# Patient Record
Sex: Female | Born: 1935 | Race: White | Hispanic: No | Smoking: Current every day smoker
Health system: Southern US, Community
[De-identification: ages and names within clinical notes are randomized; demographics above are authoritative.]

## PROBLEM LIST (undated history)

## (undated) DIAGNOSIS — F329 Major depressive disorder, single episode, unspecified: Secondary | ICD-10-CM

## (undated) DIAGNOSIS — I219 Acute myocardial infarction, unspecified: Secondary | ICD-10-CM

## (undated) DIAGNOSIS — I2510 Atherosclerotic heart disease of native coronary artery without angina pectoris: Secondary | ICD-10-CM

## (undated) DIAGNOSIS — Z951 Presence of aortocoronary bypass graft: Secondary | ICD-10-CM

## (undated) DIAGNOSIS — H3530 Unspecified macular degeneration: Secondary | ICD-10-CM

## (undated) DIAGNOSIS — R569 Unspecified convulsions: Secondary | ICD-10-CM

## (undated) DIAGNOSIS — J45909 Unspecified asthma, uncomplicated: Secondary | ICD-10-CM

## (undated) DIAGNOSIS — M1990 Unspecified osteoarthritis, unspecified site: Secondary | ICD-10-CM

## (undated) DIAGNOSIS — E785 Hyperlipidemia, unspecified: Secondary | ICD-10-CM

## (undated) DIAGNOSIS — R0902 Hypoxemia: Secondary | ICD-10-CM

## (undated) DIAGNOSIS — G40909 Epilepsy, unspecified, not intractable, without status epilepticus: Secondary | ICD-10-CM

## (undated) DIAGNOSIS — I10 Essential (primary) hypertension: Secondary | ICD-10-CM

## (undated) DIAGNOSIS — R251 Tremor, unspecified: Secondary | ICD-10-CM

## (undated) DIAGNOSIS — J449 Chronic obstructive pulmonary disease, unspecified: Secondary | ICD-10-CM

## (undated) DIAGNOSIS — E538 Deficiency of other specified B group vitamins: Secondary | ICD-10-CM

## (undated) DIAGNOSIS — K219 Gastro-esophageal reflux disease without esophagitis: Secondary | ICD-10-CM

## (undated) DIAGNOSIS — Z955 Presence of coronary angioplasty implant and graft: Secondary | ICD-10-CM

## (undated) DIAGNOSIS — F32A Depression, unspecified: Secondary | ICD-10-CM

## (undated) HISTORY — DX: Unspecified macular degeneration: H35.30

## (undated) HISTORY — DX: Depression, unspecified: F32.A

## (undated) HISTORY — PX: CORONARY ANGIOPLASTY WITH STENT PLACEMENT: SHX49

## (undated) HISTORY — PX: APPENDECTOMY: SHX54

## (undated) HISTORY — DX: Epilepsy, unspecified, not intractable, without status epilepticus: G40.909

## (undated) HISTORY — DX: Atherosclerotic heart disease of native coronary artery without angina pectoris: I25.10

## (undated) HISTORY — PX: ABDOMINAL HYSTERECTOMY: SHX81

## (undated) HISTORY — DX: Hypoxemia: R09.02

## (undated) HISTORY — PX: CARDIAC SURGERY: SHX584

## (undated) HISTORY — DX: Deficiency of other specified B group vitamins: E53.8

## (undated) HISTORY — DX: Tremor, unspecified: R25.1

## (undated) HISTORY — DX: Major depressive disorder, single episode, unspecified: F32.9

## (undated) HISTORY — DX: Essential (primary) hypertension: I10

## (undated) HISTORY — DX: Unspecified osteoarthritis, unspecified site: M19.90

## (undated) HISTORY — PX: ROTATOR CUFF REPAIR: SHX139

## (undated) HISTORY — DX: Gastro-esophageal reflux disease without esophagitis: K21.9

## (undated) HISTORY — DX: Hyperlipidemia, unspecified: E78.5

---

## 1960-02-13 HISTORY — PX: CHOLECYSTECTOMY: SHX55

## 2000-02-13 HISTORY — PX: VENTRAL HERNIA REPAIR: SHX424

## 2005-08-13 ENCOUNTER — Other Ambulatory Visit: Payer: Self-pay

## 2005-08-13 ENCOUNTER — Emergency Department: Payer: Self-pay | Admitting: Emergency Medicine

## 2006-03-20 ENCOUNTER — Ambulatory Visit: Payer: Self-pay | Admitting: Ophthalmology

## 2006-03-26 ENCOUNTER — Ambulatory Visit: Payer: Self-pay | Admitting: Ophthalmology

## 2008-04-22 ENCOUNTER — Inpatient Hospital Stay: Payer: Self-pay | Admitting: Internal Medicine

## 2009-07-15 ENCOUNTER — Ambulatory Visit: Payer: Self-pay | Admitting: Family Medicine

## 2009-07-29 ENCOUNTER — Ambulatory Visit: Payer: Self-pay | Admitting: Family Medicine

## 2011-10-22 DIAGNOSIS — Z23 Encounter for immunization: Secondary | ICD-10-CM | POA: Insufficient documentation

## 2012-09-04 LAB — HM DEXA SCAN

## 2013-05-13 HISTORY — PX: CARDIAC CATHETERIZATION: SHX172

## 2013-06-05 DIAGNOSIS — T420X1A Poisoning by hydantoin derivatives, accidental (unintentional), initial encounter: Secondary | ICD-10-CM | POA: Insufficient documentation

## 2013-06-16 DIAGNOSIS — R0902 Hypoxemia: Secondary | ICD-10-CM | POA: Insufficient documentation

## 2014-02-18 DIAGNOSIS — R251 Tremor, unspecified: Secondary | ICD-10-CM | POA: Insufficient documentation

## 2016-02-05 ENCOUNTER — Encounter: Payer: Self-pay | Admitting: Emergency Medicine

## 2016-02-05 ENCOUNTER — Emergency Department: Payer: Medicare HMO

## 2016-02-05 ENCOUNTER — Emergency Department
Admission: EM | Admit: 2016-02-05 | Discharge: 2016-02-05 | Disposition: A | Discharge status: Home / Self Care | Payer: Medicare HMO | Attending: Emergency Medicine | Admitting: Emergency Medicine

## 2016-02-05 DIAGNOSIS — R1310 Dysphagia, unspecified: Secondary | ICD-10-CM | POA: Diagnosis not present

## 2016-02-05 DIAGNOSIS — J449 Chronic obstructive pulmonary disease, unspecified: Secondary | ICD-10-CM | POA: Insufficient documentation

## 2016-02-05 DIAGNOSIS — Z87891 Personal history of nicotine dependence: Secondary | ICD-10-CM | POA: Diagnosis not present

## 2016-02-05 HISTORY — DX: Unspecified convulsions: R56.9

## 2016-02-05 HISTORY — DX: Chronic obstructive pulmonary disease, unspecified: J44.9

## 2016-02-05 HISTORY — DX: Presence of coronary angioplasty implant and graft: Z95.5

## 2016-02-05 HISTORY — DX: Presence of aortocoronary bypass graft: Z95.1

## 2016-02-05 HISTORY — DX: Acute myocardial infarction, unspecified: I21.9

## 2016-02-05 NOTE — ED Notes (Signed)

## 2016-02-05 NOTE — ED Notes (Signed)
Pt able to tolerate PO fluids at this time. Pt refuses food at this time, states she feels like the pill is gone and she just wants to go home. EDP notified.

## 2016-02-05 NOTE — ED Triage Notes (Addendum)
Pt presents to ED via [facility] from home. Pt states she took an Excedrin this morning and feels like it's stuck in her throat. States she usually crushes her pills but did not this morning. States she is unable to swallow water now and just spits it back up. O2 sat 97% on RA. No c/o difficulty breathing. Pt is not spitting out her saliva at this time.

## 2016-02-05 NOTE — ED Provider Notes (Signed)
[facility] Emergency Department Provider Note  ____________________________________________  Time seen: Approximately 9:03 AM  I have reviewed the triage vital signs and the nursing notes.   HISTORY  Chief Complaint Dysphagia    HPI Sandy Daniel is a 82 y.o. female who complains of foreign body sensation in her throat. She states that about 8:00 AM today she took an Excedrin, and feels like it got stuck. She feels the sensation right around the sternal notch in her throat. Normally she crushes her medications but she did not crush this pill. Otherwise in her usual state of health. No vomiting. She feels like she cannot swallow liquids. No chest pain or shortness of breath.     Past Medical History:  Diagnosis Date  . COPD (chronic obstructive pulmonary disease) (HCC)   . MI (myocardial infarction)   . Presence of stent of CABG    x2   . Seizures (HCC)      There are no active problems to display for this patient.    Past Surgical History:  Procedure Laterality Date  . CARDIAC SURGERY       Prior to Admission medications   Not on File   Allergies   Active Allergy Reactions Severity Noted Date Comments  Cortisone Rash Medium 07/13/2013   Fosamax Nausea And Vomiting Medium 01/15/2013   Ibuprofen   11/09/2010 Cause her to have blisters in her mouth   Nsaids Rash High 02/23/2013   Other Rash Medium 07/21/2013 Any Steriods. Causes mouth sores    Current Medications   Prescription Sig. Disp. Refills Start Date End Date Status  aspirin (ASPIRIN) 81 mg EC tablet  Take 81 mg by mouth daily.     Active  Cholecalciferol (VITAMIN D PO)  Take by mouth. 1000 units QD     Active  Probiotic Product (ALIGN) 4 MG CAPS  Take 1 capsule by mouth daily. 30 capsule  6 07/07/2013  Active  ipratropium-albuterol (COMBIVENT RESPIMAT) 20-100 MCG/ACT inhaler  Inhale 1 puff into the lungs 4 (four) times a day as needed. 1 Inhaler  11  05/24/2014  Active  nitroGLYCERIN (NITROSTAT) 0.4 mg SL tablet  Place 0.4 mg under the tongue every 5 (five) minutes as needed for Chest pain.     Active  PARoxetine HCl (PAXIL) 20 mg tablet  TAKE ONE TABLET BY MOUTH IN THE MORNING 90 tablet  1 11/23/2014  Active  butalbital-acetaminophen-caffeine (FIORICET, ESGIC) 50-325-40 mg per tablet  Indications: Tension Headache Take one tablet by mouth 2 (two) times a day as needed for Pain. 30 tablet  5 02/21/2015  Active  levETIRAcetam (KEPPRA) 500 mg tablet  Take one tablet (500 mg total) by mouth 2 (two) times daily. 720 tablet  0 04/11/2015 04/10/2016 Active  tiotropium-olodaterol (STIOLTO RESPIMAT) 2.5-2.5 mcg/actuation inhaler  Inhale 1 puff into the lungs daily.     Active  ondansetron (ZOFRAN) 4 mg tablet  Take 4 mg by mouth every 8 (eight) hours as needed for Nausea.     Active  Magnesium Hydroxide (MILK OF MAGNESIA PO)  Take by mouth as needed.      Active  lidocaine (LIDODERM) 5%  Place 1 patch onto the skin every 24 (twenty-four) hours as needed. Remove & Discard patch within 12 hours or as directed by MD      Active  Saline (DEEP SEA NASAL SPRAY NA)  by Nasal route.     Active  pravastatin sodium (PRAVACHOL) 20 mg tablet  Take one tablet (20 mg  total) by mouth daily. 30 tablet  2 06/06/2015  Active  famotidine (PEPCID) 20 MG tablet  Take one tablet (20 mg total) by mouth 2 (two) times daily. 60 tablet  2 06/06/2015  Active  ALPRAZolam (XANAX) 0.25 mg tablet  Take 0.25 mg by mouth 3 (three) times a day as needed.    05/05/2015  Active  ketoconazole (NIZORAL) 2 % cream  Apply topically daily.    06/02/2015  Active  phenytoin sodium (DILANTIN) 100 mg ER capsule  Take two capsules (200 mg total) by mouth at bedtime. 180 capsule  1 06/16/2015  Active  clobetasol (TEMOVATE) 0.05% external solution  Indications: Dermatitis USE ONE APPLICATION TOPICALLY TO AFFECTED AREA (S) TWICE DAILY AS NEEDED  FOR ITCHING. 50 mL  6 07/13/2015  Active  nystatin (MYCOSTATIN) 100000 UNIT/ML suspension  Indications: Dermatitis SWISH AND SWALLOW 5 ML'S BY MOUTH FOUR TIMES A DAY 180 mL  1 07/13/2015  Active  nystatin (MYCOSTATIN) cream  Apply topically 2 (two) times daily. 60 g  1 07/15/2015 07/14/2016 Active  carvedilol (COREG) 3.125 mg tablet  TAKE ONE TABLET BY MOUTH TWICE DAILY 60 tablet  2 07/21/2015  Active  oxyCODONE HCl (ROXICODONE) 5 mg immediate release tablet  Take one tablet (5 mg total) by mouth every 8 (eight) hours as needed for Pain. 30 tablet  0 07/27/2015  Active  Misc. Devices MISC  Sandy Daniel,DOB July 14, 2028, is to be admitted to skilled nursing facility on 08[facility]12[facility]: H35.30, J44.9, G40.909 1 each  0 08/11/2015  Active  Misc. Devices MISC  Sandy Daniel to be admitted to skilled nursing facility on August 12, 2015. Follow medications and diet per FL[facility] dated July 27, 2015.          Allergies Motrin [ibuprofen]   History reviewed. No pertinent family history.  Social History Social History  Substance Use Topics  . Smoking status: Former Smoker    Years: 50.00  . Smokeless tobacco: Never Used  . Alcohol use No    Review of Systems  Constitutional:   No fever or chills.  ENT:   Positive throat pain and foreign body sensation. Cardiovascular:   No chest pain. Respiratory:   No dyspnea or cough. Gastrointestinal:   Negative for abdominal pain, vomiting and diarrhea.  Genitourinary:   Negative for dysuria or difficulty urinating. Musculoskeletal:   Negative for focal pain or swelling Neurological:   Negative for headaches 10-point ROS otherwise negative.  ____________________________________________   PHYSICAL EXAM:  VITAL SIGNS: ED Triage Vitals  Enc Vitals Group     BP 02/05/16 0821 (!) 145/88     Pulse Rate 02/05/16 0821 76     Resp 02/05/16 0821 20     Temp 02/05/16 0821 97.8 F (36.6 C)     Temp Source  02/05/16 0821 Oral     SpO2 02/05/16 0821 96 %     Weight 02/05/16 0822 191 lb 3.2 oz (86.7 kg)     Height 02/05/16 0822 5\' 1"  (1.549 m)     Head Circumference --      Peak Flow --      Pain Score --      Pain Loc --      Pain Edu? --      Excl. in GC? --     Vital signs reviewed, nursing assessments reviewed.   Constitutional:   Alert and oriented. Well appearing and in no distress. Eyes:   No scleral icterus. No conjunctival  pallor. PERRL. EOMI.  No nystagmus. ENT   Head:   Normocephalic and atraumatic.   Nose:   No congestion/rhinnorhea. No septal hematoma   Mouth/Throat:   MMM, no pharyngeal erythema. No peritonsillar mass. No erythema or swelling or visualized foreign body in the oropharynx patient oropharynx visualized as inferiorly as possible with patient in the sniffing position, unable to see the epiglottis.   Neck:   No stridor. No SubQ emphysema. No meningismus. Hematological/Lymphatic/Immunilogical:   No cervical lymphadenopathy. Cardiovascular:   RRR. Symmetric bilateral radial and DP pulses.  No murmurs.  Respiratory:   Normal respiratory effort without tachypnea nor retractions. Breath sounds are clear and equal bilaterally. No wheezes/rales/rhonchi. Gastrointestinal:   Soft and nontender. Non distended. There is no CVA tenderness.  No rebound, rigidity, or guarding. Genitourinary:   deferred Musculoskeletal:   Nontender with normal range of motion in all extremities. No joint effusions.  No lower extremity tenderness.  No edema. Neurologic:   Normal speech and language.  CN 2-10 normal. Motor grossly intact. No gross focal neurologic deficits are appreciated.  Skin:    Skin is warm, dry and intact. No rash noted.  No petechiae, purpura, or bullae.  ____________________________________________    LABS (pertinent positives/negatives) (all labs ordered are listed, but only abnormal results are displayed) Labs Reviewed - No data to  display ____________________________________________   EKG    ____________________________________________    RADIOLOGY  X-rays soft tissue neck unremarkable X-ray chest unremarkable  ____________________________________________   PROCEDURES Procedures  ____________________________________________   INITIAL IMPRESSION / ASSESSMENT AND PLAN / ED COURSE  Pertinent labs & imaging results that were available during my care of the patient were reviewed by me and considered in my medical decision making (see chart for details).  Patient presents to foreign body sensation after swallowing a pill. We'll get x-rays, and 2 by mouth trial to try and resolve any obstruction.     Clinical Course    ___________________  ----------------------------------------- 10:37 AM on 02/05/2016 -----------------------------------------  Patient feels better. Tolerating oral intake. We'll discharge home.   _________________________   FINAL CLINICAL IMPRESSION(S) / ED DIAGNOSES  Final diagnoses:  Dysphagia, unspecified type      New Prescriptions   No medications on file     Portions of this note were generated with dragon dictation software. Dictation errors may occur despite best attempts at proofreading.    Sharman Cheek, MD 02/05/16 1037

## 2016-02-05 NOTE — ED Notes (Signed)
Pt given applesauce per family request.

## 2016-02-17 DIAGNOSIS — E782 Mixed hyperlipidemia: Secondary | ICD-10-CM

## 2016-02-17 DIAGNOSIS — I10 Essential (primary) hypertension: Secondary | ICD-10-CM

## 2016-02-17 DIAGNOSIS — F32A Depression, unspecified: Secondary | ICD-10-CM | POA: Insufficient documentation

## 2016-02-17 DIAGNOSIS — I219 Acute myocardial infarction, unspecified: Secondary | ICD-10-CM | POA: Insufficient documentation

## 2016-02-17 DIAGNOSIS — I2510 Atherosclerotic heart disease of native coronary artery without angina pectoris: Secondary | ICD-10-CM | POA: Insufficient documentation

## 2016-02-17 DIAGNOSIS — J449 Chronic obstructive pulmonary disease, unspecified: Secondary | ICD-10-CM | POA: Insufficient documentation

## 2016-02-17 DIAGNOSIS — H3530 Unspecified macular degeneration: Secondary | ICD-10-CM

## 2016-02-17 DIAGNOSIS — F329 Major depressive disorder, single episode, unspecified: Secondary | ICD-10-CM | POA: Insufficient documentation

## 2016-02-17 DIAGNOSIS — E785 Hyperlipidemia, unspecified: Secondary | ICD-10-CM | POA: Insufficient documentation

## 2016-02-17 DIAGNOSIS — K219 Gastro-esophageal reflux disease without esophagitis: Secondary | ICD-10-CM | POA: Insufficient documentation

## 2016-02-17 DIAGNOSIS — M1990 Unspecified osteoarthritis, unspecified site: Secondary | ICD-10-CM | POA: Insufficient documentation

## 2016-02-17 DIAGNOSIS — E538 Deficiency of other specified B group vitamins: Secondary | ICD-10-CM

## 2016-02-17 DIAGNOSIS — G40909 Epilepsy, unspecified, not intractable, without status epilepticus: Secondary | ICD-10-CM | POA: Insufficient documentation

## 2016-03-08 ENCOUNTER — Encounter: Payer: Self-pay | Admitting: Urgent Care

## 2016-03-08 DIAGNOSIS — I2510 Atherosclerotic heart disease of native coronary artery without angina pectoris: Secondary | ICD-10-CM | POA: Diagnosis not present

## 2016-03-08 DIAGNOSIS — I252 Old myocardial infarction: Secondary | ICD-10-CM | POA: Insufficient documentation

## 2016-03-08 DIAGNOSIS — J45909 Unspecified asthma, uncomplicated: Secondary | ICD-10-CM | POA: Diagnosis not present

## 2016-03-08 DIAGNOSIS — J449 Chronic obstructive pulmonary disease, unspecified: Secondary | ICD-10-CM | POA: Diagnosis not present

## 2016-03-08 DIAGNOSIS — R1013 Epigastric pain: Secondary | ICD-10-CM | POA: Diagnosis present

## 2016-03-08 DIAGNOSIS — I10 Essential (primary) hypertension: Secondary | ICD-10-CM | POA: Insufficient documentation

## 2016-03-08 DIAGNOSIS — Z87891 Personal history of nicotine dependence: Secondary | ICD-10-CM | POA: Diagnosis not present

## 2016-03-08 DIAGNOSIS — K5730 Diverticulosis of large intestine without perforation or abscess without bleeding: Secondary | ICD-10-CM | POA: Insufficient documentation

## 2016-03-08 LAB — URINALYSIS, COMPLETE (UACMP) WITH MICROSCOPIC
Bilirubin Urine: NEGATIVE
Glucose, UA: NEGATIVE mg/dL
Hgb urine dipstick: NEGATIVE
KETONES UR: NEGATIVE mg/dL
Leukocytes, UA: NEGATIVE
Nitrite: NEGATIVE
Protein, ur: NEGATIVE mg/dL
Specific Gravity, Urine: 1.003 — ABNORMAL LOW (ref 1.005–1.030)
pH: 7 (ref 5.0–8.0)

## 2016-03-08 LAB — CBC
HCT: 36 % (ref 35.0–47.0)
HEMOGLOBIN: 12.4 g/dL (ref 12.0–16.0)
MCH: 30.7 pg (ref 26.0–34.0)
MCHC: 34.4 g/dL (ref 32.0–36.0)
MCV: 89.3 fL (ref 80.0–100.0)
Platelets: 291 10*3/uL (ref 150–440)
RBC: 4.03 MIL/uL (ref 3.80–5.20)
RDW: 14.1 % (ref 11.5–14.5)
WBC: 5.2 10*3/uL (ref 3.6–11.0)

## 2016-03-08 LAB — COMPREHENSIVE METABOLIC PANEL
ALK PHOS: 159 U/L — AB (ref 38–126)
ALT: 14 U/L (ref 14–54)
AST: 23 U/L (ref 15–41)
Albumin: 3.8 g/dL (ref 3.5–5.0)
Anion gap: 9 (ref 5–15)
BUN: 16 mg/dL (ref 6–20)
CALCIUM: 8.8 mg/dL — AB (ref 8.9–10.3)
CO2: 26 mmol/L (ref 22–32)
CREATININE: 0.77 mg/dL (ref 0.44–1.00)
Chloride: 103 mmol/L (ref 101–111)
GFR calc non Af Amer: >60 mL/min (ref >60)
GLUCOSE: 104 mg/dL — AB (ref 65–99)
Potassium: 3.6 mmol/L (ref 3.5–5.1)
SODIUM: 138 mmol/L (ref 135–145)
Total Bilirubin: 0.3 mg/dL (ref 0.3–1.2)
Total Protein: 7.6 g/dL (ref 6.5–8.1)

## 2016-03-08 LAB — TROPONIN I

## 2016-03-08 LAB — LIPASE, BLOOD: Lipase: 25 U/L (ref 11–51)

## 2016-03-08 NOTE — ED Triage Notes (Addendum)
Patient presents with c/o upper abdominal pain and swelling that began x 4 days ago; worse yesterday. (+) nausea reported. Patient reports that the pain is under both of her breasts. (+) generalized weakness and a headache also reported.

## 2016-03-09 ENCOUNTER — Encounter: Payer: Self-pay | Admitting: Radiology

## 2016-03-09 ENCOUNTER — Emergency Department
Admission: EM | Admit: 2016-03-09 | Discharge: 2016-03-09 | Disposition: A | Discharge status: Home / Self Care | Payer: Medicare HMO | Attending: Emergency Medicine | Admitting: Emergency Medicine

## 2016-03-09 ENCOUNTER — Emergency Department: Payer: Medicare HMO

## 2016-03-09 DIAGNOSIS — R197 Diarrhea, unspecified: Secondary | ICD-10-CM

## 2016-03-09 DIAGNOSIS — K5730 Diverticulosis of large intestine without perforation or abscess without bleeding: Secondary | ICD-10-CM

## 2016-03-09 HISTORY — DX: Unspecified asthma, uncomplicated: J45.909

## 2016-03-09 MED ORDER — ONDANSETRON HCL 4 MG/2ML IJ SOLN
4.0000 mg | Freq: Once | INTRAMUSCULAR | Status: AC
  Administered 2016-03-09: 4 mg via INTRAVENOUS
  Filled 2016-03-09: qty 2

## 2016-03-09 MED ORDER — IOPAMIDOL (ISOVUE-300) INJECTION 61%
100.0000 mL | Freq: Once | INTRAVENOUS | Status: AC | PRN
  Administered 2016-03-09: 100 mL via INTRAVENOUS

## 2016-03-09 MED ORDER — IOPAMIDOL (ISOVUE-300) INJECTION 61%
30.0000 mL | Freq: Once | INTRAVENOUS | Status: AC | PRN
  Administered 2016-03-09: 30 mL via ORAL

## 2016-03-09 MED ORDER — MORPHINE SULFATE (PF) 2 MG/ML IV SOLN
2.0000 mg | Freq: Once | INTRAVENOUS | Status: AC
  Administered 2016-03-09: 2 mg via INTRAVENOUS
  Filled 2016-03-09: qty 1

## 2016-03-09 NOTE — ED Provider Notes (Signed)
[facility] Emergency Department Provider Note    First MD Initiated Contact with Patient 03/09/16 [phone]     (approximate)  I have reviewed the triage vital signs and the nursing notes.   HISTORY  Chief Complaint Abdominal Pain    HPI Sandy Daniel is a 83 y.o. female with bolus of chronic medical conditions presents to the emergency department with 4 day history of epigastric/lower quadrant abdominal pain. Patient denies any fever does however admit to nausea no vomiting. Patient denies any diarrhea or constipation.   Past Medical History:  Diagnosis Date  . Arthritis   . Asthma   . B12 deficiency   . CAD (coronary artery disease)   . COPD (chronic obstructive pulmonary disease) (HCC)   . Depression   . Epilepsy (HCC)   . GERD (gastroesophageal reflux disease)   . Hyperlipidemia   . Hypertension   . Hypoxemia   . Macular degeneration (senile) of retina   . Macular degeneration of both eyes   . MI (myocardial infarction) 09/2003, 05/2013  . Physiological tremor   . Presence of stent of CABG    x2   . Seizures [facility])     Patient Active Problem List   Diagnosis Date Noted  . CAD (coronary artery disease)   . Epilepsy (HCC)   . Macular degeneration (senile) of retina   . Macular degeneration of both eyes   . B12 deficiency   . COPD (chronic obstructive pulmonary disease) (HCC)   . Hypertension   . Hyperlipidemia   . Arthritis   . Depression   . GERD (gastroesophageal reflux disease)   . MI (myocardial infarction)     Past Surgical History:  Procedure Laterality Date  . ABDOMINAL HYSTERECTOMY    . APPENDECTOMY    . CARDIAC SURGERY    . CHOLECYSTECTOMY  1962  . CORONARY ANGIOPLASTY WITH STENT PLACEMENT  Aug. 2005 and March 2015  . ROTATOR CUFF REPAIR Right   . VENTRAL HERNIA REPAIR  2002    Prior to Admission medications   Not on File    Allergies Motrin [ibuprofen]  Family History  Problem Relation Age of Onset  .  Diabetes Mother   . Cancer Sister   . Diabetes Sister   . Heart disease Sister   . Heart attack Sister   . Hyperlipidemia Sister   . Hypertension Sister   . Cancer Brother   . Diabetes Brother   . Heart disease Brother   . Heart attack Brother   . Hyperlipidemia Brother   . Hypertension Brother     Social History Social History  Substance Use Topics  . Smoking status: Former Smoker    Years: 50.00  . Smokeless tobacco: Never Used  . Alcohol use No    Review of Systems Constitutional: No fever/chills Eyes: No visual changes. ENT: No sore throat. Cardiovascular: Denies chest pain. Respiratory: Denies shortness of breath. Gastrointestinal: Positive for abdominal pain and nausea Genitourinary: Negative for dysuria. Musculoskeletal: Negative for back pain. Skin: Negative for rash. Neurological: Negative for headaches, focal weakness or numbness.  10-point ROS otherwise negative.  ____________________________________________   PHYSICAL EXAM:  VITAL SIGNS: ED Triage Vitals  Enc Vitals Group     BP 03/08/16 2141 (!) 135/56     Pulse Rate 03/08/16 2141 64     Resp 03/08/16 2141 18     Temp 03/08/16 2141 98.1 F (36.7 C)     Temp Source 03/08/16 2141 Oral  SpO2 03/08/16 2141 94 %     Weight 03/08/16 2137 187 lb (84.8 kg)     Height 03/08/16 2137 5\' 1"  (1.549 m)     Head Circumference --      Peak Flow --      Pain Score 03/08/16 2137 9     Pain Loc --      Pain Edu? --      Excl. in GC? --     Constitutional: Alert and oriented. Well appearing and in no acute distress. Eyes: Conjunctivae are normal. PERRL. EOMI. Head: Atraumatic. Mouth/Throat: Mucous membranes are moist.  Oropharynx non-erythematous. Neck: No stridor.   Cardiovascular: Normal rate, regular rhythm. Good peripheral circulation. Grossly normal heart sounds. Respiratory: Normal respiratory effort.  No retractions. Lungs CTAB. Gastrointestinal: Epigastric/left upper quadrant tenderness to  palpation No distention.  Musculoskeletal: No lower extremity tenderness nor edema. No gross deformities of extremities. Neurologic:  Normal speech and language. No gross focal neurologic deficits are appreciated.  Skin:  Skin is warm, dry and intact. No rash noted.   ____________________________________________   LABS (all labs ordered are listed, but only abnormal results are displayed)  Labs Reviewed  COMPREHENSIVE METABOLIC PANEL - Abnormal; Notable for the following:       Result Value   Glucose, Bld 104 (*)    Calcium 8.8 (*)    Alkaline Phosphatase 159 (*)    All other components within normal limits  URINALYSIS, COMPLETE (UACMP) WITH MICROSCOPIC - Abnormal; Notable for the following:    Color, Urine COLORLESS (*)    APPearance CLEAR (*)    Specific Gravity, Urine 1.003 (*)    Bacteria, UA MANY (*)    Squamous Epithelial / LPF 6-30 (*)    All other components within normal limits  LIPASE, BLOOD  CBC  TROPONIN I   ____________________________________________  EKG  ED ECG REPORT I, Amaya N BROWN, the attending physician, personally viewed and interpreted this ECG.   Date: 03/09/2016  EKG Time: 9:42 PM  Rate: 62  Rhythm: Normal sinus rhythm with PACs  Axis: Normal  Intervals: Irregular PR interval  ST&T Change: None  ____________________________________________  RADIOLOGY I, Lenwood N BROWN, personally viewed and evaluated these images (plain radiographs) as part of my medical decision making, as well as reviewing the written report by the radiologist.  Ct Abdomen Pelvis W Contrast  Result Date: 03/09/2016 CLINICAL DATA:  83 year old female with history of mesh implant and appendectomy presenting with upper abdominal pain for past 4 days. Patient reports the pain is under both breasts. EXAM: CT ABDOMEN AND PELVIS WITH CONTRAST TECHNIQUE: Multidetector CT imaging of the abdomen and pelvis was performed using the standard protocol following bolus  administration of intravenous contrast. CONTRAST:  ISOVUE-300 IOPAMIDOL (ISOVUE-300) INJECTION 61% COMPARISON:  Abdominal CT dated 07/29/2009 FINDINGS: Lower chest: There is moderate cardiomegaly with biatrial enlargement. Multi vessel coronary vascular calcification as well as calcification of the mitral annulus. There are bibasilar linear atelectasis/ scarring. There is no intra-abdominal free air or free fluid. Hepatobiliary: Cholecystectomy. There is mild intrahepatic biliary ductal dilatation, likely post cholecystectomy. Subcentimeter right hepatic hypodense lesion (series 2, image 26) is too small to characterize but likely represents a cyst or hemangioma. The liver is otherwise unremarkable. Pancreas: Unremarkable. No pancreatic ductal dilatation or surrounding inflammatory changes. Spleen: Normal in size without focal abnormality. Adrenals/Urinary Tract: The adrenal glands appear unremarkable. There is mild bilateral renal parenchymal atrophy with cortical irregularity more prominent on the left. There is a 5.5  cm right renal interpolar cyst. A 1.5 cm hypodense lesion in the upper pole of the right kidney superior to the mid pole cyst (series 7 image 20) is indeterminate but likely represents a cyst as well. There is no hydronephrosis on either side. The visualized ureters and urinary bladder appear unremarkable. Stomach/Bowel: There is a small hiatal hernia. Oral contrast opacifies the stomach and multiple loops of small bowel traversing into the cecum. Loose stool noted throughout the colon compatible with diarrheal state. Correlation with clinical exam and stool cultures recommended. There is sigmoid diverticulosis without active inflammatory changes. There is postsurgical changes of partial sigmoid resection with anastomotic sutures. There is no evidence of bowel obstruction or active inflammation. Appendectomy. Vascular/Lymphatic: There is advanced aortoiliac atherosclerotic disease. The origins  of the celiac axis, SMA, IMA and the renal arteries remain patent. The SMV, splenic vein, and main portal vein are patent. No portal venous gas identified. There is a retroaortic left renal vein anatomy. There is no adenopathy. Reproductive: Hysterectomy. Other: Midline vertical anterior abdominal wall incisional scar. A ventral hernia repair mesh is noted. No inflammatory changes or fluid collection. No evidence of recurrent hernia. Musculoskeletal: There is osteopenia with multilevel degenerative changes of the spine. Multilevel disc desiccation with vacuum phenomena noted. Mild thoracolumbar levoscoliosis. No acute fracture. IMPRESSION: Diarrheal state. Correlation with clinical exam and stool cultures recommended. No evidence of bowel obstruction or active inflammation. Sigmoid diverticulosis. Cardiomegaly with multi vessel coronary vascular calcification. Aortoiliac atherosclerotic disease. Electronically Signed   By: Elgie Collard M.D.   On: 03/09/2016 04:38    _____ Procedures    INITIAL IMPRESSION / ASSESSMENT AND PLAN / ED COURSE  Pertinent labs & imaging results that were available during my care of the patient were reviewed by me and considered in my medical decision making (see chart for details).  83 year old female presenting emergency department for a history of abdominal pain and swelling company by nausea. CT scan of the abdomen and pelvis revealed diarrheal state as well as diverticulosis. Patient admits to taking laxatives every other day since undergoing hernia surgery. Patient is advised to discontinue laxative use unless constipated Patient pain completely resolve.      ____________________________________________  FINAL CLINICAL IMPRESSION(S) / ED DIAGNOSES  Final diagnoses:  Diarrhea, unspecified type  Diverticulosis of large intestine without hemorrhage     MEDICATIONS GIVEN DURING THIS VISIT:  Medications  morphine 2 MG/ML injection 2 mg (2 mg Intravenous  Given 03/09/16 0138)  ondansetron (ZOFRAN) injection 4 mg (4 mg Intravenous Given 03/09/16 0137)  iopamidol (ISOVUE-300) 61 % injection 30 mL (30 mLs Oral Contrast Given 03/09/16 0346)  ondansetron (ZOFRAN) injection 4 mg (4 mg Intravenous Given 03/09/16 0317)  iopamidol (ISOVUE-300) 61 % injection 100 mL (100 mLs Intravenous Contrast Given 03/09/16 0345)     NEW OUTPATIENT MEDICATIONS STARTED DURING THIS VISIT:  New Prescriptions   No medications on file    Modified Medications   No medications on file    Discontinued Medications   No medications on file     Note:  This document was prepared using Dragon voice recognition software and may include unintentional dictation errors.    Darci Current, MD 03/09/16 [phone]

## 2016-03-15 HISTORY — PX: CATARACT EXTRACTION: SUR2

## 2016-03-26 ENCOUNTER — Ambulatory Visit (INDEPENDENT_AMBULATORY_CARE_PROVIDER_SITE_OTHER): Payer: Medicare HMO | Admitting: Family Medicine

## 2016-03-26 ENCOUNTER — Encounter: Payer: Self-pay | Admitting: Family Medicine

## 2016-03-26 VITALS — BP 114/72 | HR 79 | Temp 98.4°F | Resp 18 | Ht 59.0 in | Wt 174.0 lb

## 2016-03-26 DIAGNOSIS — M1990 Unspecified osteoarthritis, unspecified site: Secondary | ICD-10-CM | POA: Diagnosis not present

## 2016-03-26 DIAGNOSIS — I10 Essential (primary) hypertension: Secondary | ICD-10-CM

## 2016-03-26 DIAGNOSIS — E538 Deficiency of other specified B group vitamins: Secondary | ICD-10-CM | POA: Diagnosis not present

## 2016-03-26 DIAGNOSIS — G40909 Epilepsy, unspecified, not intractable, without status epilepticus: Secondary | ICD-10-CM

## 2016-03-26 DIAGNOSIS — K219 Gastro-esophageal reflux disease without esophagitis: Secondary | ICD-10-CM | POA: Diagnosis not present

## 2016-03-26 DIAGNOSIS — I25119 Atherosclerotic heart disease of native coronary artery with unspecified angina pectoris: Secondary | ICD-10-CM

## 2016-03-26 DIAGNOSIS — R1310 Dysphagia, unspecified: Secondary | ICD-10-CM

## 2016-03-26 DIAGNOSIS — F329 Major depressive disorder, single episode, unspecified: Secondary | ICD-10-CM

## 2016-03-26 DIAGNOSIS — F32A Depression, unspecified: Secondary | ICD-10-CM

## 2016-03-26 DIAGNOSIS — E782 Mixed hyperlipidemia: Secondary | ICD-10-CM | POA: Diagnosis not present

## 2016-03-26 DIAGNOSIS — F410 Panic disorder [episodic paroxysmal anxiety] without agoraphobia: Secondary | ICD-10-CM

## 2016-03-26 DIAGNOSIS — H3530 Unspecified macular degeneration: Secondary | ICD-10-CM | POA: Diagnosis not present

## 2016-03-26 DIAGNOSIS — J449 Chronic obstructive pulmonary disease, unspecified: Secondary | ICD-10-CM | POA: Diagnosis not present

## 2016-03-26 MED ORDER — CARVEDILOL 3.125 MG PO TABS: 3.1250 mg | ORAL_TABLET | Freq: Every day | ORAL | 1 refills | Status: DC

## 2016-03-26 MED ORDER — QUETIAPINE FUMARATE 25 MG PO TABS: 25.0000 mg | ORAL_TABLET | Freq: Three times a day (TID) | ORAL | 1 refills | Status: DC

## 2016-03-26 MED ORDER — ALBUTEROL SULFATE (2.5 MG/3ML) 0.083% IN NEBU: INHALATION_SOLUTION | RESPIRATORY_TRACT | 3 refills | Status: DC

## 2016-03-26 MED ORDER — PHENYTOIN SODIUM EXTENDED 100 MG PO CAPS: 200.0000 mg | ORAL_CAPSULE | Freq: Every day | ORAL | 1 refills | Status: DC

## 2016-03-26 MED ORDER — FAMOTIDINE 20 MG PO TABS: 20.0000 mg | ORAL_TABLET | Freq: Every day | ORAL | 1 refills | Status: DC

## 2016-03-26 MED ORDER — NITROGLYCERIN 0.4 MG SL SUBL: 0.4000 mg | SUBLINGUAL_TABLET | SUBLINGUAL | 5 refills | Status: DC | PRN

## 2016-03-26 MED ORDER — SERTRALINE HCL 50 MG PO TABS: 50.0000 mg | ORAL_TABLET | Freq: Every day | ORAL | 1 refills | Status: DC

## 2016-03-26 MED ORDER — TIOTROPIUM BROMIDE-OLODATEROL 2.5-2.5 MCG/ACT IN AERS: 2.5000 | INHALATION_SPRAY | Freq: Every morning | RESPIRATORY_TRACT | 12 refills | Status: DC

## 2016-03-26 NOTE — Assessment & Plan Note (Signed)
Not under good control. Off her meds. Will get her restarted on the sertraline, starting at 25mg  and work up to 100mg  if needed. Recheck 1 month. Call with any concerns.

## 2016-03-26 NOTE — Assessment & Plan Note (Signed)
Stable. Lungs clear today. Will continue current regimen. Continue to monitor.

## 2016-03-26 NOTE — Assessment & Plan Note (Signed)
Has not had any seizures. Will check dilantin level. Referral to neurology made today.

## 2016-03-26 NOTE — Progress Notes (Signed)
BP 114/72 (BP Location: Left Arm, Patient Position: Sitting, Cuff Size: Normal)   Pulse 79   Temp 98.4 F (36.9 C) (Oral)   Resp 18   Ht 4\' 11"  (1.499 m)   Wt 174 lb (78.9 kg)   SpO2 93%   BMI 35.14 kg/m    Subjective:    Patient ID: Sandy Daniel, female    DOB: 1929-01-05, 83 y.o.   MRN: [id number]  HPI: Sandy Daniel is a 83 y.o. female  Chief Complaint  Patient presents with  . Establish Care  . Depression    Per screening  . Medication Refill    lipitor, sertraline, lorazepam, carvedilol, seroquel, oxy, pepcid   HYPERTENSION / HYPERLIPIDEMIA- been out of her cholesterol medicine since before November Satisfied with current treatment? yes Duration of hypertension: chronic BP monitoring frequency: not checking BP medication side effects: no Past BP meds: carvidilol Duration of hyperlipidemia: chronic Cholesterol medication side effects: no Cholesterol supplements: none Past cholesterol medications: atorvastain (lipitor) Medication compliance: excellent compliance on her blood pressure meds, has been out of atorvastatin for about 4-5 months Aspirin: yes Recent stressors: yes Recurrent headaches: no Visual changes: yes Palpitations: yes Dyspnea: no Chest pain: no Lower extremity edema: no Dizzy/lightheaded: no  DEPRESSION- has been off all meds for 4-5 months. Under a lot of stress. Has been having almost daily panic attacks. Severe claustrophobia. Would like a refill on xanax- was taking 0.25mg  at bedtime as needed. Doesn't feel like she needs a sleeping pill, would like a nerve pill. Found the seroquel really helpful when she was taking it previously Mood status: uncontrolled Satisfied with current treatment?: no Symptom severity: severe  Duration of current treatment : chronic- but has been off 4-5 months.  Side effects: no Medication compliance: poor compliance Psychotherapy/counseling: no  Previous psychiatric medications: paxil, sertraline,  seroquel, xanax Depressed mood: yes Anxious mood: yes Anhedonia: no Significant weight loss or gain: no Insomnia: yes hard to stay asleep Fatigue: yes Feelings of worthlessness or guilt: yes Impaired concentration/indecisiveness: yes Suicidal ideations: no Hopelessness: yes Crying spells: yes Depression screen PHQ 2/9 03/26/2016  Decreased Interest 2  Down, Depressed, Hopeless 3  PHQ - 2 Score 5  Altered sleeping 3  Tired, decreased energy 3  Change in appetite 3  Feeling bad or failure about yourself  3  Trouble concentrating 3  Moving slowly or fidgety/restless 2  Suicidal thoughts 0  PHQ-9 Score 22  Difficult doing work/chores Not difficult at all   Was taking oxycodone for pain from arthritis.   Has been taking dilantin for seizures. Hasn't had a seizure in over a year. Needs referral to neurology.   DYSPHAGIA Duration: months Description of symptom: trouble swallowing meat and bread, had pill caught in her throat previously and at the ER in December Onset: Several seconds after swallowing Location of dysphagia: throat Dysphagia to solids only: yes Dysphagia to solids & liquids: no  Frequency:intermittent  Progressively getting worse: no Alleviatiating factors: nothing Provoking factors: pills, certain foods Status: worse EGD: no Weight loss: no Sensation of lump in throat: yes Heartburn: yes Odynophagia: yes Nausea: no Vomiting: no Drooling/nasal regurgitation/food spillage: no Coughing/choking/dysphonia: no Dysarthria: no Hematemesis: no Regurgitation of undigested food/halitosis: no Chest pain: no   Active Ambulatory Problems    Diagnosis Date Noted  . CAD (coronary artery disease)   . Epilepsy (HCC)   . Macular degeneration of both eyes   . B12 deficiency   . COPD (chronic obstructive pulmonary disease) (HCC)   .  Hypertension   . Hyperlipidemia   . Arthritis   . Depression   . GERD (gastroesophageal reflux disease)   . Dysphagia 03/26/2016    . Panic disorder 03/26/2016   Resolved Ambulatory Problems    Diagnosis Date Noted  . Macular degeneration (senile) of retina   . MI (myocardial infarction)    Past Medical History:  Diagnosis Date  . Arthritis   . Asthma   . B12 deficiency   . CAD (coronary artery disease)   . COPD (chronic obstructive pulmonary disease) (HCC)   . Depression   . Epilepsy (HCC)   . GERD (gastroesophageal reflux disease)   . Hyperlipidemia   . Hypertension   . Hypoxemia   . Macular degeneration (senile) of retina   . Macular degeneration of both eyes   . MI (myocardial infarction) 09/2003, 05/2013  . Physiological tremor   . Presence of stent of CABG   . Seizures (HCC)    Past Surgical History:  Procedure Laterality Date  . ABDOMINAL HYSTERECTOMY    . APPENDECTOMY    . CARDIAC SURGERY    . CHOLECYSTECTOMY  1962  . CORONARY ANGIOPLASTY WITH STENT PLACEMENT  Aug. 2005 and March 2015  . ROTATOR CUFF REPAIR Right   . VENTRAL HERNIA REPAIR  2002   Outpatient Encounter Prescriptions as of 03/26/2016  Medication Sig  . albuterol (PROVENTIL) (2.5 MG/3ML) 0.083% nebulizer solution Take 3 mLs (2.5 mg total) by nebulization every 6 (six) hours as needed for Wheezing.  Marland Kitchen atorvastatin (LIPITOR) 10 MG tablet Take 10 mg by mouth daily.  . carvedilol (COREG) 3.125 MG tablet Take 1 tablet (3.125 mg total) by mouth daily.  . famotidine (PEPCID) 20 MG tablet Take 1 tablet (20 mg total) by mouth daily.  . nitroGLYCERIN (NITROSTAT) 0.4 MG SL tablet Place 1 tablet (0.4 mg total) under the tongue every 5 (five) minutes as needed for chest pain.  Marland Kitchen oxyCODONE (OXY IR/ROXICODONE) 5 MG immediate release tablet Take by mouth.  . phenytoin (DILANTIN) 100 MG ER capsule Take 2 capsules (200 mg total) by mouth at bedtime. Take 2 by mouth at bedtime  . QUEtiapine (SEROQUEL) 25 MG tablet Take 1 tablet (25 mg total) by mouth 3 (three) times daily.  . sertraline (ZOLOFT) 50 MG tablet Take 1 tablet (50 mg total) by mouth  daily. 1/2 tab for 1 week, then increase to 1 tab daily  . Tiotropium Bromide-Olodaterol (STIOLTO RESPIMAT) 2.5-2.5 MCG/ACT AERS Inhale 2.5 sprays into the lungs every morning.  . [DISCONTINUED] albuterol (PROVENTIL) (2.5 MG/3ML) 0.083% nebulizer solution Take 3 mLs (2.5 mg total) by nebulization every 6 (six) hours as needed for Wheezing.  . [DISCONTINUED] carvedilol (COREG) 3.125 MG tablet Take 3.125 mg by mouth daily.  . [DISCONTINUED] famotidine (PEPCID) 20 MG tablet Take 20 mg by mouth daily.  . [DISCONTINUED] nitroGLYCERIN (NITROSTAT) 0.4 MG SL tablet Place under the tongue.  . [DISCONTINUED] phenytoin (DILANTIN) 100 MG ER capsule Take by mouth at bedtime. Take 2 by mouth at bedtime  . [DISCONTINUED] QUEtiapine (SEROQUEL) 50 MG tablet Take 50 mg by mouth at bedtime. Take 1/2 tablet three times daily  . [DISCONTINUED] sertraline (ZOLOFT) 100 MG tablet Take 100 mg by mouth daily.  . [DISCONTINUED] Tiotropium Bromide-Olodaterol (STIOLTO RESPIMAT) 2.5-2.5 MCG/ACT AERS Inhale 2.5 sprays into the lungs every morning.   No facility-administered encounter medications on file as of 03/26/2016.    Allergies  Allergen Reactions  . Motrin [Ibuprofen]    Family History  Problem Relation Age  of Onset  . Diabetes Mother   . Cancer Sister   . Diabetes Sister   . Heart disease Sister   . Heart attack Sister   . Hyperlipidemia Sister   . Hypertension Sister   . Cancer Brother   . Diabetes Brother   . Heart disease Brother   . Heart attack Brother   . Hyperlipidemia Brother   . Hypertension Brother    Social History   Social History  . Marital status: Widowed    Spouse name: N/A  . Number of children: N/A  . Years of education: N/A   Occupational History  . Not on file.   Social History Main Topics  . Smoking status: Former Smoker    Years: 50.00  . Smokeless tobacco: Never Used  . Alcohol use No  . Drug use: No  . Sexual activity: Not on file   Other Topics Concern  . Not on  file   Social History Narrative  . No narrative on file     Review of Systems  Constitutional: Negative.   Respiratory: Negative.   Cardiovascular: Negative.   Gastrointestinal: Negative for abdominal distention, abdominal pain, anal bleeding, blood in stool, constipation, diarrhea, nausea, rectal pain and vomiting.  Musculoskeletal: Positive for arthralgias, joint swelling and myalgias. Negative for back pain, gait problem, neck pain and neck stiffness.  Neurological: Negative.   Psychiatric/Behavioral: Positive for agitation, dysphoric mood and sleep disturbance. Negative for behavioral problems, confusion, decreased concentration, hallucinations, self-injury and suicidal ideas. The patient is nervous/anxious. The patient is not hyperactive.     Per HPI unless specifically indicated above     Objective:    BP 114/72 (BP Location: Left Arm, Patient Position: Sitting, Cuff Size: Normal)   Pulse 79   Temp 98.4 F (36.9 C) (Oral)   Resp 18   Ht 4\' 11"  (1.499 m)   Wt 174 lb (78.9 kg)   SpO2 93%   BMI 35.14 kg/m   Wt Readings from Last 3 Encounters:  03/26/16 174 lb (78.9 kg)  03/08/16 187 lb (84.8 kg)  02/05/16 191 lb 3.2 oz (86.7 kg)    Physical Exam  Constitutional: She is oriented to person, place, and time. She appears well-developed and well-nourished. No distress.  HENT:  Head: Normocephalic and atraumatic.  Right Ear: Hearing normal.  Left Ear: Hearing normal.  Nose: Nose normal.  Eyes: Conjunctivae and lids are normal. Right eye exhibits no discharge. Left eye exhibits no discharge. No scleral icterus.  Cardiovascular: Normal rate, regular rhythm, normal heart sounds and intact distal pulses.  Exam reveals no gallop and no friction rub.   No murmur heard. Pulmonary/Chest: Effort normal and breath sounds normal. No respiratory distress. She has no wheezes. She has no rales. She exhibits no tenderness.  Musculoskeletal: Normal range of motion.  Neurological: She is  alert and oriented to person, place, and time.  Skin: Skin is warm, dry and intact. No rash noted. No erythema. No pallor.  Psychiatric: Her speech is normal and behavior is normal. Judgment and thought content normal. Her mood appears anxious. Cognition and memory are normal.  Nursing note and vitals reviewed.   Results for orders placed or performed during the hospital encounter of 03/09/16  Lipase, blood  Result Value Ref Range   Lipase 25 11 - 51 U/L  Comprehensive metabolic panel  Result Value Ref Range   Sodium 138 135 - 145 mmol/L   Potassium 3.6 3.5 - 5.1 mmol/L   Chloride 103 101 -  111 mmol/L   CO2 26 22 - 32 mmol/L   Glucose, Bld 104 (H) 65 - 99 mg/dL   BUN 16 6 - 20 mg/dL   Creatinine, Ser 1.61 0.44 - 1.00 mg/dL   Calcium 8.8 (L) 8.9 - 10.3 mg/dL   Total Protein 7.6 6.5 - 8.1 g/dL   Albumin 3.8 3.5 - 5.0 g/dL   AST 23 15 - 41 U/L   ALT 14 14 - 54 U/L   Alkaline Phosphatase 159 (H) 38 - 126 U/L   Total Bilirubin 0.3 0.3 - 1.2 mg/dL   GFR calc non Af Amer >60 >60 mL/min   GFR calc Af Amer >60 >60 mL/min   Anion gap 9 5 - 15  CBC  Result Value Ref Range   WBC 5.2 3.6 - 11.0 K/uL   RBC 4.03 3.80 - 5.20 MIL/uL   Hemoglobin 12.4 12.0 - 16.0 g/dL   HCT 09.6 04.5 - 40.9 %   MCV 89.3 80.0 - 100.0 fL   MCH 30.7 26.0 - 34.0 pg   MCHC 34.4 32.0 - 36.0 g/dL   RDW 81.1 91.4 - 78.2 %   Platelets 291 150 - 440 K/uL  Urinalysis, Complete w Microscopic  Result Value Ref Range   Color, Urine COLORLESS (A) YELLOW   APPearance CLEAR (A) CLEAR   Specific Gravity, Urine 1.003 (L) 1.005 - 1.030   pH 7.0 5.0 - 8.0   Glucose, UA NEGATIVE NEGATIVE mg/dL   Hgb urine dipstick NEGATIVE NEGATIVE   Bilirubin Urine NEGATIVE NEGATIVE   Ketones, ur NEGATIVE NEGATIVE mg/dL   Protein, ur NEGATIVE NEGATIVE mg/dL   Nitrite NEGATIVE NEGATIVE   Leukocytes, UA NEGATIVE NEGATIVE   RBC / HPF 0-5 0 - 5 RBC/hpf   WBC, UA 0-5 0 - 5 WBC/hpf   Bacteria, UA MANY (A) NONE SEEN   Squamous Epithelial /  LPF 6-30 (A) NONE SEEN   Mucous PRESENT   Troponin I  Result Value Ref Range   Troponin I <0.03 <0.03 ng/mL      Assessment & Plan:   Problem List Items Addressed This Visit      Cardiovascular and Mediastinum   CAD (coronary artery disease)    No chest pain. Will get her into cardiology for evaluation. Call with any concerns.       Relevant Medications   atorvastatin (LIPITOR) 10 MG tablet   carvedilol (COREG) 3.125 MG tablet   nitroGLYCERIN (NITROSTAT) 0.4 MG SL tablet   Other Relevant Orders   Comprehensive metabolic panel   Ambulatory referral to Cardiology   Hypertension - Primary    Under good control. Continue current regimen. Continue to monitor. Checking microalbumin next visit.       Relevant Medications   atorvastatin (LIPITOR) 10 MG tablet   carvedilol (COREG) 3.125 MG tablet   nitroGLYCERIN (NITROSTAT) 0.4 MG SL tablet   Other Relevant Orders   Comprehensive metabolic panel   Microalbumin, Urine Waived     Respiratory   COPD (chronic obstructive pulmonary disease) (HCC)    Stable. Lungs clear today. Will continue current regimen. Continue to monitor.       Relevant Medications   albuterol (PROVENTIL) (2.5 MG/3ML) 0.083% nebulizer solution   Tiotropium Bromide-Olodaterol (STIOLTO RESPIMAT) 2.5-2.5 MCG/ACT AERS     Digestive   GERD (gastroesophageal reflux disease)    Continue pepcid. Having dysphagia. Will get her into GI. Continue to monitor.       Relevant Medications   famotidine (PEPCID) 20 MG tablet  Other Relevant Orders   CBC with Differential/Platelet   Comprehensive metabolic panel   Dysphagia    States that she was supposed to have dilation in the past but didn't. Will get her into GI. Referral generated today.      Relevant Orders   Ambulatory referral to Gastroenterology     Nervous and Auditory   Epilepsy [facility])    Has not had any seizures. Will check dilantin level. Referral to neurology made today.      Relevant Orders    Comprehensive metabolic panel   Dilantin (Phenytoin) level, total   Ambulatory referral to Neurology     Musculoskeletal and Integument   Arthritis    Severe. Has been on oxycodone for pain. Discussed that I do not prescribe any controlled substances on the first visit. Will get her back to further discuss pain ASAP.      Relevant Medications   oxyCODONE (OXY IR/ROXICODONE) 5 MG immediate release tablet     Other   Macular degeneration of both eyes    To follow up with opthalmology. Appointment already in place.       B12 deficiency    Rechecking labs today. Await results. Call with any concerns.       Relevant Orders   CBC with Differential/Platelet   Comprehensive metabolic panel   Z61 and Folate Panel   Hyperlipidemia    Will check labs today. Await results and will determine if she needs to be back on statin. Given history of MI, will likely have to be back on statin.       Relevant Medications   atorvastatin (LIPITOR) 10 MG tablet   carvedilol (COREG) 3.125 MG tablet   nitroGLYCERIN (NITROSTAT) 0.4 MG SL tablet   Other Relevant Orders   Comprehensive metabolic panel   Lipid Panel w/o Chol/HDL Ratio   Depression    Not under good control. Off her meds. Will get her restarted on the sertraline, starting at 25mg  and work up to 100mg  if needed. Recheck 1 month. Call with any concerns.       Relevant Medications   sertraline (ZOLOFT) 50 MG tablet   Other Relevant Orders   Comprehensive metabolic panel   TSH   B12 and Folate Panel   Panic disorder    Under poor control. Will restart her seroquel and her sertraline. Will recheck to see how she's doing and evaluate whether she still needs xanax or not. Call with any concerns.       Relevant Medications   sertraline (ZOLOFT) 50 MG tablet       Follow up plan: Return ASAP, for discuss pain.

## 2016-03-26 NOTE — Assessment & Plan Note (Signed)
Will check labs today. Await results and will determine if she needs to be back on statin. Given history of MI, will likely have to be back on statin.

## 2016-03-26 NOTE — Assessment & Plan Note (Signed)
Severe. Has been on oxycodone for pain. Discussed that I do not prescribe any controlled substances on the first visit. Will get her back to further discuss pain ASAP.

## 2016-03-26 NOTE — Assessment & Plan Note (Signed)
Under good control. Continue current regimen. Continue to monitor. Checking microalbumin next visit.

## 2016-03-26 NOTE — Assessment & Plan Note (Signed)
No chest pain. Will get her into cardiology for evaluation. Call with any concerns.

## 2016-03-26 NOTE — Assessment & Plan Note (Signed)
Under poor control. Will restart her seroquel and her sertraline. Will recheck to see how she's doing and evaluate whether she still needs xanax or not. Call with any concerns.

## 2016-03-26 NOTE — Assessment & Plan Note (Signed)
Rechecking labs today. Await results. Call with any concerns.  

## 2016-03-26 NOTE — Assessment & Plan Note (Signed)
To follow up with opthalmology. Appointment already in place.

## 2016-03-26 NOTE — Assessment & Plan Note (Signed)
Continue pepcid. Having dysphagia. Will get her into GI. Continue to monitor.

## 2016-03-26 NOTE — Assessment & Plan Note (Signed)
States that she was supposed to have dilation in the past but didn't. Will get her into GI. Referral generated today.

## 2016-03-27 ENCOUNTER — Encounter: Payer: Self-pay | Admitting: Family Medicine

## 2016-03-27 ENCOUNTER — Ambulatory Visit (INDEPENDENT_AMBULATORY_CARE_PROVIDER_SITE_OTHER): Payer: Medicare HMO | Admitting: Family Medicine

## 2016-03-27 DIAGNOSIS — M1990 Unspecified osteoarthritis, unspecified site: Secondary | ICD-10-CM

## 2016-03-27 DIAGNOSIS — Z79899 Other long term (current) drug therapy: Secondary | ICD-10-CM | POA: Insufficient documentation

## 2016-03-27 LAB — COMPREHENSIVE METABOLIC PANEL
A/G RATIO: 1.3 (ref 1.2–2.2)
ALT: 17 IU/L (ref 0–32)
AST: 21 IU/L (ref 0–40)
Albumin: 3.8 g/dL (ref 3.5–4.7)
Alkaline Phosphatase: 187 IU/L — ABNORMAL HIGH (ref 39–117)
BILIRUBIN TOTAL: 0.3 mg/dL (ref 0.0–1.2)
BUN/Creatinine Ratio: 21 (ref 12–28)
BUN: 17 mg/dL (ref 8–27)
CALCIUM: 9.3 mg/dL (ref 8.7–10.3)
CHLORIDE: 102 mmol/L (ref 96–106)
CO2: 24 mmol/L (ref 18–29)
Creatinine, Ser: 0.8 mg/dL (ref 0.57–1.00)
GFR calc Af Amer: 78 mL/min/{1.73_m2} (ref >59)
GFR calc non Af Amer: 67 mL/min/{1.73_m2} (ref >59)
Globulin, Total: 2.9 g/dL (ref 1.5–4.5)
Glucose: 124 mg/dL — ABNORMAL HIGH (ref 65–99)
POTASSIUM: 4.1 mmol/L (ref 3.5–5.2)
Sodium: 142 mmol/L (ref 134–144)
Total Protein: 6.7 g/dL (ref 6.0–8.5)

## 2016-03-27 LAB — CBC WITH DIFFERENTIAL/PLATELET
BASOS: 0 %
Basophils Absolute: 0 10*3/uL (ref 0.0–0.2)
EOS (ABSOLUTE): 0.1 10*3/uL (ref 0.0–0.4)
EOS: 1 %
Hematocrit: 38.3 % (ref 34.0–46.6)
Hemoglobin: 12.3 g/dL (ref 11.1–15.9)
Immature Grans (Abs): 0 10*3/uL (ref 0.0–0.1)
Immature Granulocytes: 0 %
LYMPHS: 26 %
Lymphocytes Absolute: 1.4 10*3/uL (ref 0.7–3.1)
MCH: 28.9 pg (ref 26.6–33.0)
MCHC: 32.1 g/dL (ref 31.5–35.7)
MCV: 90 fL (ref 79–97)
MONOCYTES: 12 %
MONOS ABS: 0.7 10*3/uL (ref 0.1–0.9)
Neutrophils Absolute: 3.1 10*3/uL (ref 1.4–7.0)
Neutrophils: 61 %
PLATELETS: 325 10*3/uL (ref 150–379)
RBC: 4.25 x10E6/uL (ref 3.77–5.28)
RDW: 14.2 % (ref 12.3–15.4)
WBC: 5.3 10*3/uL (ref 3.4–10.8)

## 2016-03-27 LAB — LIPID PANEL W/O CHOL/HDL RATIO
CHOLESTEROL TOTAL: 243 mg/dL — AB (ref 100–199)
HDL: 52 mg/dL (ref >39)
LDL Calculated: 160 mg/dL — ABNORMAL HIGH (ref 0–99)
TRIGLYCERIDES: 153 mg/dL — AB (ref 0–149)
VLDL Cholesterol Cal: 31 mg/dL (ref 5–40)

## 2016-03-27 LAB — TSH: TSH: 2.15 u[IU]/mL (ref 0.450–4.500)

## 2016-03-27 LAB — B12 AND FOLATE PANEL
Folate: 4.3 ng/mL (ref >3.0)
Vitamin B-12: 309 pg/mL (ref 232–1245)

## 2016-03-27 LAB — PHENYTOIN LEVEL, TOTAL: Phenytoin (Dilantin), Serum: 10.5 ug/mL (ref 10.0–20.0)

## 2016-03-27 MED ORDER — OXYCODONE HCL 5 MG PO TABS: 5.0000 mg | ORAL_TABLET | Freq: Two times a day (BID) | ORAL | 0 refills | Status: DC | PRN

## 2016-03-27 NOTE — Assessment & Plan Note (Signed)
Stable on 2 5mg oxycodone daily. Will continue this dose. Recheck 1 month. Call with any concerns.  

## 2016-03-27 NOTE — Progress Notes (Signed)
BP 117/78 (BP Location: Left Arm, Patient Position: Sitting, Cuff Size: Large)   Pulse 83   Temp 98.7 F (37.1 C) (Oral)   Resp 17   Ht 4\' 11"  (1.499 m)   Wt 173 lb (78.5 kg)   SpO2 94%   BMI 34.94 kg/m    Subjective:    Patient ID: Sandy Daniel, female    DOB: [birth year], 83 y.o.   MRN: [id number]  HPI: Sandy Daniel is a 83 y.o. female  Chief Complaint  Patient presents with  . Medication Refill   ARTHRALGIAS / JOINT ACHES Duration: chronic Pain: yes Symmetric: no  severe Quality: dull and aching Frequency: constant Context:  stable Decreased function/range of motion: {Blank single:19197::"yes","no"  Erythema: {Blank single:19197::"yes","no" Swelling: {Blank single:19197::"yes","no" Heat or warmth: {Blank single:19197::"yes","no" Morning stiffness: {Blank single:19197::"yes","no" Aggravating factors:  Alleviating factors:  Relief with NSAIDs?: No NSAIDs Taken Treatments attempted: pain medicine, tylenol   Involved Joints:     Hands: yes bilateral    Wrists: yes bilateral     Elbows: yes bilateral    Shoulders: yes bilateral    Back: yes     Hips: yes bilateral    Knees: yes bilateral    Ankles: yes bilateral    Feet: yes bilateral  Relevant past medical, surgical, family and social history reviewed and updated as indicated. Interim medical history since our last visit reviewed. Allergies and medications reviewed and updated.  Review of Systems  Constitutional: Negative.   Respiratory: Negative.   Cardiovascular: Negative.   Musculoskeletal: Positive for arthralgias, back pain, gait problem, joint swelling and myalgias. Negative for neck pain and neck stiffness.  Psychiatric/Behavioral: Negative.     Per HPI unless specifically indicated above     Objective:    BP 117/78 (BP Location: Left Arm, Patient Position: Sitting, Cuff Size: Large)   Pulse 83   Temp 98.7 F (37.1 C) (Oral)   Resp 17   Ht 4\' 11"  (1.499 m)   Wt 173 lb (78.5 kg)    SpO2 94%   BMI 34.94 kg/m   Wt Readings from Last 3 Encounters:  03/27/16 173 lb (78.5 kg)  03/26/16 174 lb (78.9 kg)  03/08/16 187 lb (84.8 kg)    Physical Exam  Constitutional: She is oriented to person, place, and time. She appears well-developed and well-nourished. No distress.  HENT:  Head: Normocephalic and atraumatic.  Right Ear: Hearing normal.  Left Ear: Hearing normal.  Nose: Nose normal.  Eyes: Conjunctivae and lids are normal. Right eye exhibits no discharge. Left eye exhibits no discharge. No scleral icterus.  Cardiovascular: Normal rate, regular rhythm, normal heart sounds and intact distal pulses.  Exam reveals no gallop and no friction rub.   Pulmonary/Chest: Effort normal and breath sounds normal. No respiratory distress. She has no wheezes. She has no rales. She exhibits no tenderness.  Musculoskeletal: Normal range of motion. She exhibits edema, tenderness and deformity.  Severe deformative arthritis, hands, knees and feet  Neurological: She is alert and oriented to person, place, and time.  Skin: Skin is warm, dry and intact. No rash noted. No erythema. No pallor.  Psychiatric: She has a normal mood and affect. Her speech is normal and behavior is normal. Judgment and thought content normal. Cognition and memory are normal.  Nursing note and vitals reviewed.   Results for orders placed or performed in visit on 03/26/16  CBC with Differential/Platelet  Result Value Ref Range   WBC 5.3 3.4 - 10.8 x10E3/uL  RBC 4.25 3.77 - 5.28 x10E6/uL   Hemoglobin 12.3 11.1 - 15.9 g/dL   Hematocrit 28.3 66.2 - 46.6 %   MCV 90 79 - 97 fL   MCH 28.9 26.6 - 33.0 pg   MCHC 32.1 31.5 - 35.7 g/dL   RDW 94.7 65.4 - 65.0 %   Platelets 325 150 - 379 x10E3/uL   Neutrophils 61 Not Estab. %   Lymphs 26 Not Estab. %   Monocytes 12 Not Estab. %   Eos 1 Not Estab. %   Basos 0 Not Estab. %   Neutrophils Absolute 3.1 1.4 - 7.0 x10E3/uL   Lymphocytes Absolute 1.4 0.7 - 3.1 x10E3/uL    Monocytes Absolute 0.7 0.1 - 0.9 x10E3/uL   EOS (ABSOLUTE) 0.1 0.0 - 0.4 x10E3/uL   Basophils Absolute 0.0 0.0 - 0.2 x10E3/uL   Immature Granulocytes 0 Not Estab. %   Immature Grans (Abs) 0.0 0.0 - 0.1 x10E3/uL  Comprehensive metabolic panel  Result Value Ref Range   Glucose 124 (H) 65 - 99 mg/dL   BUN 17 8 - 27 mg/dL   Creatinine, Ser 3.54 0.57 - 1.00 mg/dL   GFR calc non Af Amer 67 >59 mL/min/1.73   GFR calc Af Amer 78 >59 mL/min/1.73   BUN/Creatinine Ratio 21 12 - 28   Sodium 142 134 - 144 mmol/L   Potassium 4.1 3.5 - 5.2 mmol/L   Chloride 102 96 - 106 mmol/L   CO2 24 18 - 29 mmol/L   Calcium 9.3 8.7 - 10.3 mg/dL   Total Protein 6.7 6.0 - 8.5 g/dL   Albumin 3.8 3.5 - 4.7 g/dL   Globulin, Total 2.9 1.5 - 4.5 g/dL   Albumin/Globulin Ratio 1.3 1.2 - 2.2   Bilirubin Total 0.3 0.0 - 1.2 mg/dL   Alkaline Phosphatase 187 (H) 39 - 117 IU/L   AST 21 0 - 40 IU/L   ALT 17 0 - 32 IU/L  Lipid Panel w/o Chol/HDL Ratio  Result Value Ref Range   Cholesterol, Total 243 (H) 100 - 199 mg/dL   Triglycerides 656 (H) 0 - 149 mg/dL   HDL 52 >81 mg/dL   VLDL Cholesterol Cal 31 5 - 40 mg/dL   LDL Calculated 275 (H) 0 - 99 mg/dL  TSH  Result Value Ref Range   TSH 2.150 0.450 - 4.500 uIU/mL  B12 and Folate Panel  Result Value Ref Range   Vitamin B-12 309 232 - 1,245 pg/mL   Folate 4.3 >3.0 ng/mL  Dilantin (Phenytoin) level, total  Result Value Ref Range   Phenytoin (Dilantin), Serum 10.5 10.0 - 20.0 ug/mL      Assessment & Plan:   Problem List Items Addressed This Visit      Musculoskeletal and Integument   Arthritis    Stable on 2 5mg  oxycodone daily. Will continue this dose. Recheck 1 month. Call with any concerns.       Relevant Medications   oxyCODONE (OXY IR/ROXICODONE) 5 MG immediate release tablet     Other   Controlled substance agreement signed    For oxycodone. Rx given today. See scanned document.          Follow up plan: Return in about 4 weeks (around 04/24/2016)  for follow up mood and arthritis.

## 2016-03-27 NOTE — Assessment & Plan Note (Signed)
For oxycodone. Rx given today. See scanned document.

## 2016-03-29 ENCOUNTER — Telehealth: Payer: Self-pay | Admitting: Family Medicine

## 2016-03-29 NOTE — Telephone Encounter (Signed)
Called and let patient know what Dr. Laural Benes said. Patient states that she does not want to take this medication because she states that it is for bipolar and that she is not bipolar. Patient states that she was on Xanax in the past and would like to be back on that. While on the phone, Dr. Laural Benes over heard the phone call and spoke to the patient herself. Will route to her for call documentation.

## 2016-03-29 NOTE — Telephone Encounter (Signed)
Spoke to patient. She is willing to try 1/2 a seroquel 1-2x a day until Monday and we will call her. If she likes it, we will continue this, if not we will start her on something different.

## 2016-03-29 NOTE — Telephone Encounter (Signed)
Pt called and stated that she doesn't want to take QUEtiapine (SEROQUEL) 25 MG tablet because she isn't bipolar she only needs something for her nerves and would like alprazolam instead.

## 2016-03-29 NOTE — Telephone Encounter (Signed)
Routing to provider  

## 2016-03-29 NOTE — Telephone Encounter (Signed)
That is not a medicine for bipolar at that dose. It's the same nerve pill she was on before.

## 2016-04-02 NOTE — Telephone Encounter (Signed)
Called pharmacy. I spoke with Jillyn Hidden and he stated that he did receive the prescription that was sent in but had it on hold until today. He stated that the prescription would be ready to be picked up this afternoon. Will call and let patient know.

## 2016-04-02 NOTE — Telephone Encounter (Signed)
Called and left patient a VM (not detailed) letting her know that her prescription would be ready to be picked up this afternoon. Will route back to provider as she was going to f/up with patient tomorrow on another medication.

## 2016-04-02 NOTE — Telephone Encounter (Signed)
According to chart, medication was sent in a week ago. Will call and confirm with pharmacy.

## 2016-04-03 ENCOUNTER — Telehealth: Payer: Self-pay | Admitting: Family Medicine

## 2016-04-03 MED ORDER — ALBUTEROL SULFATE HFA 108 (90 BASE) MCG/ACT IN AERS: 2.0000 | INHALATION_SPRAY | Freq: Four times a day (QID) | RESPIRATORY_TRACT | 1 refills | Status: DC | PRN

## 2016-04-03 NOTE — Telephone Encounter (Signed)
Patient needs albuterol inhaler refill sent in to [facility]   Thank You Clydie Braun

## 2016-04-06 ENCOUNTER — Other Ambulatory Visit: Payer: Self-pay | Admitting: Family Medicine

## 2016-04-06 MED ORDER — LEVALBUTEROL TARTRATE 45 MCG/ACT IN AERO: 2.0000 | INHALATION_SPRAY | RESPIRATORY_TRACT | 0 refills | Status: DC | PRN

## 2016-04-09 ENCOUNTER — Telehealth: Payer: Self-pay | Admitting: Family Medicine

## 2016-04-09 ENCOUNTER — Encounter: Payer: Self-pay | Admitting: *Deleted

## 2016-04-09 NOTE — Telephone Encounter (Signed)
Called to check in on Sandy Daniel's nerves. Unable to leave a message on her VM. Will try her again later.

## 2016-04-13 NOTE — Telephone Encounter (Signed)
Called to check in on Sandy Daniel's nerves. Unable to leave a message on her VM. Will try her again later.  

## 2016-04-17 NOTE — Telephone Encounter (Signed)
Called to check in on Sandy Daniel nerves. Unable to leave a message. Will send letter.

## 2016-04-18 ENCOUNTER — Ambulatory Visit: Payer: Medicare HMO | Admitting: Gastroenterology

## 2016-04-19 ENCOUNTER — Encounter: Payer: Self-pay | Admitting: *Deleted

## 2016-04-19 ENCOUNTER — Ambulatory Visit (INDEPENDENT_AMBULATORY_CARE_PROVIDER_SITE_OTHER): Payer: Medicare HMO | Admitting: Cardiology

## 2016-04-19 VITALS — BP 164/80 | HR 62 | Ht 59.0 in | Wt 170.2 lb

## 2016-04-19 DIAGNOSIS — I10 Essential (primary) hypertension: Secondary | ICD-10-CM | POA: Diagnosis not present

## 2016-04-19 DIAGNOSIS — E7849 Other hyperlipidemia: Secondary | ICD-10-CM

## 2016-04-19 DIAGNOSIS — E784 Other hyperlipidemia: Secondary | ICD-10-CM | POA: Diagnosis not present

## 2016-04-19 DIAGNOSIS — I2510 Atherosclerotic heart disease of native coronary artery without angina pectoris: Secondary | ICD-10-CM | POA: Diagnosis not present

## 2016-04-19 NOTE — Patient Instructions (Signed)
Follow-Up: Your physician wants you to follow-up in: 6 months. You will receive a reminder letter in the mail two months in advance. If you don't receive a letter, please call our office to schedule the follow-up appointment.  It was a pleasure seeing you today here in the office. Please do not hesitate to give us a call back if you have any further questions. [phone]  Mitchelle Sultan A. RN, BSN     

## 2016-04-19 NOTE — Progress Notes (Signed)
Cardiology Office Note   Date:  04/19/2016   ID:  Sandy Daniel, DOB [birth year], MRN [id number]  Referring Doctor:  Olevia Perches, DO   Cardiologist:   Almond Lint, MD   Reason for consultation:  Chief Complaint  Patient presents with  . other    referred by Dr.Johnson CAD Reviewed meds with pt verablly.      History of Present Illness: Sandy Daniel is a 83 y.o. female who presents for Establishing care for coronary artery disease  Review of care everywhere shows: On April 2015 she underwent intervention with placement of a drug-eluting stent for a 95% stenosis in the left circumflex. A prior stent in the proximal LAD was widely patent. She had mild disease of the RCA. She was recently hospitalized with seizures and reported experiencing chest pain. She underwent diagnostic cardiac catheterization revealing wide patency of previously placed stents, no new lesions. Personal review of recent extensive notes in the hospital, laboratory and diagnostic data, cardiac catheterization images.   Since moving to [place], she has had no changes in her chronic shortness of breath. No chest pains. No PND, orthopnea, edema.   She is not quite sure about all the medications that she is taking.  ROS:  Please see the history of present illness. Aside from mentioned under HPI, all other systems are reviewed and negative.     Past Medical History:  Diagnosis Date  . Arthritis   . Asthma   . B12 deficiency   . CAD (coronary artery disease)   . COPD (chronic obstructive pulmonary disease) (HCC)   . Depression   . Epilepsy (HCC)   . GERD (gastroesophageal reflux disease)   . Hyperlipidemia   . Hypertension   . Hypoxemia   . Macular degeneration (senile) of retina   . Macular degeneration of both eyes   . MI (myocardial infarction) 09/2003, 05/2013  . Physiological tremor   . Presence of stent of CABG    x2   . Seizures (HCC)     Past Surgical History:  Procedure  Laterality Date  . ABDOMINAL HYSTERECTOMY    . APPENDECTOMY    . CARDIAC CATHETERIZATION  05/2013  . CARDIAC SURGERY    . CATARACT EXTRACTION  03/2016  . CHOLECYSTECTOMY  1962  . CORONARY ANGIOPLASTY WITH STENT PLACEMENT  Aug. 2005 and March 2015  . ROTATOR CUFF REPAIR Right   . VENTRAL HERNIA REPAIR  2002     reports that she quit smoking about 2 years ago. She quit after 50.00 years of use. She has never used smokeless tobacco. She reports that she does not drink alcohol or use drugs.   family history includes Cancer in her brother and sister; Diabetes in her brother, mother, and sister; Heart attack in her brother and sister; Heart disease in her brother and sister; Hyperlipidemia in her brother and sister; Hypertension in her brother and sister.   Outpatient Medications Prior to Visit  Medication Sig Dispense Refill  . albuterol (PROVENTIL HFA;VENTOLIN HFA) 108 (90 Base) MCG/ACT inhaler Inhale 2 puffs into the lungs every 6 (six) hours as needed for wheezing or shortness of breath. 1 Inhaler 1  . albuterol (PROVENTIL) (2.5 MG/3ML) 0.083% nebulizer solution Take 3 mLs (2.5 mg total) by nebulization every 6 (six) hours as needed for Wheezing. 75 mL 3  . atorvastatin (LIPITOR) 10 MG tablet Take 10 mg by mouth daily.    . carvedilol (COREG) 3.125 MG tablet Take 1 tablet (3.125 mg total) by mouth  daily. 90 tablet 1  . famotidine (PEPCID) 20 MG tablet Take 1 tablet (20 mg total) by mouth daily. 90 tablet 1  . levalbuterol (XOPENEX HFA) 45 MCG/ACT inhaler Inhale 2 puffs into the lungs every 4 (four) hours as needed for wheezing. 1 Inhaler 0  . nitroGLYCERIN (NITROSTAT) 0.4 MG SL tablet Place 1 tablet (0.4 mg total) under the tongue every 5 (five) minutes as needed for chest pain. 20 tablet 5  . oxyCODONE (OXY IR/ROXICODONE) 5 MG immediate release tablet Take 1 tablet (5 mg total) by mouth every 12 (twelve) hours as needed for severe pain. 60 tablet 0  . phenytoin (DILANTIN) 100 MG ER capsule  Take 2 capsules (200 mg total) by mouth at bedtime. Take 2 by mouth at bedtime 180 capsule 1  . QUEtiapine (SEROQUEL) 25 MG tablet Take 1 tablet (25 mg total) by mouth 3 (three) times daily. 90 tablet 1  . sertraline (ZOLOFT) 50 MG tablet Take 1 tablet (50 mg total) by mouth daily. 1/2 tab for 1 week, then increase to 1 tab daily 30 tablet 1  . Tiotropium Bromide-Olodaterol (STIOLTO RESPIMAT) 2.5-2.5 MCG/ACT AERS Inhale 2.5 sprays into the lungs every morning. 4 g 12   No facility-administered medications prior to visit.      Allergies: Motrin [ibuprofen]    PHYSICAL EXAM: VS:  BP (!) 164/80 (BP Location: Right Arm, Patient Position: Sitting, Cuff Size: Normal)   Pulse 62   Ht 4\' 11"  (1.499 m)   Wt 170 lb 4 oz (77.2 kg)   BMI 34.39 kg/m  , Body mass index is 34.39 kg/m. Wt Readings from Last 3 Encounters:  04/19/16 170 lb 4 oz (77.2 kg)  03/27/16 173 lb (78.5 kg)  03/26/16 174 lb (78.9 kg)    GENERAL:  well developed, well nourished, obese, not in acute distress HEENT: normocephalic, pink conjunctivae, anicteric sclerae, no xanthelasma, normal dentition, oropharynx clear NECK:  no neck vein engorgement, JVP normal, no hepatojugular reflux, carotid upstroke brisk and symmetric, no bruit, no thyromegaly, no lymphadenopathy LUNGS:  good respiratory effort, clear to auscultation bilaterally CV:  PMI not displaced, no thrills, no lifts, S1 and S2 within normal limits, no palpable S3 or S4, no murmurs, no rubs, no gallops ABD:  Soft, nontender, nondistended, normoactive bowel sounds, no abdominal aortic bruit, no hepatomegaly, no splenomegaly MS: nontender back, no kyphosis, no scoliosis, no joint deformities EXT:  2+ DP/PT pulses, no edema, no varicosities, no cyanosis, no clubbing SKIN: warm, nondiaphoretic, normal turgor, no ulcers NEUROPSYCH: alert, oriented to person, place, and time, sensory/motor grossly intact, normal mood, appropriate affect  Recent Labs: 03/08/2016: Hemoglobin  12.4 03/26/2016: ALT 17; BUN 17; Creatinine, Ser 0.80; Platelets 325; Potassium 4.1; Sodium 142; TSH 2.150   Lipid Panel    Component Value Date/Time   CHOL 243 (H) 03/26/2016 1043   TRIG 153 (H) 03/26/2016 1043   HDL 52 03/26/2016 1043   LDLCALC 160 (H) 03/26/2016 1043     Other studies Reviewed:  EKG:  The ekg from 04/19/2016 was personally reviewed by me and it revealed sinus rhythm, sinus arrhythmia, first-degree AV block. Left axis deviation. Possible LVH. 62 bpm.  Additional studies/ records that were reviewed personally reviewed by me today include:  cath 04/05/2015: SUMMARY:  --HEMODYNAMICS: --Hemodynamic assessment demonstrates normal hemodynamics.  --CARDIAC STRUCTURES: --EF calculated by contrast ventriculography was 55 %.  IMPRESSIONS: 1- minimal nonobstructive atherosclerotic coronary artery disease with prior stents in the LAD & circumflex territory, widely patent.  2- normal  left ventricular systolic function with ejection fraction greater than 55%  3- left ventricular end-diastolic pressure is around 18 mmHg with no gradient across the aortic valve  RECOMMENDATIONS: Workup for noncoronary causes of chest pain, continue medical therapy with secondary preventive measures for progression of coronary artery disease  Echo 06/12/2014: A complete two-dimensional transthoracic echocardiogram was performed (2D, M-mode, Doppler and color flow Doppler). The study was technically difficult. Normal global left ventriuclar systolic function. Grade I diastolic dysfunction. Mild mitral and aortic regugitation.   ASSESSMENT AND PLAN: Coronary artery disease Status post previous stents to LAD and circumflex No angina. No change in her symptoms from last heart cath in 2017. Patient unsure of medications but agree with aspirin 81 daily, carvedilol, statin therapy. LDL goal is less than 70.  HTN Blood pressure being followed by PCP. Again, patient unsure of  what she is taking. If additional medications needed for blood pressure, consider ACE inhibitor or ARB. Patient already has baseline first-degree AV block.  Hyperlipidemia Agree with statin therapy, ideal LDL goal is less than 70.  First-degree AV block Previously noted on old EKGs.   Current medicines are reviewed at length with the patient today.  The patient does not have concerns regarding medicines.  Labs/ tests ordered today include:  Orders Placed This Encounter  Procedures  . EKG 12-Lead    I had a lengthy and detailed discussion with the patient regarding diagnoses, prognosis, diagnostic options, treatment options , and side effects of medications.   I counseled the patient on importance of lifestyle modification including heart healthy diet, regular physical activity .   Disposition:   FU with cardiology 6 months  Thank you for this consultation. We will forwarding this consultation to referring physician.   I spent at least 45 minutes with the patient today and more than 50% of the time was spent counseling the patient and coordinating care.    Signed, Almond Lint, MD  04/19/2016 5:49 PM    [facility]  This note was generated in part with voice recognition software and I apologize for any typographical errors that were not detected and corrected.

## 2016-04-22 ENCOUNTER — Other Ambulatory Visit: Payer: Self-pay | Admitting: Family Medicine

## 2016-04-25 ENCOUNTER — Ambulatory Visit (INDEPENDENT_AMBULATORY_CARE_PROVIDER_SITE_OTHER): Payer: Medicare HMO | Admitting: Family Medicine

## 2016-04-25 ENCOUNTER — Encounter: Payer: Self-pay | Admitting: Family Medicine

## 2016-04-25 VITALS — BP 127/77 | HR 66 | Wt 170.0 lb

## 2016-04-25 DIAGNOSIS — F32A Depression, unspecified: Secondary | ICD-10-CM

## 2016-04-25 DIAGNOSIS — F329 Major depressive disorder, single episode, unspecified: Secondary | ICD-10-CM

## 2016-04-25 DIAGNOSIS — M1990 Unspecified osteoarthritis, unspecified site: Secondary | ICD-10-CM | POA: Diagnosis not present

## 2016-04-25 DIAGNOSIS — F410 Panic disorder [episodic paroxysmal anxiety] without agoraphobia: Secondary | ICD-10-CM | POA: Diagnosis not present

## 2016-04-25 MED ORDER — SERTRALINE HCL 100 MG PO TABS: 100.0000 mg | ORAL_TABLET | Freq: Every day | ORAL | 3 refills | Status: DC

## 2016-04-25 MED ORDER — OXYCODONE HCL 5 MG PO TABS: 5.0000 mg | ORAL_TABLET | Freq: Two times a day (BID) | ORAL | 0 refills | Status: DC | PRN

## 2016-04-25 MED ORDER — LORAZEPAM 0.5 MG PO TABS: 0.5000 mg | ORAL_TABLET | Freq: Two times a day (BID) | ORAL | 0 refills | Status: DC | PRN

## 2016-04-25 MED ORDER — QUETIAPINE FUMARATE 50 MG PO TABS: 50.0000 mg | ORAL_TABLET | Freq: Every day | ORAL | 3 refills | Status: DC

## 2016-04-25 NOTE — Assessment & Plan Note (Signed)
Still very anxious. Able to close exam room door more today without a panic attack.  Seroquel not helping huge amount. Will start ativan BID PRN. Increase seroquel at bedtime. Increase sertraline to 100mg  daily. Recheck 1 month.

## 2016-04-25 NOTE — Patient Instructions (Signed)
Increase the quitiapine to 50mg  qHS Increase the sertraline to 100mg  daily  Nerve pill (ativan) 2x a day as needed for really bad nerves

## 2016-04-25 NOTE — Progress Notes (Signed)
BP 127/77   Pulse 66   Wt 170 lb (77.1 kg)   SpO2 95%   BMI 34.34 kg/m    Subjective:    Patient ID: Sandy Daniel, female    DOB: [birth year], 83 y.o.   MRN: [id number]  HPI: Sandy Daniel is a 83 y.o. female  Chief Complaint  Patient presents with  . Follow-up   ANXIETY/STRESS Duration:better Anxious mood: yes  Excessive worrying: yes Irritability: yes  Sweating: no Nausea: no Palpitations:yes Hyperventilation: yes Panic attacks: yes Agoraphobia: yes  Obscessions/compulsions: yes Depressed mood: yes Depression screen PHQ 2/9 03/26/2016  Decreased Interest 2  Down, Depressed, Hopeless 3  PHQ - 2 Score 5  Altered sleeping 3  Tired, decreased energy 3  Change in appetite 3  Feeling bad or failure about yourself  3  Trouble concentrating 3  Moving slowly or fidgety/restless 2  Suicidal thoughts 0  PHQ-9 Score 22  Difficult doing work/chores Not difficult at all   Anhedonia: no Weight changes: no Insomnia: no   Hypersomnia: no Fatigue/loss of energy: no Feelings of worthlessness: no Feelings of guilt: no Impaired concentration/indecisiveness: no Suicidal ideations: no  Crying spells: yes  ARTHRALGIAS / JOINT ACHES Duration: chronic Pain: yes Symmetric: no Severity: severe Quality: dull and aching Frequency: constant Context:  stable Decreased function/range of motion: yes  Erythema: no Swelling: no Heat or warmth: no Morning stiffness: yes Relief with NSAIDs?: No NSAIDs Taken Treatments attempted: pain medicine, tylenol   Involved Joints:     Hands: yes bilateral    Wrists: yes bilateral     Elbows: yes bilateral    Shoulders: yes bilateral    Back: yes     Hips: yes bilateral    Knees: yes bilateral    Ankles: yes bilateral    Feet: yes bilateral  Relevant past medical, surgical, family and social history reviewed and updated as indicated. Interim medical history since our last visit reviewed. Allergies and medications reviewed  and updated.  Review of Systems  Constitutional: Negative.   Respiratory: Negative.   Cardiovascular: Negative.   Musculoskeletal: Positive for arthralgias, back pain, gait problem, myalgias, neck pain and neck stiffness. Negative for joint swelling.  Neurological: Positive for tremors.  Psychiatric/Behavioral: Positive for agitation, behavioral problems and sleep disturbance. Negative for confusion, decreased concentration, dysphoric mood, hallucinations, self-injury and suicidal ideas. The patient is nervous/anxious. The patient is not hyperactive.     Per HPI unless specifically indicated above     Objective:    BP 127/77   Pulse 66   Wt 170 lb (77.1 kg)   SpO2 95%   BMI 34.34 kg/m   Wt Readings from Last 3 Encounters:  04/25/16 170 lb (77.1 kg)  04/19/16 170 lb 4 oz (77.2 kg)  03/27/16 173 lb (78.5 kg)    Physical Exam  Constitutional: She is oriented to person, place, and time. She appears well-developed and well-nourished. No distress.  HENT:  Head: Normocephalic and atraumatic.  Right Ear: Hearing normal.  Left Ear: Hearing normal.  Nose: Nose normal.  Eyes: Conjunctivae and lids are normal. Right eye exhibits no discharge. Left eye exhibits no discharge. No scleral icterus.  Cardiovascular: Normal rate, regular rhythm, normal heart sounds and intact distal pulses.  Exam reveals no gallop and no friction rub.   No murmur heard. Pulmonary/Chest: Effort normal and breath sounds normal. No respiratory distress. She has no wheezes. She has no rales. She exhibits no tenderness.  Musculoskeletal: She exhibits tenderness and deformity.  Neurological:  She is alert and oriented to person, place, and time.  Skin: Skin is warm, dry and intact. No rash noted. No erythema. No pallor.  Psychiatric: Her speech is normal and behavior is normal. Judgment and thought content normal. Her mood appears anxious. Cognition and memory are normal.  Nursing note and vitals  reviewed.   Results for orders placed or performed in visit on 03/26/16  CBC with Differential/Platelet  Result Value Ref Range   WBC 5.3 3.4 - 10.8 x10E3/uL   RBC 4.25 3.77 - 5.28 x10E6/uL   Hemoglobin 12.3 11.1 - 15.9 g/dL   Hematocrit 16.1 09.6 - 46.6 %   MCV 90 79 - 97 fL   MCH 28.9 26.6 - 33.0 pg   MCHC 32.1 31.5 - 35.7 g/dL   RDW 04.5 40.9 - 81.1 %   Platelets 325 150 - 379 x10E3/uL   Neutrophils 61 Not Estab. %   Lymphs 26 Not Estab. %   Monocytes 12 Not Estab. %   Eos 1 Not Estab. %   Basos 0 Not Estab. %   Neutrophils Absolute 3.1 1.4 - 7.0 x10E3/uL   Lymphocytes Absolute 1.4 0.7 - 3.1 x10E3/uL   Monocytes Absolute 0.7 0.1 - 0.9 x10E3/uL   EOS (ABSOLUTE) 0.1 0.0 - 0.4 x10E3/uL   Basophils Absolute 0.0 0.0 - 0.2 x10E3/uL   Immature Granulocytes 0 Not Estab. %   Immature Grans (Abs) 0.0 0.0 - 0.1 x10E3/uL  Comprehensive metabolic panel  Result Value Ref Range   Glucose 124 (H) 65 - 99 mg/dL   BUN 17 8 - 27 mg/dL   Creatinine, Ser 9.14 0.57 - 1.00 mg/dL   GFR calc non Af Amer 67 >59 mL/min/1.73   GFR calc Af Amer 78 >59 mL/min/1.73   BUN/Creatinine Ratio 21 12 - 28   Sodium 142 134 - 144 mmol/L   Potassium 4.1 3.5 - 5.2 mmol/L   Chloride 102 96 - 106 mmol/L   CO2 24 18 - 29 mmol/L   Calcium 9.3 8.7 - 10.3 mg/dL   Total Protein 6.7 6.0 - 8.5 g/dL   Albumin 3.8 3.5 - 4.7 g/dL   Globulin, Total 2.9 1.5 - 4.5 g/dL   Albumin/Globulin Ratio 1.3 1.2 - 2.2   Bilirubin Total 0.3 0.0 - 1.2 mg/dL   Alkaline Phosphatase 187 (H) 39 - 117 IU/L   AST 21 0 - 40 IU/L   ALT 17 0 - 32 IU/L  Lipid Panel w/o Chol/HDL Ratio  Result Value Ref Range   Cholesterol, Total 243 (H) 100 - 199 mg/dL   Triglycerides 782 (H) 0 - 149 mg/dL   HDL 52 >95 mg/dL   VLDL Cholesterol Cal 31 5 - 40 mg/dL   LDL Calculated 621 (H) 0 - 99 mg/dL  TSH  Result Value Ref Range   TSH 2.150 0.450 - 4.500 uIU/mL  B12 and Folate Panel  Result Value Ref Range   Vitamin B-12 309 232 - 1,245 pg/mL    Folate 4.3 >3.0 ng/mL  Dilantin (Phenytoin) level, total  Result Value Ref Range   Phenytoin (Dilantin), Serum 10.5 10.0 - 20.0 ug/mL      Assessment & Plan:   Problem List Items Addressed This Visit      Musculoskeletal and Integument   Arthritis    Stable on 2 5mg  oxycodone daily. Will continue this dose. Recheck 1 month. Call with any concerns.       Relevant Medications   oxyCODONE (OXY IR/ROXICODONE) 5 MG immediate release tablet  Other   Depression    Still very anxious. Able to close exam room door more today without a panic attack.  Seroquel not helping huge amount. Will start ativan BID PRN. Increase seroquel at bedtime. Increase sertraline to 100mg  daily. Recheck 1 month.       Relevant Medications   sertraline (ZOLOFT) 100 MG tablet   LORazepam (ATIVAN) 0.5 MG tablet   Panic disorder - Primary    Still very anxious. Able to close exam room door more today without a panic attack.  Seroquel not helping huge amount. Will start ativan BID PRN. Increase seroquel at bedtime. Increase sertraline to 100mg  daily. Recheck 1 month.       Relevant Medications   sertraline (ZOLOFT) 100 MG tablet   LORazepam (ATIVAN) 0.5 MG tablet       Follow up plan: Return in about 4 weeks (around 05/23/2016) for Follow up mood and pain.

## 2016-04-25 NOTE — Assessment & Plan Note (Signed)
Stable on 2 5mg  oxycodone daily. Will continue this dose. Recheck 1 month. Call with any concerns.

## 2016-04-25 NOTE — Assessment & Plan Note (Signed)
Still very anxious. Able to close exam room door more today without a panic attack.  Seroquel not helping huge amount. Will start ativan BID PRN. Increase seroquel at bedtime. Increase sertraline to 100mg daily. Recheck 1 month.  

## 2016-05-16 ENCOUNTER — Ambulatory Visit: Payer: Medicare HMO | Admitting: Gastroenterology

## 2016-05-22 ENCOUNTER — Telehealth: Payer: Self-pay | Admitting: Family Medicine

## 2016-05-23 NOTE — Telephone Encounter (Signed)
She should not be due until the 14th- is she out?

## 2016-05-23 NOTE — Telephone Encounter (Signed)
Called patsy the daughter back to let her know that she should have enough to last until the 14th.  She understood.  Thanks

## 2016-05-25 MED ORDER — OXYCODONE HCL 5 MG PO TABS: 5.0000 mg | ORAL_TABLET | Freq: Two times a day (BID) | ORAL | 0 refills | Status: DC | PRN

## 2016-05-25 NOTE — Telephone Encounter (Signed)
OK for daughter to come pick up

## 2016-05-25 NOTE — Addendum Note (Signed)
Addended by: Dorcas Carrow on: 05/25/2016 08:27 AM   Modules accepted: Orders

## 2016-05-29 ENCOUNTER — Ambulatory Visit (INDEPENDENT_AMBULATORY_CARE_PROVIDER_SITE_OTHER): Payer: Medicare HMO | Admitting: Family Medicine

## 2016-05-29 ENCOUNTER — Encounter: Payer: Self-pay | Admitting: Family Medicine

## 2016-05-29 VITALS — BP 109/61 | HR 76 | Temp 97.7°F | Wt 168.8 lb

## 2016-05-29 DIAGNOSIS — F329 Major depressive disorder, single episode, unspecified: Secondary | ICD-10-CM | POA: Diagnosis not present

## 2016-05-29 DIAGNOSIS — L738 Other specified follicular disorders: Secondary | ICD-10-CM | POA: Diagnosis not present

## 2016-05-29 DIAGNOSIS — M1990 Unspecified osteoarthritis, unspecified site: Secondary | ICD-10-CM

## 2016-05-29 DIAGNOSIS — F410 Panic disorder [episodic paroxysmal anxiety] without agoraphobia: Secondary | ICD-10-CM

## 2016-05-29 DIAGNOSIS — W19XXXA Unspecified fall, initial encounter: Secondary | ICD-10-CM | POA: Diagnosis not present

## 2016-05-29 DIAGNOSIS — F32A Depression, unspecified: Secondary | ICD-10-CM

## 2016-05-29 MED ORDER — MUPIROCIN CALCIUM 2 % EX CREA: 1.0000 "application " | TOPICAL_CREAM | Freq: Two times a day (BID) | CUTANEOUS | 0 refills | Status: DC

## 2016-05-29 MED ORDER — LIDOCAINE 5 % EX PTCH: 1.0000 | MEDICATED_PATCH | CUTANEOUS | 4 refills | Status: DC

## 2016-05-29 MED ORDER — SERTRALINE HCL 100 MG PO TABS: 200.0000 mg | ORAL_TABLET | Freq: Every day | ORAL | 3 refills | Status: DC

## 2016-05-29 MED ORDER — ATORVASTATIN CALCIUM 10 MG PO TABS: 10.0000 mg | ORAL_TABLET | Freq: Every day | ORAL | 1 refills | Status: DC

## 2016-05-29 MED ORDER — LORAZEPAM 0.5 MG PO TABS: 0.5000 mg | ORAL_TABLET | Freq: Two times a day (BID) | ORAL | 0 refills | Status: DC | PRN

## 2016-05-29 NOTE — Assessment & Plan Note (Signed)
Significantly calmer today. Able to sit in room with door closed without having a panic attack. Will conitnue ativan BID PRN. Continue 50mg seroquel at bedtime. Increase sertraline to 200mg daily. Recheck 1 month.  

## 2016-05-29 NOTE — Assessment & Plan Note (Signed)
Stable on 2 5mg oxycodone daily. Will continue this dose. Recheck 1 month. Call with any concerns.  

## 2016-05-29 NOTE — Assessment & Plan Note (Signed)
Significantly calmer today. Able to sit in room with door closed without having a panic attack. Will conitnue ativan BID PRN. Continue 50mg  seroquel at bedtime. Increase sertraline to 200mg  daily. Recheck 1 month.

## 2016-05-29 NOTE — Progress Notes (Signed)
BP 109/61 (BP Location: Left Arm, Patient Position: Sitting, Cuff Size: Large)   Pulse 76   Temp 97.7 F (36.5 C)   Wt 168 lb 12.8 oz (76.6 kg)   SpO2 95%   BMI 34.09 kg/m    Subjective:    Patient ID: Sandy Daniel, female    DOB: 17-Nov-1928, 83 y.o.   MRN: [id number]  HPI: Sandy Daniel is a 83 y.o. female  Chief Complaint  Patient presents with  . Panic Attack  . Pain   Fell yesterday- went to put her hand on the corner of the table and her knee gave way and she went onto the floor. Fell on her side. She bumped her head and her neck, shoulder and hip and knee. Nothing feels like it's broken.   Has some broken skin on her chin that's hurting.   ANXIETY/STRESS- still doesn't think she's any better. Daughter thinks she's a lot better.  Duration:better Anxious mood: yes  Excessive worrying: yes Irritability: yes  Sweating: no Nausea: no Palpitations:yes Hyperventilation: yes Panic attacks: yes Agoraphobia: yes  Obscessions/compulsions: yes Depressed mood: yes Depression screen [facility] 2/9 05/29/2016 03/26/2016  Decreased Interest 2 2  Down, Depressed, Hopeless 1 3  PHQ - 2 Score 3 5  Altered sleeping - 3  Tired, decreased energy - 3  Change in appetite - 3  Feeling bad or failure about yourself  - 3  Trouble concentrating - 3  Moving slowly or fidgety/restless - 2  Suicidal thoughts - 0  PHQ-9 Score - 22  Difficult doing work/chores - Not difficult at all   Anhedonia: no Weight changes: no Insomnia: no   Hypersomnia: no Fatigue/loss of energy: no Feelings of worthlessness: no Feelings of guilt: no Impaired concentration/indecisiveness: no Suicidal ideations: no  Crying spells: yes   ARTHRALGIAS / JOINT ACHES Duration: chronic Pain: yes Symmetric: no Severity: severe Quality: dull and aching Frequency: constant Context: stable Decreased function/range of motion:yes Erythema: no Swelling: no Heat or warmth: no Morning stiffness:  yes Relief with NSAIDs?:No NSAIDs Taken Treatments attempted: pain medicine, tylenol Involved Joints:  Hands: yesbilateral Wrists: yesbilateral Elbows: yesbilateral Shoulders: yesbilateral Back:yes Hips: yesbilateral Knees: yesbilateral Ankles: yesbilateral Feet: yesbilateral  Relevant past medical, surgical, family and social history reviewed and updated as indicated. Interim medical history since our last visit reviewed. Allergies and medications reviewed and updated.  Review of Systems  Constitutional: Negative.   Respiratory: Negative.   Cardiovascular: Negative.   Musculoskeletal: Positive for arthralgias, back pain, gait problem and joint swelling. Negative for neck pain and neck stiffness.  Skin: Positive for rash. Negative for color change, pallor and wound.  Neurological: Positive for tremors. Negative for dizziness, seizures, syncope, facial asymmetry, speech difficulty, weakness, light-headedness, numbness and headaches.  Psychiatric/Behavioral: Positive for agitation, dysphoric mood and sleep disturbance. Negative for behavioral problems, confusion, decreased concentration, hallucinations, self-injury and suicidal ideas. The patient is nervous/anxious. The patient is not hyperactive.     Per HPI unless specifically indicated above     Objective:    BP 109/61 (BP Location: Left Arm, Patient Position: Sitting, Cuff Size: Large)   Pulse 76   Temp 97.7 F (36.5 C)   Wt 168 lb 12.8 oz (76.6 kg)   SpO2 95%   BMI 34.09 kg/m   Wt Readings from Last 3 Encounters:  05/29/16 168 lb 12.8 oz (76.6 kg)  04/25/16 170 lb (77.1 kg)  04/19/16 170 lb 4 oz (77.2 kg)    Physical Exam  Constitutional: She is oriented  to person, place, and time. She appears well-developed and well-nourished. No distress.  HENT:  Head: Normocephalic and atraumatic.  Right Ear: Hearing normal.  Left Ear: Hearing normal.  Nose: Nose normal.  Eyes:  Conjunctivae and lids are normal. Right eye exhibits no discharge. Left eye exhibits no discharge. No scleral icterus.  Cardiovascular: Normal rate and intact distal pulses.  Exam reveals no gallop and no friction rub.   No murmur heard. Pulmonary/Chest: Effort normal. No respiratory distress. She has no wheezes. She has no rales. She exhibits no tenderness.  Musculoskeletal: Normal range of motion. She exhibits deformity. She exhibits no edema or tenderness.  No tenderness to palpation of the joints, no sign of fracture, normal arthritic changes.   Neurological: She is alert and oriented to person, place, and time.  Skin: Skin is warm, dry and intact. Rash noted. No erythema. No pallor.  Irritated red skin on chin around hair follicle  Psychiatric: She has a normal mood and affect. Her speech is normal and behavior is normal. Judgment and thought content normal. Cognition and memory are normal.  Nursing note and vitals reviewed.   Results for orders placed or performed in visit on 03/26/16  CBC with Differential/Platelet  Result Value Ref Range   WBC 5.3 3.4 - 10.8 x10E3/uL   RBC 4.25 3.77 - 5.28 x10E6/uL   Hemoglobin 12.3 11.1 - 15.9 g/dL   Hematocrit 40.9 81.1 - 46.6 %   MCV 90 79 - 97 fL   MCH 28.9 26.6 - 33.0 pg   MCHC 32.1 31.5 - 35.7 g/dL   RDW 91.4 78.2 - 95.6 %   Platelets 325 150 - 379 x10E3/uL   Neutrophils 61 Not Estab. %   Lymphs 26 Not Estab. %   Monocytes 12 Not Estab. %   Eos 1 Not Estab. %   Basos 0 Not Estab. %   Neutrophils Absolute 3.1 1.4 - 7.0 x10E3/uL   Lymphocytes Absolute 1.4 0.7 - 3.1 x10E3/uL   Monocytes Absolute 0.7 0.1 - 0.9 x10E3/uL   EOS (ABSOLUTE) 0.1 0.0 - 0.4 x10E3/uL   Basophils Absolute 0.0 0.0 - 0.2 x10E3/uL   Immature Granulocytes 0 Not Estab. %   Immature Grans (Abs) 0.0 0.0 - 0.1 x10E3/uL  Comprehensive metabolic panel  Result Value Ref Range   Glucose 124 (H) 65 - 99 mg/dL   BUN 17 8 - 27 mg/dL   Creatinine, Ser 2.13 0.57 - 1.00 mg/dL     GFR calc non Af Amer 67 >59 mL/min/1.73   GFR calc Af Amer 78 >59 mL/min/1.73   BUN/Creatinine Ratio 21 12 - 28   Sodium 142 134 - 144 mmol/L   Potassium 4.1 3.5 - 5.2 mmol/L   Chloride 102 96 - 106 mmol/L   CO2 24 18 - 29 mmol/L   Calcium 9.3 8.7 - 10.3 mg/dL   Total Protein 6.7 6.0 - 8.5 g/dL   Albumin 3.8 3.5 - 4.7 g/dL   Globulin, Total 2.9 1.5 - 4.5 g/dL   Albumin/Globulin Ratio 1.3 1.2 - 2.2   Bilirubin Total 0.3 0.0 - 1.2 mg/dL   Alkaline Phosphatase 187 (H) 39 - 117 IU/L   AST 21 0 - 40 IU/L   ALT 17 0 - 32 IU/L  Lipid Panel w/o Chol/HDL Ratio  Result Value Ref Range   Cholesterol, Total 243 (H) 100 - 199 mg/dL   Triglycerides 086 (H) 0 - 149 mg/dL   HDL 52 >57 mg/dL   VLDL Cholesterol Cal 31 5 -  40 mg/dL   LDL Calculated 161 (H) 0 - 99 mg/dL  TSH  Result Value Ref Range   TSH 2.150 0.450 - 4.500 uIU/mL  B12 and Folate Panel  Result Value Ref Range   Vitamin B-12 309 232 - 1,245 pg/mL   Folate 4.3 >3.0 ng/mL  Dilantin (Phenytoin) level, total  Result Value Ref Range   Phenytoin (Dilantin), Serum 10.5 10.0 - 20.0 ug/mL      Assessment & Plan:   Problem List Items Addressed This Visit      Musculoskeletal and Integument   Arthritis    Stable on 2 5mg  oxycodone daily. Will continue this dose. Recheck 1 month. Call with any concerns.         Other   Depression    Significantly calmer today. Able to sit in room with door closed without having a panic attack. Will conitnue ativan BID PRN. Continue 50mg  seroquel at bedtime. Increase sertraline to 200mg  daily. Recheck 1 month.       Relevant Medications   sertraline (ZOLOFT) 100 MG tablet   LORazepam (ATIVAN) 0.5 MG tablet   Panic disorder - Primary    Significantly calmer today. Able to sit in room with door closed without having a panic attack. Will conitnue ativan BID PRN. Continue 50mg  seroquel at bedtime. Increase sertraline to 200mg  daily. Recheck 1 month.        Other Visit Diagnoses    Fall,  initial encounter       No signs of any fractures. Continue ice/heat, conitnue current dose of oxycodone. Call with any concerns.    Folliculitis barbae       Will treat with mupiriocin. Call with any concerns.        Follow up plan: Return in about 4 weeks (around 06/26/2016).

## 2016-06-07 ENCOUNTER — Encounter: Payer: Self-pay | Admitting: Family Medicine

## 2016-06-07 ENCOUNTER — Ambulatory Visit (INDEPENDENT_AMBULATORY_CARE_PROVIDER_SITE_OTHER): Payer: Medicare HMO | Admitting: Family Medicine

## 2016-06-07 VITALS — BP 172/88 | HR 64 | Temp 97.4°F

## 2016-06-07 DIAGNOSIS — M545 Low back pain, unspecified: Secondary | ICD-10-CM

## 2016-06-07 DIAGNOSIS — F410 Panic disorder [episodic paroxysmal anxiety] without agoraphobia: Secondary | ICD-10-CM

## 2016-06-07 DIAGNOSIS — W19XXXA Unspecified fall, initial encounter: Secondary | ICD-10-CM

## 2016-06-07 DIAGNOSIS — M25551 Pain in right hip: Secondary | ICD-10-CM | POA: Diagnosis not present

## 2016-06-07 DIAGNOSIS — R443 Hallucinations, unspecified: Secondary | ICD-10-CM

## 2016-06-07 DIAGNOSIS — Z7289 Other problems related to lifestyle: Secondary | ICD-10-CM
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
## 2016-06-07 NOTE — Assessment & Plan Note (Addendum)
Has been picking her skin trying to pull "pins" out of her skin. This has resulted in excoriated sores on her skin. Discussed with patient's daughter in law, and discussed that given her hallucinations and her picking, we will not be able to manage her mental issues appropriately, will continue current regimen for now. Will refer urgently to psychiatry. Discussed warning signs to go to the ER. Call with any concerns.

## 2016-06-07 NOTE — Assessment & Plan Note (Signed)
Discussed with patient's daughter in law, and discussed that given her hallucinations and her picking, we will not be able to manage her mental issues appropriately, will continue current regimen for now. Will refer urgently to psychiatry. Discussed warning signs to go to the ER. Call with any concerns.  

## 2016-06-07 NOTE — Assessment & Plan Note (Signed)
Discussed with patient's daughter in law, and discussed that given her hallucinations and her picking, we will not be able to manage her mental issues appropriately, will continue current regimen for now. Will refer urgently to psychiatry. Discussed warning signs to go to the ER. Call with any concerns.

## 2016-06-08 ENCOUNTER — Telehealth: Payer: Self-pay | Admitting: Family Medicine

## 2016-06-08 ENCOUNTER — Ambulatory Visit
Admission: RE | Admit: 2016-06-08 | Discharge: 2016-06-08 | Disposition: A | Discharge status: Home / Self Care | Payer: Medicare HMO | Source: Ambulatory Visit | Attending: Family Medicine | Admitting: Family Medicine

## 2016-06-08 ENCOUNTER — Other Ambulatory Visit: Payer: Self-pay | Admitting: Family Medicine

## 2016-06-08 DIAGNOSIS — M419 Scoliosis, unspecified: Secondary | ICD-10-CM | POA: Insufficient documentation

## 2016-06-08 DIAGNOSIS — W19XXXA Unspecified fall, initial encounter: Secondary | ICD-10-CM

## 2016-06-08 DIAGNOSIS — M25551 Pain in right hip: Secondary | ICD-10-CM | POA: Insufficient documentation

## 2016-06-08 DIAGNOSIS — M5136 Other intervertebral disc degeneration, lumbar region: Secondary | ICD-10-CM | POA: Insufficient documentation

## 2016-06-08 DIAGNOSIS — I700 Atherosclerosis of aorta: Secondary | ICD-10-CM | POA: Diagnosis not present

## 2016-06-08 NOTE — Telephone Encounter (Signed)
Please let her daughter in law know that her x-ray showed quite a bit of arthritis, but no fracture. She should keep using her lidocaine patches and her pain medicine and she can add some tylenol in with the pain medicine, that may help a little too. Thanks!

## 2016-06-08 NOTE — Telephone Encounter (Signed)
Patients daughter in law notified.

## 2016-06-18 ENCOUNTER — Encounter: Payer: Self-pay | Admitting: Nurse Practitioner

## 2016-06-18 ENCOUNTER — Ambulatory Visit (INDEPENDENT_AMBULATORY_CARE_PROVIDER_SITE_OTHER): Payer: Medicare HMO | Admitting: Nurse Practitioner

## 2016-06-18 VITALS — BP 137/57 | HR 68 | Temp 98.1°F | Resp 16 | Wt 163.0 lb

## 2016-06-18 DIAGNOSIS — M542 Cervicalgia: Secondary | ICD-10-CM | POA: Diagnosis not present

## 2016-06-18 DIAGNOSIS — M1990 Unspecified osteoarthritis, unspecified site: Secondary | ICD-10-CM | POA: Diagnosis not present

## 2016-06-18 DIAGNOSIS — R1310 Dysphagia, unspecified: Secondary | ICD-10-CM

## 2016-06-18 MED ORDER — BACLOFEN 10 MG PO TABS: 5.0000 mg | ORAL_TABLET | Freq: Three times a day (TID) | ORAL | 0 refills | Status: AC

## 2016-06-18 MED ORDER — TRAMADOL HCL 50 MG PO TABS: 50.0000 mg | ORAL_TABLET | Freq: Two times a day (BID) | ORAL | 0 refills | Status: DC

## 2016-06-18 NOTE — Progress Notes (Signed)
Subjective:    Patient ID: Sandy Daniel, female    DOB: May 02, 1928, 83 y.o.   MRN: [id number]  Sandy Daniel is a 83 y.o. female presenting on 06/18/2016 for New Patient (Initial Visit) (Patient here today to establish care. Patient transferring care from Dr. Nechama Guard [facility]. Patient C/O neck pain x's 3 weeks due to a fall. Patient reports she felt something "hit her neck and and made a large popping sound" in the last few weeks. )   HPI Neck Pt states the "Left side broke and fell over to the left (shoulder)."  Right side hurts as well with pain radiating down shoulder and into shoulder blades.  States sometimes it feels like two bones are rubbing together.  Cannot extend neck past 180 degrees.  Supports with pillow under neck for sleeping.  Pt states that about 1 week after fall when she landed on her hip, she leaned over on couch and suddenly felt like a golf ball hit the back of her neck.  She also heard a pop and her neck fell toward her shoulder.  Pt insists that "it broke" when referring to her neck.  Back Hurts across the top of her hips feels like something is sticking a knife into it.  Told in past it was arthritis.  Taking "nothing" for it.  She had some pain pills (Vicodin), but they are out.  She is taking tylenol occasionally, but states she isn't supposed to take it r/t "medication for mini-seizures that messes up my liver."  She had previously seen Dr. Laural Benes and had no evidence of fracture.  Throat Has trouble swallowing and gets "choked too easy."  No pain with swallowing.  She has to chew really fine before swallowing so food doesn't get stuck.  She has had meat, bread, and pills to get stuck in past.  Had heartburn in past, but is not on medication now.  She has begun to have heartburn 2-3 times per week again.  She drinks sweet milk and eating ice cream (2 spoons) for relief.  Doesn't use Rolaids, but has them at home per her daughter-in-law.  Pt  expresses frustration because she has seen Dr. Laural Benes for this 4 times in past without anything to do for it.    Seizure disorder No seizures in "5 years or more" as long as she takes 2 tablets dilantin 100 mg tablets at night before bed. She does not have a neurologist locally, but had last been seen by a neurologist in [place], [place] (records in care everywhere.  Social History  Substance Use Topics  . Smoking status: Current Every Day Smoker    Packs/day: 1.00    Years: 50.00    Types: Cigarettes  . Smokeless tobacco: Never Used  . Alcohol use No     Review of Systems  Constitutional: Negative.   HENT: Positive for trouble swallowing.   Eyes: Negative.   Respiratory: Positive for cough and shortness of breath.   Cardiovascular: Negative.   Gastrointestinal: Negative.   Endocrine: Negative.   Genitourinary: Negative.   Musculoskeletal: Positive for arthralgias, back pain, myalgias, neck pain and neck stiffness.  Skin: Negative.   Allergic/Immunologic: Negative.   Neurological: Negative.   Hematological: Negative.   Psychiatric/Behavioral: Negative for suicidal ideas. The patient is nervous/anxious.    Per HPI unless specifically indicated above     Objective:    BP (!) 137/57 (BP Location: Right Arm, Patient Position: Sitting, Cuff Size: Large)   Pulse 68  Temp 98.1 F (36.7 C) (Oral)   Resp 16   Wt 163 lb (73.9 kg)   SpO2 100%   BMI 32.92 kg/m    Wt Readings from Last 3 Encounters:  06/18/16 163 lb (73.9 kg)  05/29/16 168 lb 12.8 oz (76.6 kg)  04/25/16 170 lb (77.1 kg)    Physical Exam  Constitutional: She is oriented to person, place, and time. She appears well-developed and well-nourished. No distress.  HENT:  Head: Normocephalic and atraumatic.  Right Ear: External ear normal.  Left Ear: External ear normal.  Nose: Nose normal.  Mouth/Throat: Oropharynx is clear and moist.  Eyes: Conjunctivae are normal. Pupils are equal, round, and reactive to light.   Neck: No JVD present. Muscular tenderness present. No spinous process tenderness present. Decreased range of motion present. No tracheal deviation present. No Brudzinski's sign and no Kernig's sign noted. No thyromegaly present.  Neck: Inspection: trapezius muscle prominent Palpation: no bony tenderness, muscle tenderness of paraspinal muscles and trapezius bilaterally ROM: decreased extension only able to move head to 180 degrees (straight), flexion normal, lateral bend to R decreased with angle between ear and shoulder of 75 degrees, lateral bend to L decreased angle between ear and shoulder 50 degrees Special Testing: none Strength: normal against resistance Neurovascular: normal sensation   Cardiovascular: Normal rate, regular rhythm, normal heart sounds and intact distal pulses.   Pulmonary/Chest: Effort normal. No accessory muscle usage. No respiratory distress. She has decreased breath sounds in the right lower field and the left lower field. She has rhonchi in the right lower field and the left lower field.  Abdominal: Soft. Bowel sounds are normal. There is no tenderness.  Musculoskeletal: She exhibits no edema.  Lymphadenopathy:    She has no cervical adenopathy.  Neurological: She is alert and oriented to person, place, and time. She has normal reflexes. No cranial nerve deficit.  Skin: Skin is warm and dry.  Psychiatric: She has a normal mood and affect. Her behavior is normal. Thought content normal. Her speech is rapid and/or pressured. She expresses no homicidal and no suicidal ideation.         Assessment & Plan:   Problem List Items Addressed This Visit      Musculoskeletal and Integument   Arthritis of low back Patient previously on vicodin for low back pain.  Requests refill of vicodin for her arthritis.  Pt states she is unable to take acetaminophen r/t liver function on dilantin.  Also allergic to ibuprofen. Pain medication options limited.  Plan: 1. Treat neck  pain with baclofen.  Possible benefit for low back pain. 2. START tramadol 50 mg bid as needed for pain.  15 day supply provided.   Relevant Medications   baclofen (LIORESAL) 10 MG tablet   traMADol (ULTRAM) 50 MG tablet    Other Visit Diagnoses    Difficulty swallowing solids    -  Primary Patient with reports of having pills and solid food stuck in her throat or choking on them if not chewed very fine.  Plan: 1. Barium swallow with option for modified barium if aspiration present. 2. Consider Speech therapy for dysphagia or Gastroenterology for esophageal stricture.   Relevant Orders   DG Esophagus   Neck pain, bilateral     Acute muscle strain and muscle spasm.    Plan: 1. Treat muscle spasm with application of heat, taking baclofen 10 mg up to twice daily. 2. Referral for [facility] physical therapy. 3. No cervical x-ray indicated at  this time.  Possible we may ascertain presence of arthritis on barium swallow study.   Relevant Medications   baclofen (LIORESAL) 10 MG tablet   traMADol (ULTRAM) 50 MG tablet   Other Relevant Orders   Ambulatory referral to Home Health      Meds ordered this encounter  Medications  . DISCONTD: erythromycin ophthalmic ointment  . neomycin-polymyxin b-dexamethasone (MAXITROL) 3.5-10000-0.1 SUSP  . baclofen (LIORESAL) 10 MG tablet    Sig: Take 0.5 tablets (5 mg total) by mouth 3 (three) times daily.    Dispense:  24 tablet    Refill:  0    Order Specific Question:   Supervising Provider    Answer:   Smitty Cords [[id number]]  . traMADol (ULTRAM) 50 MG tablet    Sig: Take 1 tablet (50 mg total) by mouth 2 (two) times daily.    Dispense:  30 tablet    Refill:  0    Order Specific Question:   Supervising Provider    Answer:   Smitty Cords [[id number]]      Follow up plan: Return in about 4 weeks (around 07/16/2016).   [name], DNP, AGPCNP-BC Adult Gerontology Primary Care Nurse Practitioner [facility] 06/18/2016, 6:01 PM

## 2016-06-18 NOTE — Progress Notes (Signed)
I have reviewed this encounter including the documentation in this note and/or discussed this patient with the provider, [name], AGPCNP-BC. I am certifying that I agree with the content of this note as supervising physician.  Saralyn Pilar, DO [facility] 06/18/2016, 11:52 PM

## 2016-06-18 NOTE — Patient Instructions (Signed)
Sandy Daniel, Thank you for coming in to clinic today.  1. For your neck and back pain: - We are moving forward with home health physical therapy for your neck. - START baclofen 5 mg up to three times per day.  Muscle spasm. - START Tramadol 50 mg up to twice per day. Neck and back pain.  2. For your throat: - We have ordered a barium swallow.  This will show if there is any narrowing of your esophagus.  They will call you to schedule an appointment. - We may need speech therapy or a referral to GI depending on these results.  Please schedule a follow-up appointment with [name], AGNP in about 4 weeks (around 07/16/2016).  If you have any other questions or concerns, please feel free to call the clinic or send a message through MyChart. You may also schedule an earlier appointment if necessary.  [name], DNP, AGNP-BC Adult Gerontology Nurse Practitioner [facility]    Barium Swallow A barium swallow is an X-ray exam that is used to evaluate the area at the back of the throat (pharynx) and the tube that carries food and liquid from the mouth to the stomach (esophagus). For this exam, you will swallow a white chalky liquid called barium. X-rays are done while the barium passes through the areas that are being checked. The barium shows up well on X-rays, making it easier for your health care provider to see possible problems. A barium swallow may be done to check for various problems, such as:  Ulcers.  Tumors.  Inflammation of the esophagus.  Hiatal hernia. This is a condition in which the upper part of the stomach slides into the lower chest.  Scarring.  Blockages.  Problems with the muscular wall of the pharynx and esophagus. Your health care provider may recommend this procedure to help make a diagnosis if you have any of these symptoms:  Difficulty swallowing.  Chest pain that is not related to the heart.  Gastroesophageal reflux,  which is a backward flow of stomach contents into the esophagus.  Unexplained vomiting.  Severe indigestion. Tell a health care provider about:  Any allergies you have, especially allergies to contrast materials.  All medicines you are taking, including vitamins, herbs, eye drops, creams, and over-the-counter medicines.  Any blood disorders you have.  Any surgeries you have had.  Any medical conditions you have.  If you are pregnant or you think that you may be pregnant. What are the risks? Generally, this is a safe procedure. However, problems may occur, including:  Constipation.  Fecal impaction.  Exposure to radiation (a small amount).  Allergic reaction to the barium. This is rare. What happens before the procedure?  Follow your health care provider's instructions about eating or drinking restrictions.  Ask your health care provider about changing or stopping your regular medicines. This is especially important if you are taking diabetes medicines or blood thinners. What happens during the procedure?  You will be positioned on an X-ray table.  You will be asked to drink the liquid barium, which looks like a light-colored milkshake. You will probably drink the barium through a straw.  During the procedure, the X-ray table may be moved to a more upright angle. You may also be asked to shift your position on the table. This will allow your entire esophagus to be viewed.  The health care provider will watch the barium flow through your esophagus using a type of X-ray that  allows images to be viewed on a monitor in a movie-like sequence (fluoroscopy). X-ray images will also be stored for later viewing. The procedure may vary among health care providers and hospitals. What happens after the procedure?  Return to your normal activities and your normal diet as directed by your health care provider.  Your stool (feces) may be white or gray for 2-3 days until all of the  barium has passed out of your body in your stool. You may be given a laxative to take to help remove the barium from your body.  Your health care provider may recommend other things to help prevent constipation after this procedure, including:  Drinking enough fluid to keep your urine clear or pale yellow.  Eating foods that are high in fiber, such as fruits, vegetables, whole grains, and beans.  Call your health care provider if:  You have difficulty having bowel movements, or you are not able to have a bowel movement or to pass gas.  You have belly (abdominal) pain or swelling of your abdomen.  You have a fever.  It is your responsibility to obtain your test results. Ask your health care provider or the department performing the test when and how you will get your results. This information is not intended to replace advice given to you by your health care provider. Make sure you discuss any questions you have with your health care provider. Document Released: 06/12/2006 Document Revised: 07/07/2015 Document Reviewed: 11/10/2013 Elsevier Interactive Patient Education  2017 [employer].

## 2016-06-20 ENCOUNTER — Telehealth: Payer: Self-pay

## 2016-06-20 MED ORDER — QUETIAPINE FUMARATE 50 MG PO TABS: 50.0000 mg | ORAL_TABLET | Freq: Every day | ORAL | 3 refills | Status: DC

## 2016-06-20 MED ORDER — SERTRALINE HCL 100 MG PO TABS: 200.0000 mg | ORAL_TABLET | Freq: Every day | ORAL | 3 refills | Status: DC

## 2016-06-20 NOTE — Telephone Encounter (Signed)
PT called to get verbal for Patient 2 X week for 3 week and 1 x week for 3 week. He sees Seroquel, Pepcid, lorazepam in her list but didn't think she has it wanted [place] to inform.

## 2016-06-20 NOTE — Telephone Encounter (Signed)
Medication list updated and refills sent: Continue seroquel STOP pepcid unless needs OTC prn STOP lorazepam (unless patient requests to take it and come in for a visit for anxiety.  She stated she was fine and not having hallucinations yesterday at clinic.)   Provide verbal order from Dr. Kirtland Bouchard for the services requested by PT.

## 2016-06-22 ENCOUNTER — Telehealth: Payer: Self-pay

## 2016-06-22 ENCOUNTER — Other Ambulatory Visit: Payer: Self-pay | Admitting: Nurse Practitioner

## 2016-06-22 DIAGNOSIS — G8929 Other chronic pain: Secondary | ICD-10-CM

## 2016-06-22 DIAGNOSIS — M545 Low back pain, unspecified: Secondary | ICD-10-CM

## 2016-06-22 MED ORDER — MELOXICAM 7.5 MG PO TABS: 7.5000 mg | ORAL_TABLET | Freq: Every day | ORAL | 0 refills | Status: DC

## 2016-06-22 NOTE — Telephone Encounter (Signed)
Patient's daughter advised as below. Per Leotis Shames, pt will need to be seen by pain clinic in order to have oxycodone refilled. sd

## 2016-06-22 NOTE — Telephone Encounter (Signed)
Pt last received a fill on 05/23/2016 as prescribed by Dr. Olevia Perches.  She received 60 pills which was a 30 day supply.    Pt cannot receive any additional oxycodone until 5/14 at the earliest.  Also, pt has been seen and discussed that we will not use oxycodone for arthritic pain from this clinic.  I have placed a referral to the pain clinic at [facility] for future refills.  I will try mobic 7.5 mg once daily. There is a potential for cross-sensitivity with ibuprofen allergies.  Her ibuprofen allergy was mild to intolerance and not a true allergy, so this may be okay. Take with food.

## 2016-06-22 NOTE — Progress Notes (Signed)
Pt pharmacy states ibuprofen is lip/tongue swelling and difficulty breathing.  D/C mobic.

## 2016-06-22 NOTE — Telephone Encounter (Signed)
Nurse called and reports that Sandy Daniel is not beena able to take Tramadol 50 mg, pt reports nausea and stomach ache after taking medication. Patient is requesting Oxycodone 5 mg which she has taken and tolerated well in the past.

## 2016-06-28 ENCOUNTER — Ambulatory Visit: Payer: Medicare HMO | Admitting: Family Medicine

## 2016-07-03 ENCOUNTER — Other Ambulatory Visit: Payer: Self-pay | Admitting: Family Medicine

## 2016-07-11 ENCOUNTER — Emergency Department: Payer: MEDICARE

## 2016-07-11 ENCOUNTER — Inpatient Hospital Stay
Admission: EM | Admit: 2016-07-11 | Discharge: 2016-07-13 | DRG: 190 | Disposition: A | Discharge status: Home / Self Care | Payer: MEDICARE | Attending: Internal Medicine | Admitting: Internal Medicine

## 2016-07-11 ENCOUNTER — Inpatient Hospital Stay (HOSPITAL_COMMUNITY)
Admit: 2016-07-11 | Discharge: 2016-07-11 | Disposition: A | Discharge status: Home / Self Care | Payer: MEDICARE | Attending: Internal Medicine | Admitting: Internal Medicine

## 2016-07-11 ENCOUNTER — Encounter: Payer: Self-pay | Admitting: Emergency Medicine

## 2016-07-11 DIAGNOSIS — Z79899 Other long term (current) drug therapy: Secondary | ICD-10-CM | POA: Diagnosis not present

## 2016-07-11 DIAGNOSIS — J440 Chronic obstructive pulmonary disease with acute lower respiratory infection: Secondary | ICD-10-CM | POA: Diagnosis not present

## 2016-07-11 DIAGNOSIS — K219 Gastro-esophageal reflux disease without esophagitis: Secondary | ICD-10-CM | POA: Diagnosis present

## 2016-07-11 DIAGNOSIS — F329 Major depressive disorder, single episode, unspecified: Secondary | ICD-10-CM | POA: Diagnosis not present

## 2016-07-11 DIAGNOSIS — G40909 Epilepsy, unspecified, not intractable, without status epilepticus: Secondary | ICD-10-CM | POA: Diagnosis present

## 2016-07-11 DIAGNOSIS — I509 Heart failure, unspecified: Secondary | ICD-10-CM | POA: Diagnosis present

## 2016-07-11 DIAGNOSIS — H3530 Unspecified macular degeneration: Secondary | ICD-10-CM | POA: Diagnosis not present

## 2016-07-11 DIAGNOSIS — Z888 Allergy status to other drugs, medicaments and biological substances status: Secondary | ICD-10-CM | POA: Diagnosis not present

## 2016-07-11 DIAGNOSIS — R1310 Dysphagia, unspecified: Secondary | ICD-10-CM | POA: Diagnosis not present

## 2016-07-11 DIAGNOSIS — Z951 Presence of aortocoronary bypass graft: Secondary | ICD-10-CM | POA: Diagnosis not present

## 2016-07-11 DIAGNOSIS — R627 Adult failure to thrive: Secondary | ICD-10-CM | POA: Diagnosis present

## 2016-07-11 DIAGNOSIS — I2510 Atherosclerotic heart disease of native coronary artery without angina pectoris: Secondary | ICD-10-CM | POA: Diagnosis not present

## 2016-07-11 DIAGNOSIS — I252 Old myocardial infarction: Secondary | ICD-10-CM

## 2016-07-11 DIAGNOSIS — J441 Chronic obstructive pulmonary disease with (acute) exacerbation: Principal | ICD-10-CM | POA: Diagnosis present

## 2016-07-11 DIAGNOSIS — J189 Pneumonia, unspecified organism: Secondary | ICD-10-CM | POA: Diagnosis present

## 2016-07-11 DIAGNOSIS — I5031 Acute diastolic (congestive) heart failure: Secondary | ICD-10-CM

## 2016-07-11 DIAGNOSIS — F17210 Nicotine dependence, cigarettes, uncomplicated: Secondary | ICD-10-CM | POA: Diagnosis not present

## 2016-07-11 DIAGNOSIS — I110 Hypertensive heart disease with heart failure: Secondary | ICD-10-CM | POA: Diagnosis present

## 2016-07-11 DIAGNOSIS — R0602 Shortness of breath: Secondary | ICD-10-CM | POA: Diagnosis present

## 2016-07-11 LAB — CBC WITH DIFFERENTIAL/PLATELET
BASOS ABS: 0 10*3/uL (ref 0–0.1)
Basophils Relative: 1 %
EOS PCT: 1 %
Eosinophils Absolute: 0.1 10*3/uL (ref 0–0.7)
HCT: 38.1 % (ref 35.0–47.0)
Hemoglobin: 12.7 g/dL (ref 12.0–16.0)
LYMPHS PCT: 24 %
Lymphs Abs: 1.4 10*3/uL (ref 1.0–3.6)
MCH: 29.7 pg (ref 26.0–34.0)
MCHC: 33.2 g/dL (ref 32.0–36.0)
MCV: 89.4 fL (ref 80.0–100.0)
Monocytes Absolute: 0.6 10*3/uL (ref 0.2–0.9)
Monocytes Relative: 11 %
NEUTROS ABS: 3.6 10*3/uL (ref 1.4–6.5)
Neutrophils Relative %: 63 %
PLATELETS: 270 10*3/uL (ref 150–440)
RBC: 4.26 MIL/uL (ref 3.80–5.20)
RDW: 16.7 % — ABNORMAL HIGH (ref 11.5–14.5)
WBC: 5.6 10*3/uL (ref 3.6–11.0)

## 2016-07-11 LAB — COMPREHENSIVE METABOLIC PANEL
ALT: 18 U/L (ref 14–54)
AST: 24 U/L (ref 15–41)
Albumin: 3.7 g/dL (ref 3.5–5.0)
Alkaline Phosphatase: 175 U/L — ABNORMAL HIGH (ref 38–126)
Anion gap: 8 (ref 5–15)
BUN: 24 mg/dL — AB (ref 6–20)
CHLORIDE: 107 mmol/L (ref 101–111)
CO2: 26 mmol/L (ref 22–32)
CREATININE: 0.76 mg/dL (ref 0.44–1.00)
Calcium: 9.2 mg/dL (ref 8.9–10.3)
GFR calc Af Amer: >60 mL/min (ref >60)
GLUCOSE: 98 mg/dL (ref 65–99)
Potassium: 4.2 mmol/L (ref 3.5–5.1)
Sodium: 141 mmol/L (ref 135–145)
Total Bilirubin: 0.6 mg/dL (ref 0.3–1.2)
Total Protein: 7.8 g/dL (ref 6.5–8.1)

## 2016-07-11 LAB — BRAIN NATRIURETIC PEPTIDE: B Natriuretic Peptide: 105 pg/mL — ABNORMAL HIGH (ref 0.0–100.0)

## 2016-07-11 LAB — FIBRIN DERIVATIVES D-DIMER (ARMC ONLY): Fibrin derivatives D-dimer (ARMC): 2359.92 — ABNORMAL HIGH (ref 0.00–499.00)

## 2016-07-11 LAB — MAGNESIUM: Magnesium: 2 mg/dL (ref 1.7–2.4)

## 2016-07-11 LAB — TROPONIN I

## 2016-07-11 MED ORDER — ACETAMINOPHEN 325 MG PO TABS: 650.0000 mg | ORAL_TABLET | Freq: Four times a day (QID) | ORAL | Status: DC | PRN

## 2016-07-11 MED ORDER — ONDANSETRON HCL 4 MG/2ML IJ SOLN: 4.0000 mg | Freq: Four times a day (QID) | INTRAMUSCULAR | Status: DC | PRN

## 2016-07-11 MED ORDER — DOCUSATE SODIUM 100 MG PO CAPS
100.0000 mg | ORAL_CAPSULE | Freq: Two times a day (BID) | ORAL | Status: DC
  Administered 2016-07-11 – 2016-07-13 (×4): 100 mg via ORAL
  Filled 2016-07-11 (×4): qty 1

## 2016-07-11 MED ORDER — LORAZEPAM 0.5 MG PO TABS: 0.5000 mg | ORAL_TABLET | Freq: Two times a day (BID) | ORAL | Status: DC | PRN

## 2016-07-11 MED ORDER — ATORVASTATIN CALCIUM 10 MG PO TABS
10.0000 mg | ORAL_TABLET | Freq: Every day | ORAL | Status: DC
  Administered 2016-07-11 – 2016-07-13 (×3): 10 mg via ORAL
  Filled 2016-07-11 (×3): qty 1

## 2016-07-11 MED ORDER — ALBUTEROL SULFATE (2.5 MG/3ML) 0.083% IN NEBU: 2.5000 mg | INHALATION_SOLUTION | RESPIRATORY_TRACT | Status: DC | PRN

## 2016-07-11 MED ORDER — ACETAMINOPHEN 650 MG RE SUPP: 650.0000 mg | Freq: Four times a day (QID) | RECTAL | Status: DC | PRN

## 2016-07-11 MED ORDER — LEVOFLOXACIN IN D5W 750 MG/150ML IV SOLN
750.0000 mg | Freq: Once | INTRAVENOUS | Status: AC
  Administered 2016-07-11: 750 mg via INTRAVENOUS
  Filled 2016-07-11: qty 150

## 2016-07-11 MED ORDER — IPRATROPIUM-ALBUTEROL 0.5-2.5 (3) MG/3ML IN SOLN
3.0000 mL | Freq: Four times a day (QID) | RESPIRATORY_TRACT | Status: DC
  Administered 2016-07-12 (×4): 3 mL via RESPIRATORY_TRACT
  Filled 2016-07-11 (×4): qty 3

## 2016-07-11 MED ORDER — METHYLPREDNISOLONE SODIUM SUCC 125 MG IJ SOLR
60.0000 mg | Freq: Once | INTRAMUSCULAR | Status: AC
  Administered 2016-07-11: 60 mg via INTRAVENOUS

## 2016-07-11 MED ORDER — POLYETHYLENE GLYCOL 3350 17 G PO PACK: 17.0000 g | PACK | Freq: Every day | ORAL | Status: DC | PRN

## 2016-07-11 MED ORDER — FUROSEMIDE 10 MG/ML IJ SOLN
40.0000 mg | Freq: Once | INTRAMUSCULAR | Status: AC
  Administered 2016-07-11: 40 mg via INTRAVENOUS

## 2016-07-11 MED ORDER — IOPAMIDOL (ISOVUE-370) INJECTION 76%
75.0000 mL | Freq: Once | INTRAVENOUS | Status: AC | PRN
  Administered 2016-07-11: 75 mL via INTRAVENOUS

## 2016-07-11 MED ORDER — IPRATROPIUM-ALBUTEROL 0.5-2.5 (3) MG/3ML IN SOLN
3.0000 mL | RESPIRATORY_TRACT | Status: DC
  Administered 2016-07-11 (×2): 3 mL via RESPIRATORY_TRACT
  Filled 2016-07-11 (×2): qty 3

## 2016-07-11 MED ORDER — FUROSEMIDE 10 MG/ML IJ SOLN
INTRAMUSCULAR | Status: AC
  Filled 2016-07-11: qty 4

## 2016-07-11 MED ORDER — SERTRALINE HCL 100 MG PO TABS
100.0000 mg | ORAL_TABLET | Freq: Every day | ORAL | Status: DC
  Administered 2016-07-12 – 2016-07-13 (×2): 100 mg via ORAL
  Filled 2016-07-11 (×2): qty 1

## 2016-07-11 MED ORDER — QUETIAPINE FUMARATE 25 MG PO TABS
25.0000 mg | ORAL_TABLET | Freq: Every day | ORAL | Status: DC
  Administered 2016-07-11 – 2016-07-12 (×2): 25 mg via ORAL
  Filled 2016-07-11 (×2): qty 1

## 2016-07-11 MED ORDER — HYDROCODONE-ACETAMINOPHEN 5-325 MG PO TABS
1.0000 | ORAL_TABLET | ORAL | Status: DC | PRN
  Administered 2016-07-12: 1 via ORAL
  Administered 2016-07-13: 2 via ORAL
  Filled 2016-07-11: qty 1
  Filled 2016-07-11: qty 2

## 2016-07-11 MED ORDER — ONDANSETRON HCL 4 MG PO TABS: 4.0000 mg | ORAL_TABLET | Freq: Four times a day (QID) | ORAL | Status: DC | PRN

## 2016-07-11 MED ORDER — LEVOFLOXACIN IN D5W 750 MG/150ML IV SOLN
750.0000 mg | INTRAVENOUS | Status: DC
  Filled 2016-07-11: qty 150

## 2016-07-11 MED ORDER — CEFTRIAXONE SODIUM IN DEXTROSE 20 MG/ML IV SOLN
1.0000 g | Freq: Once | INTRAVENOUS | Status: DC
  Filled 2016-07-11: qty 50

## 2016-07-11 MED ORDER — SODIUM CHLORIDE 0.9 % IV BOLUS (SEPSIS)
500.0000 mL | Freq: Once | INTRAVENOUS | Status: AC
  Administered 2016-07-11: 500 mL via INTRAVENOUS

## 2016-07-11 MED ORDER — PHENYTOIN SODIUM EXTENDED 100 MG PO CAPS
200.0000 mg | ORAL_CAPSULE | Freq: Every day | ORAL | Status: DC
  Administered 2016-07-11 – 2016-07-12 (×2): 200 mg via ORAL
  Filled 2016-07-11 (×4): qty 2

## 2016-07-11 MED ORDER — BENZONATATE 100 MG PO CAPS
100.0000 mg | ORAL_CAPSULE | Freq: Three times a day (TID) | ORAL | Status: DC | PRN
  Administered 2016-07-12 (×2): 100 mg via ORAL
  Filled 2016-07-11 (×2): qty 1

## 2016-07-11 MED ORDER — METHYLPREDNISOLONE SODIUM SUCC 125 MG IJ SOLR
60.0000 mg | Freq: Two times a day (BID) | INTRAMUSCULAR | Status: DC
  Administered 2016-07-11 – 2016-07-12 (×2): 60 mg via INTRAVENOUS
  Filled 2016-07-11 (×3): qty 2

## 2016-07-11 MED ORDER — GUAIFENESIN 100 MG/5ML PO SOLN
5.0000 mL | Freq: Once | ORAL | Status: AC
  Administered 2016-07-11: 100 mg via ORAL
  Filled 2016-07-11: qty 5

## 2016-07-11 MED ORDER — CARVEDILOL 3.125 MG PO TABS
3.1250 mg | ORAL_TABLET | Freq: Every day | ORAL | Status: DC
  Administered 2016-07-11 – 2016-07-13 (×2): 3.125 mg via ORAL
  Filled 2016-07-11 (×3): qty 1

## 2016-07-11 MED ORDER — DEXTROSE 5 % IV SOLN: 500.0000 mg | Freq: Once | INTRAVENOUS | Status: DC

## 2016-07-11 MED ORDER — ENOXAPARIN SODIUM 40 MG/0.4ML ~~LOC~~ SOLN
40.0000 mg | SUBCUTANEOUS | Status: DC
  Administered 2016-07-11 – 2016-07-12 (×2): 40 mg via SUBCUTANEOUS
  Filled 2016-07-11 (×2): qty 0.4

## 2016-07-11 MED ORDER — IPRATROPIUM-ALBUTEROL 0.5-2.5 (3) MG/3ML IN SOLN
3.0000 mL | Freq: Once | RESPIRATORY_TRACT | Status: AC
  Administered 2016-07-11: 3 mL via RESPIRATORY_TRACT
  Filled 2016-07-11: qty 3

## 2016-07-11 MED ORDER — HYDROCOD POLST-CPM POLST ER 10-8 MG/5ML PO SUER
5.0000 mL | Freq: Two times a day (BID) | ORAL | Status: DC
  Administered 2016-07-11 – 2016-07-13 (×5): 5 mL via ORAL
  Filled 2016-07-11 (×5): qty 5

## 2016-07-11 NOTE — ED Triage Notes (Signed)
Patient presents to the ED with increased cough, congestion and shortness of breath x 4 days.  Patient very lethargic in triage, occasionally falling asleep between questions.  Patient was sent by pcp who told patient that she may have CHF and needed to be evaluated in the ED.  Patient reports coughing up clear phlegm.  Patient also has swelling in her lower legs which is also new.  Patient reports having difficulty sleeping while lying down.

## 2016-07-11 NOTE — Progress Notes (Signed)
Pt arrived from the ER via stretcher at approx 1530. Pt incontinent of urine on arrival, further incontinent during transfer to bedside commode. Initially, pt was found on the stretcher moaning and complaining of a muscle spasm to her L leg, clutching the staff member who transported her from pain. When asked, pt stated that these episodes have been happening for the past several months. Pt was cleaned up and returned to bed, has steady gait with handholds.. Pt is on room air, lungs clear bilat, pt has infrequent dry cough. Abdomen is soft, bs heard. Pt has +1 pitting edema to bilat le's, feet are cool. piv #20 intact to l fa with iv infusing. This finished and site was saline locked. Since arrival, pt has voided multiple times. Pt told this writer that the scabs located ot her back, scalp, bilat legs/knees, back of her R ear,etc were the result of pt having "pins" erupting through her skin that she must pull out. Pt stated that doctors have told her she is hallucinating about this. Pt also reports a 70 pound weight loss over the past 9 months. Pt has tolerated dysphasia 3 diet well, has had no complaints of sob, remains on room air. Pt oriented to room and call bell.

## 2016-07-11 NOTE — ED Notes (Signed)
Lab called to add on mag and BNP

## 2016-07-11 NOTE — ED Notes (Signed)
Pt sent from PCP office for SOB and cough, states chest xray was preformed and it showed an enlarged heart and infiltrate, hx of MI and CHF, states increased lower ext swelling

## 2016-07-11 NOTE — ED Provider Notes (Signed)
[facility] Emergency Department Provider Note    First MD Initiated Contact with Patient 07/11/16 1125     (approximate)  I have reviewed the triage vital signs and the nursing notes.   HISTORY  Chief Complaint Shortness of Breath    HPI Sandy Daniel is a 83 y.o. female with a history of COPD as well as CAD presents with 2 weeks of worsening shortness of breath that became acutely worse 24 hours ago. Patient was seen by her PCP today and directed to the ER due to concern for heart failure. Her symptoms have included worsening orthopnea as well as exertional dyspnea. She denies that she's been having a productive cough but no fevers. Just speaking to her in obtaining history the patient is having difficulty speaking in more than one or 2 word phrases. She denies any active chest pain at this point. Does state that her left leg has been more swollen than usual. He does not wear oxygen at home.   Past Medical History:  Diagnosis Date  . Arthritis   . Asthma   . B12 deficiency   . CAD (coronary artery disease)   . COPD (chronic obstructive pulmonary disease) (HCC)   . Depression   . Epilepsy (HCC)   . GERD (gastroesophageal reflux disease)   . Hyperlipidemia   . Hypertension   . Hypoxemia   . Macular degeneration (senile) of retina   . Macular degeneration of both eyes   . MI (myocardial infarction) (HCC) 09/2003, 05/2013  . Physiological tremor   . Presence of stent of CABG    x2   . Seizures (HCC)    Family History  Problem Relation Age of Onset  . Diabetes Mother   . Cancer Sister   . Diabetes Sister   . Heart disease Sister   . Heart attack Sister   . Hyperlipidemia Sister   . Hypertension Sister   . Cancer Brother   . Diabetes Brother   . Heart disease Brother   . Heart attack Brother   . Hyperlipidemia Brother   . Hypertension Brother   . Diabetes Brother   . Diabetes Brother   . Kidney failure Brother    Past Surgical  History:  Procedure Laterality Date  . ABDOMINAL HYSTERECTOMY    . APPENDECTOMY    . CARDIAC CATHETERIZATION  05/2013  . CARDIAC SURGERY    . CATARACT EXTRACTION  03/2016  . CHOLECYSTECTOMY  1962  . CORONARY ANGIOPLASTY WITH STENT PLACEMENT  Aug. 2005 and March 2015  . ROTATOR CUFF REPAIR Right   . VENTRAL HERNIA REPAIR  2002   Patient Active Problem List   Diagnosis Date Noted  . COPD exacerbation (HCC) 07/11/2016  . Hallucinations 06/07/2016  . Self-mutilation 06/07/2016  . Controlled substance agreement signed 03/27/2016  . Dysphagia 03/26/2016  . Panic disorder 03/26/2016  . CAD (coronary artery disease)   . Seizure disorder (HCC)   . Macular degeneration of both eyes   . B12 deficiency   . COPD (chronic obstructive pulmonary disease) (HCC)   . HTN (hypertension)   . Hyperlipidemia   . Arthritis   . Depression   . GERD (gastroesophageal reflux disease)   . Nasal congestion 04/02/2015  . Increased weakness when ambulating 06/21/2014  . Physiological tremor 02/18/2014  . Chronic headache 12/08/2012      Prior to Admission medications   Medication Sig Start Date End Date Taking? Authorizing Provider  atorvastatin (LIPITOR) 10 MG tablet Take 1 tablet (  10 mg total) by mouth daily. 05/29/16  Yes Johnson, Megan P, DO  carvedilol (COREG) 3.125 MG tablet Take 1 tablet (3.125 mg total) by mouth daily. 03/26/16  Yes Johnson, Megan P, DO  LORazepam (ATIVAN) 0.5 MG tablet Take 1 tablet (0.5 mg total) by mouth 2 (two) times daily as needed for anxiety. 05/29/16  Yes Johnson, Megan P, DO  phenytoin (DILANTIN) 100 MG ER capsule Take 2 capsules (200 mg total) by mouth at bedtime. Take 2 by mouth at bedtime 03/26/16  Yes Johnson, Megan P, DO  QUEtiapine (SEROQUEL) 50 MG tablet Take 1 tablet (50 mg total) by mouth at bedtime. Patient taking differently: Take 25 mg by mouth at bedtime.  06/20/16  Yes Galen Manila, NP  sertraline (ZOLOFT) 100 MG tablet Take 2 tablets (200 mg total) by  mouth daily. Patient taking differently: Take 100 mg by mouth daily.  06/20/16  Yes Galen Manila, NP  albuterol (PROVENTIL HFA;VENTOLIN HFA) 108 (90 Base) MCG/ACT inhaler Inhale 2 puffs into the lungs every 6 (six) hours as needed for wheezing or shortness of breath. 04/03/16   Steele Sizer, MD  albuterol (PROVENTIL) (2.5 MG/3ML) 0.083% nebulizer solution Take 3 mLs (2.5 mg total) by nebulization every 6 (six) hours as needed for Wheezing. 03/26/16   Johnson, Megan P, DO  lidocaine (LIDODERM) 5 % Place 1 patch onto the skin daily. Remove & Discard patch within 12 hours or as directed by MD 05/29/16   Olevia Perches P, DO  mupirocin cream (BACTROBAN) 2 % Apply 1 application topically 2 (two) times daily. 05/29/16   Olevia Perches P, DO  neomycin-polymyxin b-dexamethasone (MAXITROL) 3.5-10000-0.1 SUSP  06/14/16   [provider]  nitroGLYCERIN (NITROSTAT) 0.4 MG SL tablet Place 1 tablet (0.4 mg total) under the tongue every 5 (five) minutes as needed for chest pain. 03/26/16   Johnson, Megan P, DO  Tiotropium Bromide-Olodaterol (STIOLTO RESPIMAT) 2.5-2.5 MCG/ACT AERS Inhale 2.5 sprays into the lungs every morning. Patient not taking: Reported on 07/11/2016 03/26/16   Olevia Perches P, DO  traMADol (ULTRAM) 50 MG tablet Take 1 tablet (50 mg total) by mouth 2 (two) times daily. Patient not taking: Reported on 07/11/2016 06/18/16   Galen Manila, NP    Allergies Motrin [ibuprofen] and Tramadol hcl    Social History Social History  Substance Use Topics  . Smoking status: Current Every Day Smoker    Packs/day: 1.00    Years: 50.00    Types: Cigarettes  . Smokeless tobacco: Never Used  . Alcohol use No    Review of Systems Patient denies headaches, rhinorrhea, blurry vision, numbness, shortness of breath, chest pain, edema, cough, abdominal pain, nausea, vomiting, diarrhea, dysuria, fevers, rashes or hallucinations unless otherwise stated above in  HPI. ____________________________________________   PHYSICAL EXAM:  VITAL SIGNS: Vitals:   07/11/16 1500 07/11/16 1530  BP: (!) 133/58 (!) 153/69  Pulse: 64 67  Resp: (!) 22 (!) 23  Temp:      Constitutional: Alert and oriented. In moderate respiratory distress Eyes: Conjunctivae are normal.  Head: Atraumatic. Nose: No congestion/rhinnorhea. Mouth/Throat: Mucous membranes are moist.   Neck: No stridor. Painless ROM.  Cardiovascular: Normal rate, regular rhythm. Grossly normal heart sounds.  Good peripheral circulation. Respiratory: tachypnea with use of accessory muscles and speaking in short phrases.  Inspiratory crackles in bilatera bases with diminished breathsounds throughout, occasional wheeze Gastrointestinal: Soft and nontender. No distention. No abdominal bruits. No CVA tenderness. Genitourinary:  Musculoskeletal:bilatearl L>R LE edema Neurologic:  Normal speech and language. No gross focal neurologic deficits are appreciated. No facial droop Skin:  Skin is warm, dry and intact. No rash noted. Psychiatric: Mood and affect are normal. Speech and behavior are normal.  ____________________________________________   LABS (all labs ordered are listed, but only abnormal results are displayed)  Results for orders placed or performed during the hospital encounter of 07/11/16 (from the past 24 hour(s))  CBC with Differential     Status: Abnormal   Collection Time: 07/11/16 11:06 AM  Result Value Ref Range   WBC 5.6 3.6 - 11.0 K/uL   RBC 4.26 3.80 - 5.20 MIL/uL   Hemoglobin 12.7 12.0 - 16.0 g/dL   HCT 38.4 66.5 - 99.3 %   MCV 89.4 80.0 - 100.0 fL   MCH 29.7 26.0 - 34.0 pg   MCHC 33.2 32.0 - 36.0 g/dL   RDW 57.0 (H) 17.7 - 93.9 %   Platelets 270 150 - 440 K/uL   Neutrophils Relative % 63 %   Neutro Abs 3.6 1.4 - 6.5 K/uL   Lymphocytes Relative 24 %   Lymphs Abs 1.4 1.0 - 3.6 K/uL   Monocytes Relative 11 %   Monocytes Absolute 0.6 0.2 - 0.9 K/uL   Eosinophils  Relative 1 %   Eosinophils Absolute 0.1 0 - 0.7 K/uL   Basophils Relative 1 %   Basophils Absolute 0.0 0 - 0.1 K/uL  Comprehensive metabolic panel     Status: Abnormal   Collection Time: 07/11/16 11:06 AM  Result Value Ref Range   Sodium 141 135 - 145 mmol/L   Potassium 4.2 3.5 - 5.1 mmol/L   Chloride 107 101 - 111 mmol/L   CO2 26 22 - 32 mmol/L   Glucose, Bld 98 65 - 99 mg/dL   BUN 24 (H) 6 - 20 mg/dL   Creatinine, Ser 0.30 0.44 - 1.00 mg/dL   Calcium 9.2 8.9 - 09.2 mg/dL   Total Protein 7.8 6.5 - 8.1 g/dL   Albumin 3.7 3.5 - 5.0 g/dL   AST 24 15 - 41 U/L   ALT 18 14 - 54 U/L   Alkaline Phosphatase 175 (H) 38 - 126 U/L   Total Bilirubin 0.6 0.3 - 1.2 mg/dL   GFR calc non Af Amer >60 >60 mL/min   GFR calc Af Amer >60 >60 mL/min   Anion gap 8 5 - 15  Troponin I     Status: None   Collection Time: 07/11/16 11:06 AM  Result Value Ref Range   Troponin I <0.03 <0.03 ng/mL  Brain natriuretic peptide     Status: Abnormal   Collection Time: 07/11/16 11:06 AM  Result Value Ref Range   B Natriuretic Peptide 105.0 (H) 0.0 - 100.0 pg/mL  Magnesium     Status: None   Collection Time: 07/11/16 11:06 AM  Result Value Ref Range   Magnesium 2.0 1.7 - 2.4 mg/dL  Fibrin derivatives D-Dimer ([facility] only)     Status: Abnormal   Collection Time: 07/11/16 11:06 AM  Result Value Ref Range   Fibrin derivatives D-dimer ([facility]) 2,359.92 (H) 0.00 - 499.00   ____________________________________________  EKG My review and personal interpretation at Time: 10:49   Indication: sob  Rate: 65  Rhythm: sinus Axis: normal Other: normal intervals, no STEMI ____________________________________________  RADIOLOGY  I personally reviewed all radiographic images ordered to evaluate for the above acute complaints and reviewed radiology reports and findings.  These findings were personally discussed with the patient.  Please see  medical record for radiology  report.  ____________________________________________   PROCEDURES  Procedure(s) performed:  Procedures    Critical Care performed: no ____________________________________________   INITIAL IMPRESSION / ASSESSMENT AND PLAN / ED COURSE  Pertinent labs & imaging results that were available during my care of the patient were reviewed by me and considered in my medical decision making (see chart for details).  DDX: Asthma, copd, CHF, pna, ptx, malignancy, Pe, anemia   Sandy Daniel is a 83 y.o. who presents to the ED with Cough congestion shortness of breath as described above. She is afebrile in the ER but is tachypnea can in moderate respiratory distress. Concern for the above differential.  Will give nebulizer treatments. No evidence of acute ischemia on EKG. The patient will be placed on continuous pulse oximetry and telemetry for monitoring.  Laboratory evaluation will be sent to evaluate for the above complaints.     Clinical Course as of Jul 12 2006  Wed Jul 11, 2016  1221 Potassium: 4.2 [PR]  1313 Ultrasound without any evidence of DVT. Based on markedly elevated d-dimer and acute shortness of breath without significant findings of congestive heart failure will order CT to evaluate for pulmonary embolism.  [PR]    Clinical Course User Index [PR] Willy Eddy, MD    CT without any evidence of pulmonary embolism but does show evidence of pneumonia. Ace on her frailty confusion in moderate respiratory distress to feel patient will require admission for further evaluation and management. ____________________________________________   FINAL CLINICAL IMPRESSION(S) / ED DIAGNOSES  Final diagnoses:  SOB (shortness of breath)  COPD exacerbation (HCC)  Pneumonia due to infectious organism, unspecified laterality, unspecified part of lung      NEW MEDICATIONS STARTED DURING THIS VISIT:  Current Discharge Medication List       Note:  This document was  prepared using Dragon voice recognition software and may include unintentional dictation errors.    Willy Eddy, MD 07/11/16 2008

## 2016-07-11 NOTE — H&P (Signed)
[facility] - [place] at [facility]   PATIENT NAME: Sandy Daniel    MR#:  [id number]  DATE OF BIRTH:  February 19, 1928  DATE OF ADMISSION:  07/11/2016  PRIMARY CARE PHYSICIAN: Galen Manila, NP   REQUESTING/REFERRING PHYSICIAN: Dr. Roxan Hockey  CHIEF COMPLAINT:   Chief Complaint  Patient presents with  . Shortness of Breath    HISTORY OF PRESENT ILLNESS:  Sandy Daniel  is a 83 y.o. female with a known history of CAD, hypertension, COPD, tobacco abuse, seizures, chronic pain syndrome presents to the emergency room sent by primary care physician F the patient has had one week of cough, lower extremity swelling. She also complains of orthopnea. A chest x-ray was done as outpatient which was normal. Here in the emergency room she has had a CTA of the chest which showed no pulmonary embolism. Does show left upper lobe pneumonia. No pulmonary edema. No history of CHF.  PAST MEDICAL HISTORY:   Past Medical History:  Diagnosis Date  . Arthritis   . Asthma   . B12 deficiency   . CAD (coronary artery disease)   . COPD (chronic obstructive pulmonary disease) (HCC)   . Depression   . Epilepsy (HCC)   . GERD (gastroesophageal reflux disease)   . Hyperlipidemia   . Hypertension   . Hypoxemia   . Macular degeneration (senile) of retina   . Macular degeneration of both eyes   . MI (myocardial infarction) (HCC) 09/2003, 05/2013  . Physiological tremor   . Presence of stent of CABG    x2   . Seizures (HCC)     PAST SURGICAL HISTORY:   Past Surgical History:  Procedure Laterality Date  . ABDOMINAL HYSTERECTOMY    . APPENDECTOMY    . CARDIAC CATHETERIZATION  05/2013  . CARDIAC SURGERY    . CATARACT EXTRACTION  03/2016  . CHOLECYSTECTOMY  1962  . CORONARY ANGIOPLASTY WITH STENT PLACEMENT  Aug. 2005 and March 2015  . ROTATOR CUFF REPAIR Right   . VENTRAL HERNIA REPAIR  2002    SOCIAL HISTORY:   Social History  Substance Use Topics  . Smoking status:  Current Every Day Smoker    Packs/day: 1.00    Years: 50.00    Types: Cigarettes  . Smokeless tobacco: Never Used  . Alcohol use No    FAMILY HISTORY:   Family History  Problem Relation Age of Onset  . Diabetes Mother   . Cancer Sister   . Diabetes Sister   . Heart disease Sister   . Heart attack Sister   . Hyperlipidemia Sister   . Hypertension Sister   . Cancer Brother   . Diabetes Brother   . Heart disease Brother   . Heart attack Brother   . Hyperlipidemia Brother   . Hypertension Brother   . Diabetes Brother   . Diabetes Brother   . Kidney failure Brother     DRUG ALLERGIES:   Allergies  Allergen Reactions  . Motrin [Ibuprofen] Shortness Of Breath and Swelling    Swelling of lip/tongue  . Tramadol Hcl Nausea And Vomiting    REVIEW OF SYSTEMS:   Review of Systems  Constitutional: Positive for malaise/fatigue. Negative for chills and fever.  HENT: Negative for sore throat.   Eyes: Negative for blurred vision, double vision and pain.  Respiratory: Positive for cough and shortness of breath. Negative for hemoptysis and wheezing.   Cardiovascular: Positive for orthopnea and leg swelling. Negative for chest pain and palpitations.  Gastrointestinal: Negative for abdominal pain, constipation, diarrhea, heartburn, nausea and vomiting.  Genitourinary: Negative for dysuria and hematuria.  Musculoskeletal: Positive for back pain. Negative for joint pain.  Skin: Negative for rash.  Neurological: Positive for weakness. Negative for sensory change, speech change, focal weakness and headaches.  Endo/Heme/Allergies: Does not bruise/bleed easily.  Psychiatric/Behavioral: Negative for depression. The patient is nervous/anxious.     MEDICATIONS AT HOME:   Prior to Admission medications   Medication Sig Start Date End Date Taking? Authorizing Provider  atorvastatin (LIPITOR) 10 MG tablet Take 1 tablet (10 mg total) by mouth daily. 05/29/16  Yes Johnson, Megan P, DO   carvedilol (COREG) 3.125 MG tablet Take 1 tablet (3.125 mg total) by mouth daily. 03/26/16  Yes Johnson, Megan P, DO  LORazepam (ATIVAN) 0.5 MG tablet Take 1 tablet (0.5 mg total) by mouth 2 (two) times daily as needed for anxiety. 05/29/16  Yes Johnson, Megan P, DO  phenytoin (DILANTIN) 100 MG ER capsule Take 2 capsules (200 mg total) by mouth at bedtime. Take 2 by mouth at bedtime 03/26/16  Yes Johnson, Megan P, DO  QUEtiapine (SEROQUEL) 50 MG tablet Take 1 tablet (50 mg total) by mouth at bedtime. Patient taking differently: Take 25 mg by mouth at bedtime.  06/20/16  Yes Galen Manila, NP  sertraline (ZOLOFT) 100 MG tablet Take 2 tablets (200 mg total) by mouth daily. Patient taking differently: Take 100 mg by mouth daily.  06/20/16  Yes Galen Manila, NP  albuterol (PROVENTIL HFA;VENTOLIN HFA) 108 (90 Base) MCG/ACT inhaler Inhale 2 puffs into the lungs every 6 (six) hours as needed for wheezing or shortness of breath. 04/03/16   Steele Sizer, MD  albuterol (PROVENTIL) (2.5 MG/3ML) 0.083% nebulizer solution Take 3 mLs (2.5 mg total) by nebulization every 6 (six) hours as needed for Wheezing. 03/26/16   Johnson, Megan P, DO  lidocaine (LIDODERM) 5 % Place 1 patch onto the skin daily. Remove & Discard patch within 12 hours or as directed by MD 05/29/16   Olevia Perches P, DO  mupirocin cream (BACTROBAN) 2 % Apply 1 application topically 2 (two) times daily. 05/29/16   Olevia Perches P, DO  neomycin-polymyxin b-dexamethasone (MAXITROL) 3.5-10000-0.1 SUSP  06/14/16   [provider]  nitroGLYCERIN (NITROSTAT) 0.4 MG SL tablet Place 1 tablet (0.4 mg total) under the tongue every 5 (five) minutes as needed for chest pain. 03/26/16   Johnson, Megan P, DO  Tiotropium Bromide-Olodaterol (STIOLTO RESPIMAT) 2.5-2.5 MCG/ACT AERS Inhale 2.5 sprays into the lungs every morning. Patient not taking: Reported on 07/11/2016 03/26/16   Olevia Perches P, DO  traMADol (ULTRAM) 50 MG tablet Take 1 tablet  (50 mg total) by mouth 2 (two) times daily. Patient not taking: Reported on 07/11/2016 06/18/16   Galen Manila, NP     VITAL SIGNS:  Blood pressure (!) 132/59, pulse 63, temperature 97.7 F (36.5 C), temperature source Oral, resp. rate 16, height 4\' 11"  (1.499 m), weight 72.6 kg (160 lb), SpO2 96 %.  PHYSICAL EXAMINATION:  Physical Exam  GENERAL:  83 y.o.-year-old patient lying in the bed with Conversational dyspnea and severe cough EYES: Pupils equal, round, reactive to light and accommodation. No scleral icterus. Extraocular muscles intact.  HEENT: Head atraumatic, normocephalic. Oropharynx and nasopharynx clear. No oropharyngeal erythema, moist oral mucosa  NECK:  Supple, no jugular venous distention. No thyroid enlargement, no tenderness.  LUNGS: Increased work of breathing. Tachypnea. Bilateral wheezing and decreased air entry. CARDIOVASCULAR: S1, S2 normal.  No murmurs, rubs, or gallops.  ABDOMEN: Soft, nontender, nondistended. Bowel sounds present. No organomegaly or mass.  EXTREMITIES: No pedal edema, cyanosis, or clubbing. + 2 pedal & radial pulses b/l.   NEUROLOGIC: Cranial nerves II through XII are intact. No focal Motor or sensory deficits appreciated b/l PSYCHIATRIC: The patient is alert and oriented x 3. Extremely anxious SKIN: No obvious rash, lesion, or ulcer.   LABORATORY PANEL:   CBC  Recent Labs Lab 07/11/16 1106  WBC 5.6  HGB 12.7  HCT 38.1  PLT 270   ------------------------------------------------------------------------------------------------------------------  Chemistries   Recent Labs Lab 07/11/16 1106  NA 141  K 4.2  CL 107  CO2 26  GLUCOSE 98  BUN 24*  CREATININE 0.76  CALCIUM 9.2  MG 2.0  AST 24  ALT 18  ALKPHOS 175*  BILITOT 0.6   ------------------------------------------------------------------------------------------------------------------  Cardiac Enzymes  Recent Labs Lab 07/11/16 1106  TROPONINI <0.03    ------------------------------------------------------------------------------------------------------------------  RADIOLOGY:  Dg Chest 2 View  Result Date: 07/11/2016 CLINICAL DATA:  Shortness of breath and cough. EXAM: CHEST  2 VIEW COMPARISON:  02/05/2016 FINDINGS: Lungs appear hyperexpanded. Subsegmental atelectasis noted left base. No evidence for pulmonary edema or pleural effusion. The cardio pericardial silhouette is enlarged. Bones are diffusely demineralized. Telemetry leads overlie the chest. IMPRESSION: Left basilar atelectasis.  No acute cardiopulmonary findings. Electronically Signed   By: Kennith Center M.D.   On: 07/11/2016 11:39   Ct Angio Chest Pe W Or Wo Contrast  Result Date: 07/11/2016 CLINICAL DATA:  Shortness of breath and lower extremity edema EXAM: CT ANGIOGRAPHY CHEST WITH CONTRAST TECHNIQUE: Multidetector CT imaging of the chest was performed using the standard protocol during bolus administration of intravenous contrast. Multiplanar CT image reconstructions and MIPs were obtained to evaluate the vascular anatomy. CONTRAST:  75 mL Isovue 370 nonionic COMPARISON:  Chest radiograph Jul 11, 2016 FINDINGS: Cardiovascular: There is no appreciable pulmonary embolus. There is no thoracic aortic aneurysm or dissection. There is moderate calcification in the proximal left subclavian artery. There is moderate calcification in the right proximal common carotid artery. There are foci of atherosclerotic calcification in the aorta. There are foci of coronary artery calcification at multiple sites. There is a small pericardial effusion. Mediastinum/Nodes: Thyroid appears unremarkable. There is no appreciable thoracic adenopathy. Lungs/Pleura: There is focal airspace consolidation in a portion of the posterior segment of the left upper lobe inferiorly. There is atelectatic change in both lung bases. On axial slice 50 series 6, there is a 2 mm nodular opacity in the posterior segment of the  right upper lobe. There is no appreciable pleural effusion. Upper Abdomen: There is atherosclerotic calcification in the aorta. Visualized upper abdominal structures otherwise appear normal. Musculoskeletal: There is degenerative change in the thoracic spine. There is thoracic dextroscoliosis. There are no blastic or lytic bone lesions. There is moderate anterior wedging of the T5 vertebral body. Review of the MIP images confirms the above findings. IMPRESSION: No demonstrable pulmonary embolus. Focal area of airspace consolidation consistent with pneumonia in the inferior aspect of the posterior segment of the left upper lobe. There is bibasilar atelectasis. No appreciable adenopathy. Foci of atherosclerotic calcification including multiple foci of coronary artery calcification. Small pericardial effusion. Moderate wedging of the T5 vertebral body. Electronically Signed   By: Bretta Bang III M.D.   On: 07/11/2016 13:46   US Venous Img Lower Unilateral Left  Result Date: 07/11/2016 CLINICAL DATA:  Left leg swelling.  Shortness of breath EXAM: LEFT LOWER EXTREMITY  VENOUS DOPPLER ULTRASOUND TECHNIQUE: Gray-scale sonography with graded compression, as well as color Doppler and duplex ultrasound were performed to evaluate the lower extremity deep venous systems from the level of the common femoral vein and including the common femoral, femoral, profunda femoral, popliteal and calf veins including the posterior tibial, peroneal and gastrocnemius veins when visible. The superficial great saphenous vein was also interrogated. Spectral Doppler was utilized to evaluate flow at rest and with distal augmentation maneuvers in the common femoral, femoral and popliteal veins. COMPARISON:  None. FINDINGS: Contralateral Common Femoral Vein: Respiratory phasicity is normal and symmetric with the symptomatic side. No evidence of thrombus. Normal compressibility. Common Femoral Vein: No evidence of thrombus. Saphenofemoral  Junction: No evidence of thrombus. Profunda Femoral Vein: No evidence of thrombus. Femoral Vein: No evidence of thrombus. Popliteal Vein: No evidence of thrombus. Calf Veins: No evidence of thrombus. IMPRESSION: No evidence of DVT within the left lower extremity. Electronically Signed   By: Marnee Spring M.D.   On: 07/11/2016 13:07   IMPRESSION AND PLAN:   * Left upper lobe pneumonia with COPD exacerbation -IV steroids, Antibiotics - Scheduled Nebulizers - Inhalers -Wean O2 as tolerated - Consult pulmonary if no improvement  * Chf? Patient symptoms seem typical for heart failure. But no pulmonary edema on chest x-ray or CT scan of the chest. Mild elevation in BNP. We'll give her 1 dose of Lasix. Reassess in the morning. Monitor input and output. Check echocardiogram.   Her main issue with the breathing seems to be COPD exacerbation at this time.  * Dysphagia Swallow eval Will likely need EGD as OP when acute illness is better.  * CAD s/p CABG Continue home meds Coreg and statin  * Tobacco abuse Counseled to quit > 3 minutes  * Seizures On phenytoin  * Chronic pain Was on Vicodin in the past Restart  * DVT prophylaxis Lovenox  All the records are reviewed and case discussed with ED provider. Management plans discussed with the patient, family and they are in agreement.  CODE STATUS: Full Code  TOTAL TIME TAKING CARE OF THIS PATIENT: 40 minutes.   Milagros Loll R M.D on 07/11/2016 at 2:17 PM  Between 7am to 6pm - Pager - [phone]  After 6pm go to [email or web address] - password [email or web address] [facility]  [facility] [place] [facility]  Office  [phone]  CC: Primary care physician; Galen Manila, NP  Note: This dictation was prepared with Dragon dictation along with smaller phrase technology. Any transcriptional errors that result from this process are unintentional.

## 2016-07-11 NOTE — Progress Notes (Signed)
Pharmacy Antibiotic Note  Sandy Daniel is a 83 y.o. female admitted on 07/11/2016 with pneumonia (CAP).  Pharmacy has been consulted for levofloxacin dosing.  Plan: Levofloxacin 750 mg IV q48h based on current renal function.   Height: 4\' 11"  (149.9 cm) Weight: 160 lb (72.6 kg) IBW/kg (Calculated) : 43.2  Temp (24hrs), Avg:97.7 F (36.5 C), Min:97.7 F (36.5 C), Max:97.7 F (36.5 C)   Recent Labs Lab 07/11/16 1106  WBC 5.6  CREATININE 0.76    Estimated Creatinine Clearance: 43.8 mL/min (by C-G formula based on SCr of 0.76 mg/dL).    Allergies  Allergen Reactions  . Motrin [Ibuprofen] Shortness Of Breath and Swelling    Swelling of lip/tongue  . Tramadol Hcl Nausea And Vomiting    Thank you for allowing pharmacy to be a part of this patient's care.  Marty Heck 07/11/2016 2:43 PM

## 2016-07-11 NOTE — ED Notes (Signed)
Lab called to add on d-dimer by [place] rn

## 2016-07-12 DIAGNOSIS — R0602 Shortness of breath: Secondary | ICD-10-CM | POA: Diagnosis not present

## 2016-07-12 DIAGNOSIS — J441 Chronic obstructive pulmonary disease with (acute) exacerbation: Secondary | ICD-10-CM | POA: Diagnosis not present

## 2016-07-12 LAB — CBC
HCT: 33.7 % — ABNORMAL LOW (ref 35.0–47.0)
Hemoglobin: 11.5 g/dL — ABNORMAL LOW (ref 12.0–16.0)
MCH: 30.9 pg (ref 26.0–34.0)
MCHC: 34.2 g/dL (ref 32.0–36.0)
MCV: 90.3 fL (ref 80.0–100.0)
PLATELETS: 226 10*3/uL (ref 150–440)
RBC: 3.73 MIL/uL — ABNORMAL LOW (ref 3.80–5.20)
RDW: 16.7 % — ABNORMAL HIGH (ref 11.5–14.5)
WBC: 5.8 10*3/uL (ref 3.6–11.0)

## 2016-07-12 LAB — BASIC METABOLIC PANEL
Anion gap: 6 (ref 5–15)
BUN: 22 mg/dL — ABNORMAL HIGH (ref 6–20)
CHLORIDE: 107 mmol/L (ref 101–111)
CO2: 28 mmol/L (ref 22–32)
Calcium: 8.6 mg/dL — ABNORMAL LOW (ref 8.9–10.3)
Creatinine, Ser: 0.85 mg/dL (ref 0.44–1.00)
GFR calc Af Amer: >60 mL/min (ref >60)
GFR calc non Af Amer: >60 mL/min (ref >60)
GLUCOSE: 76 mg/dL (ref 65–99)
Potassium: 3.7 mmol/L (ref 3.5–5.1)
Sodium: 141 mmol/L (ref 135–145)

## 2016-07-12 LAB — ECHOCARDIOGRAM COMPLETE
HEIGHTINCHES: 59 in
WEIGHTICAEL: 2560 [oz_av]

## 2016-07-12 MED ORDER — GABAPENTIN 100 MG PO CAPS
100.0000 mg | ORAL_CAPSULE | Freq: Two times a day (BID) | ORAL | Status: DC
  Administered 2016-07-12 – 2016-07-13 (×3): 100 mg via ORAL
  Filled 2016-07-12 (×3): qty 1

## 2016-07-12 MED ORDER — MAGNESIUM HYDROXIDE 400 MG/5ML PO SUSP
15.0000 mL | Freq: Once | ORAL | Status: AC
  Administered 2016-07-12: 15 mL via ORAL
  Filled 2016-07-12: qty 30

## 2016-07-12 MED ORDER — NICOTINE 14 MG/24HR TD PT24
14.0000 mg | MEDICATED_PATCH | Freq: Every day | TRANSDERMAL | Status: DC
Start: 2016-07-12 — End: 2016-07-13
  Administered 2016-07-12 – 2016-07-13 (×2): 14 mg via TRANSDERMAL
  Filled 2016-07-12 (×2): qty 1

## 2016-07-12 MED ORDER — ENSURE ENLIVE PO LIQD: 237.0000 mL | Freq: Two times a day (BID) | ORAL | Status: DC

## 2016-07-12 MED ORDER — METHYLPREDNISOLONE SODIUM SUCC 40 MG IJ SOLR
40.0000 mg | Freq: Two times a day (BID) | INTRAMUSCULAR | Status: DC
  Administered 2016-07-12 – 2016-07-13 (×2): 40 mg via INTRAVENOUS
  Filled 2016-07-12 (×2): qty 1

## 2016-07-12 MED ORDER — PANTOPRAZOLE SODIUM 40 MG PO TBEC
40.0000 mg | DELAYED_RELEASE_TABLET | Freq: Every day | ORAL | Status: DC
  Administered 2016-07-12 – 2016-07-13 (×2): 40 mg via ORAL
  Filled 2016-07-12 (×2): qty 1

## 2016-07-12 NOTE — Progress Notes (Addendum)
[facility] - [place] at [facility]   PATIENT NAME: Sandy Daniel    MR#:  [id number]  DATE OF BIRTH:  03-09-1928  SUBJECTIVE:  CHIEF COMPLAINT:  Shortness of breath is better than yesterday still coughing  REVIEW OF SYSTEMS:  CONSTITUTIONAL: No fever, fatigue or weakness.  EYES: No blurred or double vision.  EARS, NOSE, AND THROAT: No tinnitus or ear pain.  RESPIRATORY: Reports productive cough, denies shortness of breath, wheezing or hemoptysis.  CARDIOVASCULAR: No chest pain, orthopnea, edema.  GASTROINTESTINAL: No nausea, vomiting, diarrhea or abdominal pain.  GENITOURINARY: No dysuria, hematuria.  ENDOCRINE: No polyuria, nocturia,  HEMATOLOGY: No anemia, easy bruising or bleeding SKIN: No rash or lesion. MUSCULOSKELETAL: No joint pain or arthritis.   NEUROLOGIC: No tingling, numbness, weakness.  PSYCHIATRY: No anxiety or depression.   DRUG ALLERGIES:   Allergies  Allergen Reactions  . Motrin [Ibuprofen] Shortness Of Breath and Swelling    Swelling of lip/tongue  . Tramadol Hcl Nausea And Vomiting    VITALS:  Blood pressure (!) 103/43, pulse (!) 54, temperature 98.7 F (37.1 C), resp. rate 18, height 4\' 11"  (1.499 m), weight 69.1 kg (152 lb 6.4 oz), SpO2 90 %.  PHYSICAL EXAMINATION:  GENERAL:  83 y.o.-year-old patient lying in the bed with no acute distress.  EYES: Pupils equal, round, reactive to light and accommodation. No scleral icterus. Extraocular muscles intact.  HEENT: Head atraumatic, normocephalic. Oropharynx and nasopharynx clear.  NECK:  Supple, no jugular venous distention. No thyroid enlargement, no tenderness.  LUNGS: Moderate breath sounds bilaterally, left-sided crackles, no wheezing, rales,rhonchi  No use of accessory muscles of respiration.  CARDIOVASCULAR: S1, S2 normal. No murmurs, rubs, or gallops.  ABDOMEN: Soft, nontender, nondistended. Bowel sounds present. No organomegaly or mass.  EXTREMITIES: No pedal edema,  cyanosis, or clubbing.  NEUROLOGIC: Cranial nerves II through XII are intact. Muscle strength 5/5 in all extremities. Sensation intact. Gait not checked.  PSYCHIATRIC: The patient is alert and oriented x 3.  SKIN: No obvious rash, lesion, or ulcer.    LABORATORY PANEL:   CBC  Recent Labs Lab 07/12/16 0415  WBC 5.8  HGB 11.5*  HCT 33.7*  PLT 226   ------------------------------------------------------------------------------------------------------------------  Chemistries   Recent Labs Lab 07/11/16 1106 07/12/16 0415  NA 141 141  K 4.2 3.7  CL 107 107  CO2 26 28  GLUCOSE 98 76  BUN 24* 22*  CREATININE 0.76 0.85  CALCIUM 9.2 8.6*  MG 2.0  --   AST 24  --   ALT 18  --   ALKPHOS 175*  --   BILITOT 0.6  --    ------------------------------------------------------------------------------------------------------------------  Cardiac Enzymes  Recent Labs Lab 07/11/16 1106  TROPONINI <0.03   ------------------------------------------------------------------------------------------------------------------  RADIOLOGY:  Dg Chest 2 View  Result Date: 07/11/2016 CLINICAL DATA:  Shortness of breath and cough. EXAM: CHEST  2 VIEW COMPARISON:  02/05/2016 FINDINGS: Lungs appear hyperexpanded. Subsegmental atelectasis noted left base. No evidence for pulmonary edema or pleural effusion. The cardio pericardial silhouette is enlarged. Bones are diffusely demineralized. Telemetry leads overlie the chest. IMPRESSION: Left basilar atelectasis.  No acute cardiopulmonary findings. Electronically Signed   By: Kennith Center M.D.   On: 07/11/2016 11:39   Ct Angio Chest Pe W Or Wo Contrast  Addendum Date: 07/11/2016   ADDENDUM REPORT: 07/11/2016 14:33 ADDENDUM: There is a 2 mm nodular lesion in the right upper lobe. No follow-up needed if patient is low-risk. Non-contrast chest CT can be considered in 12  months if patient is high-risk. This recommendation follows the consensus statement:  Guidelines for Management of Incidental Pulmonary Nodules Detected on CT Images: From the Fleischner Society 2017; Radiology 2017; 284:228-243. Electronically Signed   By: Bretta Bang III M.D.   On: 07/11/2016 14:33   Result Date: 07/11/2016 CLINICAL DATA:  Shortness of breath and lower extremity edema EXAM: CT ANGIOGRAPHY CHEST WITH CONTRAST TECHNIQUE: Multidetector CT imaging of the chest was performed using the standard protocol during bolus administration of intravenous contrast. Multiplanar CT image reconstructions and MIPs were obtained to evaluate the vascular anatomy. CONTRAST:  75 mL Isovue 370 nonionic COMPARISON:  Chest radiograph Jul 11, 2016 FINDINGS: Cardiovascular: There is no appreciable pulmonary embolus. There is no thoracic aortic aneurysm or dissection. There is moderate calcification in the proximal left subclavian artery. There is moderate calcification in the right proximal common carotid artery. There are foci of atherosclerotic calcification in the aorta. There are foci of coronary artery calcification at multiple sites. There is a small pericardial effusion. Mediastinum/Nodes: Thyroid appears unremarkable. There is no appreciable thoracic adenopathy. Lungs/Pleura: There is focal airspace consolidation in a portion of the posterior segment of the left upper lobe inferiorly. There is atelectatic change in both lung bases. On axial slice 50 series 6, there is a 2 mm nodular opacity in the posterior segment of the right upper lobe. There is no appreciable pleural effusion. Upper Abdomen: There is atherosclerotic calcification in the aorta. Visualized upper abdominal structures otherwise appear normal. Musculoskeletal: There is degenerative change in the thoracic spine. There is thoracic dextroscoliosis. There are no blastic or lytic bone lesions. There is moderate anterior wedging of the T5 vertebral body. Review of the MIP images confirms the above findings. IMPRESSION: No demonstrable  pulmonary embolus. Focal area of airspace consolidation consistent with pneumonia in the inferior aspect of the posterior segment of the left upper lobe. There is bibasilar atelectasis. No appreciable adenopathy. Foci of atherosclerotic calcification including multiple foci of coronary artery calcification. Small pericardial effusion. Moderate wedging of the T5 vertebral body. Electronically Signed: By: Bretta Bang III M.D. On: 07/11/2016 13:46   US Venous Img Lower Unilateral Left  Result Date: 07/11/2016 CLINICAL DATA:  Left leg swelling.  Shortness of breath EXAM: LEFT LOWER EXTREMITY VENOUS DOPPLER ULTRASOUND TECHNIQUE: Gray-scale sonography with graded compression, as well as color Doppler and duplex ultrasound were performed to evaluate the lower extremity deep venous systems from the level of the common femoral vein and including the common femoral, femoral, profunda femoral, popliteal and calf veins including the posterior tibial, peroneal and gastrocnemius veins when visible. The superficial great saphenous vein was also interrogated. Spectral Doppler was utilized to evaluate flow at rest and with distal augmentation maneuvers in the common femoral, femoral and popliteal veins. COMPARISON:  None. FINDINGS: Contralateral Common Femoral Vein: Respiratory phasicity is normal and symmetric with the symptomatic side. No evidence of thrombus. Normal compressibility. Common Femoral Vein: No evidence of thrombus. Saphenofemoral Junction: No evidence of thrombus. Profunda Femoral Vein: No evidence of thrombus. Femoral Vein: No evidence of thrombus. Popliteal Vein: No evidence of thrombus. Calf Veins: No evidence of thrombus. IMPRESSION: No evidence of DVT within the left lower extremity. Electronically Signed   By: Marnee Spring M.D.   On: 07/11/2016 13:07    EKG:   Orders placed or performed during the hospital encounter of 07/11/16  . EKG 12-Lead  . EKG 12-Lead  . ED EKG  . ED EKG     ASSESSMENT AND PLAN:    *  Left upper lobe pneumonia with COPD exacerbation - Taper IV steroids, Antibiotics - Scheduled Nebulizers - Inhalers -Wean O2 as tolerated - Consult pulmonary if no improvement  * Chf - ruled out with normal echocardiogram Patient symptoms seem typical for heart failure. But no pulmonary edema on chest x-ray or CT scan of the chest. Mild elevation in BNP. One dose of Lasix was given. Echocardiogram with normal ejection fraction .    * Dysphagia/failure to thrive Swallow eval-dysphagia 3 diet ordered Needs outpatient EGD if she could tolerate diet dysphagia 3 Outpatient follow-up by primary care physician for further evaluation-60 pounds weight loss in 9 months  * CAD s/p CABG Continue home meds Coreg and statin  * Tobacco abuse Counseled to quit smoking for 6 minutes. Patient is agreeable we will provide nicotine patch  * Seizures On phenytoin  * Chronic pain Was on Vicodin in the past Restart  * DVT prophylaxis Lovenox  Deconditioning with PT evaluation   All the records are reviewed and case discussed with Care Management/Social Workerr. Management plans discussed with the patient, family and they are in agreement.  CODE STATUS: fc   TOTAL TIME TAKING CARE OF THIS PATIENT: 36 minutes.   POSSIBLE D/C IN 2  DAYS, DEPENDING ON CLINICAL CONDITION.  Note: This dictation was prepared with Dragon dictation along with smaller phrase technology. Any transcriptional errors that result from this process are unintentional.   Ramonita Lab M.D on 07/12/2016 at 9:21 AM  Between 7am to 6pm - Pager - [phone] After 6pm go to [email or web address] - password [email or web address]  [place] [facility]  Office  [phone]  CC: Primary care physician; Galen Manila, NP

## 2016-07-12 NOTE — Progress Notes (Signed)
Initial Nutrition Assessment  DOCUMENTATION CODES:   Not applicable  INTERVENTION:  1. Ensure Enlive po BID, each supplement provides 350 kcal and 20 grams of protein  NUTRITION DIAGNOSIS:   Inadequate oral intake related to dysphagia, acute illness as evidenced by per patient/family report.  GOAL:   Patient will meet greater than or equal to 90% of their needs  MONITOR:   PO intake, Supplement acceptance, I & O's, Labs, Weight trends  REASON FOR ASSESSMENT:   Malnutrition Screening Tool    ASSESSMENT:   Sandy Daniel  is a 83 y.o. female with a known history of CAD, hypertension, COPD, tobacco abuse, seizures, chronic pain syndrome presents to the emergency room with lower extremity swelling  Patient sleeping comfortably during visit. Per chart, patient exhibits a 35#/18% severe wt loss over 5 months. Appears patient has had gradual and persistent weight loss. Patient has history of anxiety and depression along with dysphagia - likely causes of weight loss. She also fell a few months ago. Has hallucinations. PO intake thus far 75% for breakfast and lunch.  Unable to complete Nutrition-Focused physical exam at this time.  Unable to diagnose malnutrition at this time. Labs and medications reviewed: Colace, Solumedrol,  Diet Order:  DIET DYS 3 Room service appropriate? Yes with Assist; Fluid consistency: Thin  Skin:  Reviewed, no issues  Last BM:  07/10/2016  Height:   Ht Readings from Last 1 Encounters:  07/11/16 4\' 11"  (1.499 m)    Weight:   Wt Readings from Last 1 Encounters:  07/12/16 152 lb 6.4 oz (69.1 kg)    Ideal Body Weight:  43.18 kg  BMI:  Body mass index is 30.78 kg/m.  Estimated Nutritional Needs:   Kcal:  1400-1600 calories  Protein:  82-97 grams  Fluid:  >/= 1.4L  EDUCATION NEEDS:   No education needs identified at this time  Dionne Ano. Kelan Pritt, MS, RD LDN Inpatient Clinical Dietitian Pager [phone]

## 2016-07-12 NOTE — Progress Notes (Signed)
Patient alert and oriented, vss, no complaints of pain. NSR on monitor.  Patient resting comfortably.

## 2016-07-12 NOTE — Progress Notes (Signed)
PT Cancellation Note  Patient Details Name: Sandy Daniel MRN: [id number] DOB: [birth year]   Cancelled Treatment:    Reason Eval/Treat Not Completed: Patient declined, no reason specified.  Pt firmly refusing physical therapy and any out of bed mobility today (nursing notified).  Will re-attempt PT eval at a later date/time.  Hendricks Limes, PT 07/12/16, 1:18 PM [phone]

## 2016-07-12 NOTE — Evaluation (Addendum)
Clinical/Bedside Swallow Evaluation Patient Details  Name: Sandy Daniel MRN: [id number] Date of Birth: August 05, 1928  Today's Date: 07/12/2016 Time: SLP Start Time (ACUTE ONLY): 1115 SLP Stop Time (ACUTE ONLY): 1215 SLP Time Calculation (min) (ACUTE ONLY): 60 min  Past Medical History:  Past Medical History:  Diagnosis Date  . Arthritis   . Asthma   . B12 deficiency   . CAD (coronary artery disease)   . COPD (chronic obstructive pulmonary disease) (HCC)   . Depression   . Epilepsy (HCC)   . GERD (gastroesophageal reflux disease)   . Hyperlipidemia   . Hypertension   . Hypoxemia   . Macular degeneration (senile) of retina   . Macular degeneration of both eyes   . MI (myocardial infarction) (HCC) 09/2003, 05/2013  . Physiological tremor   . Presence of stent of CABG    x2   . Seizures (HCC)    Past Surgical History:  Past Surgical History:  Procedure Laterality Date  . ABDOMINAL HYSTERECTOMY    . APPENDECTOMY    . CARDIAC CATHETERIZATION  05/2013  . CARDIAC SURGERY    . CATARACT EXTRACTION  03/2016  . CHOLECYSTECTOMY  1962  . CORONARY ANGIOPLASTY WITH STENT PLACEMENT  Aug. 2005 and March 2015  . ROTATOR CUFF REPAIR Right   . VENTRAL HERNIA REPAIR  2002   HPI:   Pt is a 83 y.o. female with a known history of CAD, hypertension, COPD, tobacco abuse, seizures, chronic pain syndrome presents to the emergency room sent by primary care physician F the patient has had one week of cough, lower extremity swelling. She also complains of orthopnea. A chest x-ray was done as outpatient which was normal. Here in the emergency room she has had a CTA of the chest which showed no pulmonary embolism. Does show left upper lobe pneumonia. Pt does have multiple medical issues and has a long-standing h/o GERD (on a PPI in the past) per pt report. She is currently NOT taking a PPI and exhibits s/s of Reflux; also reports using increased TUMS at home.   Assessment / Plan /  Recommendation Clinical Impression  Pt appears at reduced risk for aspiration following general aspiration precautions; no overt pharyngeal phase dysphagia noted during this evaluation. Pt stated she has to "chew w/ my front teeth" and masticates her foods "well". She stated she avoids most meats. Pt does have a h/o GERD/esophageal dysphagia baseline. Any Esophageal phase dysmotility and/or retrograde activity of bolus material could increase risk for aspiration of Reflux material thus impact the Pulmonary status. Pt stated she was not on her PPI at this time but has been taking a PPI regularly in the past. Pt also exhibits min increased SOB/WOB w/ increased exertion of eating/drinking and talking - rest breaks were instructed b/t bites and sips to avoid such. Recommend a slightly modified diet for easier mastication and possibly easier Esophageal motility/clearing. Discussed general aspiration precautions and Reflux precautions w/ pt and family present. Recommend f/u w/ GI for management of Reflux, PPI. MD to initiate a PPI post consult.   SLP Visit Diagnosis: Dysphagia, pharyngoesophageal phase (R13.14) (Esophageal phase dysphagia)    Aspiration Risk   (reduced risk when following aspiration precautions)    Diet Recommendation  Dysphagia level 3(cut meats, moistened foods); Thin liquids.  General aspiration precautions; Reflux precautions  Medication Administration: Whole meds with puree (and meds Crushed with puree as pt desires)    Other  Recommendations Recommended Consults: Consider GI evaluation (Dietician consult) Oral Care Recommendations:  Oral care BID;Patient independent with oral care;Staff/trained caregiver to provide oral care   Follow up Recommendations None      Frequency and Duration            Prognosis Prognosis for Safe Diet Advancement: Fair (-Good) Barriers to Reach Goals:  (Esophageal phase deficits/dysphagia baseline)      Swallow Study   General Date of Onset:  07/11/16 Type of Study: Bedside Swallow Evaluation Previous Swallow Assessment: none indicated; GI  Diet Prior to this Study: Regular;Thin liquids (but avoids meats per pt report) Temperature Spikes Noted: No (wbc 5.8) Respiratory Status: Room air History of Recent Intubation: No Behavior/Cognition: Alert;Cooperative;Pleasant mood (HOH; vision deficits baseline; min SOB w/ talking) Oral Cavity Assessment: Within Functional Limits Oral Care Completed by SLP: Recent completion by staff Oral Cavity - Dentition: Dentures, top;Dentures, bottom Vision: Functional for self-feeding Self-Feeding Abilities: Able to feed self;Needs assist;Needs set up Patient Positioning: Upright in bed Baseline Vocal Quality: Normal;Low vocal intensity (min) Volitional Cough: Strong Volitional Swallow: Able to elicit    Oral/Motor/Sensory Function Overall Oral Motor/Sensory Function:  (min tremors of lower mandible)   Ice Chips Ice chips: Not tested   Thin Liquid Thin Liquid: Within functional limits Presentation: Cup;Self Fed;Straw (5-6 trials via each) Other Comments: min SOB/WOB w/ exertion of trials towards the end - rest break instructed    Nectar Thick Nectar Thick Liquid: Not tested   Honey Thick Honey Thick Liquid: Not tested   Puree Puree: Within functional limits Presentation: Self Fed;Spoon (4-5 trials) Other Comments: min SOB/WOB w/ exertion of trials towards the end - rest break instructed   Solid   GO   Solid: Impaired (mech soft consistency) Presentation: Self Fed (8-9 pieces) Oral Phase Impairments: Impaired mastication (min) Oral Phase Functional Implications: Impaired mastication (min) Pharyngeal Phase Impairments:  (none) Other Comments: pt stated she has to "chew w/ my front teeth more"; pt stated she avoids most meats and has had Esophageal phase dysmotility per MD/hart notes in past         Jerilynn Som, MS, CCC-SLP Watson,Katherine 07/12/2016,2:22 PM

## 2016-07-12 NOTE — Progress Notes (Signed)
Notified Dr. Amado Coe to discontinue patient's Enlive. Patient is refusing them because she "does not like them." Offered patient didn't flavors and still refused.

## 2016-07-13 DIAGNOSIS — R1310 Dysphagia, unspecified: Secondary | ICD-10-CM | POA: Diagnosis present

## 2016-07-13 DIAGNOSIS — J189 Pneumonia, unspecified organism: Secondary | ICD-10-CM | POA: Diagnosis present

## 2016-07-13 DIAGNOSIS — R0602 Shortness of breath: Secondary | ICD-10-CM | POA: Diagnosis present

## 2016-07-13 DIAGNOSIS — R627 Adult failure to thrive: Secondary | ICD-10-CM | POA: Diagnosis present

## 2016-07-13 DIAGNOSIS — I509 Heart failure, unspecified: Secondary | ICD-10-CM | POA: Diagnosis present

## 2016-07-13 DIAGNOSIS — I252 Old myocardial infarction: Secondary | ICD-10-CM | POA: Diagnosis not present

## 2016-07-13 DIAGNOSIS — Z79899 Other long term (current) drug therapy: Secondary | ICD-10-CM | POA: Diagnosis not present

## 2016-07-13 DIAGNOSIS — J440 Chronic obstructive pulmonary disease with acute lower respiratory infection: Secondary | ICD-10-CM | POA: Diagnosis present

## 2016-07-13 DIAGNOSIS — J441 Chronic obstructive pulmonary disease with (acute) exacerbation: Secondary | ICD-10-CM | POA: Diagnosis present

## 2016-07-13 DIAGNOSIS — Z951 Presence of aortocoronary bypass graft: Secondary | ICD-10-CM | POA: Diagnosis not present

## 2016-07-13 DIAGNOSIS — I110 Hypertensive heart disease with heart failure: Secondary | ICD-10-CM | POA: Diagnosis present

## 2016-07-13 DIAGNOSIS — I2510 Atherosclerotic heart disease of native coronary artery without angina pectoris: Secondary | ICD-10-CM | POA: Diagnosis present

## 2016-07-13 DIAGNOSIS — H3530 Unspecified macular degeneration: Secondary | ICD-10-CM | POA: Diagnosis present

## 2016-07-13 DIAGNOSIS — F17210 Nicotine dependence, cigarettes, uncomplicated: Secondary | ICD-10-CM | POA: Diagnosis present

## 2016-07-13 DIAGNOSIS — K219 Gastro-esophageal reflux disease without esophagitis: Secondary | ICD-10-CM | POA: Diagnosis present

## 2016-07-13 DIAGNOSIS — F329 Major depressive disorder, single episode, unspecified: Secondary | ICD-10-CM | POA: Diagnosis present

## 2016-07-13 DIAGNOSIS — G40909 Epilepsy, unspecified, not intractable, without status epilepticus: Secondary | ICD-10-CM | POA: Diagnosis present

## 2016-07-13 DIAGNOSIS — Z888 Allergy status to other drugs, medicaments and biological substances status: Secondary | ICD-10-CM | POA: Diagnosis not present

## 2016-07-13 MED ORDER — LEVOFLOXACIN 750 MG PO TABS
750.0000 mg | ORAL_TABLET | ORAL | Status: DC
  Filled 2016-07-13: qty 1

## 2016-07-13 MED ORDER — LEVOFLOXACIN 750 MG PO TABS: 750.0000 mg | ORAL_TABLET | ORAL | 0 refills | Status: DC

## 2016-07-13 MED ORDER — BENZONATATE 100 MG PO CAPS: 100.0000 mg | ORAL_CAPSULE | Freq: Three times a day (TID) | ORAL | 0 refills | Status: DC | PRN

## 2016-07-13 MED ORDER — IPRATROPIUM-ALBUTEROL 0.5-2.5 (3) MG/3ML IN SOLN
3.0000 mL | Freq: Two times a day (BID) | RESPIRATORY_TRACT | Status: DC
  Administered 2016-07-13: 3 mL via RESPIRATORY_TRACT

## 2016-07-13 MED ORDER — GABAPENTIN 100 MG PO CAPS: 100.0000 mg | ORAL_CAPSULE | Freq: Two times a day (BID) | ORAL | 0 refills | Status: DC

## 2016-07-13 MED ORDER — NICOTINE 14 MG/24HR TD PT24: 14.0000 mg | MEDICATED_PATCH | Freq: Every day | TRANSDERMAL | 0 refills | Status: DC

## 2016-07-13 MED ORDER — ENSURE ENLIVE PO LIQD: 237.0000 mL | Freq: Two times a day (BID) | ORAL | 1 refills | Status: DC

## 2016-07-13 MED ORDER — HYDROCOD POLST-CPM POLST ER 10-8 MG/5ML PO SUER: 5.0000 mL | Freq: Two times a day (BID) | ORAL | 0 refills | Status: DC

## 2016-07-13 MED ORDER — SERTRALINE HCL 100 MG PO TABS: 100.0000 mg | ORAL_TABLET | Freq: Every day | ORAL | 0 refills | Status: DC

## 2016-07-13 MED ORDER — PREDNISONE 10 MG (21) PO TBPK: 10.0000 mg | ORAL_TABLET | Freq: Every day | ORAL | 0 refills | Status: DC

## 2016-07-13 MED ORDER — PANTOPRAZOLE SODIUM 40 MG PO TBEC: 40.0000 mg | DELAYED_RELEASE_TABLET | Freq: Every day | ORAL | 0 refills | Status: DC

## 2016-07-13 NOTE — Care Management Important Message (Signed)
Important Message  Patient Details  Name: Sandy Daniel MRN: [id number] Date of Birth: 09-Jul-1928   Medicare Important Message Given:  Yes    Marily Memos, RN 07/13/2016, 9:14 AM

## 2016-07-13 NOTE — Discharge Summary (Signed)
[facility] - [place] [facility]   PATIENT NAME: Sandy Daniel    MR#:  [id number]  DATE OF BIRTH:  [birth year]  DATE OF ADMISSION:  07/11/2016 ADMITTING PHYSICIAN: Milagros Loll, MD  DATE OF DISCHARGE: 07/13/16 PRIMARY CARE PHYSICIAN: Galen Manila, NP    ADMISSION DIAGNOSIS:  SOB (shortness of breath) [R06.02] COPD exacerbation (HCC) [J44.1] Pneumonia due to infectious organism, unspecified laterality, unspecified part of lung [J18.9]  DISCHARGE DIAGNOSIS:  Active Problems:   COPD exacerbation (HCC)   SECONDARY DIAGNOSIS:   Past Medical History:  Diagnosis Date  . Arthritis   . Asthma   . B12 deficiency   . CAD (coronary artery disease)   . COPD (chronic obstructive pulmonary disease) (HCC)   . Depression   . Epilepsy (HCC)   . GERD (gastroesophageal reflux disease)   . Hyperlipidemia   . Hypertension   . Hypoxemia   . Macular degeneration (senile) of retina   . Macular degeneration of both eyes   . MI (myocardial infarction) (HCC) 09/2003, 05/2013  . Physiological tremor   . Presence of stent of CABG    x2   . Seizures [facility])     HOSPITAL COURSE:  HPI Sandy Daniel  is a 83 y.o. female with a known history of CAD, hypertension, COPD, tobacco abuse, seizures, chronic pain syndrome presents to the emergency room sent by primary care physician F the patient has had one week of cough, lower extremity swelling. She also complains of orthopnea. A chest x-ray was done as outpatient which was normal. Here in the emergency room she has had a CTA of the chest which showed no pulmonary embolism. Does show left upper lobe pneumonia. No pulmonary edema. No history of CHF.  * Left upper lobe pneumonia with COPD exacerbation - Taper IV steroids to PO, Antibiotics LEVOFLOX - Scheduled Nebulizers - Inhalers -Weaned off O2   * Chf - ruled out with normal echocardiogram Patient symptoms seem typical for heart failure. But no pulmonary  edema on chest x-ray or CT scan of the chest. Mild elevation in BNP. One dose of Lasix was given. Echocardiogram with normal ejection fraction .    * Dysphagia/failure to thrive Swallow eval-dysphagia 3 diet ordered Needs outpatient EGD if she could tolerate diet dysphagia 3, diet supplements Outpatient follow-up by primary care physician for further evaluation-60 pounds weight loss in 9 months  * CAD s/p CABG Continue home meds Coreg and statin  * Tobacco abuse Counseled to quit smoking for 6 minutes. Patient is agreeable we will provide nicotine patch  * Seizures On phenytoin  * Chronic pain Was on Vicodin in the past Restart  * DVT prophylaxis Lovenox  PT - no PT needs identified      DISCHARGE CONDITIONS:   stable  CONSULTS OBTAINED:     PROCEDURES  none  DRUG ALLERGIES:   Allergies  Allergen Reactions  . Motrin [Ibuprofen] Shortness Of Breath and Swelling    Swelling of lip/tongue  . Tramadol Hcl Nausea And Vomiting    DISCHARGE MEDICATIONS:   Current Discharge Medication List    START taking these medications   Details  benzonatate (TESSALON) 100 MG capsule Take 1 capsule (100 mg total) by mouth 3 (three) times daily as needed for cough. Qty: 20 capsule, Refills: 0    chlorpheniramine-HYDROcodone (TUSSIONEX) 10-8 MG/5ML SUER Take 5 mLs by mouth every 12 (twelve) hours. Qty: 115 mL, Refills: 0    feeding supplement, ENSURE ENLIVE, (ENSURE ENLIVE) LIQD  Take 237 mLs by mouth 2 (two) times daily between meals. Qty: 60 Bottle, Refills: 1    gabapentin (NEURONTIN) 100 MG capsule Take 1 capsule (100 mg total) by mouth 2 (two) times daily. Qty: 60 capsule, Refills: 0    levofloxacin (LEVAQUIN) 750 MG tablet Take 1 tablet (750 mg total) by mouth every other day. Qty: 3 tablet, Refills: 0    nicotine (NICODERM CQ - DOSED IN MG/24 HOURS) 14 mg/24hr patch Place 1 patch (14 mg total) onto the skin daily. Qty: 28 patch, Refills: 0     pantoprazole (PROTONIX) 40 MG tablet Take 1 tablet (40 mg total) by mouth daily. Qty: 30 tablet, Refills: 0    predniSONE (STERAPRED UNI-PAK 21 TAB) 10 MG (21) TBPK tablet Take 1 tablet (10 mg total) by mouth daily. Take 6 tablets by mouth for 1 day followed by  5 tablets by mouth for 1 day followed by  4 tablets by mouth for 1 day followed by  3 tablets by mouth for 1 day followed by  2 tablets by mouth for 1 day followed by  1 tablet by mouth for a day and stop Qty: 21 tablet, Refills: 0      CONTINUE these medications which have CHANGED   Details  sertraline (ZOLOFT) 100 MG tablet Take 1 tablet (100 mg total) by mouth daily. Qty: 30 tablet, Refills: 0      CONTINUE these medications which have NOT CHANGED   Details  atorvastatin (LIPITOR) 10 MG tablet Take 1 tablet (10 mg total) by mouth daily. Qty: 90 tablet, Refills: 1    carvedilol (COREG) 3.125 MG tablet Take 1 tablet (3.125 mg total) by mouth daily. Qty: 90 tablet, Refills: 1    LORazepam (ATIVAN) 0.5 MG tablet Take 1 tablet (0.5 mg total) by mouth 2 (two) times daily as needed for anxiety. Qty: 60 tablet, Refills: 0    phenytoin (DILANTIN) 100 MG ER capsule Take 2 capsules (200 mg total) by mouth at bedtime. Take 2 by mouth at bedtime Qty: 180 capsule, Refills: 1    QUEtiapine (SEROQUEL) 50 MG tablet Take 1 tablet (50 mg total) by mouth at bedtime. Qty: 30 tablet, Refills: 3    albuterol (PROVENTIL HFA;VENTOLIN HFA) 108 (90 Base) MCG/ACT inhaler Inhale 2 puffs into the lungs every 6 (six) hours as needed for wheezing or shortness of breath. Qty: 1 Inhaler, Refills: 1    albuterol (PROVENTIL) (2.5 MG/3ML) 0.083% nebulizer solution Take 3 mLs (2.5 mg total) by nebulization every 6 (six) hours as needed for Wheezing. Qty: 75 mL, Refills: 3    lidocaine (LIDODERM) 5 % Place 1 patch onto the skin daily. Remove & Discard patch within 12 hours or as directed by MD Qty: 180 patch, Refills: 4    mupirocin cream  (BACTROBAN) 2 % Apply 1 application topically 2 (two) times daily. Qty: 15 g, Refills: 0    neomycin-polymyxin b-dexamethasone (MAXITROL) 3.5-10000-0.1 SUSP     nitroGLYCERIN (NITROSTAT) 0.4 MG SL tablet Place 1 tablet (0.4 mg total) under the tongue every 5 (five) minutes as needed for chest pain. Qty: 20 tablet, Refills: 5    Tiotropium Bromide-Olodaterol (STIOLTO RESPIMAT) 2.5-2.5 MCG/ACT AERS Inhale 2.5 sprays into the lungs every morning. Qty: 4 g, Refills: 12    traMADol (ULTRAM) 50 MG tablet Take 1 tablet (50 mg total) by mouth 2 (two) times daily. Qty: 30 tablet, Refills: 0   Associated Diagnoses: Neck pain, bilateral; Arthritis  DISCHARGE INSTRUCTIONS:   Follow-up with primary care physician in a week Follow-up with gastroenterology in 2 weeks   DIET:  Cardiac diet, DYSPHAGIA -3   DISCHARGE CONDITION:  Fair  ACTIVITY:  Activity as tolerated  OXYGEN:  Home Oxygen: No.   Oxygen Delivery: room air  DISCHARGE LOCATION:  home   If you experience worsening of your admission symptoms, develop shortness of breath, life threatening emergency, suicidal or homicidal thoughts you must seek medical attention immediately by calling 911 or calling your MD immediately  if symptoms less severe.  You Must read complete instructions/literature along with all the possible adverse reactions/side effects for all the Medicines you take and that have been prescribed to you. Take any new Medicines after you have completely understood and accpet all the possible adverse reactions/side effects.   Please note  You were cared for by a hospitalist during your hospital stay. If you have any questions about your discharge medications or the care you received while you were in the hospital after you are discharged, you can call the unit and asked to speak with the hospitalist on call if the hospitalist that took care of you is not available. Once you are discharged, your primary care  physician will handle any further medical issues. Please note that NO REFILLS for any discharge medications will be authorized once you are discharged, as it is imperative that you return to your primary care physician (or establish a relationship with a primary care physician if you do not have one) for your aftercare needs so that they can reassess your need for medications and monitor your lab values.     Today  Chief Complaint  Patient presents with  . Shortness of Breath   Patient is feeling fine. Shortness of breath is better. Wants to go home. No PT needs identified  ROS:  CONSTITUTIONAL: Denies fevers, chills. Denies any fatigue, weakness.  EYES: Denies blurry vision, double vision, eye pain. EARS, NOSE, THROAT: Denies tinnitus, ear pain, hearing loss. RESPIRATORY: Denies cough, wheeze, shortness of breath.  CARDIOVASCULAR: Denies chest pain, palpitations, edema.  GASTROINTESTINAL: Denies nausea, vomiting, diarrhea, abdominal pain. Denies bright red blood per rectum. GENITOURINARY: Denies dysuria, hematuria. ENDOCRINE: Denies nocturia or thyroid problems. HEMATOLOGIC AND LYMPHATIC: Denies easy bruising or bleeding. SKIN: Denies rash or lesion. MUSCULOSKELETAL: Denies pain in neck, back, shoulder, knees, hips or arthritic symptoms.  NEUROLOGIC: Denies paralysis, paresthesias.  PSYCHIATRIC: Denies anxiety or depressive symptoms.   VITAL SIGNS:  Blood pressure (!) 120/47, pulse 68, temperature 97.5 F (36.4 C), temperature source Oral, resp. rate 16, height 4\' 11"  (1.499 m), weight 69.1 kg (152 lb 6.4 oz), SpO2 91 %.  I/O:    Intake/Output Summary (Last 24 hours) at 07/13/16 1132 Last data filed at 07/12/16 1819  Gross per 24 hour  Intake              240 ml  Output                0 ml  Net              240 ml    PHYSICAL EXAMINATION:  GENERAL:  83 y.o.-year-old patient lying in the bed with no acute distress.  EYES: Pupils equal, round, reactive to light and  accommodation. No scleral icterus. Extraocular muscles intact.  HEENT: Head atraumatic, normocephalic. Oropharynx and nasopharynx clear.  NECK:  Supple, no jugular venous distention. No thyroid enlargement, no tenderness.  LUNGS: Normal breath sounds bilaterally, no wheezing,  rales,rhonchi or crepitation. No use of accessory muscles of respiration.  CARDIOVASCULAR: S1, S2 normal. No murmurs, rubs, or gallops.  ABDOMEN: Soft, non-tender, non-distended. Bowel sounds present. No organomegaly or mass.  EXTREMITIES: No pedal edema, cyanosis, or clubbing.  NEUROLOGIC: Cranial nerves II through XII are intact. Muscle strength 5/5 in all extremities. Sensation intact. Gait not checked.  PSYCHIATRIC: The patient is alert and oriented x 3.  SKIN: No obvious rash, lesion, or ulcer.   DATA REVIEW:   CBC  Recent Labs Lab 07/12/16 0415  WBC 5.8  HGB 11.5*  HCT 33.7*  PLT 226    Chemistries   Recent Labs Lab 07/11/16 1106 07/12/16 0415  NA 141 141  K 4.2 3.7  CL 107 107  CO2 26 28  GLUCOSE 98 76  BUN 24* 22*  CREATININE 0.76 0.85  CALCIUM 9.2 8.6*  MG 2.0  --   AST 24  --   ALT 18  --   ALKPHOS 175*  --   BILITOT 0.6  --     Cardiac Enzymes  Recent Labs Lab 07/11/16 1106  TROPONINI <0.03    Microbiology Results  Results for orders placed or performed during the hospital encounter of 07/11/16  Blood culture (routine x 2)     Status: None (Preliminary result)   Collection Time: 07/11/16  1:51 PM  Result Value Ref Range Status   Specimen Description BLOOD   RIGHT AC  Final   Special Requests BOTTLES DRAWN AEROBIC AND ANAEROBIC  BCAV  Final   Culture NO GROWTH 2 DAYS  Final   Report Status PENDING  Incomplete  Blood culture (routine x 2)     Status: None (Preliminary result)   Collection Time: 07/11/16  1:56 PM  Result Value Ref Range Status   Specimen Description BLOOD  RIGHT AC  Final   Special Requests BOTTLES DRAWN AEROBIC AND ANAEROBIC  BCAV  Final   Culture NO  GROWTH 2 DAYS  Final   Report Status PENDING  Incomplete    RADIOLOGY:  Dg Chest 2 View  Result Date: 07/11/2016 CLINICAL DATA:  Shortness of breath and cough. EXAM: CHEST  2 VIEW COMPARISON:  02/05/2016 FINDINGS: Lungs appear hyperexpanded. Subsegmental atelectasis noted left base. No evidence for pulmonary edema or pleural effusion. The cardio pericardial silhouette is enlarged. Bones are diffusely demineralized. Telemetry leads overlie the chest. IMPRESSION: Left basilar atelectasis.  No acute cardiopulmonary findings. Electronically Signed   By: Kennith Center M.D.   On: 07/11/2016 11:39   Ct Angio Chest Pe W Or Wo Contrast  Addendum Date: 07/11/2016   ADDENDUM REPORT: 07/11/2016 14:33 ADDENDUM: There is a 2 mm nodular lesion in the right upper lobe. No follow-up needed if patient is low-risk. Non-contrast chest CT can be considered in 12 months if patient is high-risk. This recommendation follows the consensus statement: Guidelines for Management of Incidental Pulmonary Nodules Detected on CT Images: From the Fleischner Society 2017; Radiology 2017; 284:228-243. Electronically Signed   By: Bretta Bang III M.D.   On: 07/11/2016 14:33   Result Date: 07/11/2016 CLINICAL DATA:  Shortness of breath and lower extremity edema EXAM: CT ANGIOGRAPHY CHEST WITH CONTRAST TECHNIQUE: Multidetector CT imaging of the chest was performed using the standard protocol during bolus administration of intravenous contrast. Multiplanar CT image reconstructions and MIPs were obtained to evaluate the vascular anatomy. CONTRAST:  75 mL Isovue 370 nonionic COMPARISON:  Chest radiograph Jul 11, 2016 FINDINGS: Cardiovascular: There is no appreciable pulmonary embolus. There is  no thoracic aortic aneurysm or dissection. There is moderate calcification in the proximal left subclavian artery. There is moderate calcification in the right proximal common carotid artery. There are foci of atherosclerotic calcification in the  aorta. There are foci of coronary artery calcification at multiple sites. There is a small pericardial effusion. Mediastinum/Nodes: Thyroid appears unremarkable. There is no appreciable thoracic adenopathy. Lungs/Pleura: There is focal airspace consolidation in a portion of the posterior segment of the left upper lobe inferiorly. There is atelectatic change in both lung bases. On axial slice 50 series 6, there is a 2 mm nodular opacity in the posterior segment of the right upper lobe. There is no appreciable pleural effusion. Upper Abdomen: There is atherosclerotic calcification in the aorta. Visualized upper abdominal structures otherwise appear normal. Musculoskeletal: There is degenerative change in the thoracic spine. There is thoracic dextroscoliosis. There are no blastic or lytic bone lesions. There is moderate anterior wedging of the T5 vertebral body. Review of the MIP images confirms the above findings. IMPRESSION: No demonstrable pulmonary embolus. Focal area of airspace consolidation consistent with pneumonia in the inferior aspect of the posterior segment of the left upper lobe. There is bibasilar atelectasis. No appreciable adenopathy. Foci of atherosclerotic calcification including multiple foci of coronary artery calcification. Small pericardial effusion. Moderate wedging of the T5 vertebral body. Electronically Signed: By: Bretta Bang III M.D. On: 07/11/2016 13:46   US Venous Img Lower Unilateral Left  Result Date: 07/11/2016 CLINICAL DATA:  Left leg swelling.  Shortness of breath EXAM: LEFT LOWER EXTREMITY VENOUS DOPPLER ULTRASOUND TECHNIQUE: Gray-scale sonography with graded compression, as well as color Doppler and duplex ultrasound were performed to evaluate the lower extremity deep venous systems from the level of the common femoral vein and including the common femoral, femoral, profunda femoral, popliteal and calf veins including the posterior tibial, peroneal and gastrocnemius veins  when visible. The superficial great saphenous vein was also interrogated. Spectral Doppler was utilized to evaluate flow at rest and with distal augmentation maneuvers in the common femoral, femoral and popliteal veins. COMPARISON:  None. FINDINGS: Contralateral Common Femoral Vein: Respiratory phasicity is normal and symmetric with the symptomatic side. No evidence of thrombus. Normal compressibility. Common Femoral Vein: No evidence of thrombus. Saphenofemoral Junction: No evidence of thrombus. Profunda Femoral Vein: No evidence of thrombus. Femoral Vein: No evidence of thrombus. Popliteal Vein: No evidence of thrombus. Calf Veins: No evidence of thrombus. IMPRESSION: No evidence of DVT within the left lower extremity. Electronically Signed   By: Marnee Spring M.D.   On: 07/11/2016 13:07    EKG:   Orders placed or performed during the hospital encounter of 07/11/16  . EKG 12-Lead  . EKG 12-Lead  . ED EKG  . ED EKG      Management plans discussed with the patient, family and they are in agreement.  CODE STATUS:     Code Status Orders        Start     Ordered   07/11/16 1415  Full code  Continuous     07/11/16 1416    Code Status History    Date Active Date Inactive Code Status Order ID Comments User Context   This patient has a current code status but no historical code status.      TOTAL TIME TAKING CARE OF THIS PATIENT: 45 minutes.   Note: This dictation was prepared with Dragon dictation along with smaller phrase technology. Any transcriptional errors that result from this process are unintentional.   @  MEC@  on 07/13/2016 at 11:32 AM  Between 7am to 6pm - Pager - [phone]  After 6pm go to [email or web address] - password [email or web address] [facility]  [facility]  Office  [phone]  CC: Primary care physician; Galen Manila, NP

## 2016-07-13 NOTE — Progress Notes (Signed)
Pt's son arrived with her clothes, pt able to dress herself. D/c paperwork was reviewed with both pt's son and with pt, pt signed for receipt of paperwork. Pt was escorted to front entrance of facility via wheelchair to waiting family vehicle.

## 2016-07-13 NOTE — Progress Notes (Signed)
This writer attempted to call pt's son for pt's discharge, left message on son's voice mail.

## 2016-07-13 NOTE — Progress Notes (Signed)
Shift assessment completed. Pt is alert and oriented, sitting up in bed and eating candy corn during VS. Pt is on room air, lungs clear bilat, Hr is regular, abdomen is soft, bs heard. telebox in place. Pt is oob to bathroom to void, ppp, no edema noted. Pt had nicotine patch intact to rua, small round scabs noted to pt's bilat knees, behind R ear. Since assessment, pt has walked with physical therapy with reported steady gait, and pt is requesting to go home. piv #20 intact to lac, site is free of redness and swelling. Pt has dry cough but stated that this is chronic.

## 2016-07-13 NOTE — Plan of Care (Signed)
Problem: Pain Management: Goal: Expressions of feelings of enhanced comfort will increase Outcome: Completed/Met Date Met: 07/13/16 Pt has met goals for discharge.

## 2016-07-13 NOTE — Evaluation (Signed)
Physical Therapy Evaluation Patient Details Name: Sandy Daniel MRN: [id number] DOB: 1928/04/28 Today's Date: 07/13/2016   History of Present Illness  Pt is an 83 y.o. female presenting to hospital with 2 weeks of increasing SOB, cough, and LE swelling.  Pt admitted with L upper lobe PNA with COPD exacerbation.  PMH includes COPD, CAD, htn, MI, seizures, CABG, and h/o 60# weight loss in past 9 months.  Clinical Impression  Prior to hospital admission, pt was modified independent with ambulation using SPC vs rollator depending on how she was feeling.  Pt lives with family in 1 level home with steps to enter.  Currently pt is modified independent with bed mobility and progressed to SBA with transfers and ambulation around nursing loop using RW.  Pt reports using rollator at home and pt's balance and ambulation appear appropriate to continue to use rollator at home.  Pt's O2 sats 92% or greater on room air during therapy session.  Pt would benefit from skilled PT to address noted impairments and functional limitations during hospital stay (see below for any additional details) and to prevent deconditioning.  Upon hospital discharge, recommend pt discharge to home with support of family.  Pt appearing close to baseline in terms of functional mobility and no further PT needs anticipated upon hospital discharge.    Follow Up Recommendations No PT follow up    Equipment Recommendations   (pt has rollator at home already)    Recommendations for Other Services       Precautions / Restrictions Precautions Precautions: Fall Precaution Comments: Aspiration Restrictions Weight Bearing Restrictions: No      Mobility  Bed Mobility Overal bed mobility: Modified Independent             General bed mobility comments: Supine to sit with HOB elevated without any difficulties  Transfers Overall transfer level: Needs assistance Equipment used: Rolling walker (2 wheeled) Transfers: Sit to/from  [employer] Sit to Stand: Min guard;Supervision Stand pivot transfers: Min guard;Supervision       General transfer comment: initially CGA for safety but improved to SBA; steady without loss of balance with transfers  Ambulation/Gait Ambulation/Gait assistance: Min guard;Supervision Ambulation Distance (Feet): 190 Feet Assistive device: Rolling walker (2 wheeled)   Gait velocity: decreased   General Gait Details: antalgic; decreased stance time L LE (pt reports this is normal d/t chronic L knee pain); L knee mildly flexed during L LE stance phase  Stairs Stairs:  (Pt declined d/t h/o L LE "issues" and pt not wanting to aggravate them)          Wheelchair Mobility    Modified Rankin (Stroke Patients Only)       Balance Overall balance assessment: Needs assistance Sitting-balance support: No upper extremity supported;Feet supported Sitting balance-Leahy Scale: Normal Sitting balance - Comments: sitting edge of bed pt reached to floor to pick up item without any difficulties   Standing balance support: During functional activity;No upper extremity supported Standing balance-Leahy Scale: Good Standing balance comment: standing next to bed adjusting linen on bed without any loss of balance                             Pertinent Vitals/Pain Pain Assessment: 0-10 Pain Score: 8  Pain Location: chronic R knee pain Pain Descriptors / Indicators: Sore Pain Intervention(s): Limited activity within patient's tolerance;Monitored during session;Repositioned;Other (comment) (RN notified of pt's pain)  Vitals (HR and O2 on room air) stable  and WFL throughout treatment session.    Home Living Family/patient expects to be discharged to:: Private residence Living Arrangements: Children (Son and daughter in Social worker) Available Help at Discharge: Family Type of Home: House Home Access: Stairs to enter   Secretary/administrator of Steps: 5 with B railings (pt  normally uses L railing) Home Layout: One level Home Equipment: Walker - 4 wheels;Cane - single point;Shower seat - built in;Other (comment) (raised toilet seat)      Prior Function Level of Independence: Independent with assistive device(s)         Comments: Pt switches between [facility] vs rollator for ambulation depending on how she is feeling.  Pt denies any falls in past 6 months.     Hand Dominance        Extremity/Trunk Assessment   Upper Extremity Assessment Upper Extremity Assessment: Overall WFL for tasks assessed    Lower Extremity Assessment Lower Extremity Assessment: Overall WFL for tasks assessed       Communication   Communication: HOH  Cognition Arousal/Alertness: Awake/alert Behavior During Therapy: WFL for tasks assessed/performed Overall Cognitive Status: Within Functional Limits for tasks assessed                                        General Comments General comments (skin integrity, edema, etc.): Pt sleeping in bed upon PT arrival.  Nursing cleared pt for participation in physical therapy.  Pt agreeable to PT session.    Exercises     Assessment/Plan    PT Assessment Patient needs continued PT services  PT Problem List Decreased mobility;Pain       PT Treatment Interventions Gait training;Stair training;Functional mobility training;Therapeutic activities;Therapeutic exercise;Balance training;Patient/family education    PT Goals (Current goals can be found in the Care Plan section)  Acute Rehab PT Goals Patient Stated Goal: to go home PT Goal Formulation: With patient Time For Goal Achievement: 07/27/16 Potential to Achieve Goals: Good    Frequency Min 2X/week   Barriers to discharge        Co-evaluation               AM-PAC PT "6 Clicks" Daily Activity  Outcome Measure Difficulty turning over in bed (including adjusting bedclothes, sheets and blankets)?: None Difficulty moving from lying on back to sitting  on the side of the bed? : None Difficulty sitting down on and standing up from a chair with arms (e.g., wheelchair, bedside commode, etc,.)?: A Little Help needed moving to and from a bed to chair (including a wheelchair)?: A Little Help needed walking in hospital room?: A Little Help needed climbing 3-5 steps with a railing? : A Little 6 Click Score: 20    End of Session Equipment Utilized During Treatment: Gait belt Activity Tolerance: Patient tolerated treatment well Patient left: in chair;with call bell/phone within reach;with chair alarm set;with nursing/sitter in room Nurse Communication: Mobility status;Precautions (Pt's pain status) PT Visit Diagnosis: Difficulty in walking, not elsewhere classified (R26.2)    Time: 7829-5621 PT Time Calculation (min) (ACUTE ONLY): 14 min   Charges:   PT Evaluation $PT Eval Low Complexity: 1 Procedure     PT G CodesHendricks Limes, PT 07/13/16, 9:10 AM [phone]

## 2016-07-13 NOTE — Progress Notes (Signed)
Dr. Amado Coe has rounded on pt, pt is to be dc'd. This Clinical research associate removed prna from lac with catheter intact. Pt will call family member for ride home after lunch.

## 2016-07-13 NOTE — Discharge Instructions (Signed)
Follow-up with primary care physician in a week °Follow-up with gastroenterology in 2 weeks °

## 2016-07-13 NOTE — Progress Notes (Signed)
PHARMACIST - PHYSICIAN COMMUNICATION CONCERNING: Antibiotic IV to Oral Route Change Policy  RECOMMENDATION: This patient is receiving levofloxacin by the intravenous route.  Based on criteria approved by the Pharmacy and Therapeutics Committee, the antibiotic(s) is/are being converted to the equivalent oral dose form(s).   DESCRIPTION: These criteria include:  Patient being treated for a respiratory tract infection, urinary tract infection, cellulitis or clostridium difficile associated diarrhea if on metronidazole  The patient is not neutropenic and does not exhibit a GI malabsorption state  The patient is eating (either orally or via tube) and/or has been taking other orally administered medications for a least 24 hours  The patient is improving clinically and has a Tmax < 100.5  If you have questions about this conversion, please contact the Pharmacy Department  []  ( [phone] )  [place] [x]  ( [phone] )   [facility] []  ( [phone] )  [place] []  ( [phone] )  [facility] []  ( [phone] )  [facility]  

## 2016-07-14 ENCOUNTER — Emergency Department
Admission: EM | Admit: 2016-07-14 | Discharge: 2016-07-14 | Disposition: A | Discharge status: Home / Self Care | Payer: Medicare HMO | Attending: Emergency Medicine | Admitting: Emergency Medicine

## 2016-07-14 ENCOUNTER — Emergency Department: Payer: Medicare HMO

## 2016-07-14 ENCOUNTER — Encounter: Payer: Self-pay | Admitting: Emergency Medicine

## 2016-07-14 DIAGNOSIS — I10 Essential (primary) hypertension: Secondary | ICD-10-CM | POA: Diagnosis not present

## 2016-07-14 DIAGNOSIS — I2510 Atherosclerotic heart disease of native coronary artery without angina pectoris: Secondary | ICD-10-CM | POA: Insufficient documentation

## 2016-07-14 DIAGNOSIS — K5900 Constipation, unspecified: Secondary | ICD-10-CM | POA: Diagnosis present

## 2016-07-14 DIAGNOSIS — J449 Chronic obstructive pulmonary disease, unspecified: Secondary | ICD-10-CM | POA: Insufficient documentation

## 2016-07-14 DIAGNOSIS — F17210 Nicotine dependence, cigarettes, uncomplicated: Secondary | ICD-10-CM | POA: Insufficient documentation

## 2016-07-14 DIAGNOSIS — Z79899 Other long term (current) drug therapy: Secondary | ICD-10-CM | POA: Insufficient documentation

## 2016-07-14 DIAGNOSIS — I252 Old myocardial infarction: Secondary | ICD-10-CM | POA: Diagnosis not present

## 2016-07-14 DIAGNOSIS — K5901 Slow transit constipation: Secondary | ICD-10-CM | POA: Diagnosis not present

## 2016-07-14 DIAGNOSIS — J45909 Unspecified asthma, uncomplicated: Secondary | ICD-10-CM | POA: Diagnosis not present

## 2016-07-14 NOTE — ED Notes (Signed)
See triage note  States she has not had a bowel movement in about 4 days  Usually takes MOM with good results  Had some yesterday w/o relief  No n/v  And is soft

## 2016-07-14 NOTE — Discharge Instructions (Signed)
Continue previous medication follow-up with her treating doctor to discuss laxative use on a as needed basis.

## 2016-07-14 NOTE — ED Notes (Signed)
NAD noted at time of D/C. Pt taken to the lobby via wheelchair at this time. Pt denies any comments/concerns at this time.  

## 2016-07-14 NOTE — ED Provider Notes (Signed)
[facility] Emergency Department Provider Note   ____________________________________________   First MD Initiated Contact with Patient 07/14/16 1826     (approximate)  I have reviewed the triage vital signs and the nursing notes.   HISTORY  Chief Complaint Constipation    HPI Sandy Daniel is a 83 y.o. female patient complaining of constipation. Patient is very tearful stating that this is a problem that she's had for years. Patient state in the past which surgical 3 days about a bowel movements she was taking milk of magnesia which generally produces a bowel movement. Patient state her last bowel was 5 days ago. Patient denies abdominal or rectal pain. Denies nausea vomiting. No palliative measures for this complaint.   Past Medical History:  Diagnosis Date  . Arthritis   . Asthma   . B12 deficiency   . CAD (coronary artery disease)   . COPD (chronic obstructive pulmonary disease) (HCC)   . Depression   . Epilepsy (HCC)   . GERD (gastroesophageal reflux disease)   . Hyperlipidemia   . Hypertension   . Hypoxemia   . Macular degeneration (senile) of retina   . Macular degeneration of both eyes   . MI (myocardial infarction) (HCC) 09/2003, 05/2013  . Physiological tremor   . Presence of stent of CABG    x2   . Seizures [facility])     Patient Active Problem List   Diagnosis Date Noted  . COPD exacerbation (HCC) 07/11/2016  . Hallucinations 06/07/2016  . Self-mutilation 06/07/2016  . Controlled substance agreement signed 03/27/2016  . Dysphagia 03/26/2016  . Panic disorder 03/26/2016  . CAD (coronary artery disease)   . Seizure disorder (HCC)   . Macular degeneration of both eyes   . B12 deficiency   . COPD (chronic obstructive pulmonary disease) (HCC)   . HTN (hypertension)   . Hyperlipidemia   . Arthritis   . Depression   . GERD (gastroesophageal reflux disease)   . Nasal congestion 04/02/2015  . Increased weakness when ambulating  06/21/2014  . Physiological tremor 02/18/2014  . Chronic headache 12/08/2012    Past Surgical History:  Procedure Laterality Date  . ABDOMINAL HYSTERECTOMY    . APPENDECTOMY    . CARDIAC CATHETERIZATION  05/2013  . CARDIAC SURGERY    . CATARACT EXTRACTION  03/2016  . CHOLECYSTECTOMY  1962  . CORONARY ANGIOPLASTY WITH STENT PLACEMENT  Aug. 2005 and March 2015  . ROTATOR CUFF REPAIR Right   . VENTRAL HERNIA REPAIR  2002    Prior to Admission medications   Medication Sig Start Date End Date Taking? Authorizing Provider  albuterol (PROVENTIL HFA;VENTOLIN HFA) 108 (90 Base) MCG/ACT inhaler Inhale 2 puffs into the lungs every 6 (six) hours as needed for wheezing or shortness of breath. 04/03/16   Steele Sizer, MD  albuterol (PROVENTIL) (2.5 MG/3ML) 0.083% nebulizer solution Take 3 mLs (2.5 mg total) by nebulization every 6 (six) hours as needed for Wheezing. 03/26/16   Johnson, Megan P, DO  atorvastatin (LIPITOR) 10 MG tablet Take 1 tablet (10 mg total) by mouth daily. 05/29/16   Johnson, Megan P, DO  benzonatate (TESSALON) 100 MG capsule Take 1 capsule (100 mg total) by mouth 3 (three) times daily as needed for cough. 07/13/16   Ramonita Lab, MD  carvedilol (COREG) 3.125 MG tablet Take 1 tablet (3.125 mg total) by mouth daily. 03/26/16   Johnson, Megan P, DO  chlorpheniramine-HYDROcodone (TUSSIONEX) 10-8 MG/5ML SUER Take 5 mLs by mouth every 12 (  twelve) hours. 07/13/16   Gouru, Deanna Artis, MD  feeding supplement, ENSURE ENLIVE, (ENSURE ENLIVE) LIQD Take 237 mLs by mouth 2 (two) times daily between meals. 07/13/16   Ramonita Lab, MD  gabapentin (NEURONTIN) 100 MG capsule Take 1 capsule (100 mg total) by mouth 2 (two) times daily. 07/13/16   Ramonita Lab, MD  levofloxacin (LEVAQUIN) 750 MG tablet Take 1 tablet (750 mg total) by mouth every other day. 07/13/16   Gouru, Deanna Artis, MD  lidocaine (LIDODERM) 5 % Place 1 patch onto the skin daily. Remove & Discard patch within 12 hours or as directed by MD 05/29/16    Olevia Perches P, DO  LORazepam (ATIVAN) 0.5 MG tablet Take 1 tablet (0.5 mg total) by mouth 2 (two) times daily as needed for anxiety. 05/29/16   Johnson, Megan P, DO  mupirocin cream (BACTROBAN) 2 % Apply 1 application topically 2 (two) times daily. 05/29/16   Olevia Perches P, DO  neomycin-polymyxin b-dexamethasone (MAXITROL) 3.5-10000-0.1 SUSP  06/14/16   [provider]  nicotine (NICODERM CQ - DOSED IN MG/24 HOURS) 14 mg/24hr patch Place 1 patch (14 mg total) onto the skin daily. 07/14/16   Ramonita Lab, MD  nitroGLYCERIN (NITROSTAT) 0.4 MG SL tablet Place 1 tablet (0.4 mg total) under the tongue every 5 (five) minutes as needed for chest pain. 03/26/16   Johnson, Megan P, DO  pantoprazole (PROTONIX) 40 MG tablet Take 1 tablet (40 mg total) by mouth daily. 07/14/16   Ramonita Lab, MD  phenytoin (DILANTIN) 100 MG ER capsule Take 2 capsules (200 mg total) by mouth at bedtime. Take 2 by mouth at bedtime 03/26/16   Olevia Perches P, DO  predniSONE (STERAPRED UNI-PAK 21 TAB) 10 MG (21) TBPK tablet Take 1 tablet (10 mg total) by mouth daily. Take 6 tablets by mouth for 1 day followed by  5 tablets by mouth for 1 day followed by  4 tablets by mouth for 1 day followed by  3 tablets by mouth for 1 day followed by  2 tablets by mouth for 1 day followed by  1 tablet by mouth for a day and stop 07/13/16   Gouru, Deanna Artis, MD  QUEtiapine (SEROQUEL) 50 MG tablet Take 1 tablet (50 mg total) by mouth at bedtime. Patient taking differently: Take 25 mg by mouth at bedtime.  06/20/16   Galen Manila, NP  sertraline (ZOLOFT) 100 MG tablet Take 1 tablet (100 mg total) by mouth daily. 07/13/16   Ramonita Lab, MD  Tiotropium Bromide-Olodaterol (STIOLTO RESPIMAT) 2.5-2.5 MCG/ACT AERS Inhale 2.5 sprays into the lungs every morning. Patient not taking: Reported on 07/11/2016 03/26/16   Olevia Perches P, DO  traMADol (ULTRAM) 50 MG tablet Take 1 tablet (50 mg total) by mouth 2 (two) times daily. Patient not taking:  Reported on 07/11/2016 06/18/16   Galen Manila, NP    Allergies Motrin [ibuprofen] and Tramadol hcl  Family History  Problem Relation Age of Onset  . Diabetes Mother   . Cancer Sister   . Diabetes Sister   . Heart disease Sister   . Heart attack Sister   . Hyperlipidemia Sister   . Hypertension Sister   . Cancer Brother   . Diabetes Brother   . Heart disease Brother   . Heart attack Brother   . Hyperlipidemia Brother   . Hypertension Brother   . Diabetes Brother   . Diabetes Brother   . Kidney failure Brother     Social History Social History  Substance  Use Topics  . Smoking status: Current Every Day Smoker    Packs/day: 1.00    Years: 50.00    Types: Cigarettes  . Smokeless tobacco: Never Used  . Alcohol use No    Review of Systems  Constitutional: No fever/chills Eyes: No visual changes. ENT: No sore throat. Cardiovascular: Denies chest pain. Respiratory: Denies shortness of breath. Gastrointestinal: No abdominal pain.  No nausea, no vomiting.  No diarrhea. constipation Genitourinary: Negative for dysuria. Musculoskeletal: Negative for back pain. Skin: Negative for rash. Neurological: Negative for headaches, focal weakness or numbness. Psychiatric:seizure disorder, anxiety,and depression. Endocrine:hypertension and hyperlipidemia Hematological/Lymphatic: Allergic/Immunilogical:ibuprofen and tramadol____________________________________________   PHYSICAL EXAM:  VITAL SIGNS: ED Triage Vitals  Enc Vitals Group     BP 07/14/16 1708 (!) 124/48     Pulse Rate 07/14/16 1708 66     Resp 07/14/16 1708 18     Temp 07/14/16 1708 98.6 F (37 C)     Temp src --      SpO2 07/14/16 1708 91 %     Weight 07/14/16 1714 160 lb (72.6 kg)     Height 07/14/16 1714 4\' 11"  (1.499 m)     Head Circumference --      Peak Flow --      Pain Score 07/14/16 1714 0     Pain Loc --      Pain Edu? --      Excl. in GC? --     Constitutional: Alert and oriented.  Moderate distress Cardiovascular: Normal rate, regular rhythm. Grossly normal heart sounds.  Good peripheral circulation. Respiratory: Normal respiratory effort.  No retractions. Lungs CTAB. Gastrointestinal: Soft and nontender. Mild distention. Decreased bowel sounds. No CVA tenderness. Skin:  Skin is warm, dry and intact. No rash noted. Psychiatric: Mood and affect are normal. Speech and behavior are normal.  ____________________________________________   LABS (all labs ordered are listed, but only abnormal results are displayed)  Labs Reviewed - No data to display ____________________________________________  EKG   ____________________________________________  RADIOLOGY  X-ray consistent with constipation with fecal impaction. ____________________________________________   PROCEDURES  Procedure(s) performed: None  Procedures  Critical Care performed: No  ____________________________________________   INITIAL IMPRESSION / ASSESSMENT AND PLAN / ED COURSE  Pertinent labs & imaging results that were available during my care of the patient were reviewed by me and considered in my medical decision making (see chart for details).  Constipation with fecal impaction. Patient impaction was digitally removed. Patient produced a full bowel movement and states she is feeling better. Patient advised to continue previous medication and follow-up with PCP as needed.      ____________________________________________   FINAL CLINICAL IMPRESSION(S) / ED DIAGNOSES  Final diagnoses:  Constipation by delayed colonic transit      NEW MEDICATIONS STARTED DURING THIS VISIT:  New Prescriptions   No medications on file     Note:  This document was prepared using Dragon voice recognition software and may include unintentional dictation errors.    Joni Reining, PA-C 07/14/16 2048    Merrily Brittle, MD 07/14/16 2109

## 2016-07-14 NOTE — ED Triage Notes (Signed)
Patient presents to the ED with constipation.  Patient is tearful.  She states that for the past several years, if she goes 3 days without a bowel movement she will take milk of magnesia, which will generally help.  Patient states that she was recently in the hospital and they refused to give her milk of magnesia until yesterday.  Patient states, "I think they watered it down too."  Patient states she was going to take it again today but her son told her it could mess up her medications.  Patient states her last BM was 5 days ago.  Patient denies any abdominal or rectal pain.  Denies vomiting.  Patient was in the hospital for pneumonia.  Patient states her breathing is much better since she was in the hospital.  Patient speaking in full sentences without difficulty.

## 2016-07-16 ENCOUNTER — Other Ambulatory Visit: Payer: Self-pay | Admitting: Family Medicine

## 2016-07-16 ENCOUNTER — Encounter: Payer: Self-pay | Admitting: Nurse Practitioner

## 2016-07-16 ENCOUNTER — Ambulatory Visit (INDEPENDENT_AMBULATORY_CARE_PROVIDER_SITE_OTHER): Payer: Medicare HMO | Admitting: Nurse Practitioner

## 2016-07-16 VITALS — BP 129/56 | HR 65 | Temp 98.2°F | Ht 60.0 in | Wt 159.4 lb

## 2016-07-16 DIAGNOSIS — F419 Anxiety disorder, unspecified: Secondary | ICD-10-CM | POA: Insufficient documentation

## 2016-07-16 DIAGNOSIS — R2681 Unsteadiness on feet: Secondary | ICD-10-CM | POA: Insufficient documentation

## 2016-07-16 DIAGNOSIS — M545 Low back pain: Secondary | ICD-10-CM

## 2016-07-16 DIAGNOSIS — G8929 Other chronic pain: Secondary | ICD-10-CM

## 2016-07-16 DIAGNOSIS — J449 Chronic obstructive pulmonary disease, unspecified: Secondary | ICD-10-CM | POA: Diagnosis not present

## 2016-07-16 LAB — CULTURE, BLOOD (ROUTINE X 2)
CULTURE: NO GROWTH
CULTURE: NO GROWTH

## 2016-07-16 MED ORDER — GABAPENTIN 100 MG PO CAPS: 200.0000 mg | ORAL_CAPSULE | Freq: Two times a day (BID) | ORAL | 0 refills | Status: DC

## 2016-07-16 MED ORDER — LORAZEPAM 0.5 MG PO TABS: 0.5000 mg | ORAL_TABLET | Freq: Two times a day (BID) | ORAL | 2 refills | Status: DC | PRN

## 2016-07-16 MED ORDER — FLUTICASONE-SALMETEROL 100-50 MCG/DOSE IN AEPB: 1.0000 | INHALATION_SPRAY | Freq: Two times a day (BID) | RESPIRATORY_TRACT | 3 refills | Status: DC

## 2016-07-16 MED ORDER — ASPIRIN 81 MG PO TABS: 81.0000 mg | ORAL_TABLET | Freq: Every day | ORAL | 11 refills | Status: DC

## 2016-07-16 NOTE — Assessment & Plan Note (Signed)
Stable.  Clear lungs today.  Pt and daughter w/ poor medication knowledge.  Using albuterol neb and inhaler q am. Albuterol neb in pm w/o ICS/LABA medication at home r/t cost.  Plan: 1. Changed ICS/LABA to Advair.  Use one puff once daily in am.  If cost is a problem, find out which ICS/LABA is covered best by Medicare/[employer]. Pt/daughter instructed on how to do this. 2. Continue albuterol neb bid.  Reviewed use of albuterol inhaler as rescue only. 3. Monitor symptoms and Lower extremity swelling.

## 2016-07-16 NOTE — Assessment & Plan Note (Addendum)
Pt improving despite no oxycodone.  Tramadol is not tolerated by pt r/t allergy.  On gabapentin 100 mg bid.  Plan: 1. Increase gabapentin to 200 mg bid. 2. PT w/ home health.  Evaluate and treat. Specific focus on gait.  May also work toward decreased pain. 3. Specifically defined no oxycodone for chronic use from this clinic.  Pt does not want to go to pain clinic.  Opioids refused.

## 2016-07-16 NOTE — Assessment & Plan Note (Signed)
Pt using cane.  Previously had used walker in clinic.  Needs cane and person/wall for maintaining balance.  Plan: 1. Home health referral

## 2016-07-16 NOTE — Progress Notes (Signed)
I have reviewed this encounter including the documentation in this note and/or discussed this patient with the provider, [name], AGPCNP-BC. I am certifying that I agree with the content of this note as supervising physician.  Saralyn Pilar, DO [facility] 07/16/2016, 4:53 PM

## 2016-07-16 NOTE — Progress Notes (Addendum)
Subjective:    Patient ID: Sandy Daniel, female    DOB: 1928-03-17, 83 y.o.   MRN: [id number]  Sandy Daniel is a 83 y.o. female presenting on 07/16/2016 for Hospitalization Follow-up (COPD, pneumonia, and constipation )   HPI Hospital follow-up: COPD, pneumonia Admission date: 07/11/2016 Discharge date: 07/13/2016 Admission dx: LUL pna x/COPD exacerbation CHF as cause of sx was excluded by normal echocardiogram.  Feeling better.  Has persistent shortness of breath worse with exertion and takes about 1 hour to recover.  States she is short of breath now - no increased work of breathing.  Denies orthopnea.  Pt and daughter state she had diuresis during hospitalization.  They state pt has new CHF despite chart review indicates none. Chart review admission weight to discharge weight: - 8 lbs  Current COPD meds:  -Takes "1 inhaler and 1 neb. in am"  Takes "1 neb at night"  She states she is using albuterol once per day.  Of note in February, Dr. Laural Benes prescribed ICS/LABA Stiolto.  Not covered and pt not taking. - Finishes her prednisone taper in 2 days. - Has completed levofloxacin.  Back pain Shooting pain across top of hips. Not taking oxycodone since last visit.  Overall pain is improving since last visit.  Pt declines going to pain clinic.  Taking gabapentin 100 mg bid with some relief.   Anxiety Lorazepam - would like this refilled.  Pt has been out of lorazepam for a couple weeks.  Pt states she doesn't notice much difference off the lorazepam.  Daughter notices more shakiness, jaw tremor, motivation without lorazepam.  Only taking 25 mg Seroquel per daughter's report despite 50 mg tablet ordered.  Depression screen [facility] 2/9 07/16/2016 05/29/2016 03/26/2016  Decreased Interest 2 2 2   Down, Depressed, Hopeless 2 1 3   PHQ - 2 Score 4 3 5   Altered sleeping 3 - 3  Tired, decreased energy 2 - 3  Change in appetite 2 - 3  Feeling bad or failure about yourself  0 - 3  Trouble  concentrating 3 - 3  Moving slowly or fidgety/restless 2 - 2  Suicidal thoughts 0 - 0  PHQ-9 Score 16 - 22  Difficult doing work/chores - - Not difficult at all   GAD 7 : Generalized Anxiety Score 07/16/2016  Nervous, Anxious, on Edge 3  Control/stop worrying 3  Worry too much - different things 3  Trouble relaxing 3  Restless 3  Easily annoyed or irritable 3  Afraid - awful might happen 3  Total GAD 7 Score 21  Anxiety Difficulty Very difficult    Social History  Substance Use Topics  . Smoking status: Current Every Day Smoker    Packs/day: 1.00    Years: 50.00    Types: Cigarettes  . Smokeless tobacco: Never Used  . Alcohol use No    Review of Systems Per HPI unless specifically indicated above     Objective:    BP (!) 129/56   Pulse 65   Temp 98.2 F (36.8 C) (Oral)   Ht 5' (1.524 m)   Wt 159 lb 6.4 oz (72.3 kg)   BMI 31.13 kg/m    Wt Readings from Last 3 Encounters:  07/16/16 159 lb 6.4 oz (72.3 kg)  07/14/16 160 lb (72.6 kg)  07/12/16 152 lb 6.4 oz (69.1 kg)    Physical Exam  Constitutional: She is oriented to person, place, and time. She appears well-developed and well-nourished. No distress.  HENT:  Head: Normocephalic  and atraumatic.  Mouth/Throat: Oropharynx is clear and moist.  Cardiovascular: Normal rate, regular rhythm, normal heart sounds and intact distal pulses.   LLE +1 pitting pedal edema RLE trace sock-line edema  Pulmonary/Chest: Effort normal. No respiratory distress.  Tubular breath sounds throughout  Abdominal: Soft. Bowel sounds are normal. She exhibits no distension.  Musculoskeletal:  Antalgic gait.  Poor balance, coordination noted.  Pt walking w/ cane and using wall/person for balance.  Neurological: She is alert and oriented to person, place, and time.  Skin: Skin is warm and dry.  Psychiatric: She has a normal mood and affect. Her behavior is normal. Judgment and thought content normal.  Vitals reviewed.  Results for orders  placed or performed during the hospital encounter of 07/11/16  Blood culture (routine x 2)  Result Value Ref Range   Specimen Description BLOOD   RIGHT AC    Special Requests BOTTLES DRAWN AEROBIC AND ANAEROBIC  BCAV    Culture NO GROWTH 5 DAYS    Report Status 07/16/2016 FINAL   Blood culture (routine x 2)  Result Value Ref Range   Specimen Description BLOOD  RIGHT AC    Special Requests BOTTLES DRAWN AEROBIC AND ANAEROBIC  BCAV    Culture NO GROWTH 5 DAYS    Report Status 07/16/2016 FINAL   CBC with Differential  Result Value Ref Range   WBC 5.6 3.6 - 11.0 K/uL   RBC 4.26 3.80 - 5.20 MIL/uL   Hemoglobin 12.7 12.0 - 16.0 g/dL   HCT 47.3 40.3 - 70.9 %   MCV 89.4 80.0 - 100.0 fL   MCH 29.7 26.0 - 34.0 pg   MCHC 33.2 32.0 - 36.0 g/dL   RDW 64.3 (H) 83.8 - 18.4 %   Platelets 270 150 - 440 K/uL   Neutrophils Relative % 63 %   Neutro Abs 3.6 1.4 - 6.5 K/uL   Lymphocytes Relative 24 %   Lymphs Abs 1.4 1.0 - 3.6 K/uL   Monocytes Relative 11 %   Monocytes Absolute 0.6 0.2 - 0.9 K/uL   Eosinophils Relative 1 %   Eosinophils Absolute 0.1 0 - 0.7 K/uL   Basophils Relative 1 %   Basophils Absolute 0.0 0 - 0.1 K/uL  Comprehensive metabolic panel  Result Value Ref Range   Sodium 141 135 - 145 mmol/L   Potassium 4.2 3.5 - 5.1 mmol/L   Chloride 107 101 - 111 mmol/L   CO2 26 22 - 32 mmol/L   Glucose, Bld 98 65 - 99 mg/dL   BUN 24 (H) 6 - 20 mg/dL   Creatinine, Ser 0.37 0.44 - 1.00 mg/dL   Calcium 9.2 8.9 - 54.3 mg/dL   Total Protein 7.8 6.5 - 8.1 g/dL   Albumin 3.7 3.5 - 5.0 g/dL   AST 24 15 - 41 U/L   ALT 18 14 - 54 U/L   Alkaline Phosphatase 175 (H) 38 - 126 U/L   Total Bilirubin 0.6 0.3 - 1.2 mg/dL   GFR calc non Af Amer >60 >60 mL/min   GFR calc Af Amer >60 >60 mL/min   Anion gap 8 5 - 15  Troponin I  Result Value Ref Range   Troponin I <0.03 <0.03 ng/mL  Brain natriuretic peptide  Result Value Ref Range   B Natriuretic Peptide 105.0 (H) 0.0 - 100.0 pg/mL  Magnesium    Result Value Ref Range   Magnesium 2.0 1.7 - 2.4 mg/dL  Fibrin derivatives D-Dimer ([facility] only)  Result Value Ref  Range   Fibrin derivatives D-dimer ([facility]) 2,359.92 (H) 0.00 - 499.00  Basic metabolic panel  Result Value Ref Range   Sodium 141 135 - 145 mmol/L   Potassium 3.7 3.5 - 5.1 mmol/L   Chloride 107 101 - 111 mmol/L   CO2 28 22 - 32 mmol/L   Glucose, Bld 76 65 - 99 mg/dL   BUN 22 (H) 6 - 20 mg/dL   Creatinine, Ser 1.61 0.44 - 1.00 mg/dL   Calcium 8.6 (L) 8.9 - 10.3 mg/dL   GFR calc non Af Amer >60 >60 mL/min   GFR calc Af Amer >60 >60 mL/min   Anion gap 6 5 - 15  CBC  Result Value Ref Range   WBC 5.8 3.6 - 11.0 K/uL   RBC 3.73 (L) 3.80 - 5.20 MIL/uL   Hemoglobin 11.5 (L) 12.0 - 16.0 g/dL   HCT 09.6 (L) 04.5 - 40.9 %   MCV 90.3 80.0 - 100.0 fL   MCH 30.9 26.0 - 34.0 pg   MCHC 34.2 32.0 - 36.0 g/dL   RDW 81.1 (H) 91.4 - 78.2 %   Platelets 226 150 - 440 K/uL  ECHOCARDIOGRAM COMPLETE  Result Value Ref Range   Weight 2,560 oz   Height 59 in   BP 153/69 mmHg      Assessment & Plan:   Problem List Items Addressed This Visit      Respiratory   COPD (chronic obstructive pulmonary disease) (HCC) - Primary    Stable.  Clear lungs today.  Pt and daughter w/ poor medication knowledge.  Using albuterol neb and inhaler q am. Albuterol neb in pm w/o ICS/LABA medication at home r/t cost.  Plan: 1. Changed ICS/LABA to Advair.  Use one puff once daily in am.  If cost is a problem, find out which ICS/LABA is covered best by Medicare/[employer]. Pt/daughter instructed on how to do this. 2. Continue albuterol neb bid.  Reviewed use of albuterol inhaler as rescue only. 3. Monitor symptoms and Lower extremity swelling.      Relevant Medications   Fluticasone-Salmeterol (ADVAIR) 100-50 MCG/DOSE AEPB   Other Relevant Orders   AMB Referral to [facility] Care Management   Ambulatory referral to Home Health     Other   Anxiety    GAD 7 today = 21 very difficult.  Worsening off lorazepam.  Pt  has been out for 2 weeks.    Plan: 1. Resume lorazepam 0.5 mg bid prn. 2. Continue sertraline 200 mg once daily 3. Continue seroquel hs - Ensure dose received is 50 mg once at bedtime.      Relevant Medications   LORazepam (ATIVAN) 0.5 MG tablet   Chronic bilateral low back pain    Pt improving despite no oxycodone.  Tramadol is not tolerated by pt r/t allergy.  On gabapentin 100 mg bid.  Plan: 1. Increase gabapentin to 200 mg bid. 2. PT w/ home health.  Evaluate and treat. Specific focus on gait.  May also work toward decreased pain. 3. Specifically defined no oxycodone for chronic use from this clinic.  Pt does not want to go to pain clinic.  Opioids refused.      Relevant Medications   aspirin 81 MG tablet   Unstable gait    Pt using cane.  Previously had used walker in clinic.  Needs cane and person/wall for maintaining balance.  Plan: 1. Home health referral      Relevant Orders   Ambulatory referral to Home Health  Meds ordered this encounter  Medications  . DISCONTD: erythromycin ophthalmic ointment  . DISCONTD: predniSONE (DELTASONE) 10 MG tablet  . Fluticasone-Salmeterol (ADVAIR) 100-50 MCG/DOSE AEPB    Sig: Inhale 1 puff into the lungs 2 (two) times daily.    Dispense:  1 each    Refill:  3    Order Specific Question:   Supervising Provider    Answer:   Smitty Cords [[id number]]  . gabapentin (NEURONTIN) 100 MG capsule    Sig: Take 2 capsules (200 mg total) by mouth 2 (two) times daily.    Dispense:  120 capsule    Refill:  0    Order Specific Question:   Supervising Provider    Answer:   Smitty Cords [[id number]]  . LORazepam (ATIVAN) 0.5 MG tablet    Sig: Take 1 tablet (0.5 mg total) by mouth 2 (two) times daily as needed for anxiety.    Dispense:  60 tablet    Refill:  2    Order Specific Question:   Supervising Provider    Answer:   Smitty Cords [[id number]]  . aspirin 81 MG tablet    Sig: Take 1 tablet (81 mg total) by mouth  daily.    Dispense:  30 tablet    Refill:  11    Order Specific Question:   Supervising Provider    Answer:   Smitty Cords [[id number]]      Follow up plan: Return in about 4 weeks (around 08/13/2016) for COPD, CHF follow up.  A total of 45 minutes was spent face-to-face with this patient. Greater than 50% of this time was spent in counseling and coordination of care with the patient.   [name], DNP, AGPCNP-BC Adult Gerontology Primary Care Nurse Practitioner [facility] 07/16/2016, 1:24 PM

## 2016-07-16 NOTE — Patient Instructions (Addendum)
Sandy Daniel, Thank you for coming in to clinic today.  1. For your COPD: - START taking advair once per day in am. - CONTINUE nebulizer bid - ONLY TAKE albuterol inhaler if short of breath and not recovering.  2. FOR you CHF: - Weigh yourself daily. If you have weight gain of 2 pounds in 1 day OR 5 pounds in 1 week, you need to call me.  This can mean you are holding fluid.  - Home Health Care is going to start coming to see you.    3. FOr your nerves - CONTINUE taking sertaline 100 mg tablet  - TAKE 2 tablets once daily - CONTINUE taking seroquel 50 mg tablet - TAKE 1 whole tablet at bedtime. - RESTART your lorazepam 1 tablet twice per day  4. FOR your back pain: - Take gabapentin 100 mg tablet. - TAKE 2 tablets twice per day. - IF you need oxycodone, you will need to go to a pain clinic.  Call me you decide you want to go.    Please schedule a follow-up appointment with [name], AGNP to Return in about 4 weeks (around 08/13/2016) for COPD, CHF follow up.  If you have any other questions or concerns, please feel free to call the clinic or send a message through MyChart. You may also schedule an earlier appointment if necessary.  [name], DNP, AGNP-BC Adult Gerontology Nurse Practitioner [facility]   Chronic Obstructive Pulmonary Disease Chronic obstructive pulmonary disease (COPD) is a common lung condition in which airflow from the lungs is limited. COPD is a general term that can be used to describe many different lung problems that limit airflow, including both chronic bronchitis and emphysema. If you have COPD, your lung function will probably never return to normal, but there are measures you can take to improve lung function and make yourself feel better. What are the causes?  Smoking (common).  Exposure to secondhand smoke.  Genetic problems.  Chronic inflammatory lung diseases or recurrent infections. What are the signs or  symptoms?  Shortness of breath, especially with physical activity.  Deep, persistent (chronic) cough with a large amount of thick mucus.  Wheezing.  Rapid breaths (tachypnea).  Gray or bluish discoloration (cyanosis) of the skin, especially in your fingers, toes, or lips.  Fatigue.  Weight loss.  Frequent infections or episodes when breathing symptoms become much worse (exacerbations).  Chest tightness. How is this diagnosed? Your health care provider will take a medical history and perform a physical examination to diagnose COPD. Additional tests for COPD may include:  Lung (pulmonary) function tests.  Chest X-ray.  CT scan.  Blood tests.  How is this treated? Treatment for COPD may include:  Inhaler and nebulizer medicines. These help manage the symptoms of COPD and make your breathing more comfortable.  Supplemental oxygen. Supplemental oxygen is only helpful if you have a low oxygen level in your blood.  Exercise and physical activity. These are beneficial for nearly all people with COPD.  Lung surgery or transplant.  Nutrition therapy to gain weight, if you are underweight.  Pulmonary rehabilitation. This may involve working with a team of health care providers and specialists, such as respiratory, occupational, and physical therapists.  Follow these instructions at home:  Take all medicines (inhaled or pills) as directed by your health care provider.  Avoid over-the-counter medicines or cough syrups that dry up your airway (such as antihistamines) and slow down the elimination of secretions unless instructed otherwise  by your health care provider.  If you are a smoker, the most important thing that you can do is stop smoking. Continuing to smoke will cause further lung damage and breathing trouble. Ask your health care provider for help with quitting smoking. He or she can direct you to community resources or hospitals that provide support.  Avoid exposure  to irritants such as smoke, chemicals, and fumes that aggravate your breathing.  Use oxygen therapy and pulmonary rehabilitation if directed by your health care provider. If you require home oxygen therapy, ask your health care provider whether you should purchase a pulse oximeter to measure your oxygen level at home.  Avoid contact with individuals who have a contagious illness.  Avoid extreme temperature and humidity changes.  Eat healthy foods. Eating smaller, more frequent meals and resting before meals may help you maintain your strength.  Stay active, but balance activity with periods of rest. Exercise and physical activity will help you maintain your ability to do things you want to do.  Preventing infection and hospitalization is very important when you have COPD. Make sure to receive all the vaccines your health care provider recommends, especially the pneumococcal and influenza vaccines. Ask your health care provider whether you need a pneumonia vaccine.  Learn and use relaxation techniques to manage stress.  Learn and use controlled breathing techniques as directed by your health care provider. Controlled breathing techniques include: 1. Pursed lip breathing. Start by breathing in (inhaling) through your nose for 1 second. Then, purse your lips as if you were going to whistle and breathe out (exhale) through the pursed lips for 2 seconds. 2. Diaphragmatic breathing. Start by putting one hand on your abdomen just above your waist. Inhale slowly through your nose. The hand on your abdomen should move out. Then purse your lips and exhale slowly. You should be able to feel the hand on your abdomen moving in as you exhale.  Learn and use controlled coughing to clear mucus from your lungs. Controlled coughing is a series of short, progressive coughs. The steps of controlled coughing are: 1. Lean your head slightly forward. 2. Breathe in deeply using diaphragmatic breathing. 3. Try to hold  your breath for 3 seconds. 4. Keep your mouth slightly open while coughing twice. 5. Spit any mucus out into a tissue. 6. Rest and repeat the steps once or twice as needed. Contact a health care provider if:  You are coughing up more mucus than usual.  There is a change in the color or thickness of your mucus.  Your breathing is more labored than usual.  Your breathing is faster than usual. Get help right away if:  You have shortness of breath while you are resting.  You have shortness of breath that prevents you from: ? Being able to talk. ? Performing your usual physical activities.  You have chest pain lasting longer than 5 minutes.  Your skin color is more cyanotic than usual.  You measure low oxygen saturations for longer than 5 minutes with a pulse oximeter. This information is not intended to replace advice given to you by your health care provider. Make sure you discuss any questions you have with your health care provider. Document Released: 11/08/2004 Document Revised: 07/07/2015 Document Reviewed: 09/25/2012 Elsevier Interactive Patient Education  2017 [employer].

## 2016-07-16 NOTE — Assessment & Plan Note (Signed)
GAD 7 today = 21 very difficult.  Worsening off lorazepam.  Pt has been out for 2 weeks.    Plan: 1. Resume lorazepam 0.5 mg bid prn. 2. Continue sertraline 200 mg once daily 3. Continue seroquel hs - Ensure dose received is 50 mg once at bedtime.

## 2016-07-17 ENCOUNTER — Other Ambulatory Visit: Payer: Self-pay

## 2016-07-19 ENCOUNTER — Telehealth: Payer: Self-pay | Admitting: Nurse Practitioner

## 2016-07-19 DIAGNOSIS — G8929 Other chronic pain: Secondary | ICD-10-CM

## 2016-07-19 DIAGNOSIS — M545 Low back pain: Secondary | ICD-10-CM

## 2016-07-19 DIAGNOSIS — F17290 Nicotine dependence, other tobacco product, uncomplicated: Secondary | ICD-10-CM

## 2016-07-19 NOTE — Telephone Encounter (Signed)
Sandy Daniel [phone]   States that they are picking up care again for Hospital stay, will do PT. Also have requested that someone call him back\bout  Pt. medication

## 2016-07-20 MED ORDER — ACETAMINOPHEN 500 MG PO TABS: 500.0000 mg | ORAL_TABLET | Freq: Two times a day (BID) | ORAL | 0 refills | Status: DC | PRN

## 2016-07-20 MED ORDER — LIDOCAINE 5 % EX PTCH: 1.0000 | MEDICATED_PATCH | CUTANEOUS | 0 refills | Status: DC

## 2016-07-20 MED ORDER — NICOTINE 14 MG/24HR TD PT24: 14.0000 mg | MEDICATED_PATCH | Freq: Every day | TRANSDERMAL | 0 refills | Status: AC

## 2016-07-20 MED ORDER — NICOTINE 7 MG/24HR TD PT24: 7.0000 mg | MEDICATED_PATCH | Freq: Every day | TRANSDERMAL | 0 refills | Status: AC

## 2016-07-20 NOTE — Telephone Encounter (Signed)
Marvis Repress for confirmation of medications that need orders.  Refills placed for nicotine patch w/ step down in 2 weeks to 7 mg patch once daily.  Can continue if needed.  Lidoderm patch ordered.  At last visit, pt was having daughter buy OTC. Order placed if insurance covers.  Acetaminophen 500 mg bid prn ordered.  Appreciate medication reconciliation assistance from [facility].

## 2016-07-20 NOTE — Telephone Encounter (Signed)
As per Brayton Caves from [place] pt is not taking Benzonatate, Ensure, Neomycin eye drop. Pt is taking milk of magnesia for constipation, need Rx for nicotine patch, lidocaine patch also uses tylenol 2 of 500 mg daily for ache and pain.

## 2016-07-26 ENCOUNTER — Ambulatory Visit: Payer: Medicare HMO | Admitting: Gastroenterology

## 2016-07-26 ENCOUNTER — Encounter: Payer: Self-pay | Admitting: Gastroenterology

## 2016-08-06 ENCOUNTER — Telehealth: Payer: Self-pay | Admitting: Nurse Practitioner

## 2016-08-06 NOTE — Telephone Encounter (Signed)
Denyse Amass with [facility] said he finished up the home health visits and discharging pt with goals met.  His call back number is [phone]

## 2016-08-12 ENCOUNTER — Emergency Department
Admission: EM | Admit: 2016-08-12 | Discharge: 2016-08-12 | Disposition: A | Discharge status: Home / Self Care | Payer: Medicare HMO | Attending: Emergency Medicine | Admitting: Emergency Medicine

## 2016-08-12 ENCOUNTER — Emergency Department: Payer: Medicare HMO

## 2016-08-12 ENCOUNTER — Encounter: Payer: Self-pay | Admitting: Emergency Medicine

## 2016-08-12 DIAGNOSIS — Y9301 Activity, walking, marching and hiking: Secondary | ICD-10-CM | POA: Insufficient documentation

## 2016-08-12 DIAGNOSIS — J45909 Unspecified asthma, uncomplicated: Secondary | ICD-10-CM | POA: Insufficient documentation

## 2016-08-12 DIAGNOSIS — I10 Essential (primary) hypertension: Secondary | ICD-10-CM | POA: Diagnosis not present

## 2016-08-12 DIAGNOSIS — F17210 Nicotine dependence, cigarettes, uncomplicated: Secondary | ICD-10-CM | POA: Insufficient documentation

## 2016-08-12 DIAGNOSIS — W010XXA Fall on same level from slipping, tripping and stumbling without subsequent striking against object, initial encounter: Secondary | ICD-10-CM | POA: Insufficient documentation

## 2016-08-12 DIAGNOSIS — S6992XA Unspecified injury of left wrist, hand and finger(s), initial encounter: Secondary | ICD-10-CM | POA: Diagnosis present

## 2016-08-12 DIAGNOSIS — Y999 Unspecified external cause status: Secondary | ICD-10-CM | POA: Insufficient documentation

## 2016-08-12 DIAGNOSIS — Z7982 Long term (current) use of aspirin: Secondary | ICD-10-CM | POA: Insufficient documentation

## 2016-08-12 DIAGNOSIS — J449 Chronic obstructive pulmonary disease, unspecified: Secondary | ICD-10-CM | POA: Insufficient documentation

## 2016-08-12 DIAGNOSIS — S62012A Displaced fracture of distal pole of navicular [scaphoid] bone of left wrist, initial encounter for closed fracture: Secondary | ICD-10-CM | POA: Diagnosis not present

## 2016-08-12 DIAGNOSIS — I2510 Atherosclerotic heart disease of native coronary artery without angina pectoris: Secondary | ICD-10-CM | POA: Insufficient documentation

## 2016-08-12 DIAGNOSIS — Z79899 Other long term (current) drug therapy: Secondary | ICD-10-CM | POA: Insufficient documentation

## 2016-08-12 DIAGNOSIS — S62102A Fracture of unspecified carpal bone, left wrist, initial encounter for closed fracture: Secondary | ICD-10-CM

## 2016-08-12 DIAGNOSIS — Y929 Unspecified place or not applicable: Secondary | ICD-10-CM | POA: Insufficient documentation

## 2016-08-12 DIAGNOSIS — W19XXXA Unspecified fall, initial encounter: Secondary | ICD-10-CM

## 2016-08-12 MED ORDER — MORPHINE SULFATE (PF) 4 MG/ML IV SOLN
4.0000 mg | Freq: Once | INTRAVENOUS | Status: AC
  Administered 2016-08-12: 4 mg via INTRAMUSCULAR
  Filled 2016-08-12: qty 1

## 2016-08-12 MED ORDER — HYDROCODONE-ACETAMINOPHEN 5-325 MG PO TABS
1.0000 | ORAL_TABLET | Freq: Once | ORAL | Status: AC
  Administered 2016-08-12: 1 via ORAL
  Filled 2016-08-12: qty 1

## 2016-08-12 MED ORDER — DOCUSATE SODIUM 100 MG PO CAPS: ORAL_CAPSULE | ORAL | 0 refills | Status: DC

## 2016-08-12 MED ORDER — ONDANSETRON 4 MG PO TBDP
4.0000 mg | ORAL_TABLET | Freq: Once | ORAL | Status: AC
  Administered 2016-08-12: 4 mg via ORAL
  Filled 2016-08-12: qty 1

## 2016-08-12 MED ORDER — HYDROCODONE-ACETAMINOPHEN 5-325 MG PO TABS: 1.0000 | ORAL_TABLET | Freq: Four times a day (QID) | ORAL | 0 refills | Status: DC | PRN

## 2016-08-12 NOTE — Discharge Instructions (Signed)
You have been seen in the Emergency Department (ED) today for a fall.  You have a wrist fracture for which we placed you in a splint.  Please take over-the-counter ibuprofen and/or Tylenol as needed for your pain (unless you have an allergy or your doctor as told you not to take them), or take any prescribed medication as instructed.  Take Norco as prescribed for severe pain. Do not drink alcohol, drive or participate in any other potentially dangerous activities while taking this medication as it may make you sleepy. Do not take this medication with any other sedating medications, either prescription or over-the-counter. If you were prescribed Percocet or Vicodin, do not take these with acetaminophen (Tylenol) as it is already contained within these medications.   This medication is an opiate (or narcotic) pain medication and can be habit forming.  Use it as little as possible to achieve adequate pain control.  Do not use or use it with extreme caution if you have a history of opiate abuse or dependence.  If you are on a pain contract with your primary care doctor or a pain specialist, be sure to let them know you were prescribed this medication today from the [facility] Emergency Department.  This medication is intended for your use only - do not give any to anyone else and keep it in a secure place where nobody else, especially children, have access to it.  It will also cause or worsen constipation, so you may want to consider taking an over-the-counter stool softener while you are taking this medication.  Please follow up with Dr. Ernest Pine or one of his orthopedic colleagues on Monday for an appointment in the clinic later in the week.  Return to the ED if you have any headache, confusion, slurred speech, weakness/numbness of any arm or leg, or any increased pain.

## 2016-08-12 NOTE — ED Provider Notes (Signed)
[facility] Emergency Department Provider Note  ____________________________________________   First MD Initiated Contact with Patient 08/12/16 0300     (approximate)  I have reviewed the triage vital signs and the nursing notes.   HISTORY  Chief Complaint Fall    HPI Sandy Daniel is a 83 y.o. female who presents by EMS for evaluation of left wrist pain.  She reports that she fell on a walk in the afternoon and landed with most of her weight on her left wrist.  She also bumped her head near her eye but she has no headache and no neck pain.  She did not lose consciousness.  At first she hoped that she did not have any serious injuries but she is developed some swelling and gradually worsening pain over time in her left wrist.  She has no numbness or tingling in the hand and still is able to move her fingers but any movement of the wrist causes severe sharp and aching pain.  She denies fever/chills, chest pain, shortness of breath, nausea, vomiting, abdominal pain.She reports that she does not take any blood thinners.   Past Medical History:  Diagnosis Date  . Arthritis   . Asthma   . B12 deficiency   . CAD (coronary artery disease)   . COPD (chronic obstructive pulmonary disease) (HCC)   . Depression   . Epilepsy (HCC)   . GERD (gastroesophageal reflux disease)   . Hyperlipidemia   . Hypertension   . Hypoxemia   . Macular degeneration (senile) of retina   . Macular degeneration of both eyes   . MI (myocardial infarction) (HCC) 09/2003, 05/2013  . Physiological tremor   . Presence of stent of CABG    x2   . Seizures [facility])     Patient Active Problem List   Diagnosis Date Noted  . Anxiety 07/16/2016  . Chronic bilateral low back pain 07/16/2016  . Unstable gait 07/16/2016  . COPD exacerbation (HCC) 07/11/2016  . Hallucinations 06/07/2016  . Self-mutilation 06/07/2016  . Controlled substance agreement signed 03/27/2016  . Dysphagia  03/26/2016  . Panic disorder 03/26/2016  . CAD (coronary artery disease)   . Seizure disorder (HCC)   . Macular degeneration of both eyes   . B12 deficiency   . COPD (chronic obstructive pulmonary disease) (HCC)   . HTN (hypertension)   . Hyperlipidemia   . Arthritis   . Depression   . GERD (gastroesophageal reflux disease)   . Nasal congestion 04/02/2015  . Increased weakness when ambulating 06/21/2014  . Physiological tremor 02/18/2014  . Chronic headache 12/08/2012    Past Surgical History:  Procedure Laterality Date  . ABDOMINAL HYSTERECTOMY    . APPENDECTOMY    . CARDIAC CATHETERIZATION  05/2013  . CARDIAC SURGERY    . CATARACT EXTRACTION  03/2016  . CHOLECYSTECTOMY  1962  . CORONARY ANGIOPLASTY WITH STENT PLACEMENT  Aug. 2005 and March 2015  . ROTATOR CUFF REPAIR Right   . VENTRAL HERNIA REPAIR  2002    Prior to Admission medications   Medication Sig Start Date End Date Taking? Authorizing Provider  acetaminophen (TYLENOL) 500 MG tablet Take 1 tablet (500 mg total) by mouth 2 (two) times daily as needed for moderate pain. 07/20/16   Galen Manila, NP  albuterol (PROVENTIL HFA;VENTOLIN HFA) 108 (90 Base) MCG/ACT inhaler Inhale 2 puffs into the lungs every 6 (six) hours as needed for wheezing or shortness of breath. 04/03/16   Steele Sizer, MD  albuterol (PROVENTIL) (2.5 MG/3ML) 0.083% nebulizer solution Take 3 mLs (2.5 mg total) by nebulization every 6 (six) hours as needed for Wheezing. 03/26/16   Olevia Perches P, DO  aspirin 81 MG tablet Take 1 tablet (81 mg total) by mouth daily. 07/16/16   Galen Manila, NP  atorvastatin (LIPITOR) 10 MG tablet Take 1 tablet (10 mg total) by mouth daily. 05/29/16   Johnson, Megan P, DO  carvedilol (COREG) 3.125 MG tablet Take 1 tablet (3.125 mg total) by mouth daily. 03/26/16   Johnson, Megan P, DO  chlorpheniramine-HYDROcodone (TUSSIONEX) 10-8 MG/5ML SUER Take 5 mLs by mouth every 12 (twelve) hours. 07/13/16   Ramonita Lab,  MD  docusate sodium (COLACE) 100 MG capsule Take 1 tablet once or twice daily as needed for constipation while taking narcotic pain medicine 08/12/16   Loleta Rose, MD  Fluticasone-Salmeterol (ADVAIR) 100-50 MCG/DOSE AEPB Inhale 1 puff into the lungs 2 (two) times daily. 07/16/16   Galen Manila, NP  gabapentin (NEURONTIN) 100 MG capsule Take 2 capsules (200 mg total) by mouth 2 (two) times daily. 07/16/16   Galen Manila, NP  HYDROcodone-acetaminophen (NORCO/VICODIN) 5-325 MG tablet Take 1-2 tablets by mouth every 6 (six) hours as needed for moderate pain. 08/12/16   Loleta Rose, MD  levofloxacin (LEVAQUIN) 750 MG tablet  07/13/16   [provider]  lidocaine (LIDODERM) 5 % Place 1 patch onto the skin daily. Remove & Discard patch within 12 hours or as directed by MD 07/20/16   Galen Manila, NP  LORazepam (ATIVAN) 0.5 MG tablet Take 1 tablet (0.5 mg total) by mouth 2 (two) times daily as needed for anxiety. 07/16/16   Galen Manila, NP  magnesium hydroxide (MILK OF MAGNESIA) 400 MG/5ML suspension Take 15 mLs by mouth daily as needed for mild constipation.    [provider]  mupirocin cream (BACTROBAN) 2 % Apply 1 application topically 2 (two) times daily. 05/29/16   Olevia Perches P, DO  neomycin-polymyxin b-dexamethasone (MAXITROL) 3.5-10000-0.1 SUSP  06/14/16   [provider]  nicotine (NICODERM CQ - DOSED IN MG/24 HR) 7 mg/24hr patch Place 1 patch (7 mg total) onto the skin daily. 08/03/16 08/17/16  Galen Manila, NP  nitroGLYCERIN (NITROSTAT) 0.4 MG SL tablet Place 1 tablet (0.4 mg total) under the tongue every 5 (five) minutes as needed for chest pain. Patient not taking: Reported on 07/16/2016 03/26/16   Olevia Perches P, DO  pantoprazole (PROTONIX) 40 MG tablet Take 1 tablet (40 mg total) by mouth daily. 07/14/16   Ramonita Lab, MD  phenytoin (DILANTIN) 100 MG ER capsule Take 2 capsules (200 mg total) by mouth at bedtime. Take 2 by mouth at  bedtime 03/26/16   Olevia Perches P, DO  QUEtiapine (SEROQUEL) 50 MG tablet Take 1 tablet (50 mg total) by mouth at bedtime. Patient taking differently: Take 25 mg by mouth at bedtime.  06/20/16   Galen Manila, NP  sertraline (ZOLOFT) 100 MG tablet Take 1 tablet (100 mg total) by mouth daily. 07/13/16   Ramonita Lab, MD  Tiotropium Bromide-Olodaterol (STIOLTO RESPIMAT) 2.5-2.5 MCG/ACT AERS Inhale 2.5 sprays into the lungs every morning. 03/26/16   Dorcas Carrow, DO  traMADol Janean Sark) 50 MG tablet  06/18/16   [provider]    Allergies Motrin [ibuprofen] and Tramadol hcl  Family History  Problem Relation Age of Onset  . Diabetes Mother   . Cancer Sister   . Diabetes Sister   . Heart disease  Sister   . Heart attack Sister   . Hyperlipidemia Sister   . Hypertension Sister   . Cancer Brother   . Diabetes Brother   . Heart disease Brother   . Heart attack Brother   . Hyperlipidemia Brother   . Hypertension Brother   . Diabetes Brother   . Diabetes Brother   . Kidney failure Brother     Social History Social History  Substance Use Topics  . Smoking status: Current Every Day Smoker    Packs/day: 1.00    Years: 50.00    Types: Cigarettes  . Smokeless tobacco: Never Used  . Alcohol use No    Review of Systems Constitutional: No fever/chills Eyes: No visual changes. ENT: No sore throat. Cardiovascular: Denies chest pain. Respiratory: Denies shortness of breath. Gastrointestinal: No abdominal pain.  No nausea, no vomiting.  No diarrhea.  No constipation. Genitourinary: Negative for dysuria. Musculoskeletal: Pain and swelling in left wrist after a fall on concrete Integumentary: Negative for rash. Neurological: Negative for headaches, focal weakness or numbness.   ____________________________________________   PHYSICAL EXAM:  VITAL SIGNS: ED Triage Vitals  Enc Vitals Group     BP 08/12/16 0155 (!) 145/54     Pulse Rate 08/12/16 0155 63     Resp  08/12/16 0155 20     Temp 08/12/16 0155 97.8 F (36.6 C)     Temp Source 08/12/16 0155 Oral     SpO2 08/12/16 0155 95 %     Weight 08/12/16 0154 74 kg (163 lb 2.3 oz)     Height --      Head Circumference --      Peak Flow --      Pain Score 08/12/16 0153 10     Pain Loc --      Pain Edu? --      Excl. in GC? --     Constitutional: Alert and oriented. Generally well appearing but in moderate distress from pain Eyes: Conjunctivae are normal.  Head: Small superficial closed laceration above left eyebrow without any surrounding hematoma, tenderness, or ecchymosis Nose: No congestion/rhinnorhea.  No epistaxis Mouth/Throat: Mucous membranes are moist. Neck: No stridor.  No meningeal signs.  No cervical spine tenderness to palpation. Cardiovascular: Normal rate, regular rhythm. Good peripheral circulation. Grossly normal heart sounds. Respiratory: Normal respiratory effort.  No retractions. Lungs CTAB. Gastrointestinal: Soft and nontender. No distention.  Musculoskeletal: Pain and swelling around the left wrist with severe tenderness to palpation.  Neurovascularly intact, able to move fingers.  Neurologic:  Normal speech and language. No gross focal neurologic deficits are appreciated.  Skin:  Skin is warm, dry and intact. No rash noted. Psychiatric: Mood and affect are normal. Speech and behavior are normal.  ____________________________________________   LABS (all labs ordered are listed, but only abnormal results are displayed)  Labs Reviewed - No data to display ____________________________________________  EKG  None - EKG not ordered by ED physician ____________________________________________  RADIOLOGY   Dg Wrist Complete Left  Result Date: 08/12/2016 CLINICAL DATA:  Left wrist pain and swelling after fall tonight EXAM: LEFT WRIST - COMPLETE 3+ VIEW COMPARISON:  None. FINDINGS: There is a comminuted intra-articular distal radius fracture, well aligned and mildly  impacted. There is a fracture of the ulnar styloid. There is extensive chondrocalcinosis at the wrist. There are severe arthritic changes at the first carpometacarpal joint with radial subluxation. IMPRESSION: 1. Intra-articular fracture of the distal radius with mild impaction. Fracture across the ulnar styloid. 2. Chondrocalcinosis  and severe arthritic changes of the wrist. Electronically Signed   By: Ellery Plunk M.D.   On: 08/12/2016 02:49    ____________________________________________   PROCEDURES  Critical Care performed: No   Procedure(s) performed:   .Splint Application Date/Time: 08/12/2016 3:19 AM Performed by: Loleta Rose Authorized by: Loleta Rose   Consent:    Consent obtained:  Verbal Pre-procedure details:    Sensation:  Normal Procedure details:    Laterality:  Left   Location:  Wrist   Wrist:  L wrist   Splint type:  Sugar tong   Supplies:  Ortho-Glass Post-procedure details:    Pain:  Unchanged   Sensation:  Normal   Patient tolerance of procedure:  Tolerated well, no immediate complications     ____________________________________________   INITIAL IMPRESSION / ASSESSMENT AND PLAN / ED COURSE  Pertinent labs & imaging results that were available during my care of the patient were reviewed by me and considered in my medical decision making (see chart for details).  The patient has no clinically significant head injury and no headache and no neck pain or stiffness.  Her injury seems confined to the left wrist.  It is well aligned with no angulation.  We will place her in a sugar tong splint and advise close outpatient follow-up with orthopedics.  Due to the amount of pain she was experiencing I provided a dose of morphine intramuscular and then will follow up with oral pain medications.  She is neurovascularly intact in the hand distal to the injury.   Clinical Course as of Aug 12 705  Wynelle Link Aug 12, 2016  1610 The patient has been stable  throughout her period of observation in the emergency department.  Splint is in place, patient is neurovascularly intact distal to the injury.I reviewed the patient's prescription history over the last 12 months in the [place] controlled substances database(s) .  Results were notable for multiple prescriptions for lorazepam as well as for pain medications including tramadol and oxycodone, but she receives no more than one prescription a month from Dr. Olevia Perches, and her last prescription was prescribed close to months ago and it was for tramadol which she states that she is unable to tolerate due to nausea and vomiting.  Given her acute injury and acute pain, I am providing a prescription for Norco which she states she has tolerated in the past.  I will up with Dr. Ernest Pine this coming week.  I gave my usual and customary return precautions.     [CF]  0705 The patient was sleeping lightly but I reassessed her because I was informed that she was still complaining of pain in her wrist.  I woke her up and she said that she is hurting quite a bit but she is still neurovascularly intact with soft compartments from what I can feel underneath the splint and the splint is as loose as we can get it is still have not provided appropriate support.  Normal capillary refill, CMS intact.  We will provide another Norco.  [CF]    Clinical Course User Index [CF] Loleta Rose, MD    ____________________________________________  FINAL CLINICAL IMPRESSION(S) / ED DIAGNOSES  Final diagnoses:  Fall, initial encounter  Left wrist fracture, closed, initial encounter     MEDICATIONS GIVEN DURING THIS VISIT:  Medications  HYDROcodone-acetaminophen (NORCO/VICODIN) 5-325 MG per tablet 1 tablet (not administered)  morphine 4 MG/ML injection 4 mg (4 mg Intramuscular Given 08/12/16 0317)  ondansetron (ZOFRAN-ODT) disintegrating tablet 4  mg (4 mg Oral Given 08/12/16 0318)  HYDROcodone-acetaminophen (NORCO/VICODIN) 5-325 MG per  tablet 1 tablet (1 tablet Oral Given 08/12/16 0454)     NEW OUTPATIENT MEDICATIONS STARTED DURING THIS VISIT:  New Prescriptions   DOCUSATE SODIUM (COLACE) 100 MG CAPSULE    Take 1 tablet once or twice daily as needed for constipation while taking narcotic pain medicine   HYDROCODONE-ACETAMINOPHEN (NORCO/VICODIN) 5-325 MG TABLET    Take 1-2 tablets by mouth every 6 (six) hours as needed for moderate pain.    Modified Medications   No medications on file    Discontinued Medications   No medications on file     Note:  This document was prepared using Dragon voice recognition software and may include unintentional dictation errors.    Loleta Rose, MD 08/12/16 [phone]

## 2016-08-12 NOTE — ED Notes (Signed)
Pt given written and verbal discharge instruction with grandson present.

## 2016-08-12 NOTE — ED Notes (Signed)
Attempted to call pt's daughter in law regarding discharge for ride home per pt request. No answer after 4 tries. Pt notified.

## 2016-08-12 NOTE — ED Notes (Signed)
Patient reporting left wrist pain after a fall yesterday afternoon. Slight deformity noted, good radial pulse, cap refill with in normal limits.

## 2016-08-12 NOTE — ED Notes (Signed)
Pt states arm continues to be painful, but is improved after splint appliecation. Cms intact to left fingers.

## 2016-08-12 NOTE — ED Notes (Signed)
md notified of continued pain. Pt informed will attempt splint at this time.

## 2016-08-12 NOTE — ED Notes (Signed)
Pt complains of increased pain to wrist. md notified. Cms remains intact.

## 2016-08-12 NOTE — ED Notes (Signed)
Attempt to call pt's daughter in law for transport again x2 without success. Pt updated previously on progression of ride.

## 2016-08-12 NOTE — ED Notes (Signed)
Report to kim, rn.  

## 2016-08-12 NOTE — ED Triage Notes (Signed)
Patient to rm 2 via EMS from home.  Per EMS and patient she fell yesterday afternoon around 2 pm.  Patient reports tripped because of dog.  Patient complains of left wrist pain.

## 2016-08-21 ENCOUNTER — Ambulatory Visit (INDEPENDENT_AMBULATORY_CARE_PROVIDER_SITE_OTHER): Payer: Medicare HMO | Admitting: Nurse Practitioner

## 2016-08-21 ENCOUNTER — Other Ambulatory Visit: Payer: Self-pay | Admitting: Nurse Practitioner

## 2016-08-21 ENCOUNTER — Encounter: Payer: Self-pay | Admitting: Nurse Practitioner

## 2016-08-21 VITALS — BP 155/68 | HR 66 | Temp 98.5°F | Ht 60.0 in | Wt 156.8 lb

## 2016-08-21 DIAGNOSIS — W19XXXD Unspecified fall, subsequent encounter: Secondary | ICD-10-CM | POA: Diagnosis not present

## 2016-08-21 DIAGNOSIS — S0292XD Unspecified fracture of facial bones, subsequent encounter for fracture with routine healing: Secondary | ICD-10-CM

## 2016-08-21 MED ORDER — ATORVASTATIN CALCIUM 10 MG PO TABS: 10.0000 mg | ORAL_TABLET | Freq: Every day | ORAL | 1 refills | Status: DC

## 2016-08-21 MED ORDER — OXYCODONE HCL 5 MG PO CAPS: 5.0000 mg | ORAL_CAPSULE | ORAL | 0 refills | Status: AC | PRN

## 2016-08-21 MED ORDER — OXYCODONE HCL 5 MG PO CAPS: 5.0000 mg | ORAL_CAPSULE | ORAL | 0 refills | Status: DC | PRN

## 2016-08-21 MED ORDER — SERTRALINE HCL 100 MG PO TABS: 100.0000 mg | ORAL_TABLET | Freq: Every day | ORAL | 0 refills | Status: DC

## 2016-08-21 MED ORDER — CARVEDILOL 3.125 MG PO TABS: 3.1250 mg | ORAL_TABLET | Freq: Every day | ORAL | 1 refills | Status: DC

## 2016-08-21 MED ORDER — PHENYTOIN SODIUM EXTENDED 100 MG PO CAPS: 200.0000 mg | ORAL_CAPSULE | Freq: Every day | ORAL | 1 refills | Status: DC

## 2016-08-21 MED ORDER — PANTOPRAZOLE SODIUM 40 MG PO TBEC: 40.0000 mg | DELAYED_RELEASE_TABLET | Freq: Every day | ORAL | 0 refills | Status: DC

## 2016-08-21 MED ORDER — GABAPENTIN 100 MG PO CAPS: 200.0000 mg | ORAL_CAPSULE | Freq: Three times a day (TID) | ORAL | 0 refills | Status: DC

## 2016-08-21 NOTE — Patient Instructions (Addendum)
Maziyah, Thank you for coming in to clinic today.  1. For your pain - TAKE acetaminophen (Tylenol) extra strength 2 tablets every 8 hours. - TAKE oxycodone 5 mg every 4 hours as needed for 3 days. - Put ice on your arm 4 times per day for 15 minutes.  2. For your fall - Make sure you are using your cane indoors and your walker outside. - We need to make sure you don't have osteoporosis.   Please schedule a follow-up appointment with [name], AGNP to Return in about 2 months (around 10/22/2016) for for chronic disease followup.  Also make appt for arm as needed and with orthopedics in 4-6 weeks.  If you have any other questions or concerns, please feel free to call the clinic or send a message through MyChart. You may also schedule an earlier appointment if necessary.  [name], DNP, AGNP-BC Adult Gerontology Nurse Practitioner [facility], [facility]

## 2016-08-21 NOTE — Progress Notes (Signed)
Subjective:    Patient ID: Sandy Daniel, female    DOB: 1928-06-28, 83 y.o.   MRN: [id number]  Sandy Daniel is a 83 y.o. female presenting on 08/21/2016 for COPD (f/u ER )   HPI ED visit f/u for Fall: ED visit date: 08/12/2016 Diagnosis: Fall w/ wrist fracture   DG left wrist 08/12/2016 Impression: - There is comminuted intra-articular distal radius fracture, well aligned and mildly impacted. There is a fracture of the ulnar styloid. - Pt provided 5 day supply of Hydrocodone/APAP 5 mg-325 mg as 1-2 tabs every 4 hours (#30). Follow up recommendations: none provided other than to see PCP and orthopedics.  Orthopedic appointment not completed yet.  Interval history:  Fall occurred after neighbor's dog ran into her leg and she lost her balance.  Pt was on sidewalk when she fell and her neighbors helped her up.  She was not using her walker outside of her home as instructed by PT on home health discharge 2 weeks ago.   She was using her cane instead. She was approved to use only her cane when inside her home.  Fell over walker wheel several months ago (prior to home PT), so this is her 2nd fall in 3 months.  Left wrist pain is 10/10 pain now and is described as intermittently throbbing and sharp, but nearly constant aching. Pt states she "feels like someone cut off her hand." Pt has completed her supply of hydrocodone/apap and is requesting more medication for pain relief. NCCSRS reviewed and other than oxycodone previously prescribed by Dr. Laural Benes and the hydrocodone prescription from the ED visit, there are no other fills of controlled substances.  Pt is not sure of her osteoporosis/osteopenia status of her last bone density scan in 2014.  Imaging and report is not available, but pt believes she has "brittle bones."    Social History  Substance Use Topics  . Smoking status: Current Every Day Smoker    Packs/day: 1.00    Years: 50.00    Types: Cigarettes  . Smokeless tobacco:  Never Used  . Alcohol use No    Review of Systems Per HPI unless specifically indicated above     Objective:    BP (!) 155/68 (BP Location: Right Arm, Patient Position: Sitting, Cuff Size: Normal)   Pulse 66   Temp 98.5 F (36.9 C) (Oral)   Ht 5' (1.524 m)   Wt 156 lb 12.8 oz (71.1 kg)   SpO2 98%   BMI 30.62 kg/m   Wt Readings from Last 3 Encounters:  08/21/16 156 lb 12.8 oz (71.1 kg)  08/12/16 163 lb 2.3 oz (74 kg)  07/16/16 159 lb 6.4 oz (72.3 kg)    Physical Exam  Constitutional: She appears well-developed and well-nourished. No distress.  HENT:  Head: Normocephalic.    Right Ear: External ear normal. Decreased hearing is noted.  Left Ear: External ear normal. Decreased hearing is noted.  Musculoskeletal:       Left wrist: She exhibits tenderness, swelling and deformity.       Right forearm: She exhibits tenderness, swelling and deformity.       Right hand: She exhibits tenderness and swelling. She exhibits normal capillary refill, no deformity and no laceration. Decreased strength noted.  Pt left wrist in splint cast as placed by ED. Fingers and hand edematous.  Antalgic gait, weaving back and forth.  Using walker w/ left elbow (leaning toward left side) and right hand for support.  Neurological: She is alert.  Disoriented to situation Oriented to person, time, and place  Skin: Skin is warm and dry.  Psychiatric: Her speech is normal. Thought content normal. Her affect is labile. She is agitated. Cognition and memory are impaired. She expresses impulsivity. She exhibits abnormal recent memory.     Results for orders placed or performed during the hospital encounter of 07/11/16  Blood culture (routine x 2)  Result Value Ref Range   Specimen Description BLOOD   RIGHT AC    Special Requests BOTTLES DRAWN AEROBIC AND ANAEROBIC  BCAV    Culture NO GROWTH 5 DAYS    Report Status 07/16/2016 FINAL   Blood culture (routine x 2)  Result Value Ref Range   Specimen  Description BLOOD  RIGHT AC    Special Requests BOTTLES DRAWN AEROBIC AND ANAEROBIC  BCAV    Culture NO GROWTH 5 DAYS    Report Status 07/16/2016 FINAL   CBC with Differential  Result Value Ref Range   WBC 5.6 3.6 - 11.0 K/uL   RBC 4.26 3.80 - 5.20 MIL/uL   Hemoglobin 12.7 12.0 - 16.0 g/dL   HCT 11.1 73.5 - 67.0 %   MCV 89.4 80.0 - 100.0 fL   MCH 29.7 26.0 - 34.0 pg   MCHC 33.2 32.0 - 36.0 g/dL   RDW 14.1 (H) 03.0 - 13.1 %   Platelets 270 150 - 440 K/uL   Neutrophils Relative % 63 %   Neutro Abs 3.6 1.4 - 6.5 K/uL   Lymphocytes Relative 24 %   Lymphs Abs 1.4 1.0 - 3.6 K/uL   Monocytes Relative 11 %   Monocytes Absolute 0.6 0.2 - 0.9 K/uL   Eosinophils Relative 1 %   Eosinophils Absolute 0.1 0 - 0.7 K/uL   Basophils Relative 1 %   Basophils Absolute 0.0 0 - 0.1 K/uL  Comprehensive metabolic panel  Result Value Ref Range   Sodium 141 135 - 145 mmol/L   Potassium 4.2 3.5 - 5.1 mmol/L   Chloride 107 101 - 111 mmol/L   CO2 26 22 - 32 mmol/L   Glucose, Bld 98 65 - 99 mg/dL   BUN 24 (H) 6 - 20 mg/dL   Creatinine, Ser 4.38 0.44 - 1.00 mg/dL   Calcium 9.2 8.9 - 88.7 mg/dL   Total Protein 7.8 6.5 - 8.1 g/dL   Albumin 3.7 3.5 - 5.0 g/dL   AST 24 15 - 41 U/L   ALT 18 14 - 54 U/L   Alkaline Phosphatase 175 (H) 38 - 126 U/L   Total Bilirubin 0.6 0.3 - 1.2 mg/dL   GFR calc non Af Amer >60 >60 mL/min   GFR calc Af Amer >60 >60 mL/min   Anion gap 8 5 - 15  Troponin I  Result Value Ref Range   Troponin I <0.03 <0.03 ng/mL  Brain natriuretic peptide  Result Value Ref Range   B Natriuretic Peptide 105.0 (H) 0.0 - 100.0 pg/mL  Magnesium  Result Value Ref Range   Magnesium 2.0 1.7 - 2.4 mg/dL  Fibrin derivatives D-Dimer ([facility] only)  Result Value Ref Range   Fibrin derivatives D-dimer ([facility]) 2,359.92 (H) 0.00 - 499.00  Basic metabolic panel  Result Value Ref Range   Sodium 141 135 - 145 mmol/L   Potassium 3.7 3.5 - 5.1 mmol/L   Chloride 107 101 - 111 mmol/L   CO2 28 22 - 32  mmol/L   Glucose, Bld 76 65 - 99 mg/dL   BUN 22 (H) 6 -  20 mg/dL   Creatinine, Ser 1.61 0.44 - 1.00 mg/dL   Calcium 8.6 (L) 8.9 - 10.3 mg/dL   GFR calc non Af Amer >60 >60 mL/min   GFR calc Af Amer >60 >60 mL/min   Anion gap 6 5 - 15  CBC  Result Value Ref Range   WBC 5.8 3.6 - 11.0 K/uL   RBC 3.73 (L) 3.80 - 5.20 MIL/uL   Hemoglobin 11.5 (L) 12.0 - 16.0 g/dL   HCT 09.6 (L) 04.5 - 40.9 %   MCV 90.3 80.0 - 100.0 fL   MCH 30.9 26.0 - 34.0 pg   MCHC 34.2 32.0 - 36.0 g/dL   RDW 81.1 (H) 91.4 - 78.2 %   Platelets 226 150 - 440 K/uL  ECHOCARDIOGRAM COMPLETE  Result Value Ref Range   Weight 2,560 oz   Height 59 in   BP 153/69 mmHg      Assessment & Plan:   Problem List Items Addressed This Visit    None    Visit Diagnoses    Closed fracture of face bones due to fall with routine healing, subsequent encounter    -  Primary Pt w/ initial splint as only supportive device for her wrist.  Confusion of pt and daughter about need for [facility] Orthopedic appointment.  Pain ongoing and pt feels is not improving.  No fall since 08/21/2016, but pt at high risk for fall w/ current method for using her walker.  Plan: 1. Provided 3 day supply of oxycodone IR 5 mg.  Take every 4 hours as needed. 2. Take acetaminophen every 8 hours to provide continual pain medication. 3. Follow up w/ orthopedic.  [facility] notified that pt will need to be seen for more permanent fixation device. 4. Encouraged pt to use her walker correctly.  Patient would benefit from handle extender w/ arm rest for her left arm to increase stability and prevent fall.   Relevant Medications   oxycodone (OXY-IR) 5 MG capsule   Other Relevant Orders   DG Bone Density   Fall with injury, subsequent encounter     See above. Also, no current assessment of patient's bone health. Unknown if pt has osteopenia or osteoporosis.  Plan: 1. Obtain bone density scan.  Consider treatment to prevent further bone loss if  osteoporotic. 2. Follow up after results received and as needed.   Relevant Orders   DG Bone Density      Meds ordered this encounter  Medications  . oxycodone (OXY-IR) 5 MG capsule    Sig: Take 1 capsule (5 mg total) by mouth every 4 (four) hours as needed.    Dispense:  18 capsule    Refill:  0    Order Specific Question:   Supervising Provider    Answer:   Smitty Cords [[id number]]      Follow up plan: Return in about 2 months (around 10/22/2016) for for chronic disease followup.  Also make appt for arm as needed and with orthopedics in 4-6 weeks.  [name], DNP, AGPCNP-BC Adult Gerontology Primary Care Nurse Practitioner [facility] 08/21/2016, 1:06 PM

## 2016-08-22 ENCOUNTER — Telehealth: Payer: Self-pay

## 2016-08-22 NOTE — Telephone Encounter (Signed)
-----   Message from Galen Manila, NP sent at 08/21/2016  1:08 PM EDT ----- Regarding: [facility] Orth[facility] appointment? Pt was told by [facility] ortho that she didn't need to be seen.  Pt still in orthopedic splint as placed by ED.  Pt claims never saw ortho when in ED.  Pt likely needs Ortho visit for more permanent supportive casting/splint device.  Please inquire.

## 2016-08-22 NOTE — Telephone Encounter (Signed)
Ortho appt scheduled for today, Wednesday, July 11th. The pt was notified.

## 2016-08-24 NOTE — Progress Notes (Signed)
I have reviewed this encounter including the documentation in this note and/or discussed this patient with the provider, [name], AGPCNP-BC. I am certifying that I agree with the content of this note as supervising physician.  Saralyn Pilar, DO [facility] 08/24/2016, 12:59 PM

## 2016-09-04 NOTE — Progress Notes (Signed)
   07/13/16 0906  Acute Rehab PT Goals  Patient Stated Goal to go home  PT Goal Formulation With patient  Time For Goal Achievement 07/27/16  Potential to Achieve Goals Good  PT Time Calculation  PT Start Time (ACUTE ONLY) 0840  PT Stop Time (ACUTE ONLY) 0854  PT Time Calculation (min) (ACUTE ONLY) 14 min  PT G-Codes **NOT FOR INPATIENT CLASS**  Functional Assessment Tool Used AM-PAC 6 Clicks Basic Mobility  Functional Limitation Mobility: Walking and moving around  Mobility: Walking and Moving Around Current Status (S1683) CJ  Mobility: Walking and Moving Around Goal Status (F2902) CH  PT General Charges  $$ ACUTE PT VISIT 1 Procedure  PT Evaluation  $PT Eval Low Complexity 1 Procedure   Late entry G-codes entered after review of initial documentation.  Hendricks Limes, PT 09/04/16, 1:43 PM [phone]

## 2016-09-10 ENCOUNTER — Encounter: Payer: Self-pay | Admitting: Nurse Practitioner

## 2016-09-10 ENCOUNTER — Ambulatory Visit (INDEPENDENT_AMBULATORY_CARE_PROVIDER_SITE_OTHER): Payer: Medicare HMO | Admitting: Nurse Practitioner

## 2016-09-10 VITALS — BP 143/65 | HR 74 | Temp 98.4°F | Ht 60.0 in | Wt 158.2 lb

## 2016-09-10 DIAGNOSIS — R609 Edema, unspecified: Secondary | ICD-10-CM

## 2016-09-10 DIAGNOSIS — R600 Localized edema: Secondary | ICD-10-CM

## 2016-09-10 DIAGNOSIS — I5030 Unspecified diastolic (congestive) heart failure: Secondary | ICD-10-CM

## 2016-09-10 MED ORDER — POTASSIUM CHLORIDE ER 20 MEQ PO TBCR: 10.0000 meq | EXTENDED_RELEASE_TABLET | Freq: Every day | ORAL | 0 refills | Status: DC

## 2016-09-10 MED ORDER — FUROSEMIDE 20 MG PO TABS: 20.0000 mg | ORAL_TABLET | Freq: Every day | ORAL | 0 refills | Status: DC

## 2016-09-10 NOTE — Progress Notes (Signed)
Subjective:    Patient ID: Sandy Daniel, female    DOB: 04-01-1928, 83 y.o.   MRN: [id number]  Sandy Daniel is a 83 y.o. female presenting on 09/10/2016 for Leg Swelling (bilateral feet x 3 days )   HPI Leg Swelling Noticed swelling start on Friday.  Yesterday was worst day.  Socks were creating indention and is resolving some today.  Swelling is causing worse pain in left arm w/ cast limiting space for swelling.  Does not have a scale at home and is not checking daily weights.  Diastolic HF w/ EF 55-60%.  Pt is not connected with cardiology.   Social History  Substance Use Topics  . Smoking status: Current Every Day Smoker    Packs/day: 1.00    Years: 50.00    Types: Cigarettes  . Smokeless tobacco: Never Used  . Alcohol use No    Review of Systems Per HPI unless specifically indicated above     Objective:    BP (!) 143/65 (BP Location: Right Arm, Patient Position: Sitting, Cuff Size: Normal)   Pulse 74   Temp 98.4 F (36.9 C) (Oral)   Ht 5' (1.524 m)   Wt 158 lb 3.2 oz (71.8 kg)   SpO2 96%   BMI 30.90 kg/m   Wt Readings from Last 3 Encounters:  09/10/16 158 lb 3.2 oz (71.8 kg)  08/21/16 156 lb 12.8 oz (71.1 kg)  08/12/16 163 lb 2.3 oz (74 kg)    Physical Exam   General - frail adult, well-appearing, NAD HEENT - Normocephalic, atraumatic Neck - supple, non-tender, no LAD, no thyromegaly, no carotid bruit, JVD w/o increase w/ abdominal pressure to RUQ Heart - RRR, no murmurs heard Lungs - Clear throughout all lobes, no wheezing, crackles, or rhonchi. Normal work of breathing. Extremeties - non-tender, R hand no edema, L hand + 1 pitting RLE +4 pitting edema, LLE + 2 pitting edema, cap refill < 2 seconds all extremities, peripheral pulses intact +2 bilaterally Skin - warm, dry Neuro - awake, alert, oriented x3, abnormal gait slanted to left  - pt walks w/ walker and supporting weight w/ left elbow Psych - Normal mood and affect, normal behavior     Results for orders placed or performed during the hospital encounter of 07/11/16  Blood culture (routine x 2)  Result Value Ref Range   Specimen Description BLOOD   RIGHT AC    Special Requests BOTTLES DRAWN AEROBIC AND ANAEROBIC  BCAV    Culture NO GROWTH 5 DAYS    Report Status 07/16/2016 FINAL   Blood culture (routine x 2)  Result Value Ref Range   Specimen Description BLOOD  RIGHT AC    Special Requests BOTTLES DRAWN AEROBIC AND ANAEROBIC  BCAV    Culture NO GROWTH 5 DAYS    Report Status 07/16/2016 FINAL   CBC with Differential  Result Value Ref Range   WBC 5.6 3.6 - 11.0 K/uL   RBC 4.26 3.80 - 5.20 MIL/uL   Hemoglobin 12.7 12.0 - 16.0 g/dL   HCT 40.9 81.1 - 91.4 %   MCV 89.4 80.0 - 100.0 fL   MCH 29.7 26.0 - 34.0 pg   MCHC 33.2 32.0 - 36.0 g/dL   RDW 78.2 (H) 95.6 - 21.3 %   Platelets 270 150 - 440 K/uL   Neutrophils Relative % 63 %   Neutro Abs 3.6 1.4 - 6.5 K/uL   Lymphocytes Relative 24 %   Lymphs Abs 1.4 1.0 - 3.6 K/uL  Monocytes Relative 11 %   Monocytes Absolute 0.6 0.2 - 0.9 K/uL   Eosinophils Relative 1 %   Eosinophils Absolute 0.1 0 - 0.7 K/uL   Basophils Relative 1 %   Basophils Absolute 0.0 0 - 0.1 K/uL  Comprehensive metabolic panel  Result Value Ref Range   Sodium 141 135 - 145 mmol/L   Potassium 4.2 3.5 - 5.1 mmol/L   Chloride 107 101 - 111 mmol/L   CO2 26 22 - 32 mmol/L   Glucose, Bld 98 65 - 99 mg/dL   BUN 24 (H) 6 - 20 mg/dL   Creatinine, Ser 1.61 0.44 - 1.00 mg/dL   Calcium 9.2 8.9 - 09.6 mg/dL   Total Protein 7.8 6.5 - 8.1 g/dL   Albumin 3.7 3.5 - 5.0 g/dL   AST 24 15 - 41 U/L   ALT 18 14 - 54 U/L   Alkaline Phosphatase 175 (H) 38 - 126 U/L   Total Bilirubin 0.6 0.3 - 1.2 mg/dL   GFR calc non Af Amer >60 >60 mL/min   GFR calc Af Amer >60 >60 mL/min   Anion gap 8 5 - 15  Troponin I  Result Value Ref Range   Troponin I <0.03 <0.03 ng/mL  Brain natriuretic peptide  Result Value Ref Range   B Natriuretic Peptide 105.0 (H) 0.0 -  100.0 pg/mL  Magnesium  Result Value Ref Range   Magnesium 2.0 1.7 - 2.4 mg/dL  Fibrin derivatives D-Dimer ([facility] only)  Result Value Ref Range   Fibrin derivatives D-dimer ([facility]) 2,359.92 (H) 0.00 - 499.00  Basic metabolic panel  Result Value Ref Range   Sodium 141 135 - 145 mmol/L   Potassium 3.7 3.5 - 5.1 mmol/L   Chloride 107 101 - 111 mmol/L   CO2 28 22 - 32 mmol/L   Glucose, Bld 76 65 - 99 mg/dL   BUN 22 (H) 6 - 20 mg/dL   Creatinine, Ser 0.45 0.44 - 1.00 mg/dL   Calcium 8.6 (L) 8.9 - 10.3 mg/dL   GFR calc non Af Amer >60 >60 mL/min   GFR calc Af Amer >60 >60 mL/min   Anion gap 6 5 - 15  CBC  Result Value Ref Range   WBC 5.8 3.6 - 11.0 K/uL   RBC 3.73 (L) 3.80 - 5.20 MIL/uL   Hemoglobin 11.5 (L) 12.0 - 16.0 g/dL   HCT 40.9 (L) 81.1 - 91.4 %   MCV 90.3 80.0 - 100.0 fL   MCH 30.9 26.0 - 34.0 pg   MCHC 34.2 32.0 - 36.0 g/dL   RDW 78.2 (H) 95.6 - 21.3 %   Platelets 226 150 - 440 K/uL  ECHOCARDIOGRAM COMPLETE  Result Value Ref Range   Weight 2,560 oz   Height 59 in   BP 153/69 mmHg      Assessment & Plan:   Problem List Items Addressed This Visit      Cardiovascular and Mediastinum   (HFpEF) heart failure with preserved ejection fraction (HCC)    Pt w/ stable respiratory status, worsening edema over last 5 days.  Plan: 1. Encouraged daily weights.  Purchase scale for home use. 2. Start furosemide 20 mg once daily x 3 days,  Repeat x 3 more days if symptoms persist. 3.. Take potassium chloride 20 meq once daily x 3 days and repeat if continued swelling and taking furosemide. 4. Consider cardiology referral if repeat symptoms. 5. Encouraged low salt diet. 6. Consider BNP check w/ next labs. 7. Follow  up as needed and at next scheduled visit in September.      Relevant Medications   furosemide (LASIX) 20 MG tablet    Other Visit Diagnoses    Peripheral edema    -  Primary   Acutely worsening pedal and left arm edema.  Plan: see HFpEF above   Relevant  Medications   furosemide (LASIX) 20 MG tablet   potassium chloride 20 MEQ TBCR   Other Relevant Orders   Compression stockings      Meds ordered this encounter  Medications  . furosemide (LASIX) 20 MG tablet    Sig: Take 1 tablet (20 mg total) by mouth daily. For 3 days,  Repeat for 3 more days if swelling persists    Dispense:  6 tablet    Refill:  0    Order Specific Question:   Supervising Provider    Answer:   Smitty Cords [[id number]]  . potassium chloride 20 MEQ TBCR    Sig: Take 10 mEq by mouth daily. For 3 days Repeat for 3 more days if swelling persists    Dispense:  6 tablet    Refill:  0    Order Specific Question:   Supervising Provider    Answer:   Smitty Cords [[id number]]      Follow up plan: Return if symptoms worsen or fail to improve.   [name], DNP, AGPCNP-BC Adult Gerontology Primary Care Nurse Practitioner [facility] 09/11/2016, 10:07 PM

## 2016-09-10 NOTE — Patient Instructions (Addendum)
Alyse, Thank you for coming in to clinic today.  1. For your swelling: - Take furosemide 20 mg once daily for 3 days - Take potassium chloride 20 meq once daily for 3 days while you are on furosemide. - I recommend compression socks for both legs.  You can buy these at [facility]  2. Call us if your swelling worsens. - Recommend daily weights.  If you gain more than 2 pounds in 1 day or 5 pounds in 7 days, call this clinic.  Please schedule a follow-up appointment with [name], AGNP. Return if symptoms worsen or fail to improve.  If you have any other questions or concerns, please feel free to call the clinic or send a message through MyChart. You may also schedule an earlier appointment if necessary.  You will receive a survey after today's visit either digitally by e-mail or paper by [employer]. Your experiences and feedback matter to [place].  Please respond so we know how we are doing as we provide care for you.   [name], DNP, AGNP-BC Adult Gerontology Nurse Practitioner [facility], [facility]

## 2016-09-11 DIAGNOSIS — I5030 Unspecified diastolic (congestive) heart failure: Secondary | ICD-10-CM | POA: Insufficient documentation

## 2016-09-11 NOTE — Progress Notes (Signed)
I have reviewed this encounter including the documentation in this note and/or discussed this patient with the provider, [name], AGPCNP-BC. I am certifying that I agree with the content of this note as supervising physician.  Saralyn Pilar, DO [facility] 09/11/2016, 10:43 PM

## 2016-09-11 NOTE — Assessment & Plan Note (Signed)
Pt w/ stable respiratory status, worsening edema over last 5 days.  Plan: 1. Encouraged daily weights.  Purchase scale for home use. 2. Start furosemide 20 mg once daily x 3 days,  Repeat x 3 more days if symptoms persist. 3.. Take potassium chloride 20 meq once daily x 3 days and repeat if continued swelling and taking furosemide. 4. Consider cardiology referral if repeat symptoms. 5. Encouraged low salt diet. 6. Consider BNP check w/ next labs. 7. Follow up as needed and at next scheduled visit in September.

## 2016-09-26 ENCOUNTER — Other Ambulatory Visit: Payer: Self-pay | Admitting: Nurse Practitioner

## 2016-09-26 DIAGNOSIS — R609 Edema, unspecified: Secondary | ICD-10-CM

## 2016-09-26 MED ORDER — POTASSIUM CHLORIDE ER 20 MEQ PO TBCR: 10.0000 meq | EXTENDED_RELEASE_TABLET | Freq: Every day | ORAL | 0 refills | Status: DC

## 2016-09-26 MED ORDER — FUROSEMIDE 20 MG PO TABS: 20.0000 mg | ORAL_TABLET | Freq: Every day | ORAL | 0 refills | Status: DC

## 2016-09-26 NOTE — Telephone Encounter (Signed)
Order placed.  Will need to take both furosemide and potassium once daily.  If recurrent problem, may need to have additional cardiac workup performed.

## 2016-09-26 NOTE — Telephone Encounter (Signed)
The pt daughter was notified.  

## 2016-09-26 NOTE — Telephone Encounter (Signed)
Daughter called states that  Pt.  Legs and ankle was swollen again wanted to know if you could call in some flood pills.  Patsy call back # is [phone]

## 2016-10-19 ENCOUNTER — Other Ambulatory Visit: Payer: Self-pay | Admitting: Nurse Practitioner

## 2016-10-19 NOTE — Telephone Encounter (Signed)
NCCSRS reviewed.  Pt only receiving lorazepam and controlled substances from me.  Others were previously disclosed.  Will continue lorazepam for another 3 months.  Would like to transition to another medication in future.

## 2016-10-22 ENCOUNTER — Encounter: Payer: Self-pay | Admitting: Nurse Practitioner

## 2016-10-22 ENCOUNTER — Ambulatory Visit (INDEPENDENT_AMBULATORY_CARE_PROVIDER_SITE_OTHER): Payer: Medicare HMO | Admitting: Nurse Practitioner

## 2016-10-22 VITALS — BP 149/63 | HR 66 | Temp 98.1°F | Ht 60.0 in | Wt 152.6 lb

## 2016-10-22 DIAGNOSIS — M62830 Muscle spasm of back: Secondary | ICD-10-CM | POA: Insufficient documentation

## 2016-10-22 DIAGNOSIS — R609 Edema, unspecified: Secondary | ICD-10-CM

## 2016-10-22 DIAGNOSIS — I5030 Unspecified diastolic (congestive) heart failure: Secondary | ICD-10-CM | POA: Diagnosis not present

## 2016-10-22 DIAGNOSIS — J449 Chronic obstructive pulmonary disease, unspecified: Secondary | ICD-10-CM

## 2016-10-22 DIAGNOSIS — I10 Essential (primary) hypertension: Secondary | ICD-10-CM

## 2016-10-22 MED ORDER — FLUTICASONE-SALMETEROL 100-50 MCG/DOSE IN AEPB: 1.0000 | INHALATION_SPRAY | Freq: Two times a day (BID) | RESPIRATORY_TRACT | 3 refills | Status: DC

## 2016-10-22 MED ORDER — POTASSIUM CHLORIDE ER 10 MEQ PO TBCR
10.0000 meq | EXTENDED_RELEASE_TABLET | Freq: Every day | ORAL | 1 refills | Status: DC
Start: 2016-10-22 — End: 2017-04-11

## 2016-10-22 MED ORDER — FUROSEMIDE 20 MG PO TABS: 10.0000 mg | ORAL_TABLET | Freq: Every day | ORAL | 5 refills | Status: DC

## 2016-10-22 MED ORDER — DOCUSATE SODIUM 100 MG PO CAPS: ORAL_CAPSULE | ORAL | 0 refills | Status: DC

## 2016-10-22 MED ORDER — ALBUTEROL SULFATE HFA 108 (90 BASE) MCG/ACT IN AERS: 2.0000 | INHALATION_SPRAY | Freq: Four times a day (QID) | RESPIRATORY_TRACT | 5 refills | Status: DC | PRN

## 2016-10-22 MED ORDER — BACLOFEN 10 MG PO TABS: 10.0000 mg | ORAL_TABLET | Freq: Three times a day (TID) | ORAL | 0 refills | Status: DC

## 2016-10-22 NOTE — Assessment & Plan Note (Signed)
Pt w/ stable respiratory status, ongoing difficulty w/ worsening edema.  Pt refuses to wear compression socks.  Not checking daily weight.  Plan: 1. Encouraged daily weights.  Purchase scale for home use. 2. Start furosemide 10 mg once daily and continue. 3.. Take potassium chloride 10 meq once daily. 4. Consider cardiology referral in future. 5. Encouraged low salt diet. 6. Will need BNP w/ next labs 7. Follow up 3 months.

## 2016-10-22 NOTE — Assessment & Plan Note (Addendum)
Stable.  Clear lungs today.  Pt taking medications correctly after education at last visit.  Pt only uses albuterol inhaler once daily and Advair 1 puff 2x daily.  Plan: 1. CONTINUE advair 1 puff bid. 2. Continue albuterol neb prn.  Reviewed use of albuterol inhaler as rescue only. 3. Monitor symptoms and follow up 3 months.

## 2016-10-22 NOTE — Assessment & Plan Note (Addendum)
Controlled at home and lower at night than in early am.  Elevated in clinic today.  Pt BP goal < 140/90.  Taking carvedilol only once daily.  Pt w/ lower leg swelling previously responsive to furosemide.  Plan: 1. Continue carvedilol 3.125 mg once daily  - Should likely be dosed bid however will wait for reassessment of BP since adding furosemide daily.  Want to avoid hypotension. 2. Reinforced DASH diet and physical activity. 3. Follow up 3 months.

## 2016-10-22 NOTE — Patient Instructions (Addendum)
Sandy Daniel, Thank you for coming in to clinic today.  1. For your pain:  - START taking baclofen 10 mg three times per day as needed for muscle spasms.  2. For your blood pressure:  - Continue taking medications without changes.  3. For your leg swelling: - Take furosemide 10 mg once daily (1/2 tablet) every morning. - Take 10 meq potassium chloride once daily with furosemide.  4. For your sleep: - Continue quetiapine 50 mg once at bedtime.  If pain is controlled and still has trouble sleeping, we can consider increasing the dose of this medication.  Please call clinic.  Please schedule a follow-up appointment with [name], AGNP. Return in about 3 months (around 01/21/2017) for hypertension, insomnia.  If you have any other questions or concerns, please feel free to call the clinic or send a message through MyChart. You may also schedule an earlier appointment if necessary.  You will receive a survey after today's visit either digitally by e-mail or paper by [employer]. Your experiences and feedback matter to [place].  Please respond so we know how we are doing as we provide care for you.   [name], DNP, AGNP-BC Adult Gerontology Nurse Practitioner [facility], [facility]

## 2016-10-22 NOTE — Progress Notes (Signed)
Subjective:    Patient ID: Sandy Daniel, female    DOB: Jul 12, 1928, 83 y.o.   MRN: [id number]  Sandy Daniel is a 83 y.o. female presenting on 10/22/2016 for Hypertension (f/u medications)  Pt is accompanied today by a family friend.  Her daughter-in-law is usually with her and is the person who manages her medications.  HPI Hypertension - She is checking BP at home or outside of clinic.  Readings 111-146 SBP - Pt reported. - Current medications: carvedilol 3.125 mg daily, furosemide 20 mg once daily x 3 days prn leg swelling, tolerating well without side effects - She is symptomatic with headaches, leg swelling - Pt denies lightheadedness, dizziness, changes in vision, chest tightness/pressure, palpitations, sudden loss of speech or loss of consciousness. - She  reports no regular exercise routine. - Her diet is moderate in salt, moderate in fat, and moderate in carbohydrates.  Insomnia "Up and down all night." Pt reports to use the bathroom, friend who has been staying with her reports she doesn't seem to be able to sleep.      Left Wrist Fracture Pt has finished follow up w/ orthopedics.  Instructed to stop wearing wrist brace 24 hours per day and now only as needed.  Pt notes significantly reduced hand strength and some ongoing pain.  Has been doing exercises for making a fist.  No OT or PT after injury.  Weight loss/Possible Failure to thrive Pt has continued losing weight, but less than previously.  Pt reports good appetite.    Social History  Substance Use Topics  . Smoking status: Current Every Day Smoker    Packs/day: 1.00    Years: 50.00    Types: Cigarettes  . Smokeless tobacco: Never Used  . Alcohol use No    Review of Systems Per HPI unless specifically indicated above     Objective:    BP (!) 149/63 (BP Location: Right Arm, Patient Position: Sitting, Cuff Size: Normal)   Pulse 66   Temp 98.1 F (36.7 C) (Oral)   Ht 5' (1.524 m)   Wt 152 lb 9.6 oz  (69.2 kg)   SpO2 98%   BMI 29.80 kg/m    Wt Readings from Last 3 Encounters:  10/22/16 152 lb 9.6 oz (69.2 kg)  09/10/16 158 lb 3.2 oz (71.8 kg)  08/21/16 156 lb 12.8 oz (71.1 kg)    Physical Exam  Constitutional: She is oriented to person, place, and time. She appears well-developed and well-nourished. No distress.  HENT:  Head: Normocephalic and atraumatic.  Cardiovascular: Normal rate, regular rhythm and intact distal pulses.   Pulmonary/Chest: Effort normal and breath sounds normal. No respiratory distress. She has no wheezes. She has no rales.  Abdominal: Soft. Bowel sounds are normal. She exhibits no distension. There is no tenderness.  Musculoskeletal:  Neck, Shoulders, Low Back Kyphosis, present.  Forward protrusion of head and neck in flexion to keep head upright.  Paraspinal muscles of neck and lumbar spine in hypertonic state.  Very tender to palpation w/ trigger points of trapezius and muscles of lumbar spine. Limited ROM in all directions.    Neurological: She is alert and oriented to person, place, and time. Coordination and gait abnormal.  Romberg positive Equine gait.  Pt walking w/ single prong cane, weaving side to side.    Skin: Skin is warm and dry.  Psychiatric: She has a normal mood and affect. Her behavior is normal. Judgment and thought content normal.  Results for orders placed or performed during the hospital encounter of 07/11/16  Blood culture (routine x 2)  Result Value Ref Range   Specimen Description BLOOD   RIGHT AC    Special Requests BOTTLES DRAWN AEROBIC AND ANAEROBIC  BCAV    Culture NO GROWTH 5 DAYS    Report Status 07/16/2016 FINAL   Blood culture (routine x 2)  Result Value Ref Range   Specimen Description BLOOD  RIGHT AC    Special Requests BOTTLES DRAWN AEROBIC AND ANAEROBIC  BCAV    Culture NO GROWTH 5 DAYS    Report Status 07/16/2016 FINAL   CBC with Differential  Result Value Ref Range   WBC 5.6 3.6 - 11.0 K/uL   RBC  4.26 3.80 - 5.20 MIL/uL   Hemoglobin 12.7 12.0 - 16.0 g/dL   HCT 75.4 49.2 - 01.0 %   MCV 89.4 80.0 - 100.0 fL   MCH 29.7 26.0 - 34.0 pg   MCHC 33.2 32.0 - 36.0 g/dL   RDW 07.1 (H) 21.9 - 75.8 %   Platelets 270 150 - 440 K/uL   Neutrophils Relative % 63 %   Neutro Abs 3.6 1.4 - 6.5 K/uL   Lymphocytes Relative 24 %   Lymphs Abs 1.4 1.0 - 3.6 K/uL   Monocytes Relative 11 %   Monocytes Absolute 0.6 0.2 - 0.9 K/uL   Eosinophils Relative 1 %   Eosinophils Absolute 0.1 0 - 0.7 K/uL   Basophils Relative 1 %   Basophils Absolute 0.0 0 - 0.1 K/uL  Comprehensive metabolic panel  Result Value Ref Range   Sodium 141 135 - 145 mmol/L   Potassium 4.2 3.5 - 5.1 mmol/L   Chloride 107 101 - 111 mmol/L   CO2 26 22 - 32 mmol/L   Glucose, Bld 98 65 - 99 mg/dL   BUN 24 (H) 6 - 20 mg/dL   Creatinine, Ser 8.32 0.44 - 1.00 mg/dL   Calcium 9.2 8.9 - 54.9 mg/dL   Total Protein 7.8 6.5 - 8.1 g/dL   Albumin 3.7 3.5 - 5.0 g/dL   AST 24 15 - 41 U/L   ALT 18 14 - 54 U/L   Alkaline Phosphatase 175 (H) 38 - 126 U/L   Total Bilirubin 0.6 0.3 - 1.2 mg/dL   GFR calc non Af Amer >60 >60 mL/min   GFR calc Af Amer >60 >60 mL/min   Anion gap 8 5 - 15  Troponin I  Result Value Ref Range   Troponin I <0.03 <0.03 ng/mL  Brain natriuretic peptide  Result Value Ref Range   B Natriuretic Peptide 105.0 (H) 0.0 - 100.0 pg/mL  Magnesium  Result Value Ref Range   Magnesium 2.0 1.7 - 2.4 mg/dL  Fibrin derivatives D-Dimer ([facility] only)  Result Value Ref Range   Fibrin derivatives D-dimer ([facility]) 2,359.92 (H) 0.00 - 499.00  Basic metabolic panel  Result Value Ref Range   Sodium 141 135 - 145 mmol/L   Potassium 3.7 3.5 - 5.1 mmol/L   Chloride 107 101 - 111 mmol/L   CO2 28 22 - 32 mmol/L   Glucose, Bld 76 65 - 99 mg/dL   BUN 22 (H) 6 - 20 mg/dL   Creatinine, Ser 8.26 0.44 - 1.00 mg/dL   Calcium 8.6 (L) 8.9 - 10.3 mg/dL   GFR calc non Af Amer >60 >60 mL/min   GFR calc Af Amer >60 >60 mL/min   Anion gap 6 5 - 15  CBC  Result Value Ref Range   WBC 5.8 3.6 - 11.0 K/uL   RBC 3.73 (L) 3.80 - 5.20 MIL/uL   Hemoglobin 11.5 (L) 12.0 - 16.0 g/dL   HCT 16.1 (L) 09.6 - 04.5 %   MCV 90.3 80.0 - 100.0 fL   MCH 30.9 26.0 - 34.0 pg   MCHC 34.2 32.0 - 36.0 g/dL   RDW 40.9 (H) 81.1 - 91.4 %   Platelets 226 150 - 440 K/uL  ECHOCARDIOGRAM COMPLETE  Result Value Ref Range   Weight 2,560 oz   Height 59 in   BP 153/69 mmHg      Assessment & Plan:   Problem List Items Addressed This Visit      Cardiovascular and Mediastinum   HTN (hypertension) - Primary    Controlled at home and lower at night than in early am.  Elevated in clinic today.  Pt BP goal < 140/90.  Taking carvedilol only once daily.  Pt w/ lower leg swelling previously responsive to furosemide.  Plan: 1. Continue carvedilol 3.125 mg once daily  - Should likely be dosed bid however will wait for reassessment of BP since adding furosemide daily.  Want to avoid hypotension. 2. Reinforced DASH diet and physical activity. 3. Follow up 3 months.      Relevant Medications   furosemide (LASIX) 20 MG tablet   potassium chloride (K-DUR) 10 MEQ tablet   (HFpEF) heart failure with preserved ejection fraction (HCC)    Pt w/ stable respiratory status, ongoing difficulty w/ worsening edema.  Pt refuses to wear compression socks.  Not checking daily weight.  Plan: 1. Encouraged daily weights.  Purchase scale for home use. 2. Start furosemide 10 mg once daily and continue. 3.. Take potassium chloride 10 meq once daily. 4. Consider cardiology referral in future. 5. Encouraged low salt diet. 6. Will need BNP w/ next labs 7. Follow up 3 months.      Relevant Medications   furosemide (LASIX) 20 MG tablet     Respiratory   COPD (chronic obstructive pulmonary disease) (HCC)    Stable.  Clear lungs today.  Pt taking medications correctly after education at last visit.  Pt only uses albuterol inhaler once daily and Advair 1 puff 2x daily.  Plan: 1.  CONTINUE advair 1 puff bid. 2. Continue albuterol neb prn.  Reviewed use of albuterol inhaler as rescue only. 3. Monitor symptoms and follow up 3 months.      Relevant Medications   albuterol (PROVENTIL HFA;VENTOLIN HFA) 108 (90 Base) MCG/ACT inhaler   Fluticasone-Salmeterol (ADVAIR) 100-50 MCG/DOSE AEPB     Other   Muscle spasm of back    Acute spasm noted in neck, shoulders, low back tender to palpation.  Pt continues to complain of chronic pain severe that she sometimes wants to cry.  Plan: 1. Avoid opioids. 2. START baclofen 10 mg tid prn spasm. 3. CONSIDER increasing gabapentin to 300 mg tid. CONTINUE 200 mg tid for now. 4. Follow up as needed and in 3 months.      Relevant Medications   baclofen (LIORESAL) 10 MG tablet    Other Visit Diagnoses    Peripheral edema       Acutely worsening pedal and left arm edema.  Plan: see HFpEF above   Relevant Medications   furosemide (LASIX) 20 MG tablet   potassium chloride (K-DUR) 10 MEQ tablet      Meds ordered this encounter  Medications  . DISCONTD: KLOR-CON M20 20 MEQ  tablet  . furosemide (LASIX) 20 MG tablet    Sig: Take 0.5 tablets (10 mg total) by mouth daily.    Dispense:  45 tablet    Refill:  5  . potassium chloride (K-DUR) 10 MEQ tablet    Sig: Take 1 tablet (10 mEq total) by mouth daily.    Dispense:  90 tablet    Refill:  1  . albuterol (PROVENTIL HFA;VENTOLIN HFA) 108 (90 Base) MCG/ACT inhaler    Sig: Inhale 2 puffs into the lungs every 6 (six) hours as needed for wheezing or shortness of breath.    Dispense:  1 Inhaler    Refill:  5  . baclofen (LIORESAL) 10 MG tablet    Sig: Take 1 tablet (10 mg total) by mouth 3 (three) times daily.    Dispense:  90 each    Refill:  0  . Fluticasone-Salmeterol (ADVAIR) 100-50 MCG/DOSE AEPB    Sig: Inhale 1 puff into the lungs 2 (two) times daily.    Dispense:  1 each    Refill:  3  . docusate sodium (COLACE) 100 MG capsule    Sig: Take 1 tablet once or twice daily  as needed for constipation    Dispense:  30 capsule    Refill:  0      Follow up plan: Return in about 3 months (around 01/21/2017) for hypertension, insomnia.  A total of 40 minutes was spent face-to-face with this patient. Greater than 50% of this time was spent in counseling and coordination of care with the patient for chronic conditions per A/P above.  Pt requires significant education about her health conditions.  [name], DNP, AGPCNP-BC Adult Gerontology Primary Care Nurse Practitioner [facility] 10/22/2016, 11:22 AM

## 2016-10-22 NOTE — Assessment & Plan Note (Signed)
Acute spasm noted in neck, shoulders, low back tender to palpation.  Pt continues to complain of chronic pain severe that she sometimes wants to cry.  Plan: 1. Avoid opioids. 2. START baclofen 10 mg tid prn spasm. 3. CONSIDER increasing gabapentin to 300 mg tid. CONTINUE 200 mg tid for now. 4. Follow up as needed and in 3 months.

## 2016-11-27 ENCOUNTER — Other Ambulatory Visit: Payer: Self-pay | Admitting: Nurse Practitioner

## 2016-11-27 DIAGNOSIS — R609 Edema, unspecified: Secondary | ICD-10-CM

## 2016-12-04 ENCOUNTER — Ambulatory Visit (INDEPENDENT_AMBULATORY_CARE_PROVIDER_SITE_OTHER): Payer: Medicare HMO | Admitting: Nurse Practitioner

## 2016-12-04 ENCOUNTER — Encounter: Payer: Self-pay | Admitting: Nurse Practitioner

## 2016-12-04 VITALS — BP 120/60 | HR 61 | Temp 97.6°F | Ht 60.0 in | Wt 146.4 lb

## 2016-12-04 DIAGNOSIS — M546 Pain in thoracic spine: Secondary | ICD-10-CM | POA: Diagnosis not present

## 2016-12-04 DIAGNOSIS — M545 Low back pain, unspecified: Secondary | ICD-10-CM

## 2016-12-04 DIAGNOSIS — Z23 Encounter for immunization: Secondary | ICD-10-CM

## 2016-12-04 DIAGNOSIS — G8929 Other chronic pain: Secondary | ICD-10-CM | POA: Insufficient documentation

## 2016-12-04 DIAGNOSIS — M62830 Muscle spasm of back: Secondary | ICD-10-CM | POA: Diagnosis not present

## 2016-12-04 MED ORDER — GABAPENTIN 300 MG PO CAPS: 300.0000 mg | ORAL_CAPSULE | Freq: Three times a day (TID) | ORAL | 5 refills | Status: DC | PRN

## 2016-12-04 MED ORDER — BACLOFEN 20 MG PO TABS: 20.0000 mg | ORAL_TABLET | Freq: Two times a day (BID) | ORAL | 5 refills | Status: DC | PRN

## 2016-12-04 NOTE — Patient Instructions (Addendum)
Ms. Boniface, Thank you for coming in to clinic today.  1. For your pain: - Increase gabapentin to total daily dose of 900 mg.  Take one tablet three times daily as needed OR two tablets at bedtime with one tablet during the day.  - START taking baclofen 10 mg tablet. Take 2 tablets twice daily as needed for muscle spasm. - If not helping in the next 24-48 hours, we can try a different muscle relaxer.   Please schedule a follow-up appointment with [name], AGNP. Return 5-7 days if symptoms worsen or fail to improve AND as scheduled for regular followup.  If you have any other questions or concerns, please feel free to call the clinic or send a message through MyChart. You may also schedule an earlier appointment if necessary.  You will receive a survey after today's visit either digitally by e-mail or paper by [employer]. Your experiences and feedback matter to [place].  Please respond so we know how we are doing as we provide care for you.   [name], DNP, AGNP-BC Adult Gerontology Nurse Practitioner [facility], [facility]

## 2016-12-04 NOTE — Progress Notes (Signed)
Subjective:    Patient ID: Sandy Daniel, female    DOB: 16-Jun-1928, 83 y.o.   MRN: [id number]  Sandy Daniel is a 83 y.o. female presenting on 12/04/2016 for Back Pain (left side back pain that radiates up to the neck )   HPI Chronic Pain Pt has chronic back pain and thoracic back pain w/ radiation up to neck.  Has had worsening pain over last 2 days c/w spasm.  No new injury or immediate change in pain.  Pt has long term back and neck pain and has previously been managed with chronic opioids.  She is currently taking baclofen, acetaminophen, lidocaine patches.  No relief from current medications.  Has had repeated visits in past w/ explanation of no chronic opioids from [facility] and daughter-in-law verbalizes understanding. Pt visibly and verbally expresses anger when Provider was describing policy for clinic.   Social History  Substance Use Topics  . Smoking status: Current Every Day Smoker    Packs/day: 1.00    Years: 50.00    Types: Cigarettes  . Smokeless tobacco: Never Used  . Alcohol use No    Review of Systems Per HPI unless specifically indicated above     Objective:    BP 120/60 (BP Location: Left Arm, Patient Position: Sitting, Cuff Size: Normal)   Pulse 61   Temp 97.6 F (36.4 C) (Oral)   Ht 5' (1.524 m)   Wt 146 lb 6.4 oz (66.4 kg)   SpO2 98%   BMI 28.59 kg/m   Wt Readings from Last 3 Encounters:  12/04/16 146 lb 6.4 oz (66.4 kg)  10/22/16 152 lb 9.6 oz (69.2 kg)  09/10/16 158 lb 3.2 oz (71.8 kg)    Physical Exam  General - frail older adult, well-appearing, NAD HEENT - Normocephalic, atraumatic Neck - supple, non-tender, no LAD Heart - RRR, no murmurs heard Lungs - Clear throughout all lobes, no wheezing, crackles, or rhonchi. Normal work of breathing. Musculoskeletal -  Thoracic and Lumbar Back Inspection: Normal appearance, Kyphosis,  Right shoulder higher than left, forward head protrusion. Palpation: No tenderness over spinous  processes.  Bilateral paraspinal muscles tender and without hypertonicity/spasm. ROM: Severely limited AROM forward flex / back extension, rotation L/R with discomfort Special Testing: Seated SLR negative for radicular pain bilaterally, but with reproduced midline back pain Strength: Bilateral hip flex/ext 5/5, knee flex/ext 5/5, ankle dorsiflex/plantarflex 5/5 Neurovascular: intact distal sensation to light touch  Extremeties - non-tender, trace pedal edema, cap refill < 2 seconds, peripheral pulses intact +2 bilaterally Skin - warm, dry Neuro - awake, alert, oriented x3, normal gait Psych - Normal mood and affect, normal behavior    Results for orders placed or performed during the hospital encounter of 07/11/16  Blood culture (routine x 2)  Result Value Ref Range   Specimen Description BLOOD   RIGHT AC    Special Requests BOTTLES DRAWN AEROBIC AND ANAEROBIC  BCAV    Culture NO GROWTH 5 DAYS    Report Status 07/16/2016 FINAL   Blood culture (routine x 2)  Result Value Ref Range   Specimen Description BLOOD  RIGHT AC    Special Requests BOTTLES DRAWN AEROBIC AND ANAEROBIC  BCAV    Culture NO GROWTH 5 DAYS    Report Status 07/16/2016 FINAL   CBC with Differential  Result Value Ref Range   WBC 5.6 3.6 - 11.0 K/uL   RBC 4.26 3.80 - 5.20 MIL/uL   Hemoglobin 12.7 12.0 - 16.0 g/dL   HCT  38.1 35.0 - 47.0 %   MCV 89.4 80.0 - 100.0 fL   MCH 29.7 26.0 - 34.0 pg   MCHC 33.2 32.0 - 36.0 g/dL   RDW 40.916.7 (H) 81.111.5 - 91.414.5 %   Platelets 270 150 - 440 K/uL   Neutrophils Relative % 63 %   Neutro Abs 3.6 1.4 - 6.5 K/uL   Lymphocytes Relative 24 %   Lymphs Abs 1.4 1.0 - 3.6 K/uL   Monocytes Relative 11 %   Monocytes Absolute 0.6 0.2 - 0.9 K/uL   Eosinophils Relative 1 %   Eosinophils Absolute 0.1 0 - 0.7 K/uL   Basophils Relative 1 %   Basophils Absolute 0.0 0 - 0.1 K/uL  Comprehensive metabolic panel  Result Value Ref Range   Sodium 141 135 - 145 mmol/L   Potassium 4.2 3.5 - 5.1 mmol/L     Chloride 107 101 - 111 mmol/L   CO2 26 22 - 32 mmol/L   Glucose, Bld 98 65 - 99 mg/dL   BUN 24 (H) 6 - 20 mg/dL   Creatinine, Ser 7.820.76 0.44 - 1.00 mg/dL   Calcium 9.2 8.9 - 95.610.3 mg/dL   Total Protein 7.8 6.5 - 8.1 g/dL   Albumin 3.7 3.5 - 5.0 g/dL   AST 24 15 - 41 U/L   ALT 18 14 - 54 U/L   Alkaline Phosphatase 175 (H) 38 - 126 U/L   Total Bilirubin 0.6 0.3 - 1.2 mg/dL   GFR calc non Af Amer >60 >60 mL/min   GFR calc Af Amer >60 >60 mL/min   Anion gap 8 5 - 15  Troponin I  Result Value Ref Range   Troponin I <0.03 <0.03 ng/mL  Brain natriuretic peptide  Result Value Ref Range   B Natriuretic Peptide 105.0 (H) 0.0 - 100.0 pg/mL  Magnesium  Result Value Ref Range   Magnesium 2.0 1.7 - 2.4 mg/dL  Fibrin derivatives D-Dimer ([facility] only)  Result Value Ref Range   Fibrin derivatives D-dimer ([facility]) 2,359.92 (H) 0.00 - 499.00  Basic metabolic panel  Result Value Ref Range   Sodium 141 135 - 145 mmol/L   Potassium 3.7 3.5 - 5.1 mmol/L   Chloride 107 101 - 111 mmol/L   CO2 28 22 - 32 mmol/L   Glucose, Bld 76 65 - 99 mg/dL   BUN 22 (H) 6 - 20 mg/dL   Creatinine, Ser 2.130.85 0.44 - 1.00 mg/dL   Calcium 8.6 (L) 8.9 - 10.3 mg/dL   GFR calc non Af Amer >60 >60 mL/min   GFR calc Af Amer >60 >60 mL/min   Anion gap 6 5 - 15  CBC  Result Value Ref Range   WBC 5.8 3.6 - 11.0 K/uL   RBC 3.73 (L) 3.80 - 5.20 MIL/uL   Hemoglobin 11.5 (L) 12.0 - 16.0 g/dL   HCT 08.633.7 (L) 57.835.0 - 46.947.0 %   MCV 90.3 80.0 - 100.0 fL   MCH 30.9 26.0 - 34.0 pg   MCHC 34.2 32.0 - 36.0 g/dL   RDW 62.916.7 (H) 52.811.5 - 41.314.5 %   Platelets 226 150 - 440 K/uL  ECHOCARDIOGRAM COMPLETE  Result Value Ref Range   Weight 2,560 oz   Height 59 in   BP 153/69 mmHg      Assessment & Plan:   Problem List Items Addressed This Visit      Other   Chronic bilateral low back pain    Pt w/ acute worsening off oxycodone.  Pt taking gabapentin 200 mg bid, acetaminophen, and baclofen 10 mg tid.  Plan: 1. Increase gabapentin to  300 mg tid. 2. PT was previously ordered, but refused by pt 3. Specifically defined no oxycodone for chronic use from this clinic.  Pt does not want to go to pain clinic, but daughter has agreed to take patient.  Will try for at least 3 visits to get pain better managed.  Discussed that pt may be asked to try interventional therapies as well. - Referral placed for [facility] Pain management. 4. Also increased baclofen to 20 mg bid instead of 10 mg tid. 5. Follow up as needed.      Relevant Medications   baclofen (LIORESAL) 20 MG tablet   Muscle spasm of back    Subacute to chronic spasm of neck, thoracic spine, and lumbar spine w/ acute worsening involving more paraspinal muscles than 1 month ago.  Pt notes severe pain that prevents activities. In visible distress and responding w/ anger that is redirectable.  See Plan for chronic low back pain.      Relevant Medications   baclofen (LIORESAL) 20 MG tablet   Chronic bilateral thoracic back pain    See AP low back pain.      Relevant Medications   baclofen (LIORESAL) 20 MG tablet   gabapentin (NEURONTIN) 300 MG capsule   Other Relevant Orders   Ambulatory referral to Pain Clinic    Other Visit Diagnoses    Need for immunization against influenza    -  Primary   Pt desires immunization.  Age>65 years. Administer high dose fluzone today.   Relevant Orders   Flu vaccine HIGH DOSE PF (Fluzone High dose) (Completed)      Meds ordered this encounter  Medications  . baclofen (LIORESAL) 20 MG tablet    Sig: Take 1 tablet (20 mg total) by mouth 2 (two) times daily as needed for muscle spasms.    Dispense:  30 tablet    Refill:  5    Order Specific Question:   Supervising Provider    Answer:   Smitty Cords [[id number]]  . gabapentin (NEURONTIN) 300 MG capsule    Sig: Take 1 capsule (300 mg total) by mouth 3 (three) times daily as needed.    Dispense:  90 capsule    Refill:  5    Order Specific Question:   Supervising Provider     Answer:   Smitty Cords [[id number]]     Follow up plan: Return 5-7 days if symptoms worsen or fail to improve AND as scheduled for regular followup.   A total of 25 minutes was spent face-to-face with this patient. Greater than 50% of this time was spent in counseling and coordination of care with the patient for pain management.   [name], DNP, AGPCNP-BC Adult Gerontology Primary Care Nurse Practitioner [facility] 12/11/2016, 10:40 PM

## 2016-12-11 NOTE — Assessment & Plan Note (Signed)
See AP low back pain.

## 2016-12-11 NOTE — Assessment & Plan Note (Signed)
Subacute to chronic spasm of neck, thoracic spine, and lumbar spine w/ acute worsening involving more paraspinal muscles than 1 month ago.  Pt notes severe pain that prevents activities. In visible distress and responding w/ anger that is redirectable.  See Plan for chronic low back pain.

## 2016-12-11 NOTE — Assessment & Plan Note (Signed)
Pt w/ acute worsening off oxycodone.  Pt taking gabapentin 200 mg bid, acetaminophen, and baclofen 10 mg tid.  Plan: 1. Increase gabapentin to 300 mg tid. 2. PT was previously ordered, but refused by pt 3. Specifically defined no oxycodone for chronic use from this clinic.  Pt does not want to go to pain clinic, but daughter has agreed to take patient.  Will try for at least 3 visits to get pain better managed.  Discussed that pt may be asked to try interventional therapies as well. - Referral placed for [facility] Pain management. 4. Also increased baclofen to 20 mg bid instead of 10 mg tid. 5. Follow up as needed.

## 2016-12-13 ENCOUNTER — Ambulatory Visit: Payer: Medicare HMO | Admitting: Student in an Organized Health Care Education/Training Program

## 2016-12-15 ENCOUNTER — Emergency Department
Admission: EM | Admit: 2016-12-15 | Discharge: 2016-12-15 | Disposition: A | Discharge status: Home / Self Care | Payer: Medicare HMO | Attending: Emergency Medicine | Admitting: Emergency Medicine

## 2016-12-15 ENCOUNTER — Emergency Department: Payer: Medicare HMO

## 2016-12-15 ENCOUNTER — Encounter: Payer: Self-pay | Admitting: Emergency Medicine

## 2016-12-15 DIAGNOSIS — Z7982 Long term (current) use of aspirin: Secondary | ICD-10-CM | POA: Insufficient documentation

## 2016-12-15 DIAGNOSIS — I2510 Atherosclerotic heart disease of native coronary artery without angina pectoris: Secondary | ICD-10-CM | POA: Diagnosis not present

## 2016-12-15 DIAGNOSIS — I10 Essential (primary) hypertension: Secondary | ICD-10-CM | POA: Diagnosis not present

## 2016-12-15 DIAGNOSIS — Z951 Presence of aortocoronary bypass graft: Secondary | ICD-10-CM | POA: Insufficient documentation

## 2016-12-15 DIAGNOSIS — F17210 Nicotine dependence, cigarettes, uncomplicated: Secondary | ICD-10-CM | POA: Diagnosis not present

## 2016-12-15 DIAGNOSIS — M25562 Pain in left knee: Secondary | ICD-10-CM | POA: Insufficient documentation

## 2016-12-15 DIAGNOSIS — Z79899 Other long term (current) drug therapy: Secondary | ICD-10-CM | POA: Insufficient documentation

## 2016-12-15 DIAGNOSIS — J45909 Unspecified asthma, uncomplicated: Secondary | ICD-10-CM | POA: Diagnosis not present

## 2016-12-15 DIAGNOSIS — J449 Chronic obstructive pulmonary disease, unspecified: Secondary | ICD-10-CM | POA: Diagnosis not present

## 2016-12-15 DIAGNOSIS — I252 Old myocardial infarction: Secondary | ICD-10-CM | POA: Insufficient documentation

## 2016-12-15 DIAGNOSIS — G8929 Other chronic pain: Secondary | ICD-10-CM | POA: Diagnosis not present

## 2016-12-15 MED ORDER — OXYCODONE-ACETAMINOPHEN 5-325 MG PO TABS
1.0000 | ORAL_TABLET | Freq: Once | ORAL | Status: AC
  Administered 2016-12-15: 1 via ORAL
  Filled 2016-12-15: qty 1

## 2016-12-15 MED ORDER — OXYCODONE-ACETAMINOPHEN 5-325 MG PO TABS: 1.0000 | ORAL_TABLET | Freq: Four times a day (QID) | ORAL | 0 refills | Status: DC | PRN

## 2016-12-15 NOTE — ED Provider Notes (Signed)
[facility] Emergency Department Provider Note    ____________________________________________   I have reviewed the triage vital signs and the nursing notes.   HISTORY  Chief Complaint Leg Pain   History limited by: Not Limited   HPI Sandy Daniel is a 83 y.o. female who presents to the emergency department today because of concerns for left knee pain   LOCATION:left knee DURATION:chronic TIMING: worse today SEVERITY: severe CONTEXT: patient states she has chronic left knee pain, has had injections in the past. Has been diagnosed with inflammatory arthritis. Denies any recent trauma. MODIFYING FACTORS: worse with movement ASSOCIATED SYMPTOMS: left hip pain.   Per medical record review patient has a history of joint pain.  Past Medical History:  Diagnosis Date  . Arthritis   . Asthma   . B12 deficiency   . CAD (coronary artery disease)   . COPD (chronic obstructive pulmonary disease) (HCC)   . Depression   . Epilepsy (HCC)   . GERD (gastroesophageal reflux disease)   . Hyperlipidemia   . Hypertension   . Hypoxemia   . Macular degeneration (senile) of retina   . Macular degeneration of both eyes   . MI (myocardial infarction) (HCC) 09/2003, 05/2013  . Physiological tremor   . Presence of stent of CABG    x2   . Seizures [facility])     Patient Active Problem List   Diagnosis Date Noted  . Chronic bilateral thoracic back pain 12/04/2016  . Muscle spasm of back 10/22/2016  . (HFpEF) heart failure with preserved ejection fraction (HCC) 09/11/2016  . Anxiety 07/16/2016  . Chronic bilateral low back pain 07/16/2016  . Unstable gait 07/16/2016  . Controlled substance agreement signed 03/27/2016  . Dysphagia 03/26/2016  . Panic disorder 03/26/2016  . CAD (coronary artery disease)   . Seizure disorder (HCC)   . Macular degeneration of both eyes   . B12 deficiency   . COPD (chronic obstructive pulmonary disease) (HCC)   . HTN  (hypertension)   . Hyperlipidemia   . Arthritis   . Depression   . GERD (gastroesophageal reflux disease)   . Physiological tremor 02/18/2014    Past Surgical History:  Procedure Laterality Date  . ABDOMINAL HYSTERECTOMY    . APPENDECTOMY    . CARDIAC CATHETERIZATION  05/2013  . CARDIAC SURGERY    . CATARACT EXTRACTION  03/2016  . CHOLECYSTECTOMY  1962  . CORONARY ANGIOPLASTY WITH STENT PLACEMENT  Aug. 2005 and March 2015  . ROTATOR CUFF REPAIR Right   . VENTRAL HERNIA REPAIR  2002    Prior to Admission medications   Medication Sig Start Date End Date Taking? Authorizing Provider  acetaminophen (TYLENOL) 500 MG tablet Take 1 tablet (500 mg total) by mouth 2 (two) times daily as needed for moderate pain. 07/20/16   Galen Manila, NP  albuterol (PROVENTIL HFA;VENTOLIN HFA) 108 (90 Base) MCG/ACT inhaler Inhale 2 puffs into the lungs every 6 (six) hours as needed for wheezing or shortness of breath. 10/22/16   Galen Manila, NP  albuterol (PROVENTIL) (2.5 MG/3ML) 0.083% nebulizer solution Take 3 mLs (2.5 mg total) by nebulization every 6 (six) hours as needed for Wheezing. 03/26/16   Olevia Perches P, DO  aspirin 81 MG tablet Take 1 tablet (81 mg total) by mouth daily. 07/16/16   Galen Manila, NP  atorvastatin (LIPITOR) 10 MG tablet Take 1 tablet (10 mg total) by mouth daily. 08/21/16   Galen Manila, NP  baclofen (LIORESAL) 20  MG tablet Take 1 tablet (20 mg total) by mouth 2 (two) times daily as needed for muscle spasms. 12/04/16   Galen ManilaKennedy, Lauren Renee, NP  carvedilol (COREG) 3.125 MG tablet Take 1 tablet (3.125 mg total) by mouth daily. 08/21/16   Galen ManilaKennedy, Lauren Renee, NP  docusate sodium (COLACE) 100 MG capsule Take 1 tablet once or twice daily as needed for constipation Patient not taking: Reported on 12/04/2016 10/22/16   Galen ManilaKennedy, Lauren Renee, NP  Fluticasone-Salmeterol (ADVAIR) 100-50 MCG/DOSE AEPB Inhale 1 puff into the lungs 2 (two) times daily. 10/22/16    Galen ManilaKennedy, Lauren Renee, NP  furosemide (LASIX) 20 MG tablet Take 0.5 tablets (10 mg total) by mouth daily. Patient not taking: Reported on 12/04/2016 10/22/16   Galen ManilaKennedy, Lauren Renee, NP  furosemide (LASIX) 20 MG tablet TAKE 1 TABLET BY MOUTH ONCE DAILY FOR 3 DAYS .  REPEAT  FOR  3  MORE  DAYS  IF  SWELLING  PERSISTS. Patient not taking: Reported on 12/04/2016 11/27/16   Galen ManilaKennedy, Lauren Renee, NP  gabapentin (NEURONTIN) 300 MG capsule Take 1 capsule (300 mg total) by mouth 3 (three) times daily as needed. 12/04/16   Galen ManilaKennedy, Lauren Renee, NP  lidocaine (LIDODERM) 5 % Place 1 patch onto the skin daily. Remove & Discard patch within 12 hours or as directed by MD 07/20/16   Galen ManilaKennedy, Lauren Renee, NP  LORazepam (ATIVAN) 0.5 MG tablet TAKE 1 TABLET BY MOUTH TWICE DAILY AS NEEDED FOR ANXIETY 10/19/16   Galen ManilaKennedy, Lauren Renee, NP  nitroGLYCERIN (NITROSTAT) 0.4 MG SL tablet Place 1 tablet (0.4 mg total) under the tongue every 5 (five) minutes as needed for chest pain. Patient not taking: Reported on 09/10/2016 03/26/16   Olevia PerchesJohnson, Megan P, DO  oxyCODONE-acetaminophen (ROXICET) 5-325 MG tablet Take 1 tablet by mouth every 6 (six) hours as needed for severe pain. 12/15/16   Phineas SemenGoodman, Clarisse Rodriges, MD  pantoprazole (PROTONIX) 40 MG tablet TAKE 1 TABLET BY MOUTH ONCE DAILY 10/19/16   Galen ManilaKennedy, Lauren Renee, NP  phenytoin (DILANTIN) 100 MG ER capsule Take 2 capsules (200 mg total) by mouth at bedtime. Take 2 by mouth at bedtime 08/21/16   Galen ManilaKennedy, Lauren Renee, NP  potassium chloride (K-DUR) 10 MEQ tablet Take 1 tablet (10 mEq total) by mouth daily. 10/22/16   Galen ManilaKennedy, Lauren Renee, NP  QUEtiapine (SEROQUEL) 50 MG tablet Take 1 tablet (50 mg total) by mouth at bedtime. 06/20/16   Galen ManilaKennedy, Lauren Renee, NP  sertraline (ZOLOFT) 100 MG tablet Take 1 tablet (100 mg total) by mouth daily. 08/21/16   Galen ManilaKennedy, Lauren Renee, NP    Allergies Motrin [ibuprofen]; Tolmetin; Alendronate; Cortisone; Other; and Tramadol hcl  Family History   Problem Relation Age of Onset  . Diabetes Mother   . Cancer Sister   . Diabetes Sister   . Heart disease Sister   . Heart attack Sister   . Hyperlipidemia Sister   . Hypertension Sister   . Cancer Brother   . Diabetes Brother   . Heart disease Brother   . Heart attack Brother   . Hyperlipidemia Brother   . Hypertension Brother   . Diabetes Brother   . Diabetes Brother   . Kidney failure Brother     Social History Social History  Substance Use Topics  . Smoking status: Current Every Day Smoker    Packs/day: 1.00    Years: 50.00    Types: Cigarettes  . Smokeless tobacco: Never Used  . Alcohol use No    Review of Systems  Constitutional: No fever/chills Eyes: No visual changes. ENT: No sore throat. Cardiovascular: Denies chest pain. Respiratory: Denies shortness of breath. Gastrointestinal: No abdominal pain.  No nausea, no vomiting.  No diarrhea.   Genitourinary: Negative for dysuria. Musculoskeletal: positive for left hip and knee pain. Skin: Negative for rash. Neurological: Negative for headaches, focal weakness or numbness.  ____________________________________________   PHYSICAL EXAM:  VITAL SIGNS: ED Triage Vitals  Enc Vitals Group     BP 12/15/16 1812 139/64     Pulse Rate 12/15/16 1812 67     Resp 12/15/16 1812 18     Temp 12/15/16 1812 98 F (36.7 C)     Temp Source 12/15/16 1812 Oral     SpO2 12/15/16 1812 93 %     Weight 12/15/16 1813 146 lb (66.2 kg)     Height 12/15/16 1813 5' (1.524 m)     Head Circumference --      Peak Flow --      Pain Score 12/15/16 1811 10   Constitutional: Alert and oriented. Well appearing and in no distress. Eyes: Conjunctivae are normal.  ENT   Head: Normocephalic and atraumatic.   Nose: No congestion/rhinnorhea.   Mouth/Throat: Mucous membranes are moist.   Neck: No stridor. Hematological/Lymphatic/Immunilogical: No cervical lymphadenopathy. Cardiovascular: Normal rate, regular rhythm.  No  murmurs, rubs, or gallops. Respiratory: Normal respiratory effort without tachypnea nor retractions. Breath sounds are clear and equal bilaterally. No wheezes/rales/rhonchi. Gastrointestinal: Soft and non tender. No rebound. No guarding.  Genitourinary: Deferred Musculoskeletal: left knee with small amount of effusion. Tender to manipulation. No warmth or erythema.  Neurologic:  Normal speech and language. No gross focal neurologic deficits are appreciated.  Skin:  Skin is warm, dry and intact. No rash noted. Psychiatric: Mood and affect are normal. Speech and behavior are normal. Patient exhibits appropriate insight and judgment.  ____________________________________________    LABS (pertinent positives/negatives)  None  ____________________________________________   EKG  None  ____________________________________________    RADIOLOGY  Left knee No acute fracture, some effusion Left hip No acute fracture ____________________________________________   PROCEDURES  Procedures  ____________________________________________   INITIAL IMPRESSION / ASSESSMENT AND PLAN / ED COURSE  Pertinent labs & imaging results that were available during my care of the patient were reviewed by me and considered in my medical decision making (see chart for details).  Patient presented to the emergency department today with concerns for left knee pain.  Patient was also complaining of left hip pain.  Differential would include fracture, arthritis, gout, septic arthritis, dislocation amongst other etiologies.  X-rays negative for acute fracture.  At this point I think inflammatory arthritis likely.  Patient did get some pain relief with narcotic pain medication.  Check [facility] controlled substance database currently no narcotic prescriptions.  Will give patient short dose.  Did discuss with patient importance of following up with orthopedic  surgery.   ____________________________________________   FINAL CLINICAL IMPRESSION(S) / ED DIAGNOSES  Final diagnoses:  Chronic pain of left knee     Note: This dictation was prepared with Dragon dictation. Any transcriptional errors that result from this process are unintentional     Phineas Semen, MD 12/15/16 2155

## 2016-12-15 NOTE — ED Triage Notes (Signed)
Pt awoke with left leg pain that is similar to her chronic arthritis but she states worse than normal. Pain from thigh to ankle. No redness, no swelling, no deformities.

## 2016-12-15 NOTE — Discharge Instructions (Signed)
Please seek medical attention for any high fevers, chest pain, shortness of breath, change in behavior, persistent vomiting, bloody stool or any other new or concerning symptoms.  

## 2016-12-19 ENCOUNTER — Ambulatory Visit
Payer: Medicare HMO | Attending: Student in an Organized Health Care Education/Training Program | Admitting: Student in an Organized Health Care Education/Training Program

## 2016-12-27 ENCOUNTER — Ambulatory Visit: Payer: Medicare HMO | Admitting: Student in an Organized Health Care Education/Training Program

## 2017-01-08 ENCOUNTER — Ambulatory Visit (INDEPENDENT_AMBULATORY_CARE_PROVIDER_SITE_OTHER): Payer: Medicare HMO

## 2017-01-08 VITALS — BP 160/72 | HR 78 | Temp 98.5°F | Resp 18 | Ht 60.0 in | Wt 148.6 lb

## 2017-01-08 DIAGNOSIS — Z0000 Encounter for general adult medical examination without abnormal findings: Secondary | ICD-10-CM | POA: Diagnosis not present

## 2017-01-08 NOTE — Patient Instructions (Addendum)
Sandy Daniel , Thank you for taking time to come for your Medicare Wellness Visit. I appreciate your ongoing commitment to your health goals. Please review the following plan we discussed and let me know if I can assist you in the future.   Screening recommendations/referrals: Colonoscopy: no longer required Mammogram: no longer required Bone Density: completed 02/17/2016 Recommended yearly ophthalmology/optometry visit for glaucoma screening and checkup Recommended yearly dental visit for hygiene and checkup  Vaccinations: Influenza vaccine: up to date Pneumococcal vaccine: up to date  Tdap vaccine: due, check with your insurance company for coverage Shingles vaccine: due, check with your insurance company for coverage  Advanced directives: Advance directive discussed with you today. I have provided a copy for you to complete at home and have notarized. Once this is complete please bring a copy in to our office so we can scan it into your chart.  Conditions/risks identified: Recommend drinking at least 6-8 glasses of water a day   Next appointment: Follow up on 01/21/2017 at 11:00 am with Elly Modena. Follow up in one year for your annual wellness exam.    Preventive Care 65 Years and Older, Female Preventive care refers to lifestyle choices and visits with your health care provider that can promote health and wellness. What does preventive care include?  A yearly physical exam. This is also called an annual well check.  Dental exams once or twice a year.  Routine eye exams. Ask your health care provider how often you should have your eyes checked.  Personal lifestyle choices, including:  Daily care of your teeth and gums.  Regular physical activity.  Eating a healthy diet.  Avoiding tobacco and drug use.  Limiting alcohol use.  Practicing safe sex.  Taking low-dose aspirin every day.  Taking vitamin and mineral supplements as recommended by your health care  provider. What happens during an annual well check? The services and screenings done by your health care provider during your annual well check will depend on your age, overall health, lifestyle risk factors, and family history of disease. Counseling  Your health care provider may ask you questions about your:  Alcohol use.  Tobacco use.  Drug use.  Emotional well-being.  Home and relationship well-being.  Sexual activity.  Eating habits.  History of falls.  Memory and ability to understand (cognition).  Work and work Astronomer.  Reproductive health. Screening  You may have the following tests or measurements:  Height, weight, and BMI.  Blood pressure.  Lipid and cholesterol levels. These may be checked every 5 years, or more frequently if you are over 62 years old.  Skin check.  Lung cancer screening. You may have this screening every year starting at age 85 if you have a 30-pack-year history of smoking and currently smoke or have quit within the past 15 years.  Fecal occult blood test (FOBT) of the stool. You may have this test every year starting at age 81.  Flexible sigmoidoscopy or colonoscopy. You may have a sigmoidoscopy every 5 years or a colonoscopy every 10 years starting at age 34.  Hepatitis C blood test.  Hepatitis B blood test.  Sexually transmitted disease (STD) testing.  Diabetes screening. This is done by checking your blood sugar (glucose) after you have not eaten for a while (fasting). You may have this done every 1-3 years.  Bone density scan. This is done to screen for osteoporosis. You may have this done starting at age 45.  Mammogram. This may be done every  1-2 years. Talk to your health care provider about how often you should have regular mammograms. Talk with your health care provider about your test results, treatment options, and if necessary, the need for more tests. Vaccines  Your health care provider may recommend certain  vaccines, such as:  Influenza vaccine. This is recommended every year.  Tetanus, diphtheria, and acellular pertussis (Tdap, Td) vaccine. You may need a Td booster every 10 years.  Zoster vaccine. You may need this after age 19.  Pneumococcal 13-valent conjugate (PCV13) vaccine. One dose is recommended after age 49.  Pneumococcal polysaccharide (PPSV23) vaccine. One dose is recommended after age 61. Talk to your health care provider about which screenings and vaccines you need and how often you need them. This information is not intended to replace advice given to you by your health care provider. Make sure you discuss any questions you have with your health care provider. Document Released: 02/25/2015 Document Revised: 10/19/2015 Document Reviewed: 11/30/2014 Elsevier Interactive Patient Education  2017 [place] Prevention in the Home Falls can cause injuries. They can happen to people of all ages. There are many things you can do to make your home safe and to help prevent falls. What can I do on the outside of my home?  Regularly fix the edges of walkways and driveways and fix any cracks.  Remove anything that might make you trip as you walk through a door, such as a raised step or threshold.  Trim any bushes or trees on the path to your home.  Use bright outdoor lighting.  Clear any walking paths of anything that might make someone trip, such as rocks or tools.  Regularly check to see if handrails are loose or broken. Make sure that both sides of any steps have handrails.  Any raised decks and porches should have guardrails on the edges.  Have any leaves, snow, or ice cleared regularly.  Use sand or salt on walking paths during winter.  Clean up any spills in your garage right away. This includes oil or grease spills. What can I do in the bathroom?  Use night lights.  Install grab bars by the toilet and in the tub and shower. Do not use towel bars as grab  bars.  Use non-skid mats or decals in the tub or shower.  If you need to sit down in the shower, use a plastic, non-slip stool.  Keep the floor dry. Clean up any water that spills on the floor as soon as it happens.  Remove soap buildup in the tub or shower regularly.  Attach bath mats securely with double-sided non-slip rug tape.  Do not have throw rugs and other things on the floor that can make you trip. What can I do in the bedroom?  Use night lights.  Make sure that you have a light by your bed that is easy to reach.  Do not use any sheets or blankets that are too big for your bed. They should not hang down onto the floor.  Have a firm chair that has side arms. You can use this for support while you get dressed.  Do not have throw rugs and other things on the floor that can make you trip. What can I do in the kitchen?  Clean up any spills right away.  Avoid walking on wet floors.  Keep items that you use a lot in easy-to-reach places.  If you need to reach something above you, use a strong  step stool that has a grab bar.  Keep electrical cords out of the way.  Do not use floor polish or wax that makes floors slippery. If you must use wax, use non-skid floor wax.  Do not have throw rugs and other things on the floor that can make you trip. What can I do with my stairs?  Do not leave any items on the stairs.  Make sure that there are handrails on both sides of the stairs and use them. Fix handrails that are broken or loose. Make sure that handrails are as long as the stairways.  Check any carpeting to make sure that it is firmly attached to the stairs. Fix any carpet that is loose or worn.  Avoid having throw rugs at the top or bottom of the stairs. If you do have throw rugs, attach them to the floor with carpet tape.  Make sure that you have a light switch at the top of the stairs and the bottom of the stairs. If you do not have them, ask someone to add them for  you. What else can I do to help prevent falls?  Wear shoes that:  Do not have high heels.  Have rubber bottoms.  Are comfortable and fit you well.  Are closed at the toe. Do not wear sandals.  If you use a stepladder:  Make sure that it is fully opened. Do not climb a closed stepladder.  Make sure that both sides of the stepladder are locked into place.  Ask someone to hold it for you, if possible.  Clearly mark and make sure that you can see:  Any grab bars or handrails.  First and last steps.  Where the edge of each step is.  Use tools that help you move around (mobility aids) if they are needed. These include:  Canes.  Walkers.  Scooters.  Crutches.  Turn on the lights when you go into a dark area. Replace any light bulbs as soon as they burn out.  Set up your furniture so you have a clear path. Avoid moving your furniture around.  If any of your floors are uneven, fix them.  If there are any pets around you, be aware of where they are.  Review your medicines with your doctor. Some medicines can make you feel dizzy. This can increase your chance of falling. Ask your doctor what other things that you can do to help prevent falls. This information is not intended to replace advice given to you by your health care provider. Make sure you discuss any questions you have with your health care provider. Document Released: 11/25/2008 Document Revised: 07/07/2015 Document Reviewed: 03/05/2014 Elsevier Interactive Patient Education  2017 [employer].

## 2017-01-08 NOTE — Progress Notes (Signed)
Subjective:   Sandy Daniel is a 83 y.o. female who presents for Medicare Annual (Subsequent) preventive examination.  Review of Systems:   Cardiac Risk Factors include: hypertension;advanced age (>3455men, 68>65 women);dyslipidemia     Objective:     Vitals: BP (!) 160/72 (BP Location: Left Arm, Patient Position: Sitting)   Pulse 78   Temp 98.5 F (36.9 C) (Oral)   Resp 18   Ht 5' (1.524 m)   Wt 148 lb 9.6 oz (67.4 kg)   BMI 29.02 kg/m   Body mass index is 29.02 kg/m.   Tobacco Social History   Tobacco Use  Smoking Status Current Every Day Smoker  . Packs/day: 0.25  . Years: 50.00  . Pack years: 12.50  . Types: Cigarettes  Smokeless Tobacco Never Used  Tobacco Comment   10 a day      Ready to quit: No Counseling given: Yes Comment: 10 a day    Past Medical History:  Diagnosis Date  . Arthritis   . Asthma   . B12 deficiency   . CAD (coronary artery disease)   . COPD (chronic obstructive pulmonary disease) (HCC)   . Depression   . Epilepsy (HCC)   . GERD (gastroesophageal reflux disease)   . Hyperlipidemia   . Hypertension   . Hypoxemia   . Macular degeneration (senile) of retina   . Macular degeneration of both eyes   . MI (myocardial infarction) (HCC) 09/2003, 05/2013  . Physiological tremor   . Presence of stent of CABG    x2   . Seizures (HCC)    Past Surgical History:  Procedure Laterality Date  . ABDOMINAL HYSTERECTOMY    . APPENDECTOMY    . CARDIAC CATHETERIZATION  05/2013  . CARDIAC SURGERY    . CATARACT EXTRACTION  03/2016  . CHOLECYSTECTOMY  1962  . CORONARY ANGIOPLASTY WITH STENT PLACEMENT  Aug. 2005 and March 2015  . ROTATOR CUFF REPAIR Right   . VENTRAL HERNIA REPAIR  2002   Family History  Problem Relation Age of Onset  . Diabetes Mother   . Cancer Sister   . Diabetes Sister   . Heart disease Sister   . Heart attack Sister   . Hyperlipidemia Sister   . Hypertension Sister   . Cancer Brother   . Diabetes Brother   .  Heart disease Brother   . Heart attack Brother   . Hyperlipidemia Brother   . Hypertension Brother   . Diabetes Brother   . Diabetes Brother   . Kidney failure Brother    Social History   Substance and Sexual Activity  Sexual Activity No    Outpatient Encounter Medications as of 01/08/2017  Medication Sig  . acetaminophen (TYLENOL) 500 MG tablet Take 1 tablet (500 mg total) by mouth 2 (two) times daily as needed for moderate pain.  Marland Kitchen. albuterol (PROVENTIL HFA;VENTOLIN HFA) 108 (90 Base) MCG/ACT inhaler Inhale 2 puffs into the lungs every 6 (six) hours as needed for wheezing or shortness of breath.  Marland Kitchen. albuterol (PROVENTIL) (2.5 MG/3ML) 0.083% nebulizer solution Take 3 mLs (2.5 mg total) by nebulization every 6 (six) hours as needed for Wheezing.  Marland Kitchen. aspirin 81 MG tablet Take 1 tablet (81 mg total) by mouth daily.  Marland Kitchen. atorvastatin (LIPITOR) 10 MG tablet Take 1 tablet (10 mg total) by mouth daily.  . baclofen (LIORESAL) 20 MG tablet Take 1 tablet (20 mg total) by mouth 2 (two) times daily as needed for muscle spasms.  . carvedilol (  COREG) 3.125 MG tablet Take 1 tablet (3.125 mg total) by mouth daily.  . Fluticasone-Salmeterol (ADVAIR) 100-50 MCG/DOSE AEPB Inhale 1 puff into the lungs 2 (two) times daily.  Marland Kitchen gabapentin (NEURONTIN) 300 MG capsule Take 1 capsule (300 mg total) by mouth 3 (three) times daily as needed.  . lidocaine (LIDODERM) 5 % Place 1 patch onto the skin daily. Remove & Discard patch within 12 hours or as directed by MD  . LORazepam (ATIVAN) 0.5 MG tablet TAKE 1 TABLET BY MOUTH TWICE DAILY AS NEEDED FOR ANXIETY  . magnesium hydroxide (MILK OF MAGNESIA) 400 MG/5ML suspension Take by mouth daily as needed for mild constipation.  . pantoprazole (PROTONIX) 40 MG tablet TAKE 1 TABLET BY MOUTH ONCE DAILY  . phenytoin (DILANTIN) 100 MG ER capsule Take 2 capsules (200 mg total) by mouth at bedtime. Take 2 by mouth at bedtime  . sertraline (ZOLOFT) 100 MG tablet Take 1 tablet (100 mg  total) by mouth daily.  . nitroGLYCERIN (NITROSTAT) 0.4 MG SL tablet Place 1 tablet (0.4 mg total) under the tongue every 5 (five) minutes as needed for chest pain. (Patient not taking: Reported on 09/10/2016)  . potassium chloride (K-DUR) 10 MEQ tablet Take 1 tablet (10 mEq total) by mouth daily. (Patient not taking: Reported on 01/08/2017)  . QUEtiapine (SEROQUEL) 50 MG tablet Take 1 tablet (50 mg total) by mouth at bedtime. (Patient not taking: Reported on 01/08/2017)  . [DISCONTINUED] docusate sodium (COLACE) 100 MG capsule Take 1 tablet once or twice daily as needed for constipation (Patient not taking: Reported on 01/08/2017)  . [DISCONTINUED] furosemide (LASIX) 20 MG tablet Take 0.5 tablets (10 mg total) by mouth daily. (Patient not taking: Reported on 12/04/2016)  . [DISCONTINUED] furosemide (LASIX) 20 MG tablet TAKE 1 TABLET BY MOUTH ONCE DAILY FOR 3 DAYS .  REPEAT  FOR  3  MORE  DAYS  IF  SWELLING  PERSISTS. (Patient not taking: Reported on 12/04/2016)  . [DISCONTINUED] oxyCODONE-acetaminophen (ROXICET) 5-325 MG tablet Take 1 tablet by mouth every 6 (six) hours as needed for severe pain. (Patient not taking: Reported on 01/08/2017)   No facility-administered encounter medications on file as of 01/08/2017.     Activities of Daily Living In your present state of health, do you have any difficulty performing the following activities: 01/08/2017 07/11/2016  Hearing? Y Y  Comment bilateral hearing aids -  Vision? Y Y  Comment has macular degeneration -  Difficulty concentrating or making decisions? N Y  Walking or climbing stairs? Y N  Dressing or bathing? N N  Doing errands, shopping? N Y  Quarry manager and eating ? N -  Using the Toilet? N -  In the past six months, have you accidently leaked urine? N -  Do you have problems with loss of bowel control? N -  Managing your Medications? Y -  Comment daughter does -  Managing your Finances? Y -  Comment daughter does -  Teacher, English as a foreign language? Y -  Comment daughter does -  Some recent data might be hidden    Patient Care Team: Galen Manila, NP as PCP - General (Nurse Practitioner)    Assessment:     Exercise Activities and Dietary recommendations Current Exercise Habits: The patient does not participate in regular exercise at present, Exercise limited by: None identified  Goals    . DIET - INCREASE WATER INTAKE     Recommend drinking at least 6-8 glasses of water a  day       Fall Risk Fall Risk  01/08/2017 08/21/2016 04/25/2016 03/26/2016  Falls in the past year? Yes Yes No No  Number falls in past yr: 2 or more 2 or more - -  Injury with Fall? No Yes - -  Risk Factor Category  High Fall Risk High Fall Risk - -  Risk for fall due to : - History of fall(s);Impaired balance/gait;Impaired vision;Impaired mobility - -  Follow up Falls prevention discussed - - -   Depression Screen PHQ 2/9 Scores 01/08/2017 10/22/2016 07/16/2016 05/29/2016  PHQ - 2 Score 0 0 4 3  PHQ- 9 Score - 10 16 -     Cognitive Function     6CIT Screen 01/08/2017  What Year? 0 points  What month? 0 points  What time? 0 points  Count back from 20 0 points  Months in reverse 0 points  Repeat phrase 2 points  Total Score 2    Immunization History  Administered Date(s) Administered  . Influenza, High Dose Seasonal PF 12/04/2016  . Influenza, Seasonal, Injecte, Preservative Fre 11/19/2012, 12/21/2013  . Pneumococcal Conjugate-13 03/10/2015  . Pneumococcal Polysaccharide-23 12/21/2013   Screening Tests Health Maintenance  Topic Date Due  . TETANUS/TDAP  01/08/2018 (Originally 05/24/1947)  . INFLUENZA VACCINE  Completed  . DEXA SCAN  Completed  . PNA vac Low Risk Adult  Completed      Plan:      I have personally reviewed and addressed the Medicare Annual Wellness questionnaire and have noted the following in the patient's chart:  A. Medical and social history B. Use of alcohol, tobacco or  illicit drugs  C. Current medications and supplements D. Functional ability and status E.  Nutritional status F.  Physical activity G. Advance directives H. List of other physicians I.  Hospitalizations, surgeries, and ER visits in previous 12 months J.  Vitals K. Screenings such as hearing and vision if needed, cognitive and depression L. Referrals and appointments   In addition, I have reviewed and discussed with patient certain preventive protocols, quality metrics, and best practice recommendations. A written personalized care plan for preventive services as well as general preventive health recommendations were provided to patient.   Signed,  Marin Roberts, LPN Nurse Health Advisor   Nurse Notes: needs a new nebulizer, patients is broken.

## 2017-01-15 ENCOUNTER — Telehealth: Payer: Self-pay

## 2017-01-15 ENCOUNTER — Other Ambulatory Visit: Payer: Self-pay | Admitting: Family Medicine

## 2017-01-15 ENCOUNTER — Other Ambulatory Visit: Payer: Self-pay | Admitting: Nurse Practitioner

## 2017-01-15 DIAGNOSIS — G8929 Other chronic pain: Secondary | ICD-10-CM

## 2017-01-15 DIAGNOSIS — M546 Pain in thoracic spine: Secondary | ICD-10-CM

## 2017-01-15 DIAGNOSIS — F419 Anxiety disorder, unspecified: Secondary | ICD-10-CM

## 2017-01-15 DIAGNOSIS — M545 Low back pain: Principal | ICD-10-CM

## 2017-01-15 MED ORDER — GABAPENTIN 300 MG PO CAPS: 300.0000 mg | ORAL_CAPSULE | Freq: Three times a day (TID) | ORAL | 1 refills | Status: DC | PRN

## 2017-01-16 ENCOUNTER — Other Ambulatory Visit: Payer: Self-pay

## 2017-01-17 MED ORDER — SERTRALINE HCL 100 MG PO TABS: 100.0000 mg | ORAL_TABLET | Freq: Every day | ORAL | 0 refills | Status: DC

## 2017-01-17 NOTE — Telephone Encounter (Signed)
Open in error

## 2017-01-21 ENCOUNTER — Ambulatory Visit: Payer: Medicare HMO | Admitting: Nurse Practitioner

## 2017-01-28 ENCOUNTER — Ambulatory Visit
Admission: RE | Admit: 2017-01-28 | Discharge: 2017-01-28 | Disposition: A | Discharge status: Home / Self Care | Payer: Medicare HMO | Source: Ambulatory Visit | Attending: Nurse Practitioner | Admitting: Nurse Practitioner

## 2017-01-28 ENCOUNTER — Ambulatory Visit (INDEPENDENT_AMBULATORY_CARE_PROVIDER_SITE_OTHER): Payer: Medicare HMO | Admitting: Nurse Practitioner

## 2017-01-28 ENCOUNTER — Other Ambulatory Visit: Payer: Self-pay

## 2017-01-28 VITALS — BP 161/64 | HR 65 | Temp 97.5°F | Ht 60.0 in | Wt 148.4 lb

## 2017-01-28 DIAGNOSIS — G8929 Other chronic pain: Secondary | ICD-10-CM | POA: Insufficient documentation

## 2017-01-28 DIAGNOSIS — M542 Cervicalgia: Secondary | ICD-10-CM

## 2017-01-28 DIAGNOSIS — M25811 Other specified joint disorders, right shoulder: Secondary | ICD-10-CM | POA: Diagnosis not present

## 2017-01-28 DIAGNOSIS — F324 Major depressive disorder, single episode, in partial remission: Secondary | ICD-10-CM | POA: Diagnosis not present

## 2017-01-28 DIAGNOSIS — M25511 Pain in right shoulder: Secondary | ICD-10-CM | POA: Diagnosis not present

## 2017-01-28 MED ORDER — GABAPENTIN 300 MG PO CAPS: 600.0000 mg | ORAL_CAPSULE | Freq: Three times a day (TID) | ORAL | 1 refills | Status: DC | PRN

## 2017-01-28 MED ORDER — SERTRALINE HCL 100 MG PO TABS: 100.0000 mg | ORAL_TABLET | Freq: Every day | ORAL | 1 refills | Status: DC

## 2017-01-28 MED ORDER — QUETIAPINE FUMARATE 50 MG PO TABS: 50.0000 mg | ORAL_TABLET | Freq: Every day | ORAL | 3 refills | Status: DC

## 2017-01-28 NOTE — Progress Notes (Signed)
Subjective:    Patient ID: Sandy BushyMargaret Daniel, female    DOB: 1928/06/14, 83 y.o.   MRN: [id number]  Sandy Daniel is a 83 y.o. female presenting on 01/28/2017 for Hypertension; Insomnia; and Shoulder Pain (that radiate down the arm, also chronic back pain x 6-7 mths )  HPI Chronic Pain Pt has had no-show appointment and cancelled appointment at pain management.  She has not yet been seen there.  Encouraged pt to call and reschedule, but to keep that appointment to ensure she is able to transition to pain management.   - Pt has persistent chronic back, neck, and shoulder pain.  She "would like to have MRI of neck, shoulder, and back." Pt reports chronic pain in her right shoulder worse over the last 1 year.  She is unable to lift her right arm and it is difficult to put on clothing. - Pt also has arthritis of hip. - Pt felt pop in her neck about 1 year ago and has had worsening pain since that time.  She notes no improvement and no augmentation of the pain except for with opioids.    Insomnia: wakes about 3-4 times per night  Pain makes it difficult to get out of bed also.  Anxiety Pt takes lorazepam twice daily w/ one dose at bedtime and also uses one dose during daytime if needed.  Currently, she reports this is controlling her symptoms.  Hypertension 140/70-80s when not in significant pain.  Regularly at home and in clinic BP ranges into 160-170s/60-70s. Pt denies headache, lightheadedness, dizziness, changes in vision, chest tightness/pressure, palpitations, leg swelling, sudden loss of speech or loss of consciousness. Has started eating more salt w/ bacon each am.  Nebulizer Malfunction - "Needs a new nebulizer."  Upon further questions, pt likely only needs new attachments as pt states the machine runs and functions, but the tubes and medication canister pop apart in multiple locations.  Social History   Tobacco Use  . Smoking status: Current Every Day Smoker    Packs/day:  0.25    Years: 50.00    Pack years: 12.50    Types: Cigarettes  . Smokeless tobacco: Never Used  . Tobacco comment: 10 a day   Substance Use Topics  . Alcohol use: No  . Drug use: No    Review of Systems Per HPI unless specifically indicated above     Objective:    BP (!) 161/64 (BP Location: Left Arm, Patient Position: Sitting, Cuff Size: Normal)   Pulse 65   Temp (!) 97.5 F (36.4 C) (Oral)   Ht 5' (1.524 m)   Wt 148 lb 6.4 oz (67.3 kg)   BMI 28.98 kg/m   Wt Readings from Last 3 Encounters:  01/28/17 148 lb 6.4 oz (67.3 kg)  01/08/17 148 lb 9.6 oz (67.4 kg)  12/15/16 146 lb (66.2 kg)    Physical Exam  Constitutional: She is oriented to person, place, and time. She appears well-developed and well-nourished.  HENT:  Head: Normocephalic and atraumatic.  Right Ear: Decreased hearing is noted.  Left Ear: Decreased hearing is noted.  Neck: Muscular tenderness present. No spinous process tenderness present. Neck rigidity present. Decreased range of motion present.  Cardiovascular: Normal rate, regular rhythm, normal heart sounds and intact distal pulses.  Pulmonary/Chest: Effort normal and breath sounds normal. No respiratory distress.  Musculoskeletal:  Spine: Back normal with forward head posture, severe kyphosis, stooped/antalgic gait using 2-wheel walker.  Bilateral Shoulders Inspection: Normal appearance bilateral symmetrical Palpation: Non-tender  to palpation over anterior, lateral, or posterior shoulder; tender trapezius muscles ROM: Limited active ROM forward flexion to 90 degrees, abduction to 90 degrees on right; 110 degrees for forward flexion/abduction on left. Significantly decreased internal rotation bilaterally, Normal external rotation. PROM with same limitations as AROM. Special Testing: Rotator cuff testing positive for weakness with supraspinatus full can and empty can test, Hawkin's AC impingement positive for pain Strength: Reduced strength 4/5 flex/ext,  ext rot / int rot, grip; 3/5 rotator cuff str testing. Neurovascular: Distally intact pulses, sensation to light touch.  Neurological: She is alert and oriented to person, place, and time.  Skin: Skin is warm and dry.  Psychiatric: Her speech is normal. Judgment and thought content normal. Her mood appears anxious. She is agitated.  Vitals reviewed.  Results for orders placed or performed during the hospital encounter of 07/11/16  Blood culture (routine x 2)  Result Value Ref Range   Specimen Description BLOOD   RIGHT AC    Special Requests BOTTLES DRAWN AEROBIC AND ANAEROBIC  BCAV    Culture NO GROWTH 5 DAYS    Report Status 07/16/2016 FINAL   Blood culture (routine x 2)  Result Value Ref Range   Specimen Description BLOOD  RIGHT AC    Special Requests BOTTLES DRAWN AEROBIC AND ANAEROBIC  BCAV    Culture NO GROWTH 5 DAYS    Report Status 07/16/2016 FINAL   CBC with Differential  Result Value Ref Range   WBC 5.6 3.6 - 11.0 K/uL   RBC 4.26 3.80 - 5.20 MIL/uL   Hemoglobin 12.7 12.0 - 16.0 g/dL   HCT 16.1 09.6 - 04.5 %   MCV 89.4 80.0 - 100.0 fL   MCH 29.7 26.0 - 34.0 pg   MCHC 33.2 32.0 - 36.0 g/dL   RDW 40.9 (H) 81.1 - 91.4 %   Platelets 270 150 - 440 K/uL   Neutrophils Relative % 63 %   Neutro Abs 3.6 1.4 - 6.5 K/uL   Lymphocytes Relative 24 %   Lymphs Abs 1.4 1.0 - 3.6 K/uL   Monocytes Relative 11 %   Monocytes Absolute 0.6 0.2 - 0.9 K/uL   Eosinophils Relative 1 %   Eosinophils Absolute 0.1 0 - 0.7 K/uL   Basophils Relative 1 %   Basophils Absolute 0.0 0 - 0.1 K/uL  Comprehensive metabolic panel  Result Value Ref Range   Sodium 141 135 - 145 mmol/L   Potassium 4.2 3.5 - 5.1 mmol/L   Chloride 107 101 - 111 mmol/L   CO2 26 22 - 32 mmol/L   Glucose, Bld 98 65 - 99 mg/dL   BUN 24 (H) 6 - 20 mg/dL   Creatinine, Ser 7.82 0.44 - 1.00 mg/dL   Calcium 9.2 8.9 - 95.6 mg/dL   Total Protein 7.8 6.5 - 8.1 g/dL   Albumin 3.7 3.5 - 5.0 g/dL   AST 24 15 - 41 U/L   ALT 18 14 -  54 U/L   Alkaline Phosphatase 175 (H) 38 - 126 U/L   Total Bilirubin 0.6 0.3 - 1.2 mg/dL   GFR calc non Af Amer >60 >60 mL/min   GFR calc Af Amer >60 >60 mL/min   Anion gap 8 5 - 15  Troponin I  Result Value Ref Range   Troponin I <0.03 <0.03 ng/mL  Brain natriuretic peptide  Result Value Ref Range   B Natriuretic Peptide 105.0 (H) 0.0 - 100.0 pg/mL  Magnesium  Result Value Ref Range  Magnesium 2.0 1.7 - 2.4 mg/dL  Fibrin derivatives D-Dimer ([facility] only)  Result Value Ref Range   Fibrin derivatives D-dimer ([facility]) 2,359.92 (H) 0.00 - 499.00  Basic metabolic panel  Result Value Ref Range   Sodium 141 135 - 145 mmol/L   Potassium 3.7 3.5 - 5.1 mmol/L   Chloride 107 101 - 111 mmol/L   CO2 28 22 - 32 mmol/L   Glucose, Bld 76 65 - 99 mg/dL   BUN 22 (H) 6 - 20 mg/dL   Creatinine, Ser 1.61 0.44 - 1.00 mg/dL   Calcium 8.6 (L) 8.9 - 10.3 mg/dL   GFR calc non Af Amer >60 >60 mL/min   GFR calc Af Amer >60 >60 mL/min   Anion gap 6 5 - 15  CBC  Result Value Ref Range   WBC 5.8 3.6 - 11.0 K/uL   RBC 3.73 (L) 3.80 - 5.20 MIL/uL   Hemoglobin 11.5 (L) 12.0 - 16.0 g/dL   HCT 09.6 (L) 04.5 - 40.9 %   MCV 90.3 80.0 - 100.0 fL   MCH 30.9 26.0 - 34.0 pg   MCHC 34.2 32.0 - 36.0 g/dL   RDW 81.1 (H) 91.4 - 78.2 %   Platelets 226 150 - 440 K/uL  ECHOCARDIOGRAM COMPLETE  Result Value Ref Range   Weight 2,560 oz   Height 59 in   BP 153/69 mmHg      Assessment & Plan:   Problem List Items Addressed This Visit      Other   Depression    Pt continues to have agitation, depression complicated by chronic pain.  Pt requests refill of sertraline today.  Plan: 1. Continue sertraline 100 mg once daily 2. Followup 3-6 months.  Encouraged good pain control.  Needs to follow through with pain management.      Relevant Medications   QUEtiapine (SEROQUEL) 50 MG tablet   sertraline (ZOLOFT) 100 MG tablet   Neck pain - Primary    Pt continues to have chronic neck pain.  Pt reports pain has been  severe since she felt a pop about 1 year ago.  She states she had an xray of her hip but never had imaging of her neck.  She wants an MRI.    Plan: 1. Discussed nature of degenerative disc disease and arthritis.  She did have Xray in Dec 2017, but requests repeat Xray today.  Pt would like to have increased mobility in her neck and pain relief. 2. Increase gabapentin to 600 mg tid for pain.  Encouraged pt to follow through with pain management clinic. 3. Cervical spine Xray today.  Consider MRI in future, but given patient's advanced age and frailty cervical spine surgery will be unlikely.  Will follow through with neurosurgery as pt desires. 4. Follow up after c-spine x ray.      Relevant Medications   gabapentin (NEURONTIN) 300 MG capsule   Other Relevant Orders   DG Cervical Spine Complete (Completed)   Chronic right shoulder pain    Pt reports chronic pain in her right shoulder worse over the last 1 year.  She is unable to lift her right arm and it is difficult to put on clothing.  Suspect rotator cuff impingement w/ equal limitations of AROM and PROM.  Plan: 1. Proceed w/ right shoulder Xray today. 2. Referral to orthopedics after Xray results.  Pt desires to go if there is anything identified that could be repaired. 3. Followup as needed and in 3 months.  Relevant Medications   gabapentin (NEURONTIN) 300 MG capsule   sertraline (ZOLOFT) 100 MG tablet   Other Relevant Orders   DG Shoulder Right (Completed)   Ambulatory referral to Orthopedic Surgery      Meds ordered this encounter  Medications  . gabapentin (NEURONTIN) 300 MG capsule    Sig: Take 2 capsules (600 mg total) by mouth 3 (three) times daily as needed (moderate pain).    Dispense:  270 capsule    Refill:  1    Order Specific Question:   Supervising Provider    Answer:   Smitty Cords [[id number]]  . QUEtiapine (SEROQUEL) 50 MG tablet    Sig: Take 1 tablet (50 mg total) by mouth at bedtime.     Dispense:  30 tablet    Refill:  3    Order Specific Question:   Supervising Provider    Answer:   Smitty Cords [[id number]]  . sertraline (ZOLOFT) 100 MG tablet    Sig: Take 1 tablet (100 mg total) by mouth daily.    Dispense:  90 tablet    Refill:  1    Order Specific Question:   Supervising Provider    Answer:   Smitty Cords [[id number]]   Follow up plan: Return in about 3 months (around 04/28/2017) for hypertension, insomnia, anxiety.  [name], DNP, AGPCNP-BC Adult Gerontology Primary Care Nurse Practitioner [facility] 01/31/2017, 8:15 AM

## 2017-01-28 NOTE — Patient Instructions (Addendum)
Sandy Daniel, Thank you for coming in to clinic today.  1. Please call to reschedule appointment.  If you need a new referral please call [facility]. [facility] [address], [address], [place], [place] [address]  Main: [phone]    2. Try increasing gabapentin to 600 mg three times daily for pain.   3. Xray of neck and right shoulder today.  We may need to send you to orthopedics.   Please schedule a follow-up appointment with Sandy Daniel, AGNP. Return in about 3 months (around 04/28/2017) for hypertension, insomnia, anxiety.  If you have any other questions or concerns, please feel free to call the clinic or send a message through MyChart. You may also schedule an earlier appointment if necessary.  You will receive a survey after today's visit either digitally by e-mail or paper by [employer]. Your experiences and feedback matter to [place].  Please respond so we know how we are doing as we provide care for you.  Sandy Mcardle, DNP, AGNP-BC Adult Gerontology Nurse Practitioner [facility], [facility]

## 2017-01-31 ENCOUNTER — Telehealth: Payer: Self-pay

## 2017-01-31 ENCOUNTER — Encounter: Payer: Self-pay | Admitting: Nurse Practitioner

## 2017-01-31 DIAGNOSIS — G8929 Other chronic pain: Secondary | ICD-10-CM | POA: Insufficient documentation

## 2017-01-31 DIAGNOSIS — M25511 Pain in right shoulder: Secondary | ICD-10-CM

## 2017-01-31 DIAGNOSIS — M542 Cervicalgia: Secondary | ICD-10-CM | POA: Insufficient documentation

## 2017-01-31 NOTE — Assessment & Plan Note (Signed)
Pt reports chronic pain in her right shoulder worse over the last 1 year.  She is unable to lift her right arm and it is difficult to put on clothing.  Suspect rotator cuff impingement w/ equal limitations of AROM and PROM.  Plan: 1. Proceed w/ right shoulder Xray today. 2. Referral to orthopedics after Xray results.  Pt desires to go if there is anything identified that could be repaired. 3. Followup as needed and in 3 months.

## 2017-01-31 NOTE — Assessment & Plan Note (Signed)
Pt continues to have chronic neck pain.  Pt reports pain has been severe since she felt a pop about 1 year ago.  She states she had an xray of her hip but never had imaging of her neck.  She wants an MRI.    Plan: 1. Discussed nature of degenerative disc disease and arthritis.  She did have Xray in Dec 2017, but requests repeat Xray today.  Pt would like to have increased mobility in her neck and pain relief. 2. Increase gabapentin to 600 mg tid for pain.  Encouraged pt to follow through with pain management clinic. 3. Cervical spine Xray today.  Consider MRI in future, but given patient's advanced age and frailty cervical spine surgery will be unlikely.  Will follow through with neurosurgery as pt desires. 4. Follow up after c-spine x ray.

## 2017-01-31 NOTE — Assessment & Plan Note (Signed)
Pt continues to have agitation, depression complicated by chronic pain.  Pt requests refill of sertraline today.  Plan: 1. Continue sertraline 100 mg once daily 2. Followup 3-6 months.  Encouraged good pain control.  Needs to follow through with pain management.

## 2017-01-31 NOTE — Telephone Encounter (Signed)
I contacted  [employer] and spoke with Ashlee about getting the patient a new extender tubing. He informed me that he will reach out to the sale rep in this area and have them to contact the patient.

## 2017-01-31 NOTE — Telephone Encounter (Signed)
-----   Message from Galen Manila, NP sent at 01/29/2017  2:57 PM EST ----- Regarding: Nebulizer replacement parts Please request [employer] to evaluate and replace the external tubing for her nebulizer.  The machine is still functioning, but her tubes pop apart and make nebulizer administration difficult.

## 2017-03-12 ENCOUNTER — Ambulatory Visit
Payer: Medicare HMO | Attending: Student in an Organized Health Care Education/Training Program | Admitting: Student in an Organized Health Care Education/Training Program

## 2017-04-03 ENCOUNTER — Ambulatory Visit: Payer: Medicare HMO | Admitting: Student in an Organized Health Care Education/Training Program

## 2017-04-11 ENCOUNTER — Other Ambulatory Visit: Payer: Self-pay

## 2017-04-11 ENCOUNTER — Ambulatory Visit
Payer: Medicare HMO | Attending: Student in an Organized Health Care Education/Training Program | Admitting: Student in an Organized Health Care Education/Training Program

## 2017-04-11 ENCOUNTER — Encounter: Payer: Self-pay | Admitting: Student in an Organized Health Care Education/Training Program

## 2017-04-11 VITALS — BP 166/76 | HR 61 | Temp 98.7°F | Resp 16 | Ht 59.0 in | Wt 152.0 lb

## 2017-04-11 DIAGNOSIS — G894 Chronic pain syndrome: Secondary | ICD-10-CM | POA: Diagnosis not present

## 2017-04-11 DIAGNOSIS — F329 Major depressive disorder, single episode, unspecified: Secondary | ICD-10-CM | POA: Insufficient documentation

## 2017-04-11 DIAGNOSIS — F410 Panic disorder [episodic paroxysmal anxiety] without agoraphobia: Secondary | ICD-10-CM | POA: Insufficient documentation

## 2017-04-11 DIAGNOSIS — Z951 Presence of aortocoronary bypass graft: Secondary | ICD-10-CM | POA: Insufficient documentation

## 2017-04-11 DIAGNOSIS — M542 Cervicalgia: Secondary | ICD-10-CM | POA: Diagnosis not present

## 2017-04-11 DIAGNOSIS — M50323 Other cervical disc degeneration at C6-C7 level: Secondary | ICD-10-CM | POA: Insufficient documentation

## 2017-04-11 DIAGNOSIS — I10 Essential (primary) hypertension: Secondary | ICD-10-CM | POA: Insufficient documentation

## 2017-04-11 DIAGNOSIS — M546 Pain in thoracic spine: Secondary | ICD-10-CM | POA: Diagnosis not present

## 2017-04-11 DIAGNOSIS — M5441 Lumbago with sciatica, right side: Secondary | ICD-10-CM | POA: Insufficient documentation

## 2017-04-11 DIAGNOSIS — I2510 Atherosclerotic heart disease of native coronary artery without angina pectoris: Secondary | ICD-10-CM | POA: Diagnosis not present

## 2017-04-11 DIAGNOSIS — K219 Gastro-esophageal reflux disease without esophagitis: Secondary | ICD-10-CM | POA: Diagnosis not present

## 2017-04-11 DIAGNOSIS — J449 Chronic obstructive pulmonary disease, unspecified: Secondary | ICD-10-CM | POA: Insufficient documentation

## 2017-04-11 DIAGNOSIS — G40909 Epilepsy, unspecified, not intractable, without status epilepticus: Secondary | ICD-10-CM | POA: Insufficient documentation

## 2017-04-11 DIAGNOSIS — M5030 Other cervical disc degeneration, unspecified cervical region: Secondary | ICD-10-CM

## 2017-04-11 DIAGNOSIS — E785 Hyperlipidemia, unspecified: Secondary | ICD-10-CM | POA: Insufficient documentation

## 2017-04-11 DIAGNOSIS — M5136 Other intervertebral disc degeneration, lumbar region: Secondary | ICD-10-CM | POA: Insufficient documentation

## 2017-04-11 DIAGNOSIS — M25562 Pain in left knee: Secondary | ICD-10-CM | POA: Insufficient documentation

## 2017-04-11 DIAGNOSIS — M47812 Spondylosis without myelopathy or radiculopathy, cervical region: Secondary | ICD-10-CM | POA: Diagnosis not present

## 2017-04-11 DIAGNOSIS — M25552 Pain in left hip: Secondary | ICD-10-CM | POA: Diagnosis not present

## 2017-04-11 DIAGNOSIS — M25551 Pain in right hip: Secondary | ICD-10-CM

## 2017-04-11 DIAGNOSIS — M5442 Lumbago with sciatica, left side: Secondary | ICD-10-CM | POA: Insufficient documentation

## 2017-04-11 DIAGNOSIS — M25511 Pain in right shoulder: Secondary | ICD-10-CM | POA: Insufficient documentation

## 2017-04-11 DIAGNOSIS — G8929 Other chronic pain: Secondary | ICD-10-CM

## 2017-04-11 NOTE — Progress Notes (Signed)
Patient's Name: Sandy Daniel  MRN: [id number]  Referring Provider: Mikey College, *  DOB: 06-10-1928  PCP: Mikey College, NP  DOS: 04/11/2017  Note by: Gillis Santa, MD  Service setting: Ambulatory outpatient  Specialty: Interventional Pain Management  Location: [facility] (AMB) Pain Management Facility  Visit type: Initial Patient Evaluation  Patient type: New Patient   Primary Reason(s) for Visit: Encounter for initial evaluation of one or more chronic problems (new to examiner) potentially causing chronic pain, and posing a threat to normal musculoskeletal function. (Level of risk: High) CC: Shoulder Pain (right); Knee Pain (left); Neck Pain; and Back Pain (left)  HPI  Sandy Daniel is a 84 y.o. year old, female patient, who comes today to see [place] for the first time for an initial evaluation of her chronic pain. She has CAD (coronary artery disease); Seizure disorder ([place]); Macular degeneration of both eyes; B12 deficiency; COPD (chronic obstructive pulmonary disease) ([place]); HTN (hypertension); Hyperlipidemia; Arthritis; Depression; GERD (gastroesophageal reflux disease); Dysphagia; Panic disorder; Controlled substance agreement signed; Physiological tremor; Anxiety; Chronic bilateral low back pain; Unstable gait; (HFpEF) heart failure with preserved ejection fraction ([place]); Muscle spasm of back; Chronic bilateral thoracic back pain; Neck pain; and Chronic right shoulder pain on their problem list. Today she comes in for evaluation of her Shoulder Pain (right); Knee Pain (left); Neck Pain; and Back Pain (left)  Pain Assessment: Location: Lower, Left Back Radiating:  yes Onset: More than a month ago Duration: Chronic pain Quality: Aching Severity: 9 /10 (self-reported pain score)  Note: Reported level is inconsistent with clinical observations. Clinically the patient looks like a 3/10 A 3/10 is viewed as "Moderate" and described as significantly interfering with activities of daily  living (ADL). It becomes difficult to feed, bathe, get dressed, get on and off the toilet or to perform personal hygiene functions. Difficult to get in and out of bed or a chair without assistance. Very distracting. With effort, it can be ignored when deeply involved in activities.       When using our objective Pain Scale, levels between 6 and 10/10 are said to belong in an emergency room, as it progressively worsens from a 6/10, described as severely limiting, requiring emergency care not usually available at an outpatient pain management facility. At a 6/10 level, communication becomes difficult and requires great effort. Assistance to reach the emergency department may be required. Facial flushing and profuse sweating along with potentially dangerous increases in heart rate and blood pressure will be evident. Effect on ADL:  limits Timing: Constant Modifying factors: Ibuprofen  Onset and Duration: Present longer than 3 months Cause of pain: Arthritis Severity: Getting worse Timing: Evening Aggravating Factors: Lifiting, Motion, Prolonged sitting, Twisting and Walking Alleviating Factors: Stretching, Lying down, Medications, Resting and Sleeping Associated Problems: Night-time cramps, Depression, Numbness, Sadness, Temperature changes, Tingling and Weakness Quality of Pain: Annoying, Burning, Constant, Exhausting, Pressure-like, Pulsating, Punishing, Sharp, Shooting, Stabbing, Tender and Throbbing Previous Examinations or Tests: Ct-Myelogram, X-rays, Neurological evaluation and Orthopedic evaluation Previous Treatments: Narcotic medications, Physical Therapy, Pool exercises, Relaxation therapy, Steroid treatments by mouth, Strengthening exercises, Stretching exercises and TENS  The patient comes into the clinics today for the first time for a chronic pain management evaluation.  84 year old female who presents with multiple pain complaints including left neck pain that radiates into her left  shoulder along with left thoracic pain, lower lumbar pain and bilateral knee pain most more pronounced on the left.  Patient describes a knot on the left side of  her neck that is worse with movement.  She states that she has limited ability to rotate her neck before experiencing discomfort.  Patient's low back pain radiates into bilateral buttocks.  Patient denies any new or significant lower extremity weakness.  She is fairly deconditioned.  She recently had a left shoulder cortisone injection with orthopedics which was helpful.  Current medications include gabapentin 600 mg 3 times daily, Ativan 0.5 mill grams twice daily as needed, Zoloft.  She is currently not on any opioid medications.  Her last opioid fill was Percocet, 5 mg, quantity 10, last fill 12/15/2016.  She states that the hydrocodone was helpful.  Patient endorses nausea and vomiting with tramadol.  Today I took the time to provide the patient with information regarding my pain practice. The patient was informed that my practice is divided into two sections: an interventional pain management section, as well as a completely separate and distinct medication management section. I explained that I have procedure days for my interventional therapies, and evaluation days for follow-ups and medication management. Because of the amount of documentation required during both, they are kept separated. This means that there is the possibility that she may be scheduled for a procedure on one day, and medication management the next. I have also informed her that because of staffing and facility limitations, I no longer take patients for medication management only. To illustrate the reasons for this, I gave the patient the example of surgeons, and how inappropriate it would be to refer a patient to his/her care, just to write for the post-surgical antibiotics on a surgery done by a different surgeon.   Because interventional pain management is my board-certified  specialty, the patient was informed that joining my practice means that they are open to any and all interventional therapies. I made it clear that this does not mean that they will be forced to have any procedures done. What this means is that I believe interventional therapies to be essential part of the diagnosis and proper management of chronic pain conditions. Therefore, patients not interested in these interventional alternatives will be better served under the care of a different practitioner.  The patient was also made aware of my Comprehensive Pain Management Safety Guidelines where by joining my practice, they limit all of their nerve blocks and joint injections to those done by our practice, for as long as we are retained to manage their care.   Historic Controlled Substance Pharmacotherapy Review  PMP and historical list of controlled substances: Ativan 0.5 mg, quantity 60, last filled 03/24/2017, Percocet 5 mill grams, quantity 10, last fill 12/18/2016.  Currently not on opioid therapy. Medications: The patient did not bring the medication(s) to the appointment, as requested in our "New Patient Package" Pharmacodynamics: Desired effects: Analgesia: The patient reports <50% benefit. Reported improvement in function: The patient reports being unable to accomplish basic ADLs. Clinically meaningful improvement in function (CMIF): Sustained CMIF goals met Perceived effectiveness: Described as ineffective and would like to make some changes Undesirable effects: Side-effects or Adverse reactions: None reported Historical Monitoring: The patient  reports that she does not use drugs. List of all UDS Test(s): No results found for: MDMA, COCAINSCRNUR, [place], [place], CANNABQUANT, [place], [place] List of other Serum/Urine Drug Screening Test(s):  No results found for: AMPHSCRSER, BARBSCRSER, BENZOSCRSER, COCAINSCRSER, COCAINSCRNUR, PCPSCRSER, PCPQUANT, THCSCRSER, THCU, CANNABQUANT, OPIATESCRSER,  OXYSCRSER, PROPOXSCRSER, ETH Historical Background Evaluation: [place] PMP: Six (6) year initial data search conducted.  [place] Department of public safety, offender search: Editor, commissioning Information) Non-contributory Risk Assessment Profile: Aberrant behavior: None observed or detected today Risk factors for fatal opioid overdose: None identified today Fatal overdose hazard ratio (HR): Calculation deferred Non-fatal overdose hazard ratio (HR): Calculation deferred Risk of opioid abuse or dependence: 0.7-3.0% with doses ? 36 MME/day and 6.1-26% with doses ? 120 MME/day. Substance use disorder (SUD) risk level: Low Opioid risk tool (ORT) (Total Score): 0 Opioid Risk Tool - 04/11/17 1136      Family History of Substance Abuse   Alcohol  Negative    Illegal Drugs  Negative    Rx Drugs  Negative      Personal History of Substance Abuse   Alcohol  Negative    Illegal Drugs  Negative    Rx Drugs  Negative      Age   Age between 17-45 years   No      History of Preadolescent Sexual Abuse   History of Preadolescent Sexual Abuse  Negative or Female      Psychological Disease   Psychological Disease  Negative    Depression  Negative      Total Score   Opioid Risk Tool Scoring  0    Opioid Risk Interpretation  Low Risk      ORT Scoring interpretation table:  Score <3 = Low Risk for SUD  Score between 4-7 = Moderate Risk for SUD  Score >8 = High Risk for Opioid Abuse   PHQ-2 Depression Scale:  Total score: 0  PHQ-2 Scoring interpretation table: (Score and probability of major depressive disorder)  Score 0 = No depression  Score 1 = 15.4% Probability  Score 2 = 21.1% Probability  Score 3 = 38.4% Probability  Score 4 = 45.5% Probability  Score 5 = 56.4% Probability  Score 6 = 78.6% Probability   PHQ-9 Depression Scale:  Total score: 0  PHQ-9 Scoring interpretation table:  Score 0-4 = No depression  Score 5-9 = Mild depression  Score 10-14 = Moderate depression  Score 15-19 =  Moderately severe depression  Score 20-27 = Severe depression (2.4 times higher risk of SUD and 2.89 times higher risk of overuse)   Pharmacologic Plan: As per protocol, I have not taken over any controlled substance management, pending the results of ordered tests and/or consults.            Initial impression: Pending review of available data and ordered tests.  Meds   Current Outpatient Medications:  .  albuterol (PROVENTIL HFA;VENTOLIN HFA) 108 (90 Base) MCG/ACT inhaler, Inhale 2 puffs into the lungs every 6 (six) hours as needed for wheezing or shortness of breath., Disp: 1 Inhaler, Rfl: 5 .  albuterol (PROVENTIL) (2.5 MG/3ML) 0.083% nebulizer solution, Take 3 mLs (2.5 mg total) by nebulization every 6 (six) hours as needed for Wheezing., Disp: 75 mL, Rfl: 3 .  aspirin 81 MG tablet, Take 1 tablet (81 mg total) by mouth daily., Disp: 30 tablet, Rfl: 11 .  atorvastatin (LIPITOR) 10 MG tablet, Take 1 tablet (10 mg total) by mouth daily., Disp: 90 tablet, Rfl: 1 .  carvedilol (COREG) 3.125 MG tablet, Take 1 tablet (3.125 mg total) by mouth daily., Disp: 90 tablet, Rfl: 1 .  Fluticasone-Salmeterol (ADVAIR) 100-50 MCG/DOSE AEPB, Inhale 1 puff into the lungs 2 (two) times daily., Disp: 1 each, Rfl: 3 .  furosemide (LASIX) 20 MG tablet, Take 20 mg by mouth daily. 1/2 tab, Disp: , Rfl:  .  gabapentin (NEURONTIN)  300 MG capsule, Take 2 capsules (600 mg total) by mouth 3 (three) times daily as needed (moderate pain)., Disp: 270 capsule, Rfl: 1 .  LORazepam (ATIVAN) 0.5 MG tablet, TAKE 1 TABLET BY MOUTH TWICE DAILY AS NEEDED FOR ANXIETY, Disp: 60 tablet, Rfl: 2 .  nitroGLYCERIN (NITROSTAT) 0.4 MG SL tablet, Place 1 tablet (0.4 mg total) under the tongue every 5 (five) minutes as needed for chest pain., Disp: 20 tablet, Rfl: 5 .  pantoprazole (PROTONIX) 40 MG tablet, TAKE 1 TABLET BY MOUTH ONCE DAILY, Disp: 90 tablet, Rfl: 3 .  phenytoin (DILANTIN) 100 MG ER capsule, Take 2 capsules (200 mg total) by  mouth at bedtime. Take 2 by mouth at bedtime, Disp: 180 capsule, Rfl: 1 .  sertraline (ZOLOFT) 100 MG tablet, Take 1 tablet (100 mg total) by mouth daily., Disp: 90 tablet, Rfl: 1 .  magnesium hydroxide (MILK OF MAGNESIA) 400 MG/5ML suspension, Take by mouth daily as needed for mild constipation., Disp: , Rfl:   Imaging Review   Results for orders placed during the hospital encounter of 01/28/17  DG Cervical Spine Complete   Narrative CLINICAL DATA:  Neck pain  EXAM: CERVICAL SPINE - COMPLETE 4+ VIEW  COMPARISON:  02/05/2016  FINDINGS: 4 mm anterolisthesis C4-5 unchanged. 3 mm anterolisthesis C5-6 and C6-7 unchanged.  Severe multilevel facet degeneration throughout the cervical spine. Disc degeneration and spurring C4-5 C5-6 and C6-7. Mild foraminal encroachment bilaterally C3-4 and C4-5.  Negative for fracture or mass.  IMPRESSION: Advanced facet degeneration with multilevel anterolisthesis. No acute abnormality and no change from the prior study.   Electronically Signed   By: Franchot Gallo M.D.   On: 01/29/2017 08:26    Shoulder-R DG:  Results for orders placed during the hospital encounter of 01/28/17  DG Shoulder Right   Narrative CLINICAL DATA:  Chronic right shoulder pain  EXAM: RIGHT SHOULDER - 2+ VIEW  COMPARISON:  None.  FINDINGS: Rotator cuff impingement with narrowing of the acromioclavicular distance and mild spurring of the distal acromion. Mild degenerative change in the glenohumeral joint and AC joint. Negative for fracture  IMPRESSION: Rotator cuff impingement and degenerative changes above.   Electronically Signed   By: Franchot Gallo M.D.   On: 01/29/2017 08:18      Results for orders placed during the hospital encounter of 06/08/16  DG Lumbar Spine Complete   Narrative CLINICAL DATA:  [place] 1 week ago, low back and RIGHT posterior hip pain, initial encounter  EXAM: LUMBAR SPINE - COMPLETE 4+ VIEW  COMPARISON:  None; correlation  CT abdomen and pelvis 03/09/2016  FINDINGS: Diffuse osseous demineralization.  Five non-rib-bearing lumbar vertebra.  Levoconvex scoliosis apex L3.  Multilevel degenerative disc disease changes with disc space narrowing and endplate spur formation.  Multilevel facet degenerative changes.  Vertebral body heights maintained without fracture or subluxation.  No gross evidence of spondylolysis.  SI joints preserved.  IMPRESSION: Osseous demineralization with multilevel degenerative disc and facet disease changes of the lumbar spine with levoconvex scoliosis.  No acute lumbar spine abnormalities.  Aortic atherosclerosis.   Electronically Signed   By: Lavonia Dana M.D.   On: 06/08/2016 13:39     Hip-R DG 2-3 views:  Results for orders placed during the hospital encounter of 06/08/16  DG HIP UNILAT WITH PELVIS 2-3 VIEWS RIGHT   Narrative CLINICAL DATA:  [place] 1 week ago, low back and RIGHT posterior hip pain, initial encounter  EXAM: DG HIP (WITH OR WITHOUT PELVIS) 2-3V RIGHT  COMPARISON:  None  FINDINGS: Diffuse osseous demineralization.  Mild narrowing of hip joints bilaterally.  No acute fracture, dislocation, or bone destruction.  Degenerative disc and facet disease changes at visualized lower lumbar spine.  Multiple surgical clips and phleboliths in pelvis.  Aortic atherosclerosis.  IMPRESSION: Marked osseous demineralization.  No acute bony abnormalities.  Aortic atherosclerosis.   Electronically Signed   By: Lavonia Dana M.D.   On: 06/08/2016 13:36    Hip-L DG 2-3 views:  Results for orders placed during the hospital encounter of 12/15/16  DG Hip Unilat W or Wo Pelvis 2-3 Views Left   Narrative CLINICAL DATA:  84 year old female with left hip pain. No known injury.  EXAM: DG HIP (WITH OR WITHOUT PELVIS) 2-3V LEFT  COMPARISON:  Abdominal CT dated 03/09/2016  FINDINGS: There is no acute fracture or dislocation. The bones are  osteopenic. There is osteoarthritic changes of the hips with moderate narrowing of the joint space. There degenerative changes of the lower lumbar spine. Multiple surgical clips noted over the pelvis. The soft tissues are grossly unremarkable.  IMPRESSION: 1. No acute fracture or dislocation. 2. Osteopenia with osteoarthritic changes of the hips.   Electronically Signed   By: Anner Crete M.D.   On: 12/15/2016 19:21     Knee-L DG 4 views:  Results for orders placed during the hospital encounter of 12/15/16  DG Knee Complete 4 Views Left   Narrative CLINICAL DATA:  84 year old female with left knee pain after walking. No known injury.  EXAM: LEFT KNEE - COMPLETE 4+ VIEW  COMPARISON:  None.  FINDINGS: There is no acute fracture or dislocation. The bones are osteopenic. There is moderate to advanced osteoarthritic changes of the knee with narrowing of the medial compartment, bone spurring seroma and meniscal chondrocalcinosis. Old healed fracture of the proximal fibula. There is a moderate to large suprapatellar effusion. Vascular calcifications noted. The soft tissues are grossly unremarkable.  IMPRESSION: 1. No acute fracture or dislocation. 2. Osteopenia and osteoarthritic changes. 3. Moderate suprapatellar effusion.   Electronically Signed   By: Anner Crete M.D.   On: 12/15/2016 21:08     Complexity Note: Imaging results reviewed. Results shared with Sandy Daniel, using Layman's terms.                         ROS  Cardiovascular History: Weak heart (CHF) Pulmonary or Respiratory History: Shortness of breath and Smoking Neurological History: Seizure disorder Review of Past Neurological Studies: No results found for this or any previous visit. Psychological-Psychiatric History: Prone to panicking and Difficulty sleeping and or falling asleep Gastrointestinal History: Irregular, infrequent bowel movements (Constipation) Genitourinary History: No  reported renal or genitourinary signs or symptoms such as difficulty voiding or producing urine, peeing blood, non-functioning kidney, kidney stones, difficulty emptying the bladder, difficulty controlling the flow of urine, or chronic kidney disease Hematological History: No reported hematological signs or symptoms such as prolonged bleeding, low or poor functioning platelets, bruising or bleeding easily, hereditary bleeding problems, low energy levels due to low hemoglobin or being anemic Endocrine History: No reported endocrine signs or symptoms such as high or low blood sugar, rapid heart rate due to high thyroid levels, obesity or weight gain due to slow thyroid or thyroid disease Rheumatologic History: Rheumatoid arthritis Musculoskeletal History: Negative for myasthenia gravis, muscular dystrophy, multiple sclerosis or malignant hyperthermia Work History: Retired  Allergies  Sandy Daniel is allergic to motrin [ibuprofen]; tolmetin; alendronate; other; and tramadol hcl.  Laboratory  Chemistry  Inflammation Markers (CRP: Acute Phase) (ESR: Chronic Phase) No results found for: CRP, ESRSEDRATE, LATICACIDVEN                       Rheumatology Markers No results found for: RF, ANA, LABURIC, URICUR, LYMEIGGIGMAB, LYMEABIGMQN              Renal Function Markers Lab Results  Component Value Date   BUN 22 (H) 07/12/2016   CREATININE 0.85 07/12/2016   GFRAA >60 07/12/2016   GFRNONAA >60 07/12/2016                 Hepatic Function Markers Lab Results  Component Value Date   AST 24 07/11/2016   ALT 18 07/11/2016   ALBUMIN 3.7 07/11/2016   ALKPHOS 175 (H) 07/11/2016   LIPASE 25 03/08/2016                 Electrolytes Lab Results  Component Value Date   NA 141 07/12/2016   K 3.7 07/12/2016   CL 107 07/12/2016   CALCIUM 8.6 (L) 07/12/2016   MG 2.0 07/11/2016                        Neuropathy Markers Lab Results  Component Value Date   VITAMINB12 309 03/26/2016   FOLATE 4.3  03/26/2016                 Bone Pathology Markers No results found for: [place], VD125OH2TOT, MB8466ZL9, JT7017BL3, 25OHVITD1, 25OHVITD2, 25OHVITD3, TESTOFREE, TESTOSTERONE                       Coagulation Parameters Lab Results  Component Value Date   PLT 226 07/12/2016                 Cardiovascular Markers Lab Results  Component Value Date   BNP 105.0 (H) 07/11/2016   TROPONINI <0.03 07/11/2016   HGB 11.5 (L) 07/12/2016   HCT 33.7 (L) 07/12/2016                 CA Markers No results found for: CEA, CA125, LABCA2               Note: Lab results reviewed.  [place]  Drug: Sandy Daniel  reports that she does not use drugs. Alcohol:  reports that she does not drink alcohol. Tobacco:  reports that she has been smoking cigarettes.  She has a 12.50 pack-year smoking history. she has never used smokeless tobacco. Medical:  has a past medical history of Arthritis, Asthma, B12 deficiency, CAD (coronary artery disease), COPD (chronic obstructive pulmonary disease) ([place]), Depression, Epilepsy ([place]), GERD (gastroesophageal reflux disease), Hyperlipidemia, Hypertension, Hypoxemia, Macular degeneration (senile) of retina, Macular degeneration of both eyes, MI (myocardial infarction) ([place]) (09/2003, 05/2013), Physiological tremor, Presence of stent of CABG, and Seizures ([place]). Family: family history includes Cancer in her brother and sister; Diabetes in her brother, brother, brother, mother, and sister; Heart attack in her brother and sister; Heart disease in her brother and sister; Hyperlipidemia in her brother and sister; Hypertension in her brother and sister; Kidney failure in her brother.  Past Surgical History:  Procedure Laterality Date  . ABDOMINAL HYSTERECTOMY    . APPENDECTOMY    . CARDIAC CATHETERIZATION  05/2013  . CARDIAC SURGERY    . CATARACT EXTRACTION  03/2016  . CHOLECYSTECTOMY  1962  . CORONARY ANGIOPLASTY WITH STENT PLACEMENT  Aug. 2005 and  March 2015  . ROTATOR CUFF REPAIR  Right   . VENTRAL HERNIA REPAIR  2002   Active Ambulatory Problems    Diagnosis Date Noted  . CAD (coronary artery disease)   . Seizure disorder ([place])   . Macular degeneration of both eyes   . B12 deficiency   . COPD (chronic obstructive pulmonary disease) ([place])   . HTN (hypertension)   . Hyperlipidemia   . Arthritis   . Depression   . GERD (gastroesophageal reflux disease)   . Dysphagia 03/26/2016  . Panic disorder 03/26/2016  . Controlled substance agreement signed 03/27/2016  . Physiological tremor 02/18/2014  . Anxiety 07/16/2016  . Chronic bilateral low back pain 07/16/2016  . Unstable gait 07/16/2016  . (HFpEF) heart failure with preserved ejection fraction ([place]) 09/11/2016  . Muscle spasm of back 10/22/2016  . Chronic bilateral thoracic back pain 12/04/2016  . Neck pain 01/31/2017  . Chronic right shoulder pain 01/31/2017   Resolved Ambulatory Problems    Diagnosis Date Noted  . Macular degeneration (senile) of retina   . MI (myocardial infarction) ([place])   . Dilantin toxicity 06/05/2013  . Hypoxemia 06/16/2013  . Immunization due 10/22/2011  . Hallucinations 06/07/2016  . COPD exacerbation ([place]) 07/11/2016   Past Medical History:  Diagnosis Date  . Arthritis   . Asthma   . B12 deficiency   . CAD (coronary artery disease)   . COPD (chronic obstructive pulmonary disease) ([place])   . Depression   . Epilepsy ([place])   . GERD (gastroesophageal reflux disease)   . Hyperlipidemia   . Hypertension   . Hypoxemia   . Macular degeneration (senile) of retina   . Macular degeneration of both eyes   . MI (myocardial infarction) ([place]) 09/2003, 05/2013  . Physiological tremor   . Presence of stent of CABG   . Seizures ([place])    Constitutional Exam  General appearance: alert, cachectic and pale Vitals:   04/11/17 1123  BP: (!) 166/76  Pulse: 61  Resp: 16  Temp: 98.7 F (37.1 C)  TempSrc: Oral  SpO2: 94%  Weight: 152 lb (68.9 kg)  Height: '4\' 11"'$  (1.499 m)   BMI  Assessment: Estimated body mass index is 30.7 kg/m as calculated from the following:   Height as of this encounter: '4\' 11"'$  (1.499 m).   Weight as of this encounter: 152 lb (68.9 kg).  BMI interpretation table: BMI level Category Range association with higher incidence of chronic pain  <18 kg/m2 Underweight   18.5-24.9 kg/m2 Ideal body weight   25-29.9 kg/m2 Overweight Increased incidence by 20%  30-34.9 kg/m2 Obese (Class I) Increased incidence by 68%  35-39.9 kg/m2 Severe obesity (Class II) Increased incidence by 136%  >40 kg/m2 Extreme obesity (Class III) Increased incidence by 254%   BMI Readings from Last 4 Encounters:  04/11/17 30.70 kg/m  01/28/17 28.98 kg/m  01/08/17 29.02 kg/m  12/15/16 28.51 kg/m   Wt Readings from Last 4 Encounters:  04/11/17 152 lb (68.9 kg)  01/28/17 148 lb 6.4 oz (67.3 kg)  01/08/17 148 lb 9.6 oz (67.4 kg)  12/15/16 146 lb (66.2 kg)  Psych/Mental status: Alert, oriented x 3 (person, place, & time)       Eyes: PERLA Respiratory: No evidence of acute respiratory distress  Cervical Spine Area Exam  Skin & Axial Inspection: No masses, redness, edema, swelling, or associated skin lesions Alignment: Symmetrical Functional ROM: Decreased ROM, bilaterally Stability: No instability detected Muscle Tone/Strength: Functionally intact. No obvious neuro-muscular anomalies  detected. Sensory (Neurological): Arthropathic arthralgia Palpation: Complains of area being tender to palpation Positive provocative maneuver for for cervical facet disease  Upper Extremity (UE) Exam    Side: Right upper extremity  Side: Left upper extremity  Skin & Extremity Inspection: Evidence of prior arthroplastic surgery  Skin & Extremity Inspection: Skin color, temperature, and hair growth are WNL. No peripheral edema or cyanosis. No masses, redness, swelling, asymmetry, or associated skin lesions. No contractures.  Functional ROM: Decreased ROM for shoulder  Functional ROM:  Unrestricted ROM          Muscle Tone/Strength: Functionally intact. No obvious neuro-muscular anomalies detected.  Muscle Tone/Strength: Functionally intact. No obvious neuro-muscular anomalies detected.  Sensory (Neurological): Arthropathic arthralgia          Sensory (Neurological): Unimpaired          Palpation: Complains of area being tender to palpation              Palpation: No palpable anomalies              Specialized Test(s): Deferred         Specialized Test(s): Deferred          Thoracic Spine Area Exam  Skin & Axial Inspection: No masses, redness, or swelling Alignment: Symmetrical Functional ROM: Decreased ROM Stability: No instability detected Muscle Tone/Strength: Functionally intact. No obvious neuro-muscular anomalies detected. Sensory (Neurological): Articular pain pattern Muscle strength & Tone: Complains of area being tender to palpation  Lumbar Spine Area Exam  Skin & Axial Inspection: No masses, redness, or swelling Alignment: Symmetrical Functional ROM: Decreased ROM      Stability: No instability detected Muscle Tone/Strength: Functionally intact. No obvious neuro-muscular anomalies detected. Sensory (Neurological): Articular pain pattern Palpation: Complains of area being tender to palpation       Provocative Tests: Lumbar Hyperextension and rotation test: Unable to perform due to pain. Lumbar Lateral bending test: Unable to perform due to pain. Patrick's Maneuver: evaluation deferred today                    Gait & Posture Assessment  Ambulation: Patient ambulates using a wheel chair Gait: Antalgic Posture: Difficulty standing up straight, due to pain   Lower Extremity Exam    Side: Right lower extremity  Side: Left lower extremity  Skin & Extremity Inspection: Skin color, temperature, and hair growth are WNL. No peripheral edema or cyanosis. No masses, redness, swelling, asymmetry, or associated skin lesions. No contractures.  Skin & Extremity  Inspection: Skin color, temperature, and hair growth are WNL. No peripheral edema or cyanosis. No masses, redness, swelling, asymmetry, or associated skin lesions. No contractures.  Functional ROM: Unrestricted ROM          Functional ROM: Unrestricted ROM          Muscle Tone/Strength: Functionally intact. No obvious neuro-muscular anomalies detected.  Muscle Tone/Strength: Functionally intact. No obvious neuro-muscular anomalies detected.  Sensory (Neurological): Unimpaired  Sensory (Neurological): Unimpaired  Palpation: No palpable anomalies  Palpation: No palpable anomalies   Assessment  Primary Diagnosis & Pertinent Problem List: The primary encounter diagnosis was Cervical spondylosis without myelopathy. Diagnoses of DDD (degenerative disc disease), cervical, Cervicalgia, Chronic bilateral low back pain with bilateral sciatica, Lumbar degenerative disc disease, Chronic pain syndrome, and Pain of both hip joints were also pertinent to this visit.  Visit Diagnosis (New problems to examiner): 1. Cervical spondylosis without myelopathy   2. DDD (degenerative disc disease), cervical   3. Cervicalgia  4. Chronic bilateral low back pain with bilateral sciatica   5. Lumbar degenerative disc disease   6. Chronic pain syndrome   7. Pain of both hip joints    General Recommendations: The pain condition that the patient suffers from is best treated with a multidisciplinary approach that involves an increase in physical activity to prevent de-conditioning and worsening of the pain cycle, as well as psychological counseling (formal and/or informal) to address the co-morbid psychological affects of pain. Treatment will often involve judicious use of pain medications and interventional procedures to decrease the pain, allowing the patient to participate in the physical activity that will ultimately produce long-lasting pain reductions. The goal of the multidisciplinary approach is to return the patient to  a higher level of overall function and to restore their ability to perform activities of daily living.  84 year old female who presents with multiple pain complaints including neck pain, right shoulder pain, thoracic pain, lower back pain, bilateral hip pain.  Patient's neck pain is secondary to cervical spondylosis and cervical degenerative disc disease most pronounced at C5, C6, C7 as seen on prior cervical spine x-rays.  Patient denies numbness and tingling radiating into her upper extremities.  She does have right shoulder osteoarthritis status post rotator cuff repair.  She is status post right shoulder cortisone injection which she finds helpful.  Patient is also endorsing mid and lower back pain.  We will obtain thoracic, lumbar x-rays along with bilateral SI joint x-rays to evaluate for her hip pain and groin pain that she also endorses.  For the patient's cervical spondylosis with physical exam findings suggestive of cervical facet disease, we discussed risks and benefits of diagnostic left cervical medial branch nerve blocks at C4, C5, C6, C7.  Patient would like to proceed.  In regards to medication management, notify the patient as well as her daughter that she must discontinue her Ativan to be considered for chronic opioid therapy.  Both patient and her daughter stated that they would discontinue the Ativan.  I will obtain a urine drug screen at the next visit which should be negative for Ativan at that time.  We can consider hydrocodone 5 mg daily to twice daily then.  Plan: -Left cervical facet medial branch nerve blocks at C4, C5, C6, C7 -Discontinue Ativan.  Find alternative medication that is not a benzodiazepine for management of anxiety.  Can discuss with her primary care physician -X-rays of thoracic spine, lumbar spine, SI joints to evaluate mid, low back as well as hip pain respectively  Future medication considerations: Hydrocodone 5 mg daily to twice daily as needed Future  interventional considerations: Cervical facet medial branch nerve blocks, cervical radio frequency ablation, thoracic facet blocks, lumbar facet medial branch nerve blocks, lumbar radio frequency ablation, bilateral SI joint injections, bilateral genicular nerve block.   Ordered Lab-work, Procedure(s), Referral(s), & Consult(s): Orders Placed This Encounter  Procedures  . CERVICAL FACET (MEDIAL BRANCH NERVE BLOCK)   . DG Lumbar Spine Complete W/Bend  . DG Si Joints  . DG Thoracic Spine 2 View   Pharmacotherapy (current): Medications ordered:  No orders of the defined types were placed in this encounter.  Medications administered during this visit: Sandy Daniel had no medications administered during this visit.   Pharmacological management options:  Opioid Analgesics: The patient was informed that there is no guarantee that she would be a candidate for opioid analgesics. The decision will be made following CDC guidelines. This decision will be based on the results of  diagnostic studies, as well as Sandy Daniel's risk profile.  Did not tolerate tramadol.  Resulted in nausea and vomiting.  Oxycodone resulted in nausea.  Did okay with hydrocodone.  Can consider low-dose hydrocodone.  Membrane stabilizer: To be determined at a later time, currently on gabapentin.  Can consider low-dose Lyrica in the future.  Avoid TCAs.  Muscle relaxant: Tried and failed resulted in excessive sedation  NSAID: To be determined at a later time  Other analgesic(s): To be determined at a later time   Interventional management options: Sandy Daniel was informed that there is no guarantee that she would be a candidate for interventional therapies. The decision will be based on the results of diagnostic studies, as well as Ms. Quaintance's risk profile.  Procedure(s) under consideration:  -Left cervical facet medial branch nerve blocks at C4, C5, C6, C7 -Occipital nerve blocks for occipital neuralgia -Thoracic  and lumbar facet medial branch nerve blocks -Bilateral SI joint injections -Bilateral genicular nerve block.   Provider-requested follow-up: Return in about 2 weeks (around 04/25/2017) for Procedure.  Future Appointments  Date Time Provider [place]  04/30/2017  1:40 PM Mikey College, NP [facility] None    Primary Care Physician: Mikey College, NP Location: [facility] Note by: Gillis Santa, M.D, Date: 04/11/2017; Time: 1:17 PM  Patient Instructions   1.  Please stop your lorazepam.  To be considered for opioid therapy at this clinic, you must be off of all benzodiazepines.  We will obtain a UDS at the next visit and this must be negative for benzodiazepines. 2.  We will schedule you for left C4, C5, C6, C7 cervical facet medial branch nerve block with sedation. 3.  We will also obtain x-rays of your thoracic, lumbar spine along with her SI joints.GENERAL RISKS AND COMPLICATIONS  What are the risk, side effects and possible complications? Generally speaking, most procedures are safe.  However, with any procedure there are risks, side effects, and the possibility of complications.  The risks and complications are dependent upon the sites that are lesioned, or the type of nerve block to be performed.  The closer the procedure is to the spine, the more serious the risks are.  Great care is taken when placing the radio frequency needles, block needles or lesioning probes, but sometimes complications can occur. 1. Infection: Any time there is an injection through the skin, there is a risk of infection.  This is why sterile conditions are used for these blocks.  There are four possible types of infection. 1. Localized skin infection. 2. Central Nervous System Infection-This can be in the form of Meningitis, which can be deadly. 3. Epidural Infections-This can be in the form of an epidural abscess, which can cause pressure inside of the spine,  causing compression of the spinal cord with subsequent paralysis. This would require an emergency surgery to decompress, and there are no guarantees that the patient would recover from the paralysis. 4. Discitis-This is an infection of the intervertebral discs.  It occurs in about 1% of discography procedures.  It is difficult to treat and it may lead to surgery.        2. Pain: the needles have to go through skin and soft tissues, will cause soreness.       3. Damage to internal structures:  The nerves to be lesioned may be near blood vessels or    other nerves which can be potentially damaged.  4. Bleeding: Bleeding is more common if the patient is taking blood thinners such as  aspirin, Coumadin, Ticiid, Plavix, etc., or if he/she have some genetic predisposition  such as hemophilia. Bleeding into the spinal canal can cause compression of the spinal  cord with subsequent paralysis.  This would require an emergency surgery to  decompress and there are no guarantees that the patient would recover from the  paralysis.       5. Pneumothorax:  Puncturing of a lung is a possibility, every time a needle is introduced in  the area of the chest or upper back.  Pneumothorax refers to free air around the  collapsed lung(s), inside of the thoracic cavity (chest cavity).  Another two possible  complications related to a similar event would include: Hemothorax and Chylothorax.   These are variations of the Pneumothorax, where instead of air around the collapsed  lung(s), you may have blood or chyle, respectively.       6. Spinal headaches: They may occur with any procedures in the area of the spine.       7. Persistent CSF (Cerebro-Spinal Fluid) leakage: This is a rare problem, but may occur  with prolonged intrathecal or epidural catheters either due to the formation of a fistulous  track or a dural tear.       8. Nerve damage: By working so close to the spinal cord, there is always a possibility of  nerve  damage, which could be as serious as a permanent spinal cord injury with  paralysis.       9. Death:  Although rare, severe deadly allergic reactions known as "Anaphylactic  reaction" can occur to any of the medications used.      10. Worsening of the symptoms:  We can always make thing worse.  What are the chances of something like this happening? Chances of any of this occuring are extremely low.  By statistics, you have more of a chance of getting killed in a motor vehicle accident: while driving to the hospital than any of the above occurring .  Nevertheless, you should be aware that they are possibilities.  In general, it is similar to taking a shower.  Everybody knows that you can slip, hit your head and get killed.  Does that mean that you should not shower again?  Nevertheless always keep in mind that statistics do not mean anything if you happen to be on the wrong side of them.  Even if a procedure has a 1 (one) in a 1,000,000 (million) chance of going wrong, it you happen to be that one..Also, keep in mind that by statistics, you have more of a chance of having something go wrong when taking medications.  Who should not have this procedure? If you are on a blood thinning medication (e.g. Coumadin, Plavix, see list of "Blood Thinners"), or if you have an active infection going on, you should not have the procedure.  If you are taking any blood thinners, please inform your physician.  How should I prepare for this procedure?  Do not eat or drink anything at least six hours prior to the procedure.  Bring a driver with you .  It cannot be a taxi.  Come accompanied by an adult that can drive you back, and that is strong enough to help you if your legs get weak or numb from the local anesthetic.  Take all of your medicines the morning of the procedure with just enough water to  swallow them.  If you have diabetes, make sure that you are scheduled to have your procedure done first thing in the  morning, whenever possible.  If you have diabetes, take only half of your insulin dose and notify our nurse that you have done so as soon as you arrive at the clinic.  If you are diabetic, but only take blood sugar pills (oral hypoglycemic), then do not take them on the morning of your procedure.  You may take them after you have had the procedure.  Do not take aspirin or any aspirin-containing medications, at least eleven (11) days prior to the procedure.  They may prolong bleeding.  Wear loose fitting clothing that may be easy to take off and that you would not mind if it got stained with Betadine or blood.  Do not wear any jewelry or perfume  Remove any nail coloring.  It will interfere with some of our monitoring equipment.  NOTE: Remember that this is not meant to be interpreted as a complete list of all possible complications.  Unforeseen problems may occur.  BLOOD THINNERS The following drugs contain aspirin or other products, which can cause increased bleeding during surgery and should not be taken for 2 weeks prior to and 1 week after surgery.  If you should need take something for relief of minor pain, you may take acetaminophen which is found in Tylenol,m Datril, Anacin-3 and Panadol. It is not blood thinner. The products listed below are.  Do not take any of the products listed below in addition to any listed on your instruction sheet.  A.P.C or A.P.C with Codeine Codeine Phosphate Capsules #3 Ibuprofen Ridaura  ABC compound Congesprin Imuran rimadil  Advil Cope Indocin Robaxisal  Alka-Seltzer Effervescent Pain Reliever and Antacid Coricidin or Coricidin-D  Indomethacin Rufen  Alka-Seltzer plus Cold Medicine Cosprin Ketoprofen S-A-C Tablets  Anacin Analgesic Tablets or Capsules Coumadin Korlgesic Salflex  Anacin Extra Strength Analgesic tablets or capsules CP-2 Tablets Lanoril Salicylate  Anaprox Cuprimine Capsules Levenox Salocol  Anexsia-D Dalteparin Magan Salsalate   Anodynos Darvon compound Magnesium Salicylate Sine-off  Ansaid Dasin Capsules Magsal Sodium Salicylate  Anturane Depen Capsules Marnal Soma  APF Arthritis pain formula Dewitt's Pills Measurin Stanback  Argesic Dia-Gesic Meclofenamic Sulfinpyrazone  Arthritis Bayer Timed Release Aspirin Diclofenac Meclomen Sulindac  Arthritis pain formula Anacin Dicumarol Medipren Supac  Analgesic (Safety coated) Arthralgen Diffunasal Mefanamic Suprofen  Arthritis Strength Bufferin Dihydrocodeine Mepro Compound Suprol  Arthropan liquid Dopirydamole Methcarbomol with Aspirin Synalgos  ASA tablets/Enseals Disalcid Micrainin Tagament  Ascriptin Doan's Midol Talwin  Ascriptin A/D Dolene Mobidin Tanderil  Ascriptin Extra Strength Dolobid Moblgesic Ticlid  Ascriptin with Codeine Doloprin or Doloprin with Codeine Momentum Tolectin  Asperbuf Duoprin Mono-gesic Trendar  Aspergum Duradyne Motrin or Motrin IB Triminicin  Aspirin plain, buffered or enteric coated Durasal Myochrisine Trigesic  Aspirin Suppositories Easprin Nalfon Trillsate  Aspirin with Codeine Ecotrin Regular or Extra Strength Naprosyn Uracel  Atromid-S Efficin Naproxen Ursinus  Auranofin Capsules Elmiron Neocylate Vanquish  Axotal Emagrin Norgesic Verin  Azathioprine Empirin or Empirin with Codeine Normiflo Vitamin E  Azolid Emprazil Nuprin Voltaren  Bayer Aspirin plain, buffered or children's or timed BC Tablets or powders Encaprin Orgaran Warfarin Sodium  Buff-a-Comp Enoxaparin Orudis Zorpin  Buff-a-Comp with Codeine Equegesic Os-Cal-Gesic   Buffaprin Excedrin plain, buffered or Extra Strength Oxalid   Bufferin Arthritis Strength Feldene Oxphenbutazone   Bufferin plain or Extra Strength Feldene Capsules Oxycodone with Aspirin   Bufferin with Codeine Fenoprofen Fenoprofen Pabalate or Pabalate-SF  Buffets II Flogesic Panagesic   Buffinol plain or Extra Strength Florinal or Florinal with Codeine Panwarfarin   Buf-Tabs Flurbiprofen  Penicillamine   Butalbital Compound Four-way cold tablets Penicillin   Butazolidin Fragmin Pepto-Bismol   Carbenicillin Geminisyn Percodan   Carna Arthritis Reliever Geopen Persantine   Carprofen Gold's salt Persistin   Chloramphenicol Goody's Phenylbutazone   Chloromycetin Haltrain Piroxlcam   Clmetidine heparin Plaquenil   Cllnoril Hyco-pap Ponstel   Clofibrate Hydroxy chloroquine Propoxyphen         Before stopping any of these medications, be sure to consult the physician who ordered them.  Some, such as Coumadin (Warfarin) are ordered to prevent or treat serious conditions such as "deep thrombosis", "pumonary embolisms", and other heart problems.  The amount of time that you may need off of the medication may also vary with the medication and the reason for which you were taking it.  If you are taking any of these medications, please make sure you notify your pain physician before you undergo any procedures.         Pain Management Discharge Instructions  General Discharge Instructions :  If you need to reach your doctor call: Monday-Friday 8:00 am - 4:00 pm at [phone] or toll free [phone].  After clinic hours [phone] to have operator reach doctor.  Bring all of your medication bottles to all your appointments in the pain clinic.  To cancel or reschedule your appointment with Pain Management please remember to call 24 hours in advance to avoid a fee.  Refer to the educational materials which you have been given on: General Risks, I had my Procedure. Discharge Instructions, Post Sedation.  Post Procedure Instructions:  The drugs you were given will stay in your system until tomorrow, so for the next 24 hours you should not drive, make any legal decisions or drink any alcoholic beverages.  You may eat anything you prefer, but it is better to start with liquids then soups and crackers, and gradually work up to solid foods.  Please notify your doctor  immediately if you have any unusual bleeding, trouble breathing or pain that is not related to your normal pain.  Depending on the type of procedure that was done, some parts of your body may feel week and/or numb.  This usually clears up by tonight or the next day.  Walk with the use of an assistive device or accompanied by an adult for the 24 hours.  You may use ice on the affected area for the first 24 hours.  Put ice in a Ziploc bag and cover with a towel and place against area 15 minutes on 15 minutes off.  You may switch to heat after 24 hours. Facet Joint Block The facet joints connect the bones of the spine (vertebrae). They make it possible for you to bend, twist, and make other movements with your spine. They also keep you from bending too far, twisting too far, and making other excessive movements. A facet joint block is a procedure where a numbing medicine (anesthetic) is injected into a facet joint. Often, a type of anti-inflammatory medicine called a steroid is also injected. A facet joint block may be done to diagnose neck or back pain. If the pain gets better after a facet joint block, it means the pain is probably coming from the facet joint. If the pain does not get better, it means the pain is probably not coming from the facet joint. A facet joint block may also be  done to relieve neck or back pain caused by an inflamed facet joint. A facet joint block is only done to relieve pain if the pain does not improve with other methods, such as medicine, exercise programs, and physical therapy. Tell a health care provider about:  Any allergies you have.  All medicines you are taking, including vitamins, herbs, eye drops, creams, and over-the-counter medicines.  Any problems you or family members have had with anesthetic medicines.  Any blood disorders you have.  Any surgeries you have had.  Any medical conditions you have.  Whether you are pregnant or may be pregnant. What are  the risks? Generally, this is a safe procedure. However, problems may occur, including:  Bleeding.  Injury to a nerve near the injection site.  Pain at the injection site.  Weakness or numbness in areas controlled by nerves near the injection site.  Infection.  Temporary fluid retention.  Allergic reactions to medicines or dyes.  Injury to other structures or organs near the injection site.  What happens before the procedure?  Follow instructions from your health care provider about eating or drinking restrictions.  Ask your health care provider about: ? Changing or stopping your regular medicines. This is especially important if you are taking diabetes medicines or blood thinners. ? Taking medicines such as aspirin and ibuprofen. These medicines can thin your blood. Do not take these medicines before your procedure if your health care provider instructs you not to.  Do not take any new dietary supplements or medicines without asking your health care provider first.  Plan to have someone take you home after the procedure. What happens during the procedure?  You may need to remove your clothing and dress in an open-back gown.  The procedure will be done while you are lying on an X-ray table. You will most likely be asked to lie on your stomach, but you may be asked to lie in a different position if an injection will be made in your neck.  Machines will be used to monitor your oxygen levels, heart rate, and blood pressure.  If an injection will be made in your neck, an IV tube will be inserted into one of your veins. Fluids and medicine will flow directly into your body through the IV tube.  The area over the facet joint where the injection will be made will be cleaned with soap. The surrounding skin will be covered with clean drapes.  A numbing medicine (local anesthetic) will be applied to your skin. Your skin may sting or burn for a moment.  A video X-ray machine  (fluoroscopy) will be used to locate the joint. In some cases, a CT scan may be used.  A contrast dye may be injected into the facet joint area to help locate the joint.  When the joint is located, an anesthetic will be injected into the joint through the needle.  Your health care provider will ask you whether you feel pain relief. If you do feel relief, a steroid may be injected to provide pain relief for a longer period of time. If you do not feel relief or feel only partial relief, additional injections of an anesthetic may be made in other facet joints.  The needle will be removed.  Your skin will be cleaned.  A bandage (dressing) will be applied over each injection site. The procedure may vary among health care providers and hospitals. What happens after the procedure?  You will be observed for 15-30 minutes  before being allowed to go home. This information is not intended to replace advice given to you by your health care provider. Make sure you discuss any questions you have with your health care provider. Document Released: 06/20/2006 Document Revised: 03/02/2015 Document Reviewed: 10/25/2014 Elsevier Interactive Patient Education  [employer].

## 2017-04-11 NOTE — Patient Instructions (Addendum)
1.  Please stop your lorazepam.  To be considered for opioid therapy at this clinic, you must be off of all benzodiazepines.  We will obtain a UDS at the next visit and this must be negative for benzodiazepines. 2.  We will schedule you for left C4, C5, C6, C7 cervical facet medial branch nerve block with sedation. 3.  We will also obtain x-rays of your thoracic, lumbar spine along with her SI joints.GENERAL RISKS AND COMPLICATIONS  What are the risk, side effects and possible complications? Generally speaking, most procedures are safe.  However, with any procedure there are risks, side effects, and the possibility of complications.  The risks and complications are dependent upon the sites that are lesioned, or the type of nerve block to be performed.  The closer the procedure is to the spine, the more serious the risks are.  Great care is taken when placing the radio frequency needles, block needles or lesioning probes, but sometimes complications can occur. 1. Infection: Any time there is an injection through the skin, there is a risk of infection.  This is why sterile conditions are used for these blocks.  There are four possible types of infection. 1. Localized skin infection. 2. Central Nervous System Infection-This can be in the form of Meningitis, which can be deadly. 3. Epidural Infections-This can be in the form of an epidural abscess, which can cause pressure inside of the spine, causing compression of the spinal cord with subsequent paralysis. This would require an emergency surgery to decompress, and there are no guarantees that the patient would recover from the paralysis. 4. Discitis-This is an infection of the intervertebral discs.  It occurs in about 1% of discography procedures.  It is difficult to treat and it may lead to surgery.        2. Pain: the needles have to go through skin and soft tissues, will cause soreness.       3. Damage to internal structures:  The nerves to be lesioned  may be near blood vessels or    other nerves which can be potentially damaged.       4. Bleeding: Bleeding is more common if the patient is taking blood thinners such as  aspirin, Coumadin, Ticiid, Plavix, etc., or if he/she have some genetic predisposition  such as hemophilia. Bleeding into the spinal canal can cause compression of the spinal  cord with subsequent paralysis.  This would require an emergency surgery to  decompress and there are no guarantees that the patient would recover from the  paralysis.       5. Pneumothorax:  Puncturing of a lung is a possibility, every time a needle is introduced in  the area of the chest or upper back.  Pneumothorax refers to free air around the  collapsed lung(s), inside of the thoracic cavity (chest cavity).  Another two possible  complications related to a similar event would include: Hemothorax and Chylothorax.   These are variations of the Pneumothorax, where instead of air around the collapsed  lung(s), you may have blood or chyle, respectively.       6. Spinal headaches: They may occur with any procedures in the area of the spine.       7. Persistent CSF (Cerebro-Spinal Fluid) leakage: This is a rare problem, but may occur  with prolonged intrathecal or epidural catheters either due to the formation of a fistulous  track or a dural tear.       8. Nerve damage:  By working so close to the spinal cord, there is always a possibility of  nerve damage, which could be as serious as a permanent spinal cord injury with  paralysis.       9. Death:  Although rare, severe deadly allergic reactions known as "Anaphylactic  reaction" can occur to any of the medications used.      10. Worsening of the symptoms:  We can always make thing worse.  What are the chances of something like this happening? Chances of any of this occuring are extremely low.  By statistics, you have more of a chance of getting killed in a motor vehicle accident: while driving to the hospital than  any of the above occurring .  Nevertheless, you should be aware that they are possibilities.  In general, it is similar to taking a shower.  Everybody knows that you can slip, hit your head and get killed.  Does that mean that you should not shower again?  Nevertheless always keep in mind that statistics do not mean anything if you happen to be on the wrong side of them.  Even if a procedure has a 1 (one) in a 1,000,000 (million) chance of going wrong, it you happen to be that one..Also, keep in mind that by statistics, you have more of a chance of having something go wrong when taking medications.  Who should not have this procedure? If you are on a blood thinning medication (e.g. Coumadin, Plavix, see list of "Blood Thinners"), or if you have an active infection going on, you should not have the procedure.  If you are taking any blood thinners, please inform your physician.  How should I prepare for this procedure?  Do not eat or drink anything at least six hours prior to the procedure.  Bring a driver with you .  It cannot be a taxi.  Come accompanied by an adult that can drive you back, and that is strong enough to help you if your legs get weak or numb from the local anesthetic.  Take all of your medicines the morning of the procedure with just enough water to swallow them.  If you have diabetes, make sure that you are scheduled to have your procedure done first thing in the morning, whenever possible.  If you have diabetes, take only half of your insulin dose and notify our nurse that you have done so as soon as you arrive at the clinic.  If you are diabetic, but only take blood sugar pills (oral hypoglycemic), then do not take them on the morning of your procedure.  You may take them after you have had the procedure.  Do not take aspirin or any aspirin-containing medications, at least eleven (11) days prior to the procedure.  They may prolong bleeding.  Wear loose fitting clothing that  may be easy to take off and that you would not mind if it got stained with Betadine or blood.  Do not wear any jewelry or perfume  Remove any nail coloring.  It will interfere with some of our monitoring equipment.  NOTE: Remember that this is not meant to be interpreted as a complete list of all possible complications.  Unforeseen problems may occur.  BLOOD THINNERS The following drugs contain aspirin or other products, which can cause increased bleeding during surgery and should not be taken for 2 weeks prior to and 1 week after surgery.  If you should need take something for relief of minor pain, you may  take acetaminophen which is found in Tylenol,m Datril, Anacin-3 and Panadol. It is not blood thinner. The products listed below are.  Do not take any of the products listed below in addition to any listed on your instruction sheet.  A.P.C or A.P.C with Codeine Codeine Phosphate Capsules #3 Ibuprofen Ridaura  ABC compound Congesprin Imuran rimadil  Advil Cope Indocin Robaxisal  Alka-Seltzer Effervescent Pain Reliever and Antacid Coricidin or Coricidin-D  Indomethacin Rufen  Alka-Seltzer plus Cold Medicine Cosprin Ketoprofen S-A-C Tablets  Anacin Analgesic Tablets or Capsules Coumadin Korlgesic Salflex  Anacin Extra Strength Analgesic tablets or capsules CP-2 Tablets Lanoril Salicylate  Anaprox Cuprimine Capsules Levenox Salocol  Anexsia-D Dalteparin Magan Salsalate  Anodynos Darvon compound Magnesium Salicylate Sine-off  Ansaid Dasin Capsules Magsal Sodium Salicylate  Anturane Depen Capsules Marnal Soma  APF Arthritis pain formula Dewitt's Pills Measurin Stanback  Argesic Dia-Gesic Meclofenamic Sulfinpyrazone  Arthritis Bayer Timed Release Aspirin Diclofenac Meclomen Sulindac  Arthritis pain formula Anacin Dicumarol Medipren Supac  Analgesic (Safety coated) Arthralgen Diffunasal Mefanamic Suprofen  Arthritis Strength Bufferin Dihydrocodeine Mepro Compound Suprol  Arthropan liquid  Dopirydamole Methcarbomol with Aspirin Synalgos  ASA tablets/Enseals Disalcid Micrainin Tagament  Ascriptin Doan's Midol Talwin  Ascriptin A/D Dolene Mobidin Tanderil  Ascriptin Extra Strength Dolobid Moblgesic Ticlid  Ascriptin with Codeine Doloprin or Doloprin with Codeine Momentum Tolectin  Asperbuf Duoprin Mono-gesic Trendar  Aspergum Duradyne Motrin or Motrin IB Triminicin  Aspirin plain, buffered or enteric coated Durasal Myochrisine Trigesic  Aspirin Suppositories Easprin Nalfon Trillsate  Aspirin with Codeine Ecotrin Regular or Extra Strength Naprosyn Uracel  Atromid-S Efficin Naproxen Ursinus  Auranofin Capsules Elmiron Neocylate Vanquish  Axotal Emagrin Norgesic Verin  Azathioprine Empirin or Empirin with Codeine Normiflo Vitamin E  Azolid Emprazil Nuprin Voltaren  Bayer Aspirin plain, buffered or children's or timed BC Tablets or powders Encaprin Orgaran Warfarin Sodium  Buff-a-Comp Enoxaparin Orudis Zorpin  Buff-a-Comp with Codeine Equegesic Os-Cal-Gesic   Buffaprin Excedrin plain, buffered or Extra Strength Oxalid   Bufferin Arthritis Strength Feldene Oxphenbutazone   Bufferin plain or Extra Strength Feldene Capsules Oxycodone with Aspirin   Bufferin with Codeine Fenoprofen Fenoprofen Pabalate or Pabalate-SF   Buffets II Flogesic Panagesic   Buffinol plain or Extra Strength Florinal or Florinal with Codeine Panwarfarin   Buf-Tabs Flurbiprofen Penicillamine   Butalbital Compound Four-way cold tablets Penicillin   Butazolidin Fragmin Pepto-Bismol   Carbenicillin Geminisyn Percodan   Carna Arthritis Reliever Geopen Persantine   Carprofen Gold's salt Persistin   Chloramphenicol Goody's Phenylbutazone   Chloromycetin Haltrain Piroxlcam   Clmetidine heparin Plaquenil   Cllnoril Hyco-pap Ponstel   Clofibrate Hydroxy chloroquine Propoxyphen         Before stopping any of these medications, be sure to consult the physician who ordered them.  Some, such as Coumadin (Warfarin)  are ordered to prevent or treat serious conditions such as "deep thrombosis", "pumonary embolisms", and other heart problems.  The amount of time that you may need off of the medication may also vary with the medication and the reason for which you were taking it.  If you are taking any of these medications, please make sure you notify your pain physician before you undergo any procedures.         Pain Management Discharge Instructions  General Discharge Instructions :  If you need to reach your doctor call: Monday-Friday 8:00 am - 4:00 pm at [phone] or toll free [phone].  After clinic hours [phone] to have operator reach doctor.  Bring all of your medication bottles  to all your appointments in the pain clinic.  To cancel or reschedule your appointment with Pain Management please remember to call 24 hours in advance to avoid a fee.  Refer to the educational materials which you have been given on: General Risks, I had my Procedure. Discharge Instructions, Post Sedation.  Post Procedure Instructions:  The drugs you were given will stay in your system until tomorrow, so for the next 24 hours you should not drive, make any legal decisions or drink any alcoholic beverages.  You may eat anything you prefer, but it is better to start with liquids then soups and crackers, and gradually work up to solid foods.  Please notify your doctor immediately if you have any unusual bleeding, trouble breathing or pain that is not related to your normal pain.  Depending on the type of procedure that was done, some parts of your body may feel week and/or numb.  This usually clears up by tonight or the next day.  Walk with the use of an assistive device or accompanied by an adult for the 24 hours.  You may use ice on the affected area for the first 24 hours.  Put ice in a Ziploc bag and cover with a towel and place against area 15 minutes on 15 minutes off.  You may switch to heat after 24  hours. Facet Joint Block The facet joints connect the bones of the spine (vertebrae). They make it possible for you to bend, twist, and make other movements with your spine. They also keep you from bending too far, twisting too far, and making other excessive movements. A facet joint block is a procedure where a numbing medicine (anesthetic) is injected into a facet joint. Often, a type of anti-inflammatory medicine called a steroid is also injected. A facet joint block may be done to diagnose neck or back pain. If the pain gets better after a facet joint block, it means the pain is probably coming from the facet joint. If the pain does not get better, it means the pain is probably not coming from the facet joint. A facet joint block may also be done to relieve neck or back pain caused by an inflamed facet joint. A facet joint block is only done to relieve pain if the pain does not improve with other methods, such as medicine, exercise programs, and physical therapy. Tell a health care provider about:  Any allergies you have.  All medicines you are taking, including vitamins, herbs, eye drops, creams, and over-the-counter medicines.  Any problems you or family members have had with anesthetic medicines.  Any blood disorders you have.  Any surgeries you have had.  Any medical conditions you have.  Whether you are pregnant or may be pregnant. What are the risks? Generally, this is a safe procedure. However, problems may occur, including:  Bleeding.  Injury to a nerve near the injection site.  Pain at the injection site.  Weakness or numbness in areas controlled by nerves near the injection site.  Infection.  Temporary fluid retention.  Allergic reactions to medicines or dyes.  Injury to other structures or organs near the injection site.  What happens before the procedure?  Follow instructions from your health care provider about eating or drinking restrictions.  Ask your  health care provider about: ? Changing or stopping your regular medicines. This is especially important if you are taking diabetes medicines or blood thinners. ? Taking medicines such as aspirin and ibuprofen. These medicines can  thin your blood. Do not take these medicines before your procedure if your health care provider instructs you not to.  Do not take any new dietary supplements or medicines without asking your health care provider first.  Plan to have someone take you home after the procedure. What happens during the procedure?  You may need to remove your clothing and dress in an open-back gown.  The procedure will be done while you are lying on an X-ray table. You will most likely be asked to lie on your stomach, but you may be asked to lie in a different position if an injection will be made in your neck.  Machines will be used to monitor your oxygen levels, heart rate, and blood pressure.  If an injection will be made in your neck, an IV tube will be inserted into one of your veins. Fluids and medicine will flow directly into your body through the IV tube.  The area over the facet joint where the injection will be made will be cleaned with soap. The surrounding skin will be covered with clean drapes.  A numbing medicine (local anesthetic) will be applied to your skin. Your skin may sting or burn for a moment.  A video X-ray machine (fluoroscopy) will be used to locate the joint. In some cases, a CT scan may be used.  A contrast dye may be injected into the facet joint area to help locate the joint.  When the joint is located, an anesthetic will be injected into the joint through the needle.  Your health care provider will ask you whether you feel pain relief. If you do feel relief, a steroid may be injected to provide pain relief for a longer period of time. If you do not feel relief or feel only partial relief, additional injections of an anesthetic may be made in other facet  joints.  The needle will be removed.  Your skin will be cleaned.  A bandage (dressing) will be applied over each injection site. The procedure may vary among health care providers and hospitals. What happens after the procedure?  You will be observed for 15-30 minutes before being allowed to go home. This information is not intended to replace advice given to you by your health care provider. Make sure you discuss any questions you have with your health care provider. Document Released: 06/20/2006 Document Revised: 03/02/2015 Document Reviewed: 10/25/2014 Elsevier Interactive Patient Education  [employer].

## 2017-04-11 NOTE — Progress Notes (Signed)
Safety precautions to be maintained throughout the outpatient stay will include: orient to surroundings, keep bed in low position, maintain call bell within reach at all times, provide assistance with transfer out of bed and ambulation.  

## 2017-04-27 ENCOUNTER — Other Ambulatory Visit: Payer: Self-pay | Admitting: Family Medicine

## 2017-04-27 ENCOUNTER — Other Ambulatory Visit: Payer: Self-pay | Admitting: Nurse Practitioner

## 2017-04-27 DIAGNOSIS — F419 Anxiety disorder, unspecified: Secondary | ICD-10-CM

## 2017-04-27 DIAGNOSIS — M62830 Muscle spasm of back: Secondary | ICD-10-CM

## 2017-04-29 ENCOUNTER — Other Ambulatory Visit: Payer: Self-pay

## 2017-04-29 ENCOUNTER — Ambulatory Visit (HOSPITAL_BASED_OUTPATIENT_CLINIC_OR_DEPARTMENT_OTHER): Payer: Medicare HMO | Admitting: Student in an Organized Health Care Education/Training Program

## 2017-04-29 ENCOUNTER — Ambulatory Visit
Admission: RE | Admit: 2017-04-29 | Discharge: 2017-04-29 | Disposition: A | Discharge status: Home / Self Care | Payer: Medicare HMO | Source: Ambulatory Visit | Attending: Student in an Organized Health Care Education/Training Program | Admitting: Student in an Organized Health Care Education/Training Program

## 2017-04-29 ENCOUNTER — Encounter: Payer: Self-pay | Admitting: Student in an Organized Health Care Education/Training Program

## 2017-04-29 VITALS — BP 190/88 | HR 63 | Temp 98.0°F | Resp 14 | Ht 60.0 in | Wt 160.0 lb

## 2017-04-29 DIAGNOSIS — R262 Difficulty in walking, not elsewhere classified: Secondary | ICD-10-CM

## 2017-04-29 DIAGNOSIS — Z79891 Long term (current) use of opiate analgesic: Secondary | ICD-10-CM | POA: Diagnosis not present

## 2017-04-29 DIAGNOSIS — M47812 Spondylosis without myelopathy or radiculopathy, cervical region: Secondary | ICD-10-CM | POA: Insufficient documentation

## 2017-04-29 DIAGNOSIS — G894 Chronic pain syndrome: Secondary | ICD-10-CM

## 2017-04-29 DIAGNOSIS — M542 Cervicalgia: Secondary | ICD-10-CM | POA: Diagnosis present

## 2017-04-29 MED ORDER — LIDOCAINE HCL 1 % IJ SOLN
10.0000 mL | Freq: Once | INTRAMUSCULAR | Status: AC
  Administered 2017-04-29: 10 mL
  Filled 2017-04-29: qty 10

## 2017-04-29 MED ORDER — DEXAMETHASONE SODIUM PHOSPHATE 10 MG/ML IJ SOLN
10.0000 mg | Freq: Once | INTRAMUSCULAR | Status: AC
  Administered 2017-04-29: 10 mg
  Filled 2017-04-29: qty 1

## 2017-04-29 MED ORDER — ROPIVACAINE HCL 2 MG/ML IJ SOLN
10.0000 mL | Freq: Once | INTRAMUSCULAR | Status: AC
  Administered 2017-04-29: 10 mL
  Filled 2017-04-29: qty 10

## 2017-04-29 NOTE — Progress Notes (Signed)
Spoke with Lura Em, daughter in law, to let her know about social services consult that we ordered to try and get her some help at home.  While having this conversation she asked me about getting pain medication ordered for the patient since she has taken her off the lorazepam.    Spoke with DR Cherylann Ratel re; this plan and he states that was the conversation to go off lorazepam and consult PCP for alternative medication before consideration of opioid therapy.  Also, we would need to collect a urine today for UDS.  When I went to tell the daughter in law this information she had already left with patient.    At next visit she will need to have a uds and swallow studies performed d/t intemittent feeling of choking before Dr Cherylann Ratel will consider opioiod therapy d/t respiratory issues.

## 2017-04-29 NOTE — Patient Instructions (Signed)
We are working on your neck pain that is secondary to arthritis.  Please follow up with [name] regarding your swallow study. This needs to be done to look into the "choking sensation" you get in your neck. I will send a message to your primary care provider to let them know.

## 2017-04-29 NOTE — Progress Notes (Signed)
While assiting with sitting  patient up on procedure bed pt started stating she could shallow and was in pain. Pt was pointing to her neck and stated "it tightens up a lot." Dr Cherylann Ratel informed  And came to assess patient. O2 sats 96% on RA, Bp 187/82, NSR, A&o x3.  Sips of Water given per patients request. Patient was able to shallow and stated she was feeling better and did not hurt. Assist patient with dressing . Talked with daughter in law and informed her that a swallowing study was ordered  May 2018 and that she has not had that done. Instructed her to schedule an appt.  SS consult was ordered per daughter in laws request to see if she can receive any help with patient and her ADL's.

## 2017-04-29 NOTE — Progress Notes (Signed)
Patient's Name: Sandy Daniel  MRN: [id number]  Referring Provider: Edward Jolly, MD  DOB: 05-19-1928  PCP: Galen Manila, NP  DOS: 04/29/2017  Note by: Edward Jolly, MD  Service setting: Ambulatory outpatient  Specialty: Interventional Pain Management  Patient type: Established  Location: [facility] (AMB) Pain Management Facility  Visit type: Interventional Procedure   Primary Reason for Visit: Interventional Pain Management Treatment. CC: Neck Pain; Back Pain (lower back bilateral); and Headache  Procedure:       Anesthesia, Analgesia, Anxiolysis:  Type: Cervical Facet Medial Branch Block(s) #1  Primary Purpose: Diagnostic Region: Posterolateral cervical spine Level: C4, C5, C6, & C7 Medial Branch Level(s). Injecting these levels blocks the C4-5, C5-6, and C6-7 cervical facet joints. Laterality: Left-Sided Paraspinal  Type: Local Anesthesia Indication(s): Analgesia and Anxiety Route: Infiltration ([place]/IM) IV Access: Declined Sedation: Declined  Local Anesthetic: Lidocaine 1-2%   Indications: 1. Cervical spondylosis without myelopathy    Pain Score: Pre-procedure: 9 /10 Post-procedure: 0-No pain/10  Pre-op Assessment:  Sandy Daniel is a 84 y.o. (year old), female patient, seen today for interventional treatment. She  has a past surgical history that includes Cardiac surgery; Abdominal hysterectomy; Coronary angioplasty with stent (Aug. 2005 and March 2015); Rotator cuff repair (Right); Ventral hernia repair (2002); Cholecystectomy (1962); Appendectomy; Cardiac catheterization (05/2013); and Cataract extraction (03/2016). Sandy Daniel has a current medication list which includes the following prescription(s): albuterol, albuterol, aspirin, atorvastatin, carvedilol, fluticasone-salmeterol, furosemide, gabapentin, magnesium hydroxide, nitroglycerin, pantoprazole, phenytoin, and sertraline. Her primarily concern today is the Neck Pain; Back Pain (lower back bilateral); and  Headache  Initial Vital Signs:  Pulse Rate: 61 Temp: 98 F (36.7 C) Resp: 16 BP: (!) 176/62 SpO2: 93 %  BMI: Estimated body mass index is 31.25 kg/m as calculated from the following:   Height as of this encounter: 5' (1.524 m).   Weight as of this encounter: 160 lb (72.6 kg).  Risk Assessment: Allergies: Reviewed. She is allergic to motrin [ibuprofen]; tolmetin; alendronate; other; and tramadol hcl.  Allergy Precautions: None required Coagulopathies: Reviewed. None identified.  Blood-thinner therapy: None at this time Active Infection(s): Reviewed. None identified. Ms. Bluett is afebrile  Site Confirmation: Sandy Daniel was asked to confirm the procedure and laterality before marking the site Procedure checklist: Completed Consent: Before the procedure and under the influence of no sedative(s), amnesic(s), or anxiolytics, the patient was informed of the treatment options, risks and possible complications. To fulfill our ethical and legal obligations, as recommended by the American Medical Association's Code of Ethics, I have informed the patient of my clinical impression; the nature and purpose of the treatment or procedure; the risks, benefits, and possible complications of the intervention; the alternatives, including doing nothing; the risk(s) and benefit(s) of the alternative treatment(s) or procedure(s); and the risk(s) and benefit(s) of doing nothing. The patient was provided information about the general risks and possible complications associated with the procedure. These may include, but are not limited to: failure to achieve desired goals, infection, bleeding, organ or nerve damage, allergic reactions, paralysis, and death. In addition, the patient was informed of those risks and complications associated to Spine-related procedures, such as failure to decrease pain; infection (i.e.: Meningitis, epidural or intraspinal abscess); bleeding (i.e.: epidural hematoma, subarachnoid  hemorrhage, or any other type of intraspinal or peri-dural bleeding); organ or nerve damage (i.e.: Any type of peripheral nerve, nerve root, or spinal cord injury) with subsequent damage to sensory, motor, and/or autonomic systems, resulting in permanent pain, numbness, and/or weakness of one or  several areas of the body; allergic reactions; (i.e.: anaphylactic reaction); and/or death. Furthermore, the patient was informed of those risks and complications associated with the medications. These include, but are not limited to: allergic reactions (i.e.: anaphylactic or anaphylactoid reaction(s)); adrenal axis suppression; blood sugar elevation that in diabetics may result in ketoacidosis or comma; water retention that in patients with history of congestive heart failure may result in shortness of breath, pulmonary edema, and decompensation with resultant heart failure; weight gain; swelling or edema; medication-induced neural toxicity; particulate matter embolism and blood vessel occlusion with resultant organ, and/or nervous system infarction; and/or aseptic necrosis of one or more joints. Finally, the patient was informed that Medicine is not an exact science; therefore, there is also the possibility of unforeseen or unpredictable risks and/or possible complications that may result in a catastrophic outcome. The patient indicated having understood very clearly. We have given the patient no guarantees and we have made no promises. Enough time was given to the patient to ask questions, all of which were answered to the patient's satisfaction. Sandy Daniel has indicated that she wanted to continue with the procedure. Attestation: I, the ordering provider, attest that I have discussed with the patient the benefits, risks, side-effects, alternatives, likelihood of achieving goals, and potential problems during recovery for the procedure that I have provided informed consent. Date  Time: 04/29/2017  9:33  AM  Pre-Procedure Preparation:  Monitoring: As per clinic protocol. Respiration, ETCO2, SpO2, BP, heart rate and rhythm monitor placed and checked for adequate function Safety Precautions: Patient was assessed for positional comfort and pressure points before starting the procedure. Time-out: I initiated and conducted the "Time-out" before starting the procedure, as per protocol. The patient was asked to participate by confirming the accuracy of the "Time Out" information. Verification of the correct person, site, and procedure were performed and confirmed by me, the nursing staff, and the patient. "Time-out" conducted as per Joint Commission's Universal Protocol (UP.01.01.01). Time: 1038  Description of Procedure:       Position: Prone with head of the table raised to facilitate breathing. Laterality: Left Level: C4, C5, C6, & C7 Medial Branch Level(s). Area Prepped: Posterior Cervico-thoracic Region Prepping solution: ChloraPrep (2% chlorhexidine gluconate and 70% isopropyl alcohol) Safety Precautions: Aspiration looking for blood return was conducted prior to all injections. At no point did we inject any substances, as a needle was being advanced. Before injecting, the patient was told to immediately notify me if she was experiencing any new onset of "ringing in the ears, or metallic taste in the mouth". No attempts were made at seeking any paresthesias. Safe injection practices and needle disposal techniques used. Medications properly checked for expiration dates. SDV (single dose vial) medications used. After the completion of the procedure, all disposable equipment used was discarded in the proper designated medical waste containers. Local Anesthesia: Protocol guidelines were followed. The patient was positioned over the fluoroscopy table. The area was prepped in the usual manner. The time-out was completed. The target area was identified using fluoroscopy. A 12-in long, straight, sterile  hemostat was used with fluoroscopic guidance to locate the targets for each level blocked. Once located, the skin was marked with an approved surgical skin marker. Once all sites were marked, the skin (epidermis, dermis, and hypodermis), as well as deeper tissues (fat, connective tissue and muscle) were infiltrated with a small amount of a short-acting local anesthetic, loaded on a 10cc syringe with a 25G, 1.5-in  Needle. An appropriate amount of time was  allowed for local anesthetics to take effect before proceeding to the next step. Local Anesthetic: Lidocaine 1.0% The unused portion of the local anesthetic was discarded in the proper designated containers. Technical explanation of process:  C4 Medial Branch Nerve Block (MBB): The target area for the C4 dorsal medial articular branch is the lateral concave waist of the articular pillar of C4. Under fluoroscopic guidance, a Quincke needle was inserted until contact was made with os over the postero-lateral aspect of the articular pillar of C4 (target area). After negative aspiration for blood, 1 mL of the nerve block solution was injected without difficulty or complication. The needle was removed intact. C5 Medial Branch Nerve Block (MBB): The target area for the C5 dorsal medial articular branch is the lateral concave waist of the articular pillar of C5. Under fluoroscopic guidance, a Quincke needle was inserted until contact was made with os over the postero-lateral aspect of the articular pillar of C5 (target area). After negative aspiration for blood, 1 mL of the nerve block solution was injected without difficulty or complication. The needle was removed intact. C6 Medial Branch Nerve Block (MBB): The target area for the C6 dorsal medial articular branch is the lateral concave waist of the articular pillar of C6. Under fluoroscopic guidance, a Quincke needle was inserted until contact was made with os over the postero-lateral aspect of the articular pillar  of C6 (target area). After negative aspiration for blood, 1 mL of the nerve block solution was injected without difficulty or complication. The needle was removed intact. C7 Medial Branch Nerve Block (MBB): The target for the C7 dorsal medial articular branch lies on the superior-medial tip of the C7 transverse process. Under fluoroscopic guidance, a Quincke needle was inserted until contact was made with os over the postero-lateral aspect of the articular pillar of C7 (target area). After negative aspiration for blood, 1mL of the nerve block solution was injected without difficulty or complication. The needle was removed intact. Procedural Needles: 25-gauge, 3.5-inch, Quincke needles used for all levels. Nerve block solution: 4 cc solution made of 3 cc of 0.2% ropivacaine, 1 cc of Decadron 10 mg/cc.  1 cc injected at each medial branch nerve listed above.   Once the entire procedure was completed, the treated area was cleaned, making sure to leave some of the prepping solution back to take advantage of its long term bactericidal properties.  Vitals:   04/29/17 1045 04/29/17 1050 04/29/17 1100 04/29/17 1110  BP: (!) 201/82 (!) 193/86 (!) 192/86 (!) 190/88  Pulse: 67 65 64 63  Resp: 15 14 13 14   Temp:      TempSrc:      SpO2: 97% 96% 96% 97%  Weight:      Height:        Start Time: 1038 hrs. End Time: 1051 hrs.  Imaging Guidance (Spinal):  Type of Imaging Technique: Fluoroscopy Guidance (Spinal) Indication(s): Assistance in needle guidance and placement for procedures requiring needle placement in or near specific anatomical locations not easily accessible without such assistance. Exposure Time: Please see nurses notes. Contrast: None used. Fluoroscopic Guidance: I was personally present during the use of fluoroscopy. "Tunnel Vision Technique" used to obtain the best possible view of the target area. Parallax error corrected before commencing the procedure. "Direction-depth-direction"  technique used to introduce the needle under continuous pulsed fluoroscopy. Once target was reached, antero-posterior, oblique, and lateral fluoroscopic projection used confirm needle placement in all planes. Images permanently stored in EMR. Interpretation: No contrast injected.  I personally interpreted the imaging intraoperatively. Adequate needle placement confirmed in multiple planes. Permanent images saved into the patient's record.  Antibiotic Prophylaxis:   Anti-infectives (From admission, onward)   None     Indication(s): None identified  Post-operative Assessment:  Post-procedure Vital Signs:  Pulse Rate: 63 Temp: 98 F (36.7 C) Resp: 14 BP: (!) 190/88 SpO2: 97 %  EBL: None  Complications: No immediate post-treatment complications observed by team, or reported by patient.  Note: The patient tolerated the entire procedure well. A repeat set of vitals were taken after the procedure and the patient was kept under observation following institutional policy, for this type of procedure. Post-procedural neurological assessment was performed, showing return to baseline, prior to discharge. The patient was provided with post-procedure discharge instructions, including a section on how to identify potential problems. Should any problems arise concerning this procedure, the patient was given instructions to immediately contact us, at any time, without hesitation. In any case, we plan to contact the patient by telephone for a follow-up status report regarding this interventional procedure.  Comments:  No additional relevant information.  Plan of Care   Imaging Orders     DG C-Arm 1-60 Min-No Report Procedure Orders    No procedure(s) ordered today    Patient states that her neck pain is feeling better after the cervical facet steroid injections.  She states that she is having more frequent headaches that start in the posterior portion of her head and then travels superiorly and  anteriorly.  I discussed with the patient that this is likely coming from the patient's cervical degenerative disc disease and cervical facet arthropathy which we are addressing by cervical facet blocks.  Patient is also endorsing a choking sensation that has become more frequent over the last 4 weeks.  This could be related to anxiety since I recommended the patient discontinue her Lorazepam to be considered for chronic opioid therapy.  Patient also has COPD and respiratory disease with previous x-rays showing bibasilar atelectasis.  Her choking sensation was discussed with her primary care provider, [name] and a barium swallow was ordered however the patient has not had this done.  I recommended the patient follow back up with her primary care provider to discuss completing this barium swallow so we can further evaluate her "choking" sensation.  Since the patient has discontinued her Lorazepam, I will order a urine drug screen which should be negative for any benzodiazepine or opioid medications.  I will also provide a referral for social services to see if the patient's primary caregiver who is her daughter-in-law can get assistance since she is having difficulty providing care.  Orders Placed This Encounter  Procedures  . DG C-Arm 1-60 Min-No Report    Intraoperative interpretation by procedural physician at [facility].    Standing Status:   Standing    Number of Occurrences:   1    Order Specific Question:   Reason for exam:    Answer:   Assistance in needle guidance and placement for procedures requiring needle placement in or near specific anatomical locations not easily accessible without such assistance.  . Compliance Drug Analysis, Ur    Volume: 30 ml(s). Minimum 3 ml of urine is needed. Document temperature of fresh sample. Indications: Long term (current) use of opiate analgesic (Z79.891) Test#: [id number] (Comprehensive Profile)  . Ambulatory referral to Social Work     Referral Priority:   Routine    Referral Type:   Consultation  Referral Reason:   Specialty Services Required    Number of Visits Requested:   1    Medications ordered for procedure: Meds ordered this encounter  Medications  . lidocaine (XYLOCAINE) 1 % (with pres) injection 10 mL  . ropivacaine (PF) 2 mg/mL (0.2%) (NAROPIN) injection 10 mL  . dexamethasone (DECADRON) injection 10 mg   Medications administered: We administered lidocaine, ropivacaine (PF) 2 mg/mL (0.2%), and dexamethasone.  See the medical record for exact dosing, route, and time of administration.  New Prescriptions   No medications on file   Disposition: Discharge home  Discharge Date & Time: 04/29/2017; 1112 hrs.   Physician-requested Follow-up: Return in about 2 weeks (around 05/14/2017) for Post Procedure Evaluation.  Future Appointments  Date Time Provider Department Center  04/30/2017  1:40 PM Galen Manila, NP [facility] None  05/14/2017 10:30 AM Edward Jolly, MD [facility] None   Primary Care Physician: Galen Manila, NP Location: [facility] Note by: Edward Jolly, MD Date: 04/29/2017; Time: 11:23 AM  Disclaimer:  Medicine is not an exact science. The only guarantee in medicine is that nothing is guaranteed. It is important to note that the decision to proceed with this intervention was based on the information collected from the patient. The Data and conclusions were drawn from the patient's questionnaire, the interview, and the physical examination. Because the information was provided in large part by the patient, it cannot be guaranteed that it has not been purposely or unconsciously manipulated. Every effort has been made to obtain as much relevant data as possible for this evaluation. It is important to note that the conclusions that lead to this procedure are derived in large part from the available data. Always take into account that the treatment will also be  dependent on availability of resources and existing treatment guidelines, considered by other Pain Management Practitioners as being common knowledge and practice, at the time of the intervention. For Medico-Legal purposes, it is also important to point out that variation in procedural techniques and pharmacological choices are the acceptable norm. The indications, contraindications, technique, and results of the above procedure should only be interpreted and judged by a Board-Certified Interventional Pain Specialist with extensive familiarity and expertise in the same exact procedure and technique.

## 2017-04-30 ENCOUNTER — Telehealth: Payer: Self-pay | Admitting: *Deleted

## 2017-04-30 ENCOUNTER — Ambulatory Visit: Payer: Medicare HMO | Admitting: Nurse Practitioner

## 2017-04-30 NOTE — Telephone Encounter (Signed)
Attempted to call for post procedure follow-up. Message left. 

## 2017-05-01 ENCOUNTER — Telehealth: Payer: Self-pay | Admitting: Nurse Practitioner

## 2017-05-01 NOTE — Telephone Encounter (Signed)
-----   Message from Edward Jolly, MD sent at 04/29/2017 12:09 PM EDT ----- [name], I saw Sandy Daniel today and we tried left cervical facet blocks for her neck pain secondary to cervical spondylosis and degenerative disc disease. This is a diagnostic block but her neck pain was doing better when she left (we did it without sedation). She is also having posterior-dominant headaches which I think are secondary to occipital neuralgia and cervical facet disease and the facet injections that we're doing should hopefully help that.  She was concerned about her "choking sensation" which she states that become more apparent over the last month. This was present in May as well and I can see from your note that you ordered a barium swallow to work this up and even considered speech therapy but I don't think the patient got the barium swallow study done. I informed her to follow up with you to see if this study needs to be re ordered to get it done.  Her choking sensation over the last month could be due to anxiety since I recommended she wean of her Ativan since she was requesting opioid

## 2017-05-01 NOTE — Telephone Encounter (Signed)
Have called patient to followup about anxiety management and consider rescheduling an appointment to discuss treatment options. I had planned to address this at her appointment on 04/30/2017, but patient did not come to the appointment. -  No answer. Left message to return call.

## 2017-05-03 NOTE — Telephone Encounter (Signed)
Called again to discuss and no answer.  Left message to return call.  Will see pt on 05/14/2017 at next rescheduled appointment.

## 2017-05-12 ENCOUNTER — Other Ambulatory Visit: Payer: Self-pay | Admitting: Family Medicine

## 2017-05-14 ENCOUNTER — Other Ambulatory Visit: Payer: Self-pay

## 2017-05-14 ENCOUNTER — Ambulatory Visit
Payer: Medicare HMO | Attending: Student in an Organized Health Care Education/Training Program | Admitting: Student in an Organized Health Care Education/Training Program

## 2017-05-14 ENCOUNTER — Ambulatory Visit (INDEPENDENT_AMBULATORY_CARE_PROVIDER_SITE_OTHER): Payer: Medicare HMO | Admitting: Nurse Practitioner

## 2017-05-14 ENCOUNTER — Encounter: Payer: Self-pay | Admitting: Student in an Organized Health Care Education/Training Program

## 2017-05-14 ENCOUNTER — Encounter: Payer: Self-pay | Admitting: Nurse Practitioner

## 2017-05-14 VITALS — BP 154/57 | HR 57 | Temp 98.1°F | Resp 16 | Ht 60.0 in | Wt 149.0 lb

## 2017-05-14 VITALS — BP 137/64 | HR 65 | Temp 97.5°F | Ht 60.0 in | Wt 149.2 lb

## 2017-05-14 DIAGNOSIS — M5442 Lumbago with sciatica, left side: Secondary | ICD-10-CM | POA: Insufficient documentation

## 2017-05-14 DIAGNOSIS — I252 Old myocardial infarction: Secondary | ICD-10-CM | POA: Insufficient documentation

## 2017-05-14 DIAGNOSIS — G894 Chronic pain syndrome: Secondary | ICD-10-CM | POA: Insufficient documentation

## 2017-05-14 DIAGNOSIS — Z5181 Encounter for therapeutic drug level monitoring: Secondary | ICD-10-CM | POA: Insufficient documentation

## 2017-05-14 DIAGNOSIS — I2510 Atherosclerotic heart disease of native coronary artery without angina pectoris: Secondary | ICD-10-CM | POA: Diagnosis not present

## 2017-05-14 DIAGNOSIS — E538 Deficiency of other specified B group vitamins: Secondary | ICD-10-CM | POA: Insufficient documentation

## 2017-05-14 DIAGNOSIS — F419 Anxiety disorder, unspecified: Secondary | ICD-10-CM

## 2017-05-14 DIAGNOSIS — Z79899 Other long term (current) drug therapy: Secondary | ICD-10-CM | POA: Diagnosis not present

## 2017-05-14 DIAGNOSIS — J449 Chronic obstructive pulmonary disease, unspecified: Secondary | ICD-10-CM | POA: Diagnosis not present

## 2017-05-14 DIAGNOSIS — E785 Hyperlipidemia, unspecified: Secondary | ICD-10-CM | POA: Insufficient documentation

## 2017-05-14 DIAGNOSIS — F17210 Nicotine dependence, cigarettes, uncomplicated: Secondary | ICD-10-CM | POA: Insufficient documentation

## 2017-05-14 DIAGNOSIS — K219 Gastro-esophageal reflux disease without esophagitis: Secondary | ICD-10-CM | POA: Insufficient documentation

## 2017-05-14 DIAGNOSIS — Z955 Presence of coronary angioplasty implant and graft: Secondary | ICD-10-CM | POA: Insufficient documentation

## 2017-05-14 DIAGNOSIS — Z7982 Long term (current) use of aspirin: Secondary | ICD-10-CM | POA: Insufficient documentation

## 2017-05-14 DIAGNOSIS — F324 Major depressive disorder, single episode, in partial remission: Secondary | ICD-10-CM | POA: Diagnosis not present

## 2017-05-14 DIAGNOSIS — Z951 Presence of aortocoronary bypass graft: Secondary | ICD-10-CM | POA: Insufficient documentation

## 2017-05-14 DIAGNOSIS — M5441 Lumbago with sciatica, right side: Secondary | ICD-10-CM

## 2017-05-14 DIAGNOSIS — G40909 Epilepsy, unspecified, not intractable, without status epilepticus: Secondary | ICD-10-CM | POA: Diagnosis not present

## 2017-05-14 DIAGNOSIS — M5030 Other cervical disc degeneration, unspecified cervical region: Secondary | ICD-10-CM | POA: Insufficient documentation

## 2017-05-14 DIAGNOSIS — I10 Essential (primary) hypertension: Secondary | ICD-10-CM | POA: Insufficient documentation

## 2017-05-14 DIAGNOSIS — Z886 Allergy status to analgesic agent status: Secondary | ICD-10-CM | POA: Insufficient documentation

## 2017-05-14 DIAGNOSIS — M47812 Spondylosis without myelopathy or radiculopathy, cervical region: Secondary | ICD-10-CM | POA: Insufficient documentation

## 2017-05-14 DIAGNOSIS — G8929 Other chronic pain: Secondary | ICD-10-CM

## 2017-05-14 DIAGNOSIS — R262 Difficulty in walking, not elsewhere classified: Secondary | ICD-10-CM

## 2017-05-14 DIAGNOSIS — F329 Major depressive disorder, single episode, unspecified: Secondary | ICD-10-CM | POA: Diagnosis not present

## 2017-05-14 DIAGNOSIS — M542 Cervicalgia: Secondary | ICD-10-CM | POA: Diagnosis not present

## 2017-05-14 DIAGNOSIS — M5136 Other intervertebral disc degeneration, lumbar region: Secondary | ICD-10-CM | POA: Insufficient documentation

## 2017-05-14 MED ORDER — HYDROCODONE-ACETAMINOPHEN 5-325 MG PO TABS: 1.0000 | ORAL_TABLET | Freq: Two times a day (BID) | ORAL | 0 refills | Status: DC | PRN

## 2017-05-14 MED ORDER — BUSPIRONE HCL 7.5 MG PO TABS: 7.5000 mg | ORAL_TABLET | Freq: Two times a day (BID) | ORAL | 2 refills | Status: DC

## 2017-05-14 NOTE — Progress Notes (Signed)
Patient's Name: Sandy Daniel  MRN: [id number]  Referring Provider: Mikey College, *  DOB: December 29, 1928  PCP: Mikey College, NP  DOS: 05/14/2017  Note by: Gillis Santa, MD  Service setting: Ambulatory outpatient  Specialty: Interventional Pain Management  Location: [facility] (AMB) Pain Management Facility    Patient type: Established   Primary Reason(s) for Visit: Encounter for prescription drug management & post-procedure evaluation of chronic illness with mild to moderate exacerbation(Level of risk: moderate) CC: Neck Pain and Back Pain (low)  HPI  Sandy Daniel is a 84 y.o. year old, female patient, who comes today for a post-procedure evaluation and medication management. She has CAD (coronary artery disease); Seizure disorder ([place]); Macular degeneration of both eyes; B12 deficiency; COPD (chronic obstructive pulmonary disease) ([place]); HTN (hypertension); Hyperlipidemia; Arthritis; Depression; GERD (gastroesophageal reflux disease); Dysphagia; Panic disorder; Controlled substance agreement signed; Physiological tremor; Anxiety; Chronic bilateral low back pain; Unstable gait; (HFpEF) heart failure with preserved ejection fraction ([place]); Muscle spasm of back; Chronic bilateral thoracic back pain; Neck pain; and Chronic right shoulder pain on their problem list. Her primarily concern today is the Neck Pain and Back Pain (low)  Pain Assessment: Location:   Neck Radiating: radiates down spine Onset: More than a month ago Duration: Chronic pain Quality: Discomfort Severity:10/10 (self-reported pain score)  Note: Reported level is inconsistent with clinical observations. Clinically the patient looks like a 2/10             When using our objective Pain Scale, levels between 6 and 10/10 are said to belong in an emergency room, as it progressively worsens from a 6/10, described as severely limiting, requiring emergency care not usually available at an outpatient pain management facility. At a  6/10 level, communication becomes difficult and requires great effort. Assistance to reach the emergency department may be required. Facial flushing and profuse sweating along with potentially dangerous increases in heart rate and blood pressure will be evident. Effect on ADL:   Timing: Constant Modifying factors: denies  Sandy Daniel was last seen on 04/29/2017 for a procedure. During today's appointment we reviewed Sandy Daniel's post-procedure results, as well as her outpatient medication regimen.  Further details on both, my assessment(s), as well as the proposed treatment plan, please see below.  Controlled Substance Pharmacotherapy Assessment REMS (Risk Evaluation and Mitigation Strategy)  Analgesic: Norco 5 mg BID PRN moderate to severe pain MME/day: 10 mg/day.  Dewayne Shorter, RN  05/14/2017 10:32 AM  Signed Safety precautions to be maintained throughout the outpatient stay will include: orient to surroundings, keep bed in low position, maintain call bell within reach at all times, provide assistance with transfer out of bed and ambulation.   Pharmacokinetics: Liberation and absorption (onset of action): WNL Distribution (time to peak effect): WNL Metabolism and excretion (duration of action): WNL         Pharmacodynamics: Desired effects: Analgesia: Sandy Daniel reports >50% benefit. Functional ability: Patient reports that medication allows her to accomplish basic ADLs Clinically meaningful improvement in function (CMIF): Sustained CMIF goals met Perceived effectiveness: Described as relatively effective, allowing for increase in activities of daily living (ADL) Undesirable effects: Side-effects or Adverse reactions: None reported Monitoring: [place] PMP: Online review of the past 65-monthperiod conducted. Compliant with practice rules and regulations Last UDS on record: No results found for: SUMMARY UDS interpretation: today          Medication Assessment Form: Not applicable.  Initial evaluation. The patient has not received any medications from our  practice Treatment compliance: NA Risk Assessment Profile: Aberrant behavior: See prior evaluations. None observed or detected today Comorbid factors increasing risk of overdose: See prior notes. No additional risks detected today Risk of substance use disorder (SUD): Low Opioid Risk Tool - 04/11/17 1136      Family History of Substance Abuse   Alcohol  Negative    Illegal Drugs  Negative    Rx Drugs  Negative      Personal History of Substance Abuse   Alcohol  Negative    Illegal Drugs  Negative    Rx Drugs  Negative      Age   Age between 56-45 years   No      History of Preadolescent Sexual Abuse   History of Preadolescent Sexual Abuse  Negative or Female      Psychological Disease   Psychological Disease  Negative    Depression  Negative      Total Score   Opioid Risk Tool Scoring  0    Opioid Risk Interpretation  Low Risk      ORT Scoring interpretation table:  Score <3 = Low Risk for SUD  Score between 4-7 = Moderate Risk for SUD  Score >8 = High Risk for Opioid Abuse   Risk Mitigation Strategies:  Patient Counseling: Covered Patient-Prescriber Agreement (PPA): Present and active  Notification to other healthcare providers: Done  Pharmacologic Plan: Start Hydrocodone 5 mg BID prn, #60.month             Post-Procedure Assessment  04/29/2017 Procedure:  left C4, C5, C6, C7 cervical facets Pre-procedure pain score:  9/10 Post-procedure pain score: 0/10         Influential Factors: BMI: 29.10 kg/m Intra-procedural challenges: None observed.         Assessment challenges: None detected.              Reported side-effects: None.        Post-procedural adverse reactions or complications: None reported         Sedation: Please see nurses note. When no sedatives are used, the analgesic levels obtained are directly associated to the effectiveness of the local anesthetics. However, when sedation  is provided, the level of analgesia obtained during the initial 1 hour following the intervention, is believed to be the result of a combination of factors. These factors may include, but are not limited to: 1. The effectiveness of the local anesthetics used. 2. The effects of the analgesic(s) and/or anxiolytic(s) used. 3. The degree of discomfort experienced by the patient at the time of the procedure. 4. The patients ability and reliability in recalling and recording the events. 5. The presence and influence of possible secondary gains and/or psychosocial factors. Reported result: Relief experienced during the 1st hour after the procedure: 0 % (Ultra-Short Term Relief)            Interpretative annotation: Unexpected result. Analgesia during this period is likely to be Local Anesthetic and/or IV Sedative (Analgesic/Anxiolytic) related.          Effects of local anesthetic: The analgesic effects attained during this period are directly associated to the localized infiltration of local anesthetics and therefore cary significant diagnostic value as to the etiological location, or anatomical origin, of the pain. Expected duration of relief is directly dependent on the pharmacodynamics of the local anesthetic used. Long-acting (4-6 hours) anesthetics used.  Reported result: Relief during the next 4 to 6 hour after the procedure: 0 % (Short-Term Relief)  Interpretative annotation: Unexpected result. Analgesia during this period is likely to be Local Anesthetic-related.          Long-term benefit: Defined as the period of time past the expected duration of local anesthetics (1 hour for short-acting and 4-6 hours for long-acting). With the possible exception of prolonged sympathetic blockade from the local anesthetics, benefits during this period are typically attributed to, or associated with, other factors such as analgesic sensory neuropraxia, antiinflammatory effects, or beneficial biochemical  changes provided by agents other than the local anesthetics.  Reported result: Extended relief following procedure: 0 % (Long-Term Relief)            Interpretative annotation: Unexpected result. No benefit. Therapeutic failure. Pain appears to be refractory to this treatment modality.          Current benefits: Defined as reported results that persistent at this point in time.   Analgesia: 0 %            Function: No benefit ROM: No benefit Interpretative annotation: No benefit. Therapeutic failure. Results would argue against repeating therapy.          Interpretation: Results would suggest a successful diagnostic intervention.                  Plan:  Please see "Plan of Care" for details.                Laboratory Chemistry  Inflammation Markers (CRP: Acute Phase) (ESR: Chronic Phase) No results found for: CRP, ESRSEDRATE, LATICACIDVEN                       Rheumatology Markers No results found for: RF, ANA, LABURIC, URICUR, LYMEIGGIGMAB, LYMEABIGMQN                      Renal Function Markers Lab Results  Component Value Date   BUN 22 (H) 07/12/2016   CREATININE 0.85 07/12/2016   GFRAA >60 07/12/2016   GFRNONAA >60 07/12/2016                              Hepatic Function Markers Lab Results  Component Value Date   AST 24 07/11/2016   ALT 18 07/11/2016   ALBUMIN 3.7 07/11/2016   ALKPHOS 175 (H) 07/11/2016   LIPASE 25 03/08/2016                        Electrolytes Lab Results  Component Value Date   NA 141 07/12/2016   K 3.7 07/12/2016   CL 107 07/12/2016   CALCIUM 8.6 (L) 07/12/2016   MG 2.0 07/11/2016                        Neuropathy Markers Lab Results  Component Value Date   VITAMINB12 309 03/26/2016   FOLATE 4.3 03/26/2016                        Bone Pathology Markers No results found for: VD25OH, VD125OH2TOT, LT9030SP2, ZR0076AU6, 25OHVITD1, 25OHVITD2, 25OHVITD3, TESTOFREE, TESTOSTERONE                       Coagulation Parameters Lab Results   Component Value Date   PLT 226 07/12/2016  Cardiovascular Markers Lab Results  Component Value Date   BNP 105.0 (H) 07/11/2016   TROPONINI <0.03 07/11/2016   HGB 11.5 (L) 07/12/2016   HCT 33.7 (L) 07/12/2016                         CA Markers No results found for: CEA, CA125, LABCA2                      Note: Lab results reviewed.  Recent Diagnostic Imaging Results  DG C-Arm 1-60 Min-No Report Fluoroscopy was utilized by the requesting physician.  No radiographic  interpretation.   Complexity Note: Imaging results reviewed. Results shared with Sandy Daniel, using Layman's terms.                         Meds   Current Outpatient Medications:  .  albuterol (PROVENTIL HFA;VENTOLIN HFA) 108 (90 Base) MCG/ACT inhaler, Inhale 2 puffs into the lungs every 6 (six) hours as needed for wheezing or shortness of breath., Disp: 1 Inhaler, Rfl: 5 .  albuterol (PROVENTIL) (2.5 MG/3ML) 0.083% nebulizer solution, Take 3 mLs (2.5 mg total) by nebulization every 6 (six) hours as needed for Wheezing., Disp: 75 mL, Rfl: 3 .  aspirin 81 MG tablet, Take 1 tablet (81 mg total) by mouth daily., Disp: 30 tablet, Rfl: 11 .  atorvastatin (LIPITOR) 10 MG tablet, TAKE 1 TABLET BY MOUTH ONCE DAILY, Disp: 90 tablet, Rfl: 1 .  busPIRone (BUSPAR) 7.5 MG tablet, Take 1 tablet (7.5 mg total) by mouth 2 (two) times daily., Disp: 60 tablet, Rfl: 2 .  carvedilol (COREG) 3.125 MG tablet, TAKE 1 TABLET BY MOUTH ONCE DAILY, Disp: 90 tablet, Rfl: 1 .  Fluticasone-Salmeterol (ADVAIR) 100-50 MCG/DOSE AEPB, Inhale 1 puff into the lungs 2 (two) times daily., Disp: 1 each, Rfl: 3 .  furosemide (LASIX) 20 MG tablet, Take 20 mg by mouth daily. 1/2 tab, Disp: , Rfl:  .  gabapentin (NEURONTIN) 300 MG capsule, Take 2 capsules (600 mg total) by mouth 3 (three) times daily as needed (moderate pain)., Disp: 270 capsule, Rfl: 1 .  magnesium hydroxide (MILK OF MAGNESIA) 400 MG/5ML suspension, Take by mouth daily  as needed for mild constipation., Disp: , Rfl:  .  nitroGLYCERIN (NITROSTAT) 0.4 MG SL tablet, Place 1 tablet (0.4 mg total) under the tongue every 5 (five) minutes as needed for chest pain., Disp: 20 tablet, Rfl: 5 .  pantoprazole (PROTONIX) 40 MG tablet, TAKE 1 TABLET BY MOUTH ONCE DAILY, Disp: 90 tablet, Rfl: 3 .  phenytoin (DILANTIN) 100 MG ER capsule, Take 2 capsules (200 mg total) by mouth at bedtime. Take 2 by mouth at bedtime, Disp: 180 capsule, Rfl: 1 .  QUEtiapine (SEROQUEL) 50 MG tablet, Take 50 mg by mouth at bedtime., Disp: , Rfl:  .  sertraline (ZOLOFT) 100 MG tablet, Take 1 tablet (100 mg total) by mouth daily., Disp: 90 tablet, Rfl: 1 .  HYDROcodone-acetaminophen (NORCO/VICODIN) 5-325 MG tablet, Take 1 tablet by mouth 2 (two) times daily as needed for moderate pain., Disp: 60 tablet, Rfl: 0  ROS  Constitutional: Denies any fever or chills Gastrointestinal: No reported hemesis, hematochezia, vomiting, or acute GI distress Musculoskeletal: Denies any acute onset joint swelling, redness, loss of ROM, or weakness Neurological: No reported episodes of acute onset apraxia, aphasia, dysarthria, agnosia, amnesia, paralysis, loss of coordination, or loss of consciousness  Allergies  Sandy Daniel is allergic  to motrin [ibuprofen]; tolmetin; alendronate; other; and tramadol hcl.    Drug: Sandy Daniel  reports that she does not use drugs. Alcohol:  reports that she does not drink alcohol. Tobacco:  reports that she has been smoking cigarettes.  She has a 12.50 pack-year smoking history. She has never used smokeless tobacco. Medical:  has a past medical history of Arthritis, Asthma, B12 deficiency, CAD (coronary artery disease), COPD (chronic obstructive pulmonary disease) ([place]), Depression, Epilepsy ([place]), GERD (gastroesophageal reflux disease), Hyperlipidemia, Hypertension, Hypoxemia, Macular degeneration (senile) of retina, Macular degeneration of both eyes, MI (myocardial infarction)  ([place]) (09/2003, 05/2013), Physiological tremor, Presence of stent of CABG, and Seizures ([place]). Surgical: Sandy Daniel  has a past surgical history that includes Cardiac surgery; Abdominal hysterectomy; Coronary angioplasty with stent (Aug. 2005 and March 2015); Rotator cuff repair (Right); Ventral hernia repair (2002); Cholecystectomy (1962); Appendectomy; Cardiac catheterization (05/2013); and Cataract extraction (03/2016). Family: family history includes Cancer in her brother and sister; Diabetes in her brother, brother, brother, mother, and sister; Heart attack in her brother and sister; Heart disease in her brother and sister; Hyperlipidemia in her brother and sister; Hypertension in her brother and sister; Kidney failure in her brother.  Constitutional Exam  General appearance: Well nourished, well developed, and well hydrated. In no apparent acute distress Vitals:   05/14/17 1025 05/14/17 1026  BP:  (!) 154/57  Pulse:  (!) 57  Resp:  16  Temp:  98.1 F (36.7 C)  SpO2:  95%  Weight: 149 lb (67.6 kg)   Height: 5' (1.524 m)    BMI Assessment: Estimated body mass index is 29.1 kg/m as calculated from the following:   Height as of this encounter: 5' (1.524 m).   Weight as of this encounter: 149 lb (67.6 kg).  BMI interpretation table: BMI level Category Range association with higher incidence of chronic pain  <18 kg/m2 Underweight   18.5-24.9 kg/m2 Ideal body weight   25-29.9 kg/m2 Overweight Increased incidence by 20%  30-34.9 kg/m2 Obese (Class I) Increased incidence by 68%  35-39.9 kg/m2 Severe obesity (Class II) Increased incidence by 136%  >40 kg/m2 Extreme obesity (Class III) Increased incidence by 254%   BMI Readings from Last 4 Encounters:  05/14/17 29.10 kg/m  05/14/17 29.14 kg/m  04/29/17 31.25 kg/m  04/11/17 30.70 kg/m   Wt Readings from Last 4 Encounters:  05/14/17 149 lb (67.6 kg)  05/14/17 149 lb 3.2 oz (67.7 kg)  04/29/17 160 lb (72.6 kg)  04/11/17 152 lb  (68.9 kg)  Psych/Mental status: Alert, oriented x 3 (person, place, & time)       Eyes: PERLA Respiratory: No evidence of acute respiratory distress  Cervical Spine Area Exam  Skin & Axial Inspection: Paravertebral muscle atrophy Alignment: Symmetrical Functional ROM: Decreased ROM, bilaterally Stability: No instability detected Muscle Tone/Strength: Functionally intact. No obvious neuro-muscular anomalies detected. Sensory (Neurological): Movement-associated discomfort Palpation: Complains of area being tender to palpation              Upper Extremity (UE) Exam    Side: Right upper extremity  Side: Left upper extremity  Skin & Extremity Inspection: Skin color, temperature, and hair growth are WNL. No peripheral edema or cyanosis. No masses, redness, swelling, asymmetry, or associated skin lesions. No contractures.  Skin & Extremity Inspection: Skin color, temperature, and hair growth are WNL. No peripheral edema or cyanosis. No masses, redness, swelling, asymmetry, or associated skin lesions. No contractures.  Functional ROM: Unrestricted ROM  Functional ROM: Unrestricted ROM          Muscle Tone/Strength: Functionally intact. No obvious neuro-muscular anomalies detected.  Muscle Tone/Strength: Functionally intact. No obvious neuro-muscular anomalies detected.  Sensory (Neurological): Unimpaired          Sensory (Neurological): Unimpaired          Palpation: No palpable anomalies              Palpation: No palpable anomalies              Specialized Test(s): Deferred         Specialized Test(s): Deferred          Thoracic Spine Area Exam  Skin & Axial Inspection: No masses, redness, or swelling Alignment: Asymmetric Functional ROM: Decreased ROM Stability: No instability detected Muscle Tone/Strength: Functionally intact. No obvious neuro-muscular anomalies detected. Sensory (Neurological): Articular pain pattern Muscle strength & Tone: Complains of area being tender to  palpation  Lumbar Spine Area Exam  Skin & Axial Inspection: Thoraco-lumbar Scoliosis Alignment: Asymmetric Functional ROM: Decreased ROM, bilaterally Stability: No instability detected Muscle Tone/Strength: Functionally intact. No obvious neuro-muscular anomalies detected. Sensory (Neurological): Musculoskeletal pain pattern Palpation: Complains of area being tender to palpation       Provocative Tests: Lumbar Hyperextension and rotation test: evaluation deferred today       Lumbar Lateral bending test: evaluation deferred today       Patrick's Maneuver: evaluation deferred today                    Gait & Posture Assessment  Ambulation: Patient came in today in a wheel chair Gait: Very limited, using assistive device to ambulate Posture: Difficulty standing up straight, due to pain   Lower Extremity Exam    Side: Right lower extremity  Side: Left lower extremity  Skin & Extremity Inspection: Skin color, temperature, and hair growth are WNL. No peripheral edema or cyanosis. No masses, redness, swelling, asymmetry, or associated skin lesions. No contractures.  Skin & Extremity Inspection: Skin color, temperature, and hair growth are WNL. No peripheral edema or cyanosis. No masses, redness, swelling, asymmetry, or associated skin lesions. No contractures.  Functional ROM: Unrestricted ROM          Functional ROM: Unrestricted ROM          Muscle Tone/Strength: Functionally intact. No obvious neuro-muscular anomalies detected.  Muscle Tone/Strength: Functionally intact. No obvious neuro-muscular anomalies detected.  Sensory (Neurological): Unimpaired  Sensory (Neurological): Unimpaired  Palpation: No palpable anomalies  Palpation: No palpable anomalies   Assessment  Primary Diagnosis & Pertinent Problem List: The primary encounter diagnosis was Chronic pain syndrome. Diagnoses of Cervical spondylosis without myelopathy, Disability of walking, DDD (degenerative disc disease), cervical,  Cervicalgia, Chronic bilateral low back pain with bilateral sciatica, and Lumbar degenerative disc disease were also pertinent to this visit.  Status Diagnosis  Persistent Persistent Persistent 1. Chronic pain syndrome   2. Cervical spondylosis without myelopathy   3. Disability of walking   4. DDD (degenerative disc disease), cervical   5. Cervicalgia   6. Chronic bilateral low back pain with bilateral sciatica   7. Lumbar degenerative disc disease     General Recommendations: The pain condition that the patient suffers from is best treated with a multidisciplinary approach that involves an increase in physical activity to prevent de-conditioning and worsening of the pain cycle, as well as psychological counseling (formal and/or informal) to address the co-morbid psychological affects of pain. Treatment will often  involve judicious use of pain medications and interventional procedures to decrease the pain, allowing the patient to participate in the physical activity that will ultimately produce long-lasting pain reductions. The goal of the multidisciplinary approach is to return the patient to a higher level of overall function and to restore their ability to perform activities of daily living.  84 year old female with a history of chronic pain secondary to cervical spondylosis, cervical degenerative disc disease, lumbar degenerative disc disease, lumbar spondylosis who follows up status post left C4, 5, 6, 7 cervical facet medial branch nerve blocks which were not very effective as detailed above.  Patient states that she is continue to have persistent left neck pain that radiates into her posterior scalp region and is dealing with more frequent headaches.  Patient states that she has weaned herself off of her benzodiazepine and has found BuSpar as an alternative with her primary care, Cassell Smiles.  I commended the patient on this.  I will have the patient complete a urine drug screen today which  should be negative for benzodiazepines.  I will have her sign an opioid contract and we will start with hydrocodone 5 mg twice daily as needed, quantity 43-month  Plan: -UDS today.  Should be negative for benzodiazepines -Sign opioid contract -Hydrocodone 5 mg twice daily as needed, quantity 640-monthContinue gabapentin 600 mg 3 times daily -Continue BuSpar 7.5 mg twice daily -Continue Zoloft and Seroquel as prescribed.  Future considerations: Lumbar facet medial branch nerve block  Plan of Care  Pharmacotherapy (Medications Ordered): Meds ordered this encounter  Medications  . HYDROcodone-acetaminophen (NORCO/VICODIN) 5-325 MG tablet    Sig: Take 1 tablet by mouth 2 (two) times daily as needed for moderate pain.    Dispense:  60 tablet    Refill:  0    Do not place this medication, or any other prescription from our practice, on "Automatic Refill". Patient may have prescription filled one day early if pharmacy is closed on scheduled refill date. Do not fill until:  To last until:   Lab-work, procedure(s), and/or referral(s): Orders Placed This Encounter  Procedures  . Compliance Drug Analysis, Ur    Provider-requested follow-up: No follow-ups on file. Time Note: Greater than 50% of the 25 minute(s) of face-to-face time spent with Ms. Menter, was spent in counseling/coordination of care regarding: Ms. Rho's primary cause of pain, the treatment plan, treatment alternatives, medication side effects, the opioid analgesic risks and possible complications, the results, interpretation and significance of  her recent diagnostic interventional treatment(s), the appropriate use of her medications, realistic expectations, the goals of pain management (increased in functionality), the medication agreement and the patient's responsibilities when it comes to controlled substances. Future Appointments  Date Time Provider [place]5/03/2017  1:45 PM LaGillis SantaMD [facility]  None  08/13/2017  9:20 AM [facility]Mikey CollegeNP [facility]    Primary Care Physician: KeMikey CollegeNP [facility] Note by: BiGillis SantaM.D Date: 05/14/2017; Time: 12:48 PM  Patient Instructions  1.  Sign opioid contract today 2.  Repeat urine drug screen today. 3.  Prescription for hydrocodone 5 mg up to twice daily as needed for moderate to severe pain. 4.  Follow-up in 1 month.

## 2017-05-14 NOTE — Patient Instructions (Signed)
1.  Sign opioid contract today 2.  Repeat urine drug screen today. 3.  Prescription for hydrocodone 5 mg up to twice daily as needed for moderate to severe pain. 4.  Follow-up in 1 month.

## 2017-05-14 NOTE — Progress Notes (Signed)
Subjective:    Patient ID: Sandy Daniel, female    DOB: [birth year], 84 y.o.   MRN: [id number]  Sandy Daniel is a 84 y.o. female presenting on 05/14/2017 for Hypertension   HPI  Anxiety and Depression Pt reports she has stopped ativan per pain management request for concomitant treatment of pain with opioids. No additional medications have been started for anxiety.  Pt and daughter-in-law report anxiety is much higher with significantly increased irritability and requests to be treated today.  Last appointment was missed, so this is first opportunity to prescribe medications for anxiety.  Hypertension - She is not checking BP at home or outside of clinic.    - Current medications: Carvedilol 3.125 mg daily, tolerating well without side effects - She is not currently symptomatic.  She does report dizziness first in the morning when waking up moving from lying to standing.  She sits on the side of the bed for a while before standing and has no problems moving from sitting to standing. - Pt denies headache, changes in vision, chest tightness/pressure, palpitations, leg swelling, sudden loss of speech or loss of consciousness. - She  reports no regular exercise routine, but is active at home to do chores and housework.  Pt reports increasing strength and moods with increased activity.  Goes outside to walk in the yard some as well. - Her diet is moderate in salt, moderate in fat, and moderate in carbohydrates.   Assisted Living Paperwork request between visits occurred for requesting placement in skilled nursing facility. Pt does not qualify for that level of care.  Pt and daughter-in-law verbalize understanding and state they are working on living arrangements for senior housing (HUD) in [place].  Pt states she "will not" live in a SNF.  Could consider [facility]CS for assistance during day if needed for future.  Social History   Tobacco Use  . Smoking status: Current Every Day Smoker   Packs/day: 0.25    Years: 50.00    Pack years: 12.50    Types: Cigarettes  . Smokeless tobacco: Never Used  . Tobacco comment: 10 a day   Substance Use Topics  . Alcohol use: No  . Drug use: No    Review of Systems Per HPI unless specifically indicated above     Objective:    BP 137/64 (BP Location: Right Arm, Patient Position: Sitting, Cuff Size: Normal)   Pulse 65   Temp (!) 97.5 F (36.4 C) (Oral)   Ht 5' (1.524 m)   Wt 149 lb 3.2 oz (67.7 kg)   SpO2 (!) 65%   BMI 29.14 kg/m   Wt Readings from Last 3 Encounters:  05/14/17 149 lb (67.6 kg)  05/14/17 149 lb 3.2 oz (67.7 kg)  04/29/17 160 lb (72.6 kg)    Physical Exam  Constitutional: She is oriented to person, place, and time. She appears well-developed and well-nourished. No distress.  HENT:  Head: Normocephalic and atraumatic.  Neck: Normal range of motion. Neck supple. Carotid bruit is not present.  Cardiovascular: Normal rate, regular rhythm, S1 normal, S2 normal, normal heart sounds and intact distal pulses.  Pulmonary/Chest: Effort normal and breath sounds normal. No respiratory distress.  Musculoskeletal: She exhibits no edema (pedal).  Neurological: She is alert and oriented to person, place, and time.  Skin: Skin is warm and dry.  Psychiatric: She has a normal mood and affect. Her speech is normal and behavior is normal.  Pt is in better overall mood with less anger than  in past.  Vitals reviewed.   Results for orders placed or performed during the hospital encounter of 07/11/16  Blood culture (routine x 2)  Result Value Ref Range   Specimen Description BLOOD   RIGHT AC    Special Requests BOTTLES DRAWN AEROBIC AND ANAEROBIC  BCAV    Culture NO GROWTH 5 DAYS    Report Status 07/16/2016 FINAL   Blood culture (routine x 2)  Result Value Ref Range   Specimen Description BLOOD  RIGHT AC    Special Requests BOTTLES DRAWN AEROBIC AND ANAEROBIC  BCAV    Culture NO GROWTH 5 DAYS    Report Status 07/16/2016  FINAL   CBC with Differential  Result Value Ref Range   WBC 5.6 3.6 - 11.0 K/uL   RBC 4.26 3.80 - 5.20 MIL/uL   Hemoglobin 12.7 12.0 - 16.0 g/dL   HCT 10.2 72.5 - 36.6 %   MCV 89.4 80.0 - 100.0 fL   MCH 29.7 26.0 - 34.0 pg   MCHC 33.2 32.0 - 36.0 g/dL   RDW 44.0 (H) 34.7 - 42.5 %   Platelets 270 150 - 440 K/uL   Neutrophils Relative % 63 %   Neutro Abs 3.6 1.4 - 6.5 K/uL   Lymphocytes Relative 24 %   Lymphs Abs 1.4 1.0 - 3.6 K/uL   Monocytes Relative 11 %   Monocytes Absolute 0.6 0.2 - 0.9 K/uL   Eosinophils Relative 1 %   Eosinophils Absolute 0.1 0 - 0.7 K/uL   Basophils Relative 1 %   Basophils Absolute 0.0 0 - 0.1 K/uL  Comprehensive metabolic panel  Result Value Ref Range   Sodium 141 135 - 145 mmol/L   Potassium 4.2 3.5 - 5.1 mmol/L   Chloride 107 101 - 111 mmol/L   CO2 26 22 - 32 mmol/L   Glucose, Bld 98 65 - 99 mg/dL   BUN 24 (H) 6 - 20 mg/dL   Creatinine, Ser 9.56 0.44 - 1.00 mg/dL   Calcium 9.2 8.9 - 38.7 mg/dL   Total Protein 7.8 6.5 - 8.1 g/dL   Albumin 3.7 3.5 - 5.0 g/dL   AST 24 15 - 41 U/L   ALT 18 14 - 54 U/L   Alkaline Phosphatase 175 (H) 38 - 126 U/L   Total Bilirubin 0.6 0.3 - 1.2 mg/dL   GFR calc non Af Amer >60 >60 mL/min   GFR calc Af Amer >60 >60 mL/min   Anion gap 8 5 - 15  Troponin I  Result Value Ref Range   Troponin I <0.03 <0.03 ng/mL  Brain natriuretic peptide  Result Value Ref Range   B Natriuretic Peptide 105.0 (H) 0.0 - 100.0 pg/mL  Magnesium  Result Value Ref Range   Magnesium 2.0 1.7 - 2.4 mg/dL  Fibrin derivatives D-Dimer ([facility] only)  Result Value Ref Range   Fibrin derivatives D-dimer ([facility]) 2,359.92 (H) 0.00 - 499.00  Basic metabolic panel  Result Value Ref Range   Sodium 141 135 - 145 mmol/L   Potassium 3.7 3.5 - 5.1 mmol/L   Chloride 107 101 - 111 mmol/L   CO2 28 22 - 32 mmol/L   Glucose, Bld 76 65 - 99 mg/dL   BUN 22 (H) 6 - 20 mg/dL   Creatinine, Ser 5.64 0.44 - 1.00 mg/dL   Calcium 8.6 (L) 8.9 - 10.3 mg/dL   GFR  calc non Af Amer >60 >60 mL/min   GFR calc Af Amer >60 >60 mL/min   Anion  gap 6 5 - 15  CBC  Result Value Ref Range   WBC 5.8 3.6 - 11.0 K/uL   RBC 3.73 (L) 3.80 - 5.20 MIL/uL   Hemoglobin 11.5 (L) 12.0 - 16.0 g/dL   HCT 16.1 (L) 09.6 - 04.5 %   MCV 90.3 80.0 - 100.0 fL   MCH 30.9 26.0 - 34.0 pg   MCHC 34.2 32.0 - 36.0 g/dL   RDW 40.9 (H) 81.1 - 91.4 %   Platelets 226 150 - 440 K/uL  ECHOCARDIOGRAM COMPLETE  Result Value Ref Range   Weight 2,560 oz   Height 59 in   BP 153/69 mmHg      Assessment & Plan:   Problem List Items Addressed This Visit      Cardiovascular and Mediastinum   HTN (hypertension)    Slightly elevated in clinic today.  Pt BP goal < 130/80.  Taking carvedilol only once daily.  Pt w/ resolved lower leg swelling.  Plan: 1. CHANGE carvedilol 3.125 mg from once daily to bid. 2. Reinforced DASH diet and physical activity. 3. Follow up 3 months.        Other   Anxiety - Primary    Currently uncontrolled.  Worsening off lorazepam, but is off for compliance to continue opioid therapy at pain management.   However, clinical presentation shows improved mood overall with much less agitation.    Plan: 1. STOP lorazepam 0.5 mg bid prn. 2. Continue sertraline 200 mg once daily 3. Continue seroquel hs. 4. START buspirone 7.5 mg bid.  Take once daily for 7 days prior to taking twice daily and continuing. 5. Followup 3 months or sooner if needed.      Relevant Medications   busPIRone (BUSPAR) 7.5 MG tablet   Major depressive disorder with single episode, in partial remission (HCC)    Overall improved mood today with increase in physical activity.  Continue activity and management of depression with medications as above for anxiety.      Relevant Medications   busPIRone (BUSPAR) 7.5 MG tablet      Meds ordered this encounter  Medications  . busPIRone (BUSPAR) 7.5 MG tablet    Sig: Take 1 tablet (7.5 mg total) by mouth 2 (two) times daily.    Dispense:   60 tablet    Refill:  2    Order Specific Question:   Supervising Provider    Answer:   Smitty Cords [[id number]]    Follow up plan: Return in about 3 months (around 08/13/2017) for hypertension and anxiety.  Return sooner if buspirone is not helping.[name], DNP, AGPCNP-BC Adult Gerontology Primary Care Nurse Practitioner [facility] 05/15/2017, 1:01 PM

## 2017-05-14 NOTE — Progress Notes (Signed)
Safety precautions to be maintained throughout the outpatient stay will include: orient to surroundings, keep bed in low position, maintain call bell within reach at all times, provide assistance with transfer out of bed and ambulation.  

## 2017-05-14 NOTE — Patient Instructions (Addendum)
Wende Bushy,   Thank you for coming in to clinic today.  1. Stay active and keep yourself strong.  2. Continue pain management with Dr. Cherylann Ratel.  3. START buspirone 7.5 mg twice daily.  START buspar 7.5 mg once daily for 1 week.  Increase to twice daily for 1 week and continue.  Please schedule a follow-up appointment with [name], AGNP. Return in about 3 months (around 08/13/2017) for hypertension and anxiety.  Return sooner if buspirone is not helping..  If you have any other questions or concerns, please feel free to call the clinic or send a message through MyChart. You may also schedule an earlier appointment if necessary.  You will receive a survey after today's visit either digitally by e-mail or paper by [employer]. Your experiences and feedback matter to [place].  Please respond so we know how we are doing as we provide care for you.   [name], DNP, AGNP-BC  Adult Gerontology Nurse Practitioner [facility]

## 2017-05-15 ENCOUNTER — Other Ambulatory Visit: Payer: Self-pay | Admitting: Nurse Practitioner

## 2017-05-15 ENCOUNTER — Encounter: Payer: Self-pay | Admitting: Nurse Practitioner

## 2017-05-15 DIAGNOSIS — E785 Hyperlipidemia, unspecified: Secondary | ICD-10-CM

## 2017-05-15 DIAGNOSIS — J449 Chronic obstructive pulmonary disease, unspecified: Secondary | ICD-10-CM

## 2017-05-15 DIAGNOSIS — F324 Major depressive disorder, single episode, in partial remission: Secondary | ICD-10-CM

## 2017-05-15 DIAGNOSIS — I10 Essential (primary) hypertension: Secondary | ICD-10-CM

## 2017-05-15 DIAGNOSIS — K2100 Gastro-esophageal reflux disease with esophagitis, without bleeding: Secondary | ICD-10-CM

## 2017-05-15 DIAGNOSIS — I2510 Atherosclerotic heart disease of native coronary artery without angina pectoris: Secondary | ICD-10-CM

## 2017-05-15 DIAGNOSIS — G40909 Epilepsy, unspecified, not intractable, without status epilepticus: Secondary | ICD-10-CM

## 2017-05-15 DIAGNOSIS — J431 Panlobular emphysema: Secondary | ICD-10-CM

## 2017-05-15 DIAGNOSIS — F331 Major depressive disorder, recurrent, moderate: Secondary | ICD-10-CM

## 2017-05-15 DIAGNOSIS — I5032 Chronic diastolic (congestive) heart failure: Secondary | ICD-10-CM

## 2017-05-15 MED ORDER — PANTOPRAZOLE SODIUM 40 MG PO TBEC: 40.0000 mg | DELAYED_RELEASE_TABLET | Freq: Every day | ORAL | 1 refills | Status: DC

## 2017-05-15 MED ORDER — CARVEDILOL 3.125 MG PO TABS: 3.1250 mg | ORAL_TABLET | Freq: Two times a day (BID) | ORAL | 1 refills | Status: DC

## 2017-05-15 MED ORDER — FLUTICASONE-SALMETEROL 100-50 MCG/DOSE IN AEPB: 1.0000 | INHALATION_SPRAY | Freq: Two times a day (BID) | RESPIRATORY_TRACT | 3 refills | Status: DC

## 2017-05-15 MED ORDER — QUETIAPINE FUMARATE 50 MG PO TABS: 50.0000 mg | ORAL_TABLET | Freq: Every day | ORAL | 1 refills | Status: DC

## 2017-05-15 MED ORDER — ALBUTEROL SULFATE HFA 108 (90 BASE) MCG/ACT IN AERS: 2.0000 | INHALATION_SPRAY | Freq: Four times a day (QID) | RESPIRATORY_TRACT | 5 refills | Status: DC | PRN

## 2017-05-15 MED ORDER — ATORVASTATIN CALCIUM 10 MG PO TABS: 10.0000 mg | ORAL_TABLET | Freq: Every day | ORAL | 1 refills | Status: DC

## 2017-05-15 MED ORDER — ALBUTEROL SULFATE (2.5 MG/3ML) 0.083% IN NEBU: INHALATION_SOLUTION | RESPIRATORY_TRACT | 3 refills | Status: DC

## 2017-05-15 MED ORDER — SERTRALINE HCL 100 MG PO TABS: 100.0000 mg | ORAL_TABLET | Freq: Every day | ORAL | 1 refills | Status: DC

## 2017-05-15 MED ORDER — NITROGLYCERIN 0.4 MG SL SUBL: 0.4000 mg | SUBLINGUAL_TABLET | SUBLINGUAL | 5 refills | Status: DC | PRN

## 2017-05-15 MED ORDER — PHENYTOIN SODIUM EXTENDED 100 MG PO CAPS: 200.0000 mg | ORAL_CAPSULE | Freq: Every day | ORAL | 1 refills | Status: DC

## 2017-05-15 NOTE — Assessment & Plan Note (Signed)
Slightly elevated in clinic today.  Pt BP goal < 130/80.  Taking carvedilol only once daily.  Pt w/ resolved lower leg swelling.  Plan: 1. CHANGE carvedilol 3.125 mg from once daily to bid. 2. Reinforced DASH diet and physical activity. 3. Follow up 3 months.

## 2017-05-15 NOTE — Assessment & Plan Note (Signed)
Overall improved mood today with increase in physical activity.  Continue activity and management of depression with medications as above for anxiety.

## 2017-05-15 NOTE — Assessment & Plan Note (Signed)
Currently uncontrolled.  Worsening off lorazepam, but is off for compliance to continue opioid therapy at pain management.   However, clinical presentation shows improved mood overall with much less agitation.    Plan: 1. STOP lorazepam 0.5 mg bid prn. 2. Continue sertraline 200 mg once daily 3. Continue seroquel hs. 4. START buspirone 7.5 mg bid.  Take once daily for 7 days prior to taking twice daily and continuing. 5. Followup 3 months or sooner if needed.

## 2017-05-18 LAB — COMPLIANCE DRUG ANALYSIS, UR

## 2017-05-21 ENCOUNTER — Telehealth: Payer: Self-pay | Admitting: Nurse Practitioner

## 2017-05-21 DIAGNOSIS — Z742 Need for assistance at home and no other household member able to render care: Secondary | ICD-10-CM

## 2017-05-21 NOTE — Telephone Encounter (Signed)
Pt's son Jillyn Hidden dropped off paper to have mother put in [place].  He said she was not able to stay by herself and he is leaving Thursday to go to [place].  He said his only other option is to drop her off at hospital.  His call back number is [phone].

## 2017-05-21 NOTE — Telephone Encounter (Signed)
Have called Sandy Daniel [phone] with information about Sandy Daniel.  She makes too much money per month to qualify for significant medicare/medicaid special assistance for paying for assisted living.  She would get enough special assistance to qualify for skilled nursing facility.  Call placed to Sandy Daniel, pt's son: [phone]  Pt lives with her son Sandy Daniel and his wife Sandy Daniel.  Sandy Daniel has been primary caregiver and is now currently sick and hospitalized or was recently hospitalized and can no longer continue care for [name] independently.  Sandy Daniel works out of town for prolonged periods of time.  He is working to find placement for his mother so she is not left home alone.  He has expressed significant frustration with lack of coverage for this type of care and feels his only option is to "turn her over to the state or drop her off at the hospital" when he leaves to go back to work on Thursday to [place].    I have placed C3 referral and discussed case with Sandy Daniel.  Sandy knows of possible option that pt may qualify for higher level of care based on financial need given current circumstances. Await additional information.

## 2017-05-22 NOTE — Telephone Encounter (Signed)
FL2 has been completed for short term rehabilitation and will be faxed to [facility].     - Primary diagnosis: gait instability

## 2017-05-30 ENCOUNTER — Telehealth: Payer: Self-pay | Admitting: Nurse Practitioner

## 2017-05-30 NOTE — Telephone Encounter (Signed)
Appreciate the updates.  Will be willing to assist with DSS referral if needed.  ----- Also from Thomasene Ripple sent at 05/29/2017 10:47 AM EDT ----- DSS referral made and since daughter-in-law is sick and unable to care for patient, they think they will be able to intervene and assist but they will let us know for sure later. Will let you know if I hear anything!
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
## 2017-06-03 ENCOUNTER — Telehealth: Payer: Self-pay | Admitting: Nurse Practitioner

## 2017-06-03 ENCOUNTER — Emergency Department: Payer: Medicare HMO

## 2017-06-03 ENCOUNTER — Other Ambulatory Visit: Payer: Self-pay

## 2017-06-03 ENCOUNTER — Encounter: Payer: Self-pay | Admitting: Emergency Medicine

## 2017-06-03 ENCOUNTER — Emergency Department
Admission: EM | Admit: 2017-06-03 | Discharge: 2017-06-07 | Disposition: A | Discharge status: Home / Self Care | Payer: Medicare HMO | Attending: Emergency Medicine | Admitting: Emergency Medicine

## 2017-06-03 DIAGNOSIS — F17210 Nicotine dependence, cigarettes, uncomplicated: Secondary | ICD-10-CM | POA: Insufficient documentation

## 2017-06-03 DIAGNOSIS — I5032 Chronic diastolic (congestive) heart failure: Secondary | ICD-10-CM

## 2017-06-03 DIAGNOSIS — G40909 Epilepsy, unspecified, not intractable, without status epilepticus: Secondary | ICD-10-CM

## 2017-06-03 DIAGNOSIS — R4589 Other symptoms and signs involving emotional state: Secondary | ICD-10-CM | POA: Diagnosis not present

## 2017-06-03 DIAGNOSIS — F068 Other specified mental disorders due to known physiological condition: Secondary | ICD-10-CM | POA: Diagnosis not present

## 2017-06-03 DIAGNOSIS — Z79899 Other long term (current) drug therapy: Secondary | ICD-10-CM | POA: Diagnosis not present

## 2017-06-03 DIAGNOSIS — F329 Major depressive disorder, single episode, unspecified: Secondary | ICD-10-CM | POA: Insufficient documentation

## 2017-06-03 DIAGNOSIS — Z7982 Long term (current) use of aspirin: Secondary | ICD-10-CM | POA: Insufficient documentation

## 2017-06-03 DIAGNOSIS — I10 Essential (primary) hypertension: Secondary | ICD-10-CM | POA: Insufficient documentation

## 2017-06-03 DIAGNOSIS — K2100 Gastro-esophageal reflux disease with esophagitis, without bleeding: Secondary | ICD-10-CM

## 2017-06-03 DIAGNOSIS — J449 Chronic obstructive pulmonary disease, unspecified: Secondary | ICD-10-CM | POA: Diagnosis not present

## 2017-06-03 DIAGNOSIS — I259 Chronic ischemic heart disease, unspecified: Secondary | ICD-10-CM | POA: Diagnosis not present

## 2017-06-03 DIAGNOSIS — J431 Panlobular emphysema: Secondary | ICD-10-CM

## 2017-06-03 DIAGNOSIS — R4689 Other symptoms and signs involving appearance and behavior: Secondary | ICD-10-CM

## 2017-06-03 DIAGNOSIS — M542 Cervicalgia: Secondary | ICD-10-CM

## 2017-06-03 DIAGNOSIS — E785 Hyperlipidemia, unspecified: Secondary | ICD-10-CM

## 2017-06-03 DIAGNOSIS — F419 Anxiety disorder, unspecified: Secondary | ICD-10-CM

## 2017-06-03 DIAGNOSIS — R413 Other amnesia: Secondary | ICD-10-CM

## 2017-06-03 DIAGNOSIS — F324 Major depressive disorder, single episode, in partial remission: Secondary | ICD-10-CM

## 2017-06-03 DIAGNOSIS — Z046 Encounter for general psychiatric examination, requested by authority: Secondary | ICD-10-CM | POA: Diagnosis present

## 2017-06-03 DIAGNOSIS — I2510 Atherosclerotic heart disease of native coronary artery without angina pectoris: Secondary | ICD-10-CM

## 2017-06-03 LAB — CBC WITH DIFFERENTIAL/PLATELET
BASOS PCT: 1 %
Basophils Absolute: 0.1 10*3/uL (ref 0–0.1)
EOS ABS: 0.1 10*3/uL (ref 0–0.7)
EOS PCT: 1 %
HEMATOCRIT: 40 % (ref 35.0–47.0)
HEMOGLOBIN: 13.4 g/dL (ref 12.0–16.0)
LYMPHS PCT: 32 %
Lymphs Abs: 1.7 10*3/uL (ref 1.0–3.6)
MCH: 30.2 pg (ref 26.0–34.0)
MCHC: 33.4 g/dL (ref 32.0–36.0)
MCV: 90.4 fL (ref 80.0–100.0)
MONOS PCT: 14 %
Monocytes Absolute: 0.7 10*3/uL (ref 0.2–0.9)
NEUTROS PCT: 52 %
Neutro Abs: 2.7 10*3/uL (ref 1.4–6.5)
Platelets: 264 10*3/uL (ref 150–440)
RBC: 4.42 MIL/uL (ref 3.80–5.20)
RDW: 14.4 % (ref 11.5–14.5)
WBC: 5.3 10*3/uL (ref 3.6–11.0)

## 2017-06-03 LAB — URINALYSIS, COMPLETE (UACMP) WITH MICROSCOPIC
BILIRUBIN URINE: NEGATIVE
GLUCOSE, UA: NEGATIVE mg/dL
Hgb urine dipstick: NEGATIVE
KETONES UR: NEGATIVE mg/dL
Nitrite: NEGATIVE
PH: 6 (ref 5.0–8.0)
PROTEIN: NEGATIVE mg/dL
Specific Gravity, Urine: 1.014 (ref 1.005–1.030)

## 2017-06-03 LAB — COMPREHENSIVE METABOLIC PANEL
ALK PHOS: 141 U/L — AB (ref 38–126)
ALT: 13 U/L — AB (ref 14–54)
AST: 20 U/L (ref 15–41)
Albumin: 3.7 g/dL (ref 3.5–5.0)
Anion gap: 7 (ref 5–15)
BUN: 23 mg/dL — ABNORMAL HIGH (ref 6–20)
CALCIUM: 8.8 mg/dL — AB (ref 8.9–10.3)
CHLORIDE: 103 mmol/L (ref 101–111)
CO2: 28 mmol/L (ref 22–32)
CREATININE: 0.83 mg/dL (ref 0.44–1.00)
GFR calc Af Amer: >60 mL/min (ref >60)
GFR calc non Af Amer: >60 mL/min (ref >60)
Glucose, Bld: 91 mg/dL (ref 65–99)
Potassium: 4.3 mmol/L (ref 3.5–5.1)
SODIUM: 138 mmol/L (ref 135–145)
Total Bilirubin: 0.5 mg/dL (ref 0.3–1.2)
Total Protein: 7.2 g/dL (ref 6.5–8.1)

## 2017-06-03 LAB — URINE DRUG SCREEN, QUALITATIVE (ARMC ONLY)
Amphetamines, Ur Screen: NOT DETECTED
BARBITURATES, UR SCREEN: NOT DETECTED
Benzodiazepine, Ur Scrn: NOT DETECTED
CANNABINOID 50 NG, UR ~~LOC~~: NOT DETECTED
COCAINE METABOLITE, UR ~~LOC~~: NOT DETECTED
MDMA (ECSTASY) UR SCREEN: NOT DETECTED
Methadone Scn, Ur: NOT DETECTED
OPIATE, UR SCREEN: NOT DETECTED
PHENCYCLIDINE (PCP) UR S: NOT DETECTED
Tricyclic, Ur Screen: NOT DETECTED

## 2017-06-03 LAB — ACETAMINOPHEN LEVEL: Acetaminophen (Tylenol), Serum: <10 ug/mL — ABNORMAL LOW (ref 10–30)

## 2017-06-03 LAB — SEDIMENTATION RATE: Sed Rate: 35 mm/hr — ABNORMAL HIGH (ref 0–30)

## 2017-06-03 LAB — ETHANOL: Alcohol, Ethyl (B): <10 mg/dL (ref <10)

## 2017-06-03 LAB — SALICYLATE LEVEL: Salicylate Lvl: <7 mg/dL (ref 2.8–30.0)

## 2017-06-03 MED ORDER — PANTOPRAZOLE SODIUM 40 MG PO TBEC
40.0000 mg | DELAYED_RELEASE_TABLET | Freq: Every day | ORAL | Status: DC
  Administered 2017-06-04 – 2017-06-07 (×4): 40 mg via ORAL
  Filled 2017-06-03 (×4): qty 1

## 2017-06-03 MED ORDER — SERTRALINE HCL 50 MG PO TABS
100.0000 mg | ORAL_TABLET | Freq: Every day | ORAL | Status: DC
  Administered 2017-06-04 – 2017-06-07 (×4): 100 mg via ORAL
  Filled 2017-06-03 (×4): qty 2

## 2017-06-03 MED ORDER — PHENYTOIN SODIUM EXTENDED 100 MG PO CAPS
200.0000 mg | ORAL_CAPSULE | Freq: Every day | ORAL | Status: DC
  Administered 2017-06-03 – 2017-06-06 (×4): 200 mg via ORAL
  Filled 2017-06-03 (×4): qty 2

## 2017-06-03 MED ORDER — BUSPIRONE HCL 5 MG PO TABS
7.5000 mg | ORAL_TABLET | Freq: Two times a day (BID) | ORAL | Status: DC
  Administered 2017-06-03 – 2017-06-07 (×8): 7.5 mg via ORAL
  Filled 2017-06-03 (×8): qty 2

## 2017-06-03 MED ORDER — CARVEDILOL 6.25 MG PO TABS
3.1250 mg | ORAL_TABLET | Freq: Two times a day (BID) | ORAL | Status: DC
  Administered 2017-06-04 – 2017-06-07 (×6): 3.125 mg via ORAL
  Filled 2017-06-03 (×7): qty 1

## 2017-06-03 MED ORDER — ASPIRIN EC 81 MG PO TBEC
81.0000 mg | DELAYED_RELEASE_TABLET | Freq: Every day | ORAL | Status: DC
  Administered 2017-06-04 – 2017-06-07 (×4): 81 mg via ORAL
  Filled 2017-06-03 (×4): qty 1

## 2017-06-03 MED ORDER — FUROSEMIDE 40 MG PO TABS
20.0000 mg | ORAL_TABLET | Freq: Every day | ORAL | Status: DC
  Administered 2017-06-04 – 2017-06-07 (×4): 20 mg via ORAL
  Filled 2017-06-03 (×4): qty 1

## 2017-06-03 MED ORDER — ALBUTEROL SULFATE (2.5 MG/3ML) 0.083% IN NEBU: 2.5000 mg | INHALATION_SOLUTION | Freq: Four times a day (QID) | RESPIRATORY_TRACT | Status: DC | PRN

## 2017-06-03 MED ORDER — ATROPINE SULFATE 1 MG/10ML IJ SOSY
PREFILLED_SYRINGE | INTRAMUSCULAR | Status: AC
  Filled 2017-06-03: qty 10

## 2017-06-03 MED ORDER — ATORVASTATIN CALCIUM 20 MG PO TABS
10.0000 mg | ORAL_TABLET | Freq: Every day | ORAL | Status: DC
  Administered 2017-06-04 – 2017-06-07 (×4): 10 mg via ORAL
  Filled 2017-06-03 (×4): qty 1

## 2017-06-03 MED ORDER — ALBUTEROL SULFATE HFA 108 (90 BASE) MCG/ACT IN AERS
2.0000 | INHALATION_SPRAY | Freq: Four times a day (QID) | RESPIRATORY_TRACT | Status: DC | PRN
  Administered 2017-06-03: 2 via RESPIRATORY_TRACT
  Filled 2017-06-03: qty 6.7

## 2017-06-03 MED ORDER — GABAPENTIN 600 MG PO TABS
600.0000 mg | ORAL_TABLET | Freq: Three times a day (TID) | ORAL | Status: DC | PRN
  Administered 2017-06-04 – 2017-06-07 (×4): 600 mg via ORAL
  Filled 2017-06-03 (×4): qty 1

## 2017-06-03 MED ORDER — ACETAMINOPHEN 325 MG PO TABS
ORAL_TABLET | ORAL | Status: AC
  Administered 2017-06-03: 650 mg via ORAL
  Filled 2017-06-03: qty 2

## 2017-06-03 MED ORDER — ACETAMINOPHEN 325 MG PO TABS
650.0000 mg | ORAL_TABLET | Freq: Once | ORAL | Status: AC
  Administered 2017-06-03: 650 mg via ORAL

## 2017-06-03 MED ORDER — QUETIAPINE FUMARATE 25 MG PO TABS
50.0000 mg | ORAL_TABLET | Freq: Every day | ORAL | Status: DC
  Administered 2017-06-03 – 2017-06-06 (×4): 50 mg via ORAL
  Filled 2017-06-03 (×4): qty 2

## 2017-06-03 MED ORDER — HYDROCODONE-ACETAMINOPHEN 5-325 MG PO TABS
1.0000 | ORAL_TABLET | Freq: Two times a day (BID) | ORAL | Status: DC | PRN
  Administered 2017-06-03 – 2017-06-07 (×6): 1 via ORAL
  Filled 2017-06-03 (×6): qty 1

## 2017-06-03 MED ORDER — MOMETASONE FURO-FORMOTEROL FUM 100-5 MCG/ACT IN AERO
2.0000 | INHALATION_SPRAY | Freq: Two times a day (BID) | RESPIRATORY_TRACT | Status: DC
  Administered 2017-06-03 – 2017-06-07 (×7): 2 via RESPIRATORY_TRACT
  Filled 2017-06-03 (×2): qty 8.8
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
## 2017-06-03 NOTE — ED Notes (Signed)
Pt provided meal tray by this tech, pt expresses no further needs at this time

## 2017-06-03 NOTE — ED Triage Notes (Signed)
Daughter in law dropped patient off with 2 pieces of paper with DSS numbers on them and left. Pt unsure why she's here, said "Amy told her she was here for a referral". Daughter in law Therapist, music) called and informed she needed to come back to ED.

## 2017-06-03 NOTE — ED Triage Notes (Signed)
Patient does not know why she is here.  Family Is on phone with dss?

## 2017-06-03 NOTE — ED Notes (Signed)
Spoke with pt's daughter in law, she was instructed that she needed to return to ED to give [place] information as to why the patient is here. She stated that she has a flat tire and will be here in .
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
## 2017-06-03 NOTE — ED Notes (Signed)
Patient transported to X-ray 

## 2017-06-03 NOTE — ED Triage Notes (Signed)
Ex daugter in law.  Patsy hutchins  [phone].   Pt has been staying with her.
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
## 2017-06-04 ENCOUNTER — Encounter: Payer: Self-pay | Admitting: Nurse Practitioner

## 2017-06-04 NOTE — ED Notes (Signed)
Pt. Alert and oriented, warm and dry, in no distress. Pt. Denies SI, HI, and AVH. Pt. Encouraged to let nursing staff know of any concerns or needs. 

## 2017-06-04 NOTE — [place] FL2 (Signed)
[place] MEDICAID FL2 LEVEL OF CARE SCREENING TOOL     IDENTIFICATION  Patient Name: Sandy Daniel Birthdate: 11/07/1928 Sex: female Admission Date (Current Location): 06/03/2017  [facility] and [place] Number:  Chiropodist and Address:  [facility], [address] [place], [place] [address]      Provider Number: [id number]  Attending Physician Name and Address:  No att. providers found  Relative Name and Phone Number:       Current Level of Care: Hospital Recommended Level of Care: Assisted Living Facility Prior Approval Number:    Date Approved/Denied:   PASRR Number:    Discharge Plan: Domiciliary (Rest home)    Current Diagnoses: Patient Active Problem List   Diagnosis Date Noted  . 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 Major depressive disorder with single episode, in partial remission (HCC) 05/15/2017  . Neck pain 01/31/2017  . Chronic right shoulder pain 01/31/2017  . Chronic bilateral thoracic back pain 12/04/2016  . Muscle spasm of back 10/22/2016  . (HFpEF) heart failure with preserved ejection fraction (HCC) 09/11/2016  . Anxiety 07/16/2016  . Chronic bilateral low back pain 07/16/2016  . Unstable gait 07/16/2016  . Controlled substance agreement signed 03/27/2016  . Dysphagia 03/26/2016  . Panic disorder 03/26/2016  . CAD (coronary artery disease)   . Seizure disorder (HCC)   . Macular degeneration of both eyes   . B12 deficiency   . COPD (chronic obstructive pulmonary disease) (HCC)   . HTN (hypertension)   . Hyperlipidemia   . Arthritis   . Depression   . GERD (gastroesophageal reflux disease)   . Physiological tremor 02/18/2014    Orientation RESPIRATION BLADDER Height & Weight     Self, Time, Situation, Place  Normal Continent Weight: 149 lb (67.6 kg) Height:  5' (152.4 cm)  BEHAVIORAL SYMPTOMS/MOOD NEUROLOGICAL BOWEL NUTRITION STATUS      Continent Diet(Normal Diet)   AMBULATORY STATUS COMMUNICATION OF NEEDS Skin   Supervision(Utilizes cane for ambulation) Verbally Normal                       Personal Care Assistance Level of Assistance  Bathing, Feeding, Dressing Bathing Assistance: Limited assistance Feeding assistance: Independent Dressing Assistance: Limited assistance     Functional Limitations Info  Sight, Hearing, Speech Sight Info: Impaired(Macular degeneration in both eyes) Hearing Info: Impaired Speech Info: Adequate    SPECIAL CARE FACTORS FREQUENCY                       Contractures Contractures Info: Not present    Additional Factors Info  Code Status, Allergies, Psychotropic Code Status Info: Full Allergies Info: Motrin Ibuprofen, Tolmetin, Alendronate, Other, Tramadol Hcl Psychotropic Info: See Medication List         Current Medications (06/04/2017):  This is the current hospital active medication list Current Facility-Administered Medications  Medication Dose Route Frequency Provider Last Rate Last Dose  . albuterol (PROVENTIL HFA;VENTOLIN HFA) 108 (90 Base) MCG/ACT inhaler 2 puff  2 puff Inhalation Q6H PRN Nita Sickle, MD   2 puff at 06/03/17 2359  . aspirin EC tablet 81 mg  81 mg Oral Daily Don Perking, [place], MD   81 mg at 06/04/17 1110  . atorvastatin (LIPITOR) tablet 10 mg  10 mg Oral Daily Don Perking, [place], MD   10 mg at 06/04/17 1109  . busPIRone (BUSPAR) tablet 7.5 mg  7.5 mg Oral BID Nita Sickle, MD  7.5 mg at 06/04/17 1110  . carvedilol (COREG) tablet 3.125 mg  3.125 mg Oral BID WC Don Perking, [place], MD   3.125 mg at 06/04/17 0749  . furosemide (LASIX) tablet 20 mg  20 mg Oral Daily Don Perking, [place], MD   20 mg at 06/04/17 1109  . gabapentin (NEURONTIN) tablet 600 mg  600 mg Oral TID PRN Nita Sickle, MD   600 mg at 06/04/17 0746  . HYDROcodone-acetaminophen (NORCO/VICODIN) 5-325 MG per tablet 1 tablet  1 tablet Oral BID PRN Nita Sickle, MD   1 tablet at 06/04/17  1110  . mometasone-formoterol (DULERA) 100-5 MCG/ACT inhaler 2 puff  2 puff Inhalation BID Nita Sickle, MD   2 puff at 06/04/17 0747  . pantoprazole (PROTONIX) EC tablet 40 mg  40 mg Oral Daily Don Perking, [place], MD   40 mg at 06/04/17 1109  . phenytoin (DILANTIN) ER capsule 200 mg  200 mg Oral QHS Don Perking, [place], MD   200 mg at 06/03/17 2234  . QUEtiapine (SEROQUEL) tablet 50 mg  50 mg Oral QHS Don Perking, [place], MD   50 mg at 06/03/17 2234  . sertraline (ZOLOFT) tablet 100 mg  100 mg Oral Daily Don Perking, [place], MD   100 mg at 06/04/17 1110   Current Outpatient Medications  Medication Sig Dispense Refill  . albuterol (PROVENTIL HFA;VENTOLIN HFA) 108 (90 Base) MCG/ACT inhaler Inhale 2 puffs into the lungs every 6 (six) hours as needed for wheezing or shortness of breath. 1 Inhaler 5  . albuterol (PROVENTIL) (2.5 MG/3ML) 0.083% nebulizer solution Take 3 mLs (2.5 mg total) by nebulization every 6 (six) hours as needed for Wheezing. 75 mL 3  . aspirin 81 MG tablet Take 1 tablet (81 mg total) by mouth daily. 30 tablet 11  . atorvastatin (LIPITOR) 10 MG tablet Take 1 tablet (10 mg total) by mouth daily. 90 tablet 1  . busPIRone (BUSPAR) 7.5 MG tablet Take 1 tablet (7.5 mg total) by mouth 2 (two) times daily. 60 tablet 2  . carvedilol (COREG) 3.125 MG tablet Take 1 tablet (3.125 mg total) by mouth 2 (two) times daily with a meal. 180 tablet 1  . Fluticasone-Salmeterol (ADVAIR) 100-50 MCG/DOSE AEPB Inhale 1 puff into the lungs 2 (two) times daily. 1 each 3  . furosemide (LASIX) 20 MG tablet Take 20 mg by mouth daily. 1/2 tab    . gabapentin (NEURONTIN) 300 MG capsule Take 2 capsules (600 mg total) by mouth 3 (three) times daily as needed (moderate pain). 270 capsule 1  . HYDROcodone-acetaminophen (NORCO/VICODIN) 5-325 MG tablet Take 1 tablet by mouth 2 (two) times daily as needed for moderate pain. 60 tablet 0  . nitroGLYCERIN (NITROSTAT) 0.4 MG SL tablet Place 1 tablet (0.4 mg total)  under the tongue every 5 (five) minutes as needed for chest pain. 20 tablet 5  . pantoprazole (PROTONIX) 40 MG tablet Take 1 tablet (40 mg total) by mouth daily. 90 tablet 1  . phenytoin (DILANTIN) 100 MG ER capsule Take 2 capsules (200 mg total) by mouth at bedtime. Take 2 by mouth at bedtime 180 capsule 1  . QUEtiapine (SEROQUEL) 50 MG tablet Take 1 tablet (50 mg total) by mouth at bedtime. 90 tablet 1  . sertraline (ZOLOFT) 100 MG tablet Take 1 tablet (100 mg total) by mouth daily. 90 tablet 1     Discharge Medications: Please see discharge summary for a list of discharge medications.  Relevant Imaging Results:  Relevant Lab Results:   Additional Information SSN: [id number]  Dominic Pea, LCSW

## 2017-06-04 NOTE — BH Assessment (Signed)
This Clinical research associate received a return call from patient son, Jillyn Hidden at [phone], and he stated he is in [place] working and repeatedly stated "I don't know what to tell ya, DSS is supposed to take care of it." Son couldn't provide a local family member for me to contact.    Writer followed up with ED nurse, Amy T.  And she stated patient is under SW now.

## 2017-06-04 NOTE — Clinical Social Work Note (Addendum)
CSW received a consult for "Will need placement, aggressive, can't be cared for at home." CSW reviewed patient's chart. Patient from home with daughter-in-law, Tessie Fass, who dropped patient off at ED for a "mental evaluation" and is now not responding to phone calls. Patient has been psychiatrically cleared and is ready for discharge. CSW left a voicemail for patient's Adult Scientist, forensic (APS) Social Worker Rory Percy [phone]). Awaiting a call back.  CSW met with patient at bedside, at length. Patient states she was living in [facility] by herself after her husband dies and realized she needed help paying her bills due to progressive macular degeneration in both eyes. Patient states she moved in with her other son in [place], [place] and his wife until son died. Patient states Daughter-in-law Shauna Hugh placed her in a [place] in [place] after other son dies, until son-Gary moved patient in with him and daughter-in-law Pasty. Per patient, son has divorced Patsy and patient remained living with Patsy. Patient states Pasty would leave patient in the home for hours by herself during the day and since patient was asked not to use the stove patient would make herself tomato sandwiches for each meal. Patient states Patsy was typically drunk and did not take her out anywhere. Patient states she was paying Pasty $800 of her $1300 SSA check to live in her home. Patient states Patsy then started asking that patient also pay for groceries and toiletries. Patient states last month Patsy took patients entire check and gave patient none. Patient states she added Patsy to her bank account and credit cared, but was not aware that Patsy took patient's name off of the debit card and the credit card account. Per patient, Tessie Fass does not have a job or any income. Patient states she was listening to her radio show last night, when Patsy came to patient already dressed stating she has to go to the hospital. Patient would rather  not return to Pasty's home and is agreeable to going to a [facility] or an [facility]. Patient states when she was at the SNF in [place] she became very depressed because she could not go outside, do any activities, and mostly had to stay in her bed. CSW observes patient is able to put on her own socks and shoes and ambulate on her own, with the assistance of her cane. Patient likes to take walks and be outside on her porch to smoke. Patient wants to be someone where she can stay active.   CSW saw in the chart that PCP made a referral to [facility] for placement, but patient was not eligible. CSW called and spoke with Thayer Headings [phone]) of [facility], who stated patient is over the income limit for Special Assistance Medicaid, but is under income for memory care. CSW explained patient does not have a dementia diagnosis. Thayer Headings stated private pay rates are $2500 per month. CSW spoke with Adult Protective Services (APS) Social Worker Rory Percy [phone]), who stated she just received the case last week and she was attempting to assist with SNF placement. Ms. Ayesha Rumpf stated she has contacted [facility] and [facility] [place] for a referral and is awaiting a call back. CSW expressed that per CSW and EDRN Amy's observation, patient does not appear to need a Skilled Nursing level of care. CSW stated patient does not qualify for Special Assistance Medicaid and cannot afford to pay privately for an ALF. Ms. Ayesha Rumpf stated patient's PCP provided her with an  FL-2, which is recommending Long Term Care (LTC). Ms. Ayesha Rumpf stated she is also looking into patient caretaker for exploitation. Ms. Ayesha Rumpf provided CSW with the number for patient's alleged POA-Diane Hutchen [phone]). CSW called and spoke with Diane who stated she is not the patient's POA and that the son-Gary is the POA. Diane also stated patient cannot live with her and she cannot help patient in anyway.    CSW called [facility] admissions coordinator Suezanne Cheshire [phone]) to follow up on Ms. Reese's referral. Ms Erasmo Score did not remember a referral, but was agreeable to review the patient and see if she thought patient would be appropriate for SNF. Ms. Erasmo Score reviewed patient referral and all notes and does not feel patient would bee appropriate for SNF. CSW also looked in chart and found 2 FL-2s, one of which recommended Assisted Living and another that recommended Skilled Nursing by the same PCP. CSW staffed case with Social Work [place], who is agreeable with ALF level of care. CSW left voicemail with Ms. Ayesha Rumpf for a call back.   FL-2 updated. CSW called the following ALFs/FCHs:  [facility] with owner Marcelo Baldy [phone], who stated not accepting new residents [facility] with owner Lavada Mesi at [phone], who stated she would see what she could do and call CSW back.  [facility]- Spoke with owner Nance Pear [phone], who stated he has no female beds [facility] with owner Eliezer Lofts [phone], who asked that [place] fax [phone]) FL-2 and stated she would assess patient on Wednesday at [place]. Ms. Eliezer Lofts also asked for PPD. CSW made Ms. Linette aware that patient only receives $1300/month and that she does not qualify for [place] Medicaid.  [facility]- Sent referral. Awaiting response.   CSW requested EDRN Amy place an order for a PPD. CSW continuing to follow for transitions of care/discharge needs.   Oretha Ellis, Latanya Presser, [place] Social Worker-ED [phone]

## 2017-06-04 NOTE — BH Assessment (Signed)
This Clinical research associate contact patient's son Tresea Zasada at [phone] to pick up patient from ED, left HIPPA compliant message for return call.  Contacted patient's daughter [place] at [phone], no voicemail setup.

## 2017-06-04 NOTE — ED Notes (Signed)

## 2017-06-04 NOTE — ED Notes (Signed)
Social work is at bedside

## 2017-06-04 NOTE — ED Notes (Signed)
BEHAVIORAL HEALTH ROUNDING Patient sleeping: No. Patient alert and oriented: yes Behavior appropriate: Yes.  ; If no, describe:  Nutrition and fluids offered: yes Toileting and hygiene offered: Yes  Sitter present: q15 minute observations and security monitoring Law enforcement present: Yes    

## 2017-06-04 NOTE — ED Notes (Signed)
ED  Is the patient under IVC or is there intent for IVC: no  Is the patient medically cleared: Yes.   Is there vacancy in the ED BHU: Yes.   Is the population mix appropriate for patient: - no  Geriatric - social work placement  Is the patient awaiting placement in inpatient or outpatient setting: social work placement Has the patient had a psychiatric consult: Yes.   Survey of unit performed for contraband, proper placement and condition of furniture, tampering with fixtures in bathroom, shower, and each patient room: Yes.  ; Findings:  APPEARANCE/BEHAVIOR Calm and cooperative NEURO ASSESSMENT Orientation: oriented x3   Hallucinations: No.None noted (Hallucinations) denies Speech: Normal Gait: normal - she uses a cane  RESPIRATORY ASSESSMENT Even  Unlabored respirations  CARDIOVASCULAR ASSESSMENT Pulses equal   regular rate  Skin warm and dry   GASTROINTESTINAL ASSESSMENT no GI complaint EXTREMITIES Full ROM  PLAN OF CARE Provide calm/safe environment. Vital signs assessed twice daily. ED BHU Assessment once each 12-hour shift. Collaborate with TTS daily or as condition indicates. Assure the ED provider has rounded once each shift. Provide and encourage hygiene. Provide redirection as needed. Assess for escalating behavior; address immediately and inform ED provider.  Assess family dynamic and appropriateness for visitation as needed: Yes.  ; If necessary, describe findings:  Educate the patient/family about BHU procedures/visitation: Yes.  ; If necessary, describe findings:

## 2017-06-04 NOTE — ED Provider Notes (Signed)
-----------------------------------------   7:16 AM on 06/04/2017 -----------------------------------------   Blood pressure (!) 149/79, pulse 68, temperature 97.8 F (36.6 C), temperature source Oral, resp. rate 20, height 5' (1.524 m), weight 67.6 kg (149 lb), SpO2 96 %.  The patient had no acute events since last update.  Calm and cooperative at this time.  Disposition is pending Psychiatry/Behavioral Medicine team recommendations.     Arnaldo Natal, MD 06/04/17 [phone]

## 2017-06-04 NOTE — ED Notes (Signed)
BEHAVIORAL HEALTH ROUNDING Patient sleeping: Yes.   Patient alert and oriented: eyes closed  Appears to be asleep Behavior appropriate: Yes.  ; If no, describe:  Nutrition and fluids offered: Yes  Toileting and hygiene offered: sleeping Sitter present: q 15 minute observations and security monitoring Law enforcement present: yes   

## 2017-06-04 NOTE — ED Notes (Signed)

## 2017-06-04 NOTE — ED Notes (Signed)
Pt moved to room #21.

## 2017-06-04 NOTE — ED Notes (Signed)
She is standing in the hallway talking with two nurses - pt crying and upset that her daughter in law dropped her off here - pt verbalizes "I know why so many old people are in nursing home - cause when nobody wants them they get dropped off - this is not right"  Pt reassured

## 2017-06-05 NOTE — ED Provider Notes (Signed)
-----------------------------------------   8:46 AM on 06/05/2017 -----------------------------------------   Blood pressure (!) 155/58, pulse 60, temperature 98 F (36.7 C), temperature source Oral, resp. rate 16, height 5' (1.524 m), weight 67.6 kg (149 lb), SpO2 97 %.  The patient had no acute events since last update.  Calm and cooperative at this time.  Disposition is pending social work placement     Rebecka Apley, MD 06/05/17 [phone]

## 2017-06-05 NOTE — ED Notes (Signed)
Pt given lunch tray.

## 2017-06-05 NOTE — Clinical Social Work Note (Addendum)
CSW left a voicemail for patient's Adult Management consultant (APS) Social Worker [name] [phone]). Awaiting a call back. Spoke with owner Ms. Linette [phone], who stated she is not able to make an offer for patient. CSW continuing to look for placement for patient. CSW continuing to follow for transitions of care/discharge needs.   Corlis Hove, Theresia Majors, [facility] Clinical Social Worker-ED [phone]

## 2017-06-05 NOTE — ED Notes (Signed)
VOL/Social Work for placement

## 2017-06-05 NOTE — ED Notes (Signed)
PT VOLUNTARY PENDING PLACEMENT. 

## 2017-06-05 NOTE — ED Notes (Signed)
Breakfast tray placed in pt room. Pt sleeping 

## 2017-06-05 NOTE — ED Notes (Signed)
Pt given snack at this time  

## 2017-06-05 NOTE — ED Notes (Signed)
Pt given supper tray.

## 2017-06-05 NOTE — ED Notes (Signed)
Pt eating breakfast tray in chair. Hospital bed given to pt at this time, stretcher taken out

## 2017-06-05 NOTE — ED Notes (Signed)
Pt sleeping comfortably, NAD noted. RR even and unlabored.

## 2017-06-06 MED ORDER — SENNOSIDES-DOCUSATE SODIUM 8.6-50 MG PO TABS
2.0000 | ORAL_TABLET | Freq: Two times a day (BID) | ORAL | Status: DC
  Administered 2017-06-06 – 2017-06-07 (×3): 2 via ORAL
  Filled 2017-06-06 (×4): qty 2

## 2017-06-06 MED ORDER — TUBERCULIN PPD 5 UNIT/0.1ML ID SOLN
5.0000 [IU] | Freq: Once | INTRADERMAL | Status: DC
  Administered 2017-06-06: 5 [IU] via INTRADERMAL
  Filled 2017-06-06: qty 0.1

## 2017-06-06 MED ORDER — MAGNESIUM HYDROXIDE 400 MG/5ML PO SUSP
15.0000 mL | Freq: Every day | ORAL | Status: DC | PRN
  Administered 2017-06-06: 15 mL via ORAL
  Filled 2017-06-06: qty 30

## 2017-06-06 NOTE — ED Notes (Signed)
Pt watching tv. pt alert

## 2017-06-06 NOTE — ED Notes (Signed)
Pt taken outdoors to walk, pt steady gait noted with this RN at pt assistance. NAD noted.

## 2017-06-06 NOTE — ED Notes (Signed)
[facility] at [facility].  Pt will be going to the family care home tomorrow.

## 2017-06-06 NOTE — ED Notes (Signed)
PT given snack at this time and water

## 2017-06-06 NOTE — ED Notes (Signed)
Pt resting quietly.

## 2017-06-06 NOTE — ED Notes (Signed)
Resumed care from [place] rn.  Pt alert.  sitting in recliner watching tv.

## 2017-06-06 NOTE — ED Notes (Signed)
Pt walked outdoors at this time, steady gait noted. This RN was with pt for assistance. Pt in NAD.

## 2017-06-06 NOTE — ED Notes (Signed)
Pt given lunch tray.

## 2017-06-06 NOTE — ED Notes (Signed)
Patient tied pillow case round his neck  removed by nurse Lincoln Maxin and Dr. Scotty Court was notified.

## 2017-06-06 NOTE — ED Notes (Signed)
Pt watching tv.  meds given.

## 2017-06-06 NOTE — ED Notes (Signed)
Tb skin test given to right forearm.

## 2017-06-06 NOTE — ED Notes (Signed)
Pt states she has not had bowel movement since arrival to ED, MD made aware. See orders

## 2017-06-06 NOTE — ED Notes (Signed)
PT sleeping comfortably at this time, NAD noted, RR even and unlabored

## 2017-06-06 NOTE — ED Notes (Signed)
Patient in room sleeping in chair.

## 2017-06-06 NOTE — ED Notes (Signed)
Pt eating dinner tray now 

## 2017-06-06 NOTE — ED Provider Notes (Signed)
-----------------------------------------   6:49 AM on 06/06/2017 -----------------------------------------   Blood pressure (!) 161/53, pulse 65, temperature 98 F (36.7 C), temperature source Oral, resp. rate 16, height 1.524 m (5'), weight 67.6 kg (149 lb), SpO2 96 %.  The patient had no acute events since last update.  Calm and cooperative at this time.  Disposition is pending Placement by social work.     Loleta Rose, MD 06/06/17 [phone]

## 2017-06-06 NOTE — ED Notes (Signed)
VOL/Pending placement 

## 2017-06-06 NOTE — Clinical Social Work Note (Addendum)
CSW received a call from Flint Melter of [facility] [phone]-, who confirmed she received FL-2 for patient and would like to assess patient today between 3pm and 5pm. Ms. Smitty Cords aware patient is private pay and limited in funding. CSW agreeable and updated RN [place]. CSW left a 3rd voicemail for [name] [phone]) as she has not called CSW back in 3 days.   6:30pm - CSW interviewed by Flint Melter of [facility]. Ms. Smitty Cords informed CSW that she can accept patient on Friday 4/26 between 3pm and 6pm. Ms. Smitty Cords would need discharge instructions, PPD reading, and updated FL-2 reflecting 4/26 date.   6:56pm - CSW informed by Suzy Bouchard that patient's PPD was ordered but never administered. Patient to be given PPD this evening. CSW called Flint Melter to update her that PPD will be given tonight and patient can be brought back to ED to be read. Ms. Smitty Cords still agreeable to take patient on Friday. CSW continuing to follow for discharge needs.   Corlis Hove, Theresia Majors, [facility] Clinical Social Worker-ED [phone]

## 2017-06-06 NOTE — ED Notes (Signed)
Pt eating lunch tray at this time, NAD noted

## 2017-06-07 ENCOUNTER — Emergency Department: Payer: Medicare HMO

## 2017-06-07 MED ORDER — PANTOPRAZOLE SODIUM 40 MG PO TBEC: 40.0000 mg | DELAYED_RELEASE_TABLET | Freq: Every day | ORAL | 1 refills | Status: DC

## 2017-06-07 MED ORDER — PHENYTOIN SODIUM EXTENDED 100 MG PO CAPS: 200.0000 mg | ORAL_CAPSULE | Freq: Every day | ORAL | 1 refills | Status: DC

## 2017-06-07 MED ORDER — QUETIAPINE FUMARATE 50 MG PO TABS: 50.0000 mg | ORAL_TABLET | Freq: Every day | ORAL | 1 refills | Status: DC

## 2017-06-07 MED ORDER — ATORVASTATIN CALCIUM 10 MG PO TABS: 10.0000 mg | ORAL_TABLET | Freq: Every day | ORAL | 1 refills | Status: DC

## 2017-06-07 MED ORDER — FLUTICASONE-SALMETEROL 100-50 MCG/DOSE IN AEPB: 1.0000 | INHALATION_SPRAY | Freq: Two times a day (BID) | RESPIRATORY_TRACT | 1 refills | Status: DC

## 2017-06-07 MED ORDER — NITROGLYCERIN 0.4 MG SL SUBL: 0.4000 mg | SUBLINGUAL_TABLET | SUBLINGUAL | 1 refills | Status: DC | PRN

## 2017-06-07 MED ORDER — SERTRALINE HCL 100 MG PO TABS
100.0000 mg | ORAL_TABLET | Freq: Every day | ORAL | 1 refills | Status: DC
Start: 2017-06-07 — End: 2017-08-13

## 2017-06-07 MED ORDER — GABAPENTIN 300 MG PO CAPS: 600.0000 mg | ORAL_CAPSULE | Freq: Three times a day (TID) | ORAL | 1 refills | Status: DC | PRN

## 2017-06-07 MED ORDER — ALBUTEROL SULFATE HFA 108 (90 BASE) MCG/ACT IN AERS: 2.0000 | INHALATION_SPRAY | Freq: Four times a day (QID) | RESPIRATORY_TRACT | 1 refills | Status: DC | PRN

## 2017-06-07 MED ORDER — BUSPIRONE HCL 7.5 MG PO TABS: 7.5000 mg | ORAL_TABLET | Freq: Two times a day (BID) | ORAL | 1 refills | Status: DC

## 2017-06-07 MED ORDER — ALBUTEROL SULFATE (2.5 MG/3ML) 0.083% IN NEBU: INHALATION_SOLUTION | RESPIRATORY_TRACT | 1 refills | Status: DC

## 2017-06-07 MED ORDER — ASPIRIN 81 MG PO TABS: 81.0000 mg | ORAL_TABLET | Freq: Every day | ORAL | 1 refills | Status: DC

## 2017-06-07 MED ORDER — CARVEDILOL 3.125 MG PO TABS: 3.1250 mg | ORAL_TABLET | Freq: Two times a day (BID) | ORAL | 1 refills | Status: DC

## 2017-06-07 MED ORDER — HYDROCODONE-ACETAMINOPHEN 5-325 MG PO TABS: 1.0000 | ORAL_TABLET | Freq: Two times a day (BID) | ORAL | 0 refills | Status: DC | PRN

## 2017-06-07 MED ORDER — FUROSEMIDE 20 MG PO TABS: 20.0000 mg | ORAL_TABLET | Freq: Every day | ORAL | 1 refills | Status: DC

## 2017-06-07 MED ORDER — MAGNESIUM CITRATE PO SOLN
1.0000 | Freq: Once | ORAL | Status: AC
  Administered 2017-06-07: 1 via ORAL
  Filled 2017-06-07: qty 296

## 2017-06-07 NOTE — ED Notes (Signed)
Pt watching tv

## 2017-06-07 NOTE — ED Provider Notes (Signed)
patient has been stable here. She was worried about constipation but recently had a very large bowel movement and feels much better. Family is unable to care for her but she has been placed in a group home.   Arnaldo Natal, MD 06/07/17 1420

## 2017-06-07 NOTE — NC FL2 (Addendum)
[place] MEDICAID FL2 LEVEL OF CARE SCREENING TOOL     IDENTIFICATION  Patient Name: Sandy Daniel Birthdate: 06-Apr-1928 Sex: female Admission Date (Current Location): 06/03/2017  [facility] and [place] Number:  Chiropodist and Address:  [facility], [address] [place], [place] [address]      Provider Number: [id number]  Attending Physician Name and Address:  No att. providers found  Relative Name and Phone Number:       Current Level of Care: Hospital Recommended Level of Care: Assisted Living Facility Prior Approval Number:    Date Approved/Denied:   PASRR Number:    Discharge Plan: Domiciliary (Rest home)    Current Diagnoses: Patient Active Problem List   Diagnosis Date Noted  . 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 Major depressive disorder with single episode, in partial remission (HCC) 05/15/2017  . Neck pain 01/31/2017  . Chronic right shoulder pain 01/31/2017  . Chronic bilateral thoracic back pain 12/04/2016  . Muscle spasm of back 10/22/2016  . (HFpEF) heart failure with preserved ejection fraction (HCC) 09/11/2016  . Anxiety 07/16/2016  . Chronic bilateral low back pain 07/16/2016  . Unstable gait 07/16/2016  . Controlled substance agreement signed 03/27/2016  . Dysphagia 03/26/2016  . Panic disorder 03/26/2016  . CAD (coronary artery disease)   . Seizure disorder (HCC)   . Macular degeneration of both eyes   . B12 deficiency   . COPD (chronic obstructive pulmonary disease) (HCC)   . HTN (hypertension)   . Hyperlipidemia   . Arthritis   . Depression   . GERD (gastroesophageal reflux disease)   . Physiological tremor 02/18/2014    Orientation RESPIRATION BLADDER Height & Weight     Self, Time, Situation, Place  Normal Continent Weight: 149 lb (67.6 kg) Height:  5' (152.4 cm)  BEHAVIORAL SYMPTOMS/MOOD NEUROLOGICAL BOWEL NUTRITION STATUS      Continent Diet(Normal Diet)   AMBULATORY STATUS COMMUNICATION OF NEEDS Skin   Supervision(Utilizes cane for ambulation) Verbally Normal                       Personal Care Assistance Level of Assistance  Bathing, Feeding, Dressing Bathing Assistance: Limited assistance Feeding assistance: Independent Dressing Assistance: Limited assistance     Functional Limitations Info  Sight, Hearing, Speech Sight Info: Impaired(Macular degeneration in both eyes) Hearing Info: Impaired Speech Info: Adequate    SPECIAL CARE FACTORS FREQUENCY                       Contractures Contractures Info: Not present    Additional Factors Info  Code Status, Allergies, Psychotropic Code Status Info: Full Allergies Info: Motrin Ibuprofen, Tolmetin, Alendronate, Other, Tramadol Hcl Psychotropic Info: See Medication List         Current Medications (06/07/2017):  This is the current hospital active medication list Current Facility-Administered Medications  Medication Dose Route Frequency Provider Last Rate Last Dose  . albuterol (PROVENTIL HFA;VENTOLIN HFA) 108 (90 Base) MCG/ACT inhaler 2 puff  2 puff Inhalation Q6H PRN Nita Sickle, MD   2 puff at 06/03/17 2359  . aspirin EC tablet 81 mg  81 mg Oral Daily Don Perking, [place], MD   81 mg at 06/06/17 1011  . atorvastatin (LIPITOR) tablet 10 mg  10 mg Oral Daily Don Perking, [place], MD   10 mg at 06/06/17 1011  . busPIRone (BUSPAR) tablet 7.5 mg  7.5 mg Oral BID Nita Sickle, MD  7.5 mg at 06/06/17 2149  . carvedilol (COREG) tablet 3.125 mg  3.125 mg Oral BID WC Don Perking, [place], MD   3.125 mg at 06/06/17 1706  . furosemide (LASIX) tablet 20 mg  20 mg Oral Daily Don Perking, [place], MD   20 mg at 06/06/17 1011  . gabapentin (NEURONTIN) tablet 600 mg  600 mg Oral TID PRN Nita Sickle, MD   600 mg at 06/06/17 0127  . HYDROcodone-acetaminophen (NORCO/VICODIN) 5-325 MG per tablet 1 tablet  1 tablet Oral BID PRN Nita Sickle, MD   1 tablet at 06/07/17  0829  . magnesium hydroxide (MILK OF MAGNESIA) suspension 15 mL  15 mL Oral Daily PRN Sharman Cheek, MD   15 mL at 06/06/17 1359  . mometasone-formoterol (DULERA) 100-5 MCG/ACT inhaler 2 puff  2 puff Inhalation BID Nita Sickle, MD   2 puff at 06/07/17 [phone]  . pantoprazole (PROTONIX) EC tablet 40 mg  40 mg Oral Daily Don Perking, [place], MD   40 mg at 06/06/17 1011  . phenytoin (DILANTIN) ER capsule 200 mg  200 mg Oral QHS Don Perking, [place], MD   200 mg at 06/06/17 2145  . QUEtiapine (SEROQUEL) tablet 50 mg  50 mg Oral QHS Don Perking, [place], MD   50 mg at 06/06/17 2145  . senna-docusate (Senokot-S) tablet 2 tablet  2 tablet Oral BID Sharman Cheek, MD   2 tablet at 06/06/17 2146  . sertraline (ZOLOFT) tablet 100 mg  100 mg Oral Daily Don Perking, [place], MD   100 mg at 06/06/17 1011  . tuberculin injection 5 Units  5 Units Intradermal Once Nita Sickle, MD   5 Units at 06/06/17 2025   Current Outpatient Medications  Medication Sig Dispense Refill  . albuterol (PROVENTIL HFA;VENTOLIN HFA) 108 (90 Base) MCG/ACT inhaler Inhale 2 puffs into the lungs every 6 (six) hours as needed for wheezing or shortness of breath. 1 Inhaler 5  . albuterol (PROVENTIL) (2.5 MG/3ML) 0.083% nebulizer solution Take 3 mLs (2.5 mg total) by nebulization every 6 (six) hours as needed for Wheezing. 75 mL 3  . aspirin 81 MG tablet Take 1 tablet (81 mg total) by mouth daily. 30 tablet 11  . atorvastatin (LIPITOR) 10 MG tablet Take 1 tablet (10 mg total) by mouth daily. 90 tablet 1  . busPIRone (BUSPAR) 7.5 MG tablet Take 1 tablet (7.5 mg total) by mouth 2 (two) times daily. 60 tablet 2  . carvedilol (COREG) 3.125 MG tablet Take 1 tablet (3.125 mg total) by mouth 2 (two) times daily with a meal. 180 tablet 1  . Fluticasone-Salmeterol (ADVAIR) 100-50 MCG/DOSE AEPB Inhale 1 puff into the lungs 2 (two) times daily. 1 each 3  . furosemide (LASIX) 20 MG tablet Take 20 mg by mouth daily. 1/2 tab    . gabapentin  (NEURONTIN) 300 MG capsule Take 2 capsules (600 mg total) by mouth 3 (three) times daily as needed (moderate pain). 270 capsule 1  . HYDROcodone-acetaminophen (NORCO/VICODIN) 5-325 MG tablet Take 1 tablet by mouth 2 (two) times daily as needed for moderate pain. 60 tablet 0  . nitroGLYCERIN (NITROSTAT) 0.4 MG SL tablet Place 1 tablet (0.4 mg total) under the tongue every 5 (five) minutes as needed for chest pain. 20 tablet 5  . pantoprazole (PROTONIX) 40 MG tablet Take 1 tablet (40 mg total) by mouth daily. 90 tablet 1  . phenytoin (DILANTIN) 100 MG ER capsule Take 2 capsules (200 mg total) by mouth at bedtime. Take 2 by mouth at bedtime 180 capsule 1  .  QUEtiapine (SEROQUEL) 50 MG tablet Take 1 tablet (50 mg total) by mouth at bedtime. 90 tablet 1  . sertraline (ZOLOFT) 100 MG tablet Take 1 tablet (100 mg total) by mouth daily. 90 tablet 1     Discharge Medications: Please see discharge summary for a list of discharge medications.  Relevant Imaging Results:  Relevant Lab Results:   Additional Information SSN: [id number]  Cheron Schaumann, [place]

## 2017-06-07 NOTE — ED Notes (Signed)
Pt reports has not had bowel movement in 7 days, requesting something other than senna. Dr. Darnelle Catalan notified

## 2017-06-07 NOTE — ED Notes (Signed)
[facility] today at 345 pm. Remains in recliner. NAD.  No needs currently.

## 2017-06-07 NOTE — Progress Notes (Signed)
LCSW called and left message for [name] APS-DSS [phone] awaiting a call back   Sandy Daniel [place] LCSW [phone]

## 2017-06-07 NOTE — ED Notes (Signed)
Pt ambulated back to bed without difficulty, pt lying on left side to go to sleep.

## 2017-06-07 NOTE — ED Notes (Signed)
Pt sitting in chair at this time watching TV, pt in NAD at this time.

## 2017-06-07 NOTE — ED Notes (Signed)
Pt ate all breakfast tray

## 2017-06-07 NOTE — ED Provider Notes (Signed)
-----------------------------------------   7:14 AM on 06/07/2017 -----------------------------------------   Blood pressure (!) 149/52, pulse 61, temperature 98.5 F (36.9 C), temperature source Oral, resp. rate 17, height 5' (1.524 m), weight 67.6 kg (149 lb), SpO2 94 %.  The patient had no acute events since last update.  Calm and cooperative at this time.  Disposition is pending clinical social work team recommendations.     Irean Hong, MD 06/07/17 [phone]

## 2017-06-07 NOTE — ED Notes (Addendum)
Sitting on toilet still unable to have bowel movement. Lunch tray in room.

## 2017-06-07 NOTE — ED Notes (Signed)
PT VOLUNTARY PENDING PLACEMENT. 

## 2017-06-07 NOTE — Progress Notes (Signed)
LCSW spoke to ED RN and patient did have an actual TB test. LCSW requested a chest x-ray and a doctors note stating the x-ray is all clear and no TB present. The patient will not be able to DC unless this e ray occurs.  EDRN will consult Dr Darnelle Catalan.  [employer] LCSW [phone]

## 2017-06-07 NOTE — ED Notes (Signed)
Pt sleeping. 

## 2017-06-07 NOTE — ED Notes (Signed)
Report off to kasey rn  

## 2017-06-07 NOTE — ED Notes (Signed)
Sitting on toilet attempting to use restroom.

## 2017-06-07 NOTE — Progress Notes (Signed)
LCSW consulted with EDRN and EDP and was informared Radiaology will change information on x-ray 06/03/17 to reflect no TB. Awaiting  Consult once this task is completed.  LCSW [employer] LCSW [phone]

## 2017-06-07 NOTE — ED Notes (Signed)
Left with sharron with the [facility].  Ambulatory. Stable on discharge.

## 2017-06-07 NOTE — ED Notes (Signed)
Given dinner tray. Still waiting on ride to group home.

## 2017-06-07 NOTE — Progress Notes (Signed)
LCSW received call from [facility], and [name] APS was notified patient was to go to [facility] Received call from [facility] from [facility] and patient will be picked up at 3:45pm today. Dr Darnelle Catalan to sign Fl2 no further needs.  [employer] LCSW [phone]

## 2017-06-07 NOTE — ED Notes (Signed)
Ambulatory around room without assistance.

## 2017-06-07 NOTE — ED Notes (Signed)
Has only eaten couple bites of orange for lunch

## 2017-06-07 NOTE — Discharge Instructions (Signed)
please continue to take all your medicines. Please return here for any further problems. At all protective services and your group home will monitor your care

## 2017-06-12 ENCOUNTER — Other Ambulatory Visit: Payer: Self-pay

## 2017-06-12 ENCOUNTER — Ambulatory Visit (INDEPENDENT_AMBULATORY_CARE_PROVIDER_SITE_OTHER): Payer: Medicare HMO | Admitting: Nurse Practitioner

## 2017-06-12 ENCOUNTER — Encounter: Payer: Self-pay | Admitting: Nurse Practitioner

## 2017-06-12 VITALS — BP 130/47 | HR 70 | Temp 97.8°F | Resp 18 | Ht 60.0 in | Wt 147.0 lb

## 2017-06-12 DIAGNOSIS — E785 Hyperlipidemia, unspecified: Secondary | ICD-10-CM | POA: Diagnosis not present

## 2017-06-12 DIAGNOSIS — Z7189 Other specified counseling: Secondary | ICD-10-CM

## 2017-06-12 DIAGNOSIS — I2510 Atherosclerotic heart disease of native coronary artery without angina pectoris: Secondary | ICD-10-CM

## 2017-06-12 DIAGNOSIS — Z593 Problems related to living in residential institution: Secondary | ICD-10-CM | POA: Diagnosis not present

## 2017-06-12 MED ORDER — NITROGLYCERIN 0.4 MG SL SUBL: 0.4000 mg | SUBLINGUAL_TABLET | SUBLINGUAL | 1 refills | Status: AC | PRN

## 2017-06-12 MED ORDER — ATORVASTATIN CALCIUM 10 MG PO TABS: 10.0000 mg | ORAL_TABLET | Freq: Every day | ORAL | 1 refills | Status: DC

## 2017-06-12 NOTE — Patient Instructions (Addendum)
Wende Bushy,   Thank you for coming in to clinic today.  1. No medication changes today.  If you need to see me sooner, please call.  Please schedule a follow-up appointment with [name], AGNP. Return in about 3 months (around 09/12/2017) for hypertension.  If you have any other questions or concerns, please feel free to call the clinic or send a message through MyChart. You may also schedule an earlier appointment if necessary.  You will receive a survey after today's visit either digitally by e-mail or paper by [employer]. Your experiences and feedback matter to [place].  Please respond so we know how we are doing as we provide care for you.   [name], DNP, AGNP-BC Adult Gerontology Nurse Practitioner [facility], [facility]

## 2017-06-12 NOTE — Progress Notes (Signed)
Subjective:    Patient ID: Sandy Daniel, female    DOB: 1928-08-29, 84 y.o.   MRN: [id number]  Sandy Daniel is a 84 y.o. female presenting on 06/12/2017 for Advice Only (pt need FL-2 forms filled out )   HPI Pt has been placed in a family care home for supervision and assistance with ADLs and iADLs.  She presents today for paperwork completion to provide recommendations to staff at the family care home. Sandy Daniel is very pleased with her current living environment.  She states that she no longer has anyone "yelling at her" and always has help when she needs it.  She is also encouraged to participate in activities as she is able to keep independence. -She is accompanied today by Sandy Daniel" who is her daytime caregiver. -Patient complains of general body aches and pains today.  Her next pain management appointment is tomorrow 06/13/2017.  She has currently run out of her oxycodone.  No refills are provided for today.  Flip mentions that she did not come to her family care home with her prescription.  It is possible in the transition that her oxycodone remains at her prior residence. Sandy Daniel reports no ongoing concerns with medical conditions and is continuing to tolerate medications well without side effects.  Social History   Tobacco Use  . Smoking status: Current Every Day Smoker    Packs/day: 0.25    Years: 50.00    Pack years: 12.50    Types: Cigarettes  . Smokeless tobacco: Never Used  . Tobacco comment: 10 a day   Substance Use Topics  . Alcohol use: No  . Drug use: No    Review of Systems Per HPI unless specifically indicated above     Objective:    BP (!) 130/47 (BP Location: Right Arm, Patient Position: Sitting, Cuff Size: Normal)   Pulse 70   Temp 97.8 F (36.6 C) (Oral)   Resp 18   Ht 5' (1.524 m)   Wt 147 lb (66.7 kg)   SpO2 94%   BMI 28.71 kg/m   Wt Readings from Last 3 Encounters:  06/12/17 147 lb (66.7 kg)  06/03/17 149 lb (67.6 kg)  05/14/17  149 lb (67.6 kg)    Physical Exam  Constitutional: She is oriented to person, place, and time. She appears well-developed and well-nourished. No distress.  HENT:  Head: Normocephalic and atraumatic.  Neck: Normal range of motion. Neck supple. Carotid bruit is not present.  Cardiovascular: Normal rate, regular rhythm, S1 normal, S2 normal, normal heart sounds and intact distal pulses.  Pulmonary/Chest: Effort normal and breath sounds normal. No respiratory distress.  Musculoskeletal: She exhibits no edema (+2 right pedal; +1 left pedal) or tenderness.  Neurological: She is alert and oriented to person, place, and time.  Skin: Skin is warm and dry.  Psychiatric: She has a normal mood and affect. Her behavior is normal.  Vitals reviewed.   Results for orders placed or performed during the hospital encounter of 06/03/17  Comprehensive metabolic panel  Result Value Ref Range   Sodium 138 135 - 145 mmol/L   Potassium 4.3 3.5 - 5.1 mmol/L   Chloride 103 101 - 111 mmol/L   CO2 28 22 - 32 mmol/L   Glucose, Bld 91 65 - 99 mg/dL   BUN 23 (H) 6 - 20 mg/dL   Creatinine, Ser 8.29 0.44 - 1.00 mg/dL   Calcium 8.8 (L) 8.9 - 10.3 mg/dL   Total Protein 7.2 6.5 - 8.1  g/dL   Albumin 3.7 3.5 - 5.0 g/dL   AST 20 15 - 41 U/L   ALT 13 (L) 14 - 54 U/L   Alkaline Phosphatase 141 (H) 38 - 126 U/L   Total Bilirubin 0.5 0.3 - 1.2 mg/dL   GFR calc non Af Amer >60 >60 mL/min   GFR calc Af Amer >60 >60 mL/min   Anion gap 7 5 - 15  Ethanol  Result Value Ref Range   Alcohol, Ethyl (B) <10 <10 mg/dL  Urine Drug Screen, Qualitative  Result Value Ref Range   Tricyclic, Ur Screen NONE DETECTED NONE DETECTED   Amphetamines, Ur Screen NONE DETECTED NONE DETECTED   MDMA (Ecstasy)Ur Screen NONE DETECTED NONE DETECTED   Cocaine Metabolite,Ur [place] NONE DETECTED NONE DETECTED   Opiate, Ur Screen NONE DETECTED NONE DETECTED   Phencyclidine (PCP) Ur S NONE DETECTED NONE DETECTED   Cannabinoid 50 Ng, Ur [place] NONE DETECTED  NONE DETECTED   Barbiturates, Ur Screen NONE DETECTED NONE DETECTED   Benzodiazepine, Ur Scrn NONE DETECTED NONE DETECTED   Methadone Scn, Ur NONE DETECTED NONE DETECTED  Sedimentation rate  Result Value Ref Range   Sed Rate 35 (H) 0 - 30 mm/hr  Acetaminophen level  Result Value Ref Range   Acetaminophen (Tylenol), Serum <10 (L) 10 - 30 ug/mL  Salicylate level  Result Value Ref Range   Salicylate Lvl <7.0 2.8 - 30.0 mg/dL  CBC with Differential  Result Value Ref Range   WBC 5.3 3.6 - 11.0 K/uL   RBC 4.42 3.80 - 5.20 MIL/uL   Hemoglobin 13.4 12.0 - 16.0 g/dL   HCT 50.3 88.8 - 28.0 %   MCV 90.4 80.0 - 100.0 fL   MCH 30.2 26.0 - 34.0 pg   MCHC 33.4 32.0 - 36.0 g/dL   RDW 03.4 91.7 - 91.5 %   Platelets 264 150 - 440 K/uL   Neutrophils Relative % 52 %   Lymphocytes Relative 32 %   Monocytes Relative 14 %   Eosinophils Relative 1 %   Basophils Relative 1 %   Neutro Abs 2.7 1.4 - 6.5 K/uL   Lymphs Abs 1.7 1.0 - 3.6 K/uL   Monocytes Absolute 0.7 0.2 - 0.9 K/uL   Eosinophils Absolute 0.1 0 - 0.7 K/uL   Basophils Absolute 0.1 0 - 0.1 K/uL   Smear Review MORPHOLOGY UNREMARKABLE   Urinalysis, Complete w Microscopic  Result Value Ref Range   Color, Urine YELLOW (A) YELLOW   APPearance CLEAR (A) CLEAR   Specific Gravity, Urine 1.014 1.005 - 1.030   pH 6.0 5.0 - 8.0   Glucose, UA NEGATIVE NEGATIVE mg/dL   Hgb urine dipstick NEGATIVE NEGATIVE   Bilirubin Urine NEGATIVE NEGATIVE   Ketones, ur NEGATIVE NEGATIVE mg/dL   Protein, ur NEGATIVE NEGATIVE mg/dL   Nitrite NEGATIVE NEGATIVE   Leukocytes, UA SMALL (A) NEGATIVE   RBC / HPF 0-5 0 - 5 RBC/hpf   WBC, UA 0-5 0 - 5 WBC/hpf   Bacteria, UA RARE (A) NONE SEEN   Squamous Epithelial / LPF 0-5 (A) NONE SEEN   Mucus PRESENT    Hyaline Casts, UA PRESENT       Assessment & Plan:   Problem List Items Addressed This Visit      Cardiovascular and Mediastinum   CAD (coronary artery disease) Medication refills were requested and  provided.   Relevant Medications   atorvastatin (LIPITOR) 10 MG tablet   nitroGLYCERIN (NITROSTAT) 0.4 MG SL tablet  Other   Hyperlipidemia Medication refills were requested and provided.   Relevant Medications   atorvastatin (LIPITOR) 10 MG tablet   nitroGLYCERIN (NITROSTAT) 0.4 MG SL tablet    Other Visit Diagnoses    Advance care planning    -  Primary   Lives in group home    Pt presents today as a new resident of family care home requiring establishment of plan of care.  Very supportive environment and engaged caregiver present today. Paperwork (FL 2, care plan) is completed with current plan of care.  Pt has her nitroglycerin with her and has not needed to take it in about 4 weeks.  She will continue to administer this medication herself, but all other medications will be administered with assistance as patient has very poor vision and cannot do this herself.  No changes in care plan today.  As a resident in a new facility, advance care planning is very important for her new caregivers.     A voluntary discussion about advance care planning including the explanation and discussion of advance directives was extensively discussed with the patient.  Explanation about the health care proxy and living will was reviewed and booklet of forms with explanation of how to fill them out was given.  During this discussion, the patient was able to identify a health care proxy as Maisie Fus "Flip" her new family care home caregiver and plans to fill out the paperwork required.  Patient was offered a separate Advance Care Planning visit for further assistance with forms.   Time spent: 10 minutes         Individuals present: Maisie Fus "Flip" and Solene Spooner  Meds ordered this encounter  Medications  . atorvastatin (LIPITOR) 10 MG tablet    Sig: Take 1 tablet (10 mg total) by mouth daily.    Dispense:  90 tablet    Refill:  1    Order Specific Question:   Supervising Provider    Answer:    Smitty Cords [[id number]]  . nitroGLYCERIN (NITROSTAT) 0.4 MG SL tablet    Sig: Place 1 tablet (0.4 mg total) under the tongue every 5 (five) minutes as needed for chest pain.    Dispense:  20 tablet    Refill:  1    Order Specific Question:   Supervising Provider    Answer:   Smitty Cords [[id number]]    Follow up plan: Return in about 3 months (around 09/12/2017) for hypertension. A total of 25 minutes was spent face-to-face with this patient. Greater than 50% of this time was spent in counseling and coordination of care with the patient.    [name], DNP, AGPCNP-BC Adult Gerontology Primary Care Nurse Practitioner [facility] 06/12/2017, 12:00 PM

## 2017-06-13 ENCOUNTER — Ambulatory Visit
Payer: Managed Care, Other (non HMO) | Attending: Student in an Organized Health Care Education/Training Program | Admitting: Student in an Organized Health Care Education/Training Program

## 2017-06-13 ENCOUNTER — Encounter: Payer: Self-pay | Admitting: Student in an Organized Health Care Education/Training Program

## 2017-06-13 ENCOUNTER — Other Ambulatory Visit: Payer: Self-pay

## 2017-06-13 VITALS — BP 122/65 | HR 69 | Temp 98.4°F | Resp 16 | Ht 60.0 in | Wt 147.0 lb

## 2017-06-13 DIAGNOSIS — Z7982 Long term (current) use of aspirin: Secondary | ICD-10-CM | POA: Insufficient documentation

## 2017-06-13 DIAGNOSIS — M5442 Lumbago with sciatica, left side: Secondary | ICD-10-CM | POA: Insufficient documentation

## 2017-06-13 DIAGNOSIS — M5136 Other intervertebral disc degeneration, lumbar region: Secondary | ICD-10-CM | POA: Diagnosis not present

## 2017-06-13 DIAGNOSIS — I10 Essential (primary) hypertension: Secondary | ICD-10-CM | POA: Diagnosis not present

## 2017-06-13 DIAGNOSIS — E785 Hyperlipidemia, unspecified: Secondary | ICD-10-CM | POA: Diagnosis not present

## 2017-06-13 DIAGNOSIS — M542 Cervicalgia: Secondary | ICD-10-CM | POA: Insufficient documentation

## 2017-06-13 DIAGNOSIS — M5441 Lumbago with sciatica, right side: Secondary | ICD-10-CM | POA: Diagnosis not present

## 2017-06-13 DIAGNOSIS — K5900 Constipation, unspecified: Secondary | ICD-10-CM | POA: Diagnosis not present

## 2017-06-13 DIAGNOSIS — M47812 Spondylosis without myelopathy or radiculopathy, cervical region: Secondary | ICD-10-CM | POA: Diagnosis not present

## 2017-06-13 DIAGNOSIS — I252 Old myocardial infarction: Secondary | ICD-10-CM | POA: Insufficient documentation

## 2017-06-13 DIAGNOSIS — G8929 Other chronic pain: Secondary | ICD-10-CM | POA: Diagnosis not present

## 2017-06-13 DIAGNOSIS — K219 Gastro-esophageal reflux disease without esophagitis: Secondary | ICD-10-CM | POA: Diagnosis not present

## 2017-06-13 DIAGNOSIS — F410 Panic disorder [episodic paroxysmal anxiety] without agoraphobia: Secondary | ICD-10-CM | POA: Insufficient documentation

## 2017-06-13 DIAGNOSIS — M25552 Pain in left hip: Secondary | ICD-10-CM | POA: Diagnosis not present

## 2017-06-13 DIAGNOSIS — G40909 Epilepsy, unspecified, not intractable, without status epilepticus: Secondary | ICD-10-CM | POA: Diagnosis not present

## 2017-06-13 DIAGNOSIS — I5032 Chronic diastolic (congestive) heart failure: Secondary | ICD-10-CM | POA: Diagnosis not present

## 2017-06-13 DIAGNOSIS — I700 Atherosclerosis of aorta: Secondary | ICD-10-CM | POA: Diagnosis not present

## 2017-06-13 DIAGNOSIS — F329 Major depressive disorder, single episode, unspecified: Secondary | ICD-10-CM | POA: Diagnosis not present

## 2017-06-13 DIAGNOSIS — J449 Chronic obstructive pulmonary disease, unspecified: Secondary | ICD-10-CM | POA: Diagnosis not present

## 2017-06-13 DIAGNOSIS — E538 Deficiency of other specified B group vitamins: Secondary | ICD-10-CM | POA: Diagnosis not present

## 2017-06-13 DIAGNOSIS — R262 Difficulty in walking, not elsewhere classified: Secondary | ICD-10-CM | POA: Insufficient documentation

## 2017-06-13 DIAGNOSIS — R1310 Dysphagia, unspecified: Secondary | ICD-10-CM | POA: Diagnosis not present

## 2017-06-13 DIAGNOSIS — M25551 Pain in right hip: Secondary | ICD-10-CM

## 2017-06-13 DIAGNOSIS — Z79899 Other long term (current) drug therapy: Secondary | ICD-10-CM | POA: Insufficient documentation

## 2017-06-13 DIAGNOSIS — I2510 Atherosclerotic heart disease of native coronary artery without angina pectoris: Secondary | ICD-10-CM | POA: Insufficient documentation

## 2017-06-13 DIAGNOSIS — M5481 Occipital neuralgia: Secondary | ICD-10-CM | POA: Insufficient documentation

## 2017-06-13 DIAGNOSIS — M546 Pain in thoracic spine: Secondary | ICD-10-CM | POA: Insufficient documentation

## 2017-06-13 DIAGNOSIS — J9811 Atelectasis: Secondary | ICD-10-CM | POA: Insufficient documentation

## 2017-06-13 DIAGNOSIS — Z955 Presence of coronary angioplasty implant and graft: Secondary | ICD-10-CM | POA: Insufficient documentation

## 2017-06-13 DIAGNOSIS — F419 Anxiety disorder, unspecified: Secondary | ICD-10-CM | POA: Insufficient documentation

## 2017-06-13 DIAGNOSIS — M25511 Pain in right shoulder: Secondary | ICD-10-CM | POA: Insufficient documentation

## 2017-06-13 DIAGNOSIS — G894 Chronic pain syndrome: Secondary | ICD-10-CM | POA: Diagnosis not present

## 2017-06-13 DIAGNOSIS — M5030 Other cervical disc degeneration, unspecified cervical region: Secondary | ICD-10-CM | POA: Diagnosis not present

## 2017-06-13 DIAGNOSIS — Z886 Allergy status to analgesic agent status: Secondary | ICD-10-CM | POA: Insufficient documentation

## 2017-06-13 DIAGNOSIS — Z951 Presence of aortocoronary bypass graft: Secondary | ICD-10-CM | POA: Insufficient documentation

## 2017-06-13 MED ORDER — HYDROCODONE-ACETAMINOPHEN 7.5-325 MG PO TABS: 1.0000 | ORAL_TABLET | Freq: Two times a day (BID) | ORAL | 0 refills | Status: DC | PRN

## 2017-06-13 NOTE — Progress Notes (Signed)
Nursing Pain Medication Assessment:  Safety precautions to be maintained throughout the outpatient stay will include: orient to surroundings, keep bed in low position, maintain call bell within reach at all times, provide assistance with transfer out of bed and ambulation.  Medication Inspection Compliance: Pill count conducted under aseptic conditions, in front of the patient. Neither the pills nor the bottle was removed from the patient's sight at any time. Once count was completed pills were immediately returned to the patient in their original bottle.  Medication: Hydrocodone/APAP Pill/Patch Count: 0 of 60 pills remain Pill/Patch Appearance: Markings consistent with prescribed medication Bottle Appearance: Standard pharmacy container. Clearly labeled. Filled Date: 4 / 2 / 2019 Last Medication intake:  Ran out of medicine more than 48 hours ago

## 2017-06-13 NOTE — Progress Notes (Signed)
Patient's Name: Sandy Daniel  MRN: [id number]  Referring Provider: Mikey College, *  DOB: 07-26-1928  PCP: Mikey College, NP  DOS: 06/13/2017  Note by: Gillis Santa, MD  Service setting: Ambulatory outpatient  Specialty: Interventional Pain Management  Location: [facility] (AMB) Pain Management Facility    Patient type: Established   Primary Reason(s) for Visit: Encounter for prescription drug management. (Level of risk: moderate)  CC: Back Pain (mid)  HPI  Sandy Daniel is a 84 y.o. year old, female patient, who comes today for a medication management evaluation. She has CAD (coronary artery disease); Seizure disorder (Sandy Daniel); Macular degeneration of both eyes; B12 deficiency; COPD (chronic obstructive pulmonary disease) (Sandy Daniel); HTN (hypertension); Hyperlipidemia; Arthritis; Depression; GERD (gastroesophageal reflux disease); Dysphagia; Panic disorder; Controlled substance agreement signed; Physiological tremor; Anxiety; Chronic bilateral low back pain; Unstable gait; (HFpEF) heart failure with preserved ejection fraction (Sandy Daniel); Muscle spasm of back; Chronic bilateral thoracic back pain; Neck pain; Chronic right shoulder pain; Major depressive disorder with single episode, in partial remission (HCC); and Mild memory disturbances not amounting to dementia on their problem list. Her primarily concern today is the Back Pain (mid)  Pain Assessment: Location: Mid Back Radiating: travels up left side of back Onset: More than a month ago Duration: Chronic pain Quality: Aching, Constant Severity: 5 /10 (self-reported pain score)  Note: Reported level is compatible with observation.                         When using our objective Pain Scale, levels between 6 and 10/10 are said to belong in an emergency room, as it progressively worsens from a 6/10, described as severely limiting, requiring emergency care not usually available at an outpatient pain management facility. At a 6/10 level,  communication becomes difficult and requires great effort. Assistance to reach the emergency department may be required. Facial flushing and profuse sweating along with potentially dangerous increases in heart rate and blood pressure will be evident. Effect on ADL: continues to have difficulty getting into comfortable positions, continues to have difficulty swallowing Timing: Constant Modifying factors: medications  Sandy Daniel was last scheduled for an appointment on 05/14/2017 for medication management. During today's appointment we reviewed Sandy Daniel's chronic pain status, as well as her outpatient medication regimen.  Patient returns today for medication follow-up.  She is now living in an assisted living facility.  She finds this much better than her previous living arrangement.  She feels safe here.  Overall she is doing better but is continuing to endorse very severe pain along her left thoracic region that travels superiorly into her left cervical spine and in her occiput.  She states that the hydrocodone at 5 mg is effective but she experiences dose failure at about hour 3/hour 4.  Requesting to increase her hydrocodone.  The patient  reports that she does not use drugs. Her body mass index is 28.71 kg/m.  Further details on both, my assessment(s), as well as the proposed treatment plan, please see below.  Controlled Substance Pharmacotherapy Assessment REMS (Risk Evaluation and Mitigation Strategy)  Analgesic: Hydrocodone 5 mg twice daily as needed, quantity 29-monthMME/day: 10 mg/day.  Sandy Daniel  06/13/2017  2:34 PM  Sign at close encounter Nursing Pain Medication Assessment:  Safety precautions to be maintained throughout the outpatient stay will include: orient to surroundings, keep bed in low position, maintain call bell within reach at all times, provide assistance with transfer out of bed and  ambulation.  Medication Inspection Compliance: Pill count conducted under  aseptic conditions, in front of the patient. Neither the pills nor the bottle was removed from the patient's sight at any time. Once count was completed pills were immediately returned to the patient in their original bottle.  Medication: Hydrocodone/APAP Pill/Patch Count: 0 of 60 pills remain Pill/Patch Appearance: Markings consistent with prescribed medication Bottle Appearance: Standard pharmacy container. Clearly labeled. Filled Date: 4 / 2 / 2019 Last Medication intake:  Ran out of medicine more than 48 hours ago   Pharmacokinetics: Liberation and absorption (onset of action): WNL Distribution (time to peak effect): WNL Metabolism and excretion (duration of action): WNL         Pharmacodynamics: Desired effects: Analgesia: Sandy Daniel reports >50% benefit. Functional ability: Patient reports that medication allows her to accomplish basic ADLs Clinically meaningful improvement in function (CMIF): Sustained CMIF goals met Perceived effectiveness: Described as relatively effective but with some room for improvement Undesirable effects: Side-effects or Adverse reactions: None reported Monitoring: Sandy Daniel: Online review of the past 13-monthperiod conducted. Compliant with practice rules and regulations Last UDS on record: Summary  Date Value Ref Range Status  05/14/2017 FINAL  Final    Comment:    ==================================================================== TOXASSURE COMP DRUG ANALYSIS,UR ==================================================================== Test                             Result       Flag       Units Drug Present and Declared for Prescription Verification   Gabapentin                     PRESENT      EXPECTED   Phenytoin                      PRESENT      EXPECTED   Sertraline                     PRESENT      EXPECTED   Desmethylsertraline            PRESENT      EXPECTED    Desmethylsertraline is an expected metabolite of sertraline. Drug Present not  Declared for Prescription Verification   Naproxen                       PRESENT      UNEXPECTED Drug Absent but Declared for Prescription Verification   Hydrocodone                    Not Detected UNEXPECTED ng/mg creat   Quetiapine                     Not Detected UNEXPECTED   Acetaminophen                  Not Detected UNEXPECTED    Acetaminophen, as indicated in the declared medication list, is    not always detected even when used as directed.   Salicylate                     Not Detected UNEXPECTED    Aspirin, as indicated in the declared medication list, is not    always detected even when used as directed. ==================================================================== Test  Result    Flag   Units      Ref Range   Creatinine              97               mg/dL      >=20 ==================================================================== Declared Medications:  The flagging and interpretation on this report are based on the  following declared medications.  Unexpected results may arise from  inaccuracies in the declared medications.  **Note: The testing scope of this panel includes these medications:  Gabapentin  Hydrocodone (Hydrocodone-Acetaminophen)  Phenytoin  Quetiapine  Sertraline  **Note: The testing scope of this panel does not include small to  moderate amounts of these reported medications:  Acetaminophen (Hydrocodone-Acetaminophen)  Aspirin (Aspirin 81)  **Note: The testing scope of this panel does not include following  reported medications:  Albuterol  Atorvastatin  Buspirone  Carvedilol  Fluticasone (Advair)  Furosemide  Magnesium hydroxide  Nitroglycerin (NTG)  Pantoprazole  Salmeterol (Advair) ==================================================================== For clinical consultation, please call [phone]. ====================================================================    UDS interpretation: Compliant           Medication Assessment Form: Reviewed. Patient indicates being compliant with therapy Treatment compliance: Compliant Risk Assessment Profile: Aberrant behavior: See prior evaluations. None observed or detected today Comorbid factors increasing risk of overdose: See prior notes. No additional risks detected today Risk of substance use disorder (SUD): Low Opioid Risk Tool - 06/13/17 1435      Family History of Substance Abuse   Alcohol  Negative    Illegal Drugs  Negative    Rx Drugs  Negative      Personal History of Substance Abuse   Alcohol  Negative    Illegal Drugs  Negative    Rx Drugs  Negative      Age   Age between 52-45 years   No      Psychological Disease   Psychological Disease  Negative    Depression  Negative      Total Score   Opioid Risk Tool Scoring  0    Opioid Risk Interpretation  Low Risk      ORT Scoring interpretation table:  Score <3 = Low Risk for SUD  Score between 4-7 = Moderate Risk for SUD  Score >8 = High Risk for Opioid Abuse   Risk Mitigation Strategies:  Patient Counseling: Covered Patient-Prescriber Agreement (PPA): Present and active  Notification to other healthcare providers: Done  Pharmacologic Plan: Increase hydrocodone from 5 mg twice daily as needed to 7.5 mg twice daily as needed, quantity 63-month             Laboratory Chemistry  Inflammation Markers (CRP: Acute Phase) (ESR: Chronic Phase) Lab Results  Component Value Date   ESRSEDRATE 35 (H) 06/03/2017                         Rheumatology Markers No results found for: RF, ANA, LABURIC, URICUR, LYMEIGGIGMAB, LYMEABIGMQN                      Renal Function Markers Lab Results  Component Value Date   BUN 23 (H) 06/03/2017   CREATININE 0.83 06/03/2017   GFRAA >60 06/03/2017   GFRNONAA >60 06/03/2017                              Hepatic Function Markers Lab Results  Component Value Date   AST 20 06/03/2017   ALT 13 (L) 06/03/2017   ALBUMIN 3.7 06/03/2017    ALKPHOS 141 (H) 06/03/2017   LIPASE 25 03/08/2016                        Electrolytes Lab Results  Component Value Date   NA 138 06/03/2017   K 4.3 06/03/2017   CL 103 06/03/2017   CALCIUM 8.8 (L) 06/03/2017   MG 2.0 07/11/2016                        Neuropathy Markers Lab Results  Component Value Date   VITAMINB12 309 03/26/2016   FOLATE 4.3 03/26/2016                        Bone Pathology Markers No results found for: [place], [id number], [id number], [id number], 25OHVITD1, 25OHVITD2, 25OHVITD3, TESTOFREE, TESTOSTERONE                       Coagulation Parameters Lab Results  Component Value Date   PLT 264 06/03/2017                        Cardiovascular Markers Lab Results  Component Value Date   BNP 105.0 (H) 07/11/2016   TROPONINI <0.03 07/11/2016   HGB 13.4 06/03/2017   HCT 40.0 06/03/2017                         CA Markers No results found for: CEA, CA125, LABCA2                      Note: Lab results reviewed.  Recent Diagnostic Imaging Results  DG Abdomen 1 View CLINICAL DATA:  Constipation  EXAM: ABDOMEN - 1 VIEW  COMPARISON:  July 14, 2016  FINDINGS: There is diffuse stool throughout much of the colon, particularly involving the left colon and rectum. Rectum is borderline distended with stool. There is no appreciable bowel dilatation or air-fluid level to suggest bowel obstruction. No free air. There are postoperative changes in the lower abdomen and pelvis. Visualized lung bases are clear. There is aortic and iliac artery atherosclerosis.  IMPRESSION: Diffuse stool throughout colon, a finding felt to be indicative of a degree of constipation. No bowel obstruction or free air. There is aortoiliac atherosclerosis.  Aortic Atherosclerosis (ICD10-I70.0).  Electronically Signed   By: Lowella Grip III M.D.   On: 06/07/2017 12:54 DG Chest 2 View Addendum: ADDENDUM REPORT: 06/07/2017 08:56   ADDENDUM:  No evidence of active  mycobacterial infection.   Electronically Signed    By: Titus Dubin M.D.    On: 06/07/2017 08:56 Narrative: CLINICAL DATA:  Back and shoulder pain.  EXAM: CHEST - 2 VIEW  COMPARISON:  Chest x-ray dated Jul 11, 2016.  FINDINGS: Stable cardiomegaly. Normal pulmonary vascularity. Atherosclerotic calcification of the aortic arch. The lungs remain hyperinflated. Streaky linear opacities in the lingula and right lower lobe are favored to reflect atelectasis. No focal consolidation, pleural effusion, or pneumothorax. No acute osseous abnormality. Unchanged compression deformity of T5.  IMPRESSION: COPD with scattered streaky atelectasis/scarring. No active cardiopulmonary disease.  Electronically Signed: By: Titus Dubin M.D. On: 06/03/2017 15:31  Complexity Note: Imaging results reviewed. Results shared with Sandy Daniel, using Layman's terms.  Meds   Current Outpatient Medications:  .  albuterol (PROVENTIL HFA;VENTOLIN HFA) 108 (90 Base) MCG/ACT inhaler, Inhale 2 puffs into the lungs every 6 (six) hours as needed for wheezing or shortness of breath., Disp: 1 Inhaler, Rfl: 1 .  albuterol (PROVENTIL) (2.5 MG/3ML) 0.083% nebulizer solution, Take 3 mLs (2.5 mg total) by nebulization every 6 (six) hours as needed for Wheezing., Disp: 75 mL, Rfl: 1 .  aspirin 81 MG tablet, Take 1 tablet (81 mg total) by mouth daily., Disp: 30 tablet, Rfl: 1 .  atorvastatin (LIPITOR) 10 MG tablet, Take 1 tablet (10 mg total) by mouth daily., Disp: 90 tablet, Rfl: 1 .  busPIRone (BUSPAR) 7.5 MG tablet, Take 1 tablet (7.5 mg total) by mouth 2 (two) times daily., Disp: 60 tablet, Rfl: 1 .  carvedilol (COREG) 3.125 MG tablet, Take 1 tablet (3.125 mg total) by mouth 2 (two) times daily with a meal., Disp: 180 tablet, Rfl: 1 .  Fluticasone-Salmeterol (ADVAIR) 100-50 MCG/DOSE AEPB, Inhale 1 puff into the lungs 2 (two) times daily., Disp: 1 each, Rfl: 1 .  furosemide (LASIX) 20 MG  tablet, Take 1 tablet (20 mg total) by mouth daily. 1/2 tab, Disp: 30 tablet, Rfl: 1 .  gabapentin (NEURONTIN) 300 MG capsule, Take 2 capsules (600 mg total) by mouth 3 (three) times daily as needed (moderate pain)., Disp: 270 capsule, Rfl: 1 .  nitroGLYCERIN (NITROSTAT) 0.4 MG SL tablet, Place 1 tablet (0.4 mg total) under the tongue every 5 (five) minutes as needed for chest pain., Disp: 20 tablet, Rfl: 1 .  pantoprazole (PROTONIX) 40 MG tablet, Take 1 tablet (40 mg total) by mouth daily., Disp: 90 tablet, Rfl: 1 .  phenytoin (DILANTIN) 100 MG ER capsule, Take 2 capsules (200 mg total) by mouth at bedtime. Take 2 by mouth at bedtime, Disp: 30 capsule, Rfl: 1 .  QUEtiapine (SEROQUEL) 50 MG tablet, Take 1 tablet (50 mg total) by mouth at bedtime., Disp: 30 tablet, Rfl: 1 .  sertraline (ZOLOFT) 100 MG tablet, Take 1 tablet (100 mg total) by mouth daily., Disp: 30 tablet, Rfl: 1 .  HYDROcodone-acetaminophen (NORCO) 7.5-325 MG tablet, Take 1 tablet by mouth 2 (two) times daily as needed for moderate pain. For chronic pain. To fill on or after 06-13-2017, 07-13-2017, 08-11-2017, Disp: 60 tablet, Rfl: 0  ROS  Constitutional: Denies any fever or chills Gastrointestinal: No reported hemesis, hematochezia, vomiting, or acute GI distress Musculoskeletal: Denies any acute onset joint swelling, redness, loss of ROM, or weakness Neurological: No reported episodes of acute onset apraxia, aphasia, dysarthria, agnosia, amnesia, paralysis, loss of coordination, or loss of consciousness  Allergies  Sandy Daniel is allergic to motrin [ibuprofen]; tolmetin; alendronate; other; and tramadol hcl.  [place]  Drug: Sandy Daniel  reports that she does not use drugs. Alcohol:  reports that she does not drink alcohol. Tobacco:  reports that she has been smoking cigarettes.  She has a 12.50 pack-year smoking history. She has never used smokeless tobacco. Medical:  has a past medical history of Arthritis, Asthma, B12 deficiency,  CAD (coronary artery disease), COPD (chronic obstructive pulmonary disease) ([place]), Depression, Epilepsy ([place]), GERD (gastroesophageal reflux disease), Hyperlipidemia, Hypertension, Hypoxemia, Macular degeneration (senile) of retina, Macular degeneration of both eyes, MI (myocardial infarction) ([place]) (09/2003, 05/2013), Physiological tremor, Presence of stent of CABG, and Seizures ([place]). Surgical: Sandy Daniel  has a past surgical history that includes Cardiac surgery; Abdominal hysterectomy; Coronary angioplasty with stent (Aug. 2005 and March 2015); Rotator cuff repair (Right); Ventral  hernia repair (2002); Cholecystectomy (1962); Appendectomy; Cardiac catheterization (05/2013); and Cataract extraction (03/2016). Family: family history includes Cancer in her brother and sister; Diabetes in her brother, brother, brother, mother, and sister; Heart attack in her brother and sister; Heart disease in her brother and sister; Hyperlipidemia in her brother and sister; Hypertension in her brother and sister; Kidney failure in her brother.  Constitutional Exam  General appearance: Well nourished, well developed, and well hydrated. In no apparent acute distress Vitals:   06/13/17 1418  BP: 122/65  Pulse: 69  Resp: 16  Temp: 98.4 F (36.9 C)  TempSrc: Oral  SpO2: 96%  Weight: 147 lb (66.7 kg)  Height: 5' (1.524 m)   BMI Assessment: Estimated body mass index is 28.71 kg/m as calculated from the following:   Height as of this encounter: 5' (1.524 m).   Weight as of this encounter: 147 lb (66.7 kg).  BMI interpretation table: BMI level Category Range association with higher incidence of chronic pain  <18 kg/m2 Underweight   18.5-24.9 kg/m2 Ideal body weight   25-29.9 kg/m2 Overweight Increased incidence by 20%  30-34.9 kg/m2 Obese (Class I) Increased incidence by 68%  35-39.9 kg/m2 Severe obesity (Class II) Increased incidence by 136%  >40 kg/m2 Extreme obesity (Class III) Increased incidence by 254%    Patient's current BMI Ideal Body weight  Body mass index is 28.71 kg/m. Ideal body weight: 45.5 kg (100 lb 4.9 oz) Adjusted ideal body weight: 54 kg (118 lb 15.8 oz)   BMI Readings from Last 4 Encounters:  06/13/17 28.71 kg/m  06/12/17 28.71 kg/m  06/03/17 29.10 kg/m  05/14/17 29.10 kg/m   Wt Readings from Last 4 Encounters:  06/13/17 147 lb (66.7 kg)  06/12/17 147 lb (66.7 kg)  06/03/17 149 lb (67.6 kg)  05/14/17 149 lb (67.6 kg)  Psych/Mental status: Alert, oriented x 3 (person, place, & time)       Eyes: PERLA Respiratory: No evidence of acute respiratory distress Cervical Spine Area Exam  Skin & Axial Inspection: Paravertebral muscle atrophy Alignment: Symmetrical Functional ROM: Decreased ROM, bilaterally Stability: No instability detected Muscle Tone/Strength: Functionally intact. No obvious neuro-muscular anomalies detected. Sensory (Neurological): Movement-associated discomfort Palpation: Complains of area being tender to palpation                     Upper Extremity (UE) Exam    Side: Right upper extremity  Side: Left upper extremity   Skin & Extremity Inspection: Skin color, temperature, and hair growth are WNL. No peripheral edema or cyanosis. No masses, redness, swelling, asymmetry, or associated skin lesions. No contractures.  Skin & Extremity Inspection: Skin color, temperature, and hair growth are WNL. No peripheral edema or cyanosis. No masses, redness, swelling, asymmetry, or associated skin lesions. No contractures.   Functional ROM: Unrestricted ROM          Functional ROM: Unrestricted ROM           Muscle Tone/Strength: Functionally intact. No obvious neuro-muscular anomalies detected.  Muscle Tone/Strength: Functionally intact. No obvious neuro-muscular anomalies detected.   Sensory (Neurological): Unimpaired          Sensory (Neurological): Unimpaired           Palpation: No palpable anomalies              Palpation: No palpable anomalies                Specialized Test(s): Deferred  Specialized Test(s): Deferred           Thoracic Spine Area Exam  Skin & Axial Inspection: No masses, redness, or swelling Alignment: Asymmetric Functional ROM: Decreased ROM Stability: No instability detected Muscle Tone/Strength: Functionally intact. No obvious neuro-muscular anomalies detected. Sensory (Neurological): Articular pain pattern Muscle strength & Tone: Complains of area being tender to palpation  Lumbar Spine Area Exam  Skin & Axial Inspection: Thoraco-lumbar Scoliosis Alignment: Asymmetric Functional ROM: Decreased ROM, bilaterally Stability: No instability detected Muscle Tone/Strength: Functionally intact. No obvious neuro-muscular anomalies detected. Sensory (Neurological): Musculoskeletal pain pattern Palpation: Complains of area being tender to palpation       Provocative Tests: Lumbar Hyperextension and rotation test: evaluation deferred today       Lumbar Lateral bending test: evaluation deferred today       Patrick's Maneuver: evaluation deferred today                    Gait & Posture Assessment  Ambulation: Patient came in today in a wheel chair Gait: Very limited, using assistive device to ambulate Posture: Difficulty standing up straight, due to pain   Lower Extremity Exam    Side: Right lower extremity  Side: Left lower extremity  Skin & Extremity Inspection: Skin color, temperature, and hair growth are WNL. No peripheral edema or cyanosis. No masses, redness, swelling, asymmetry, or associated skin lesions. No contractures.  Skin & Extremity Inspection: Skin color, temperature, and hair growth are WNL. No peripheral edema or cyanosis. No masses, redness, swelling, asymmetry, or associated skin lesions. No contractures.  Functional ROM: Unrestricted ROM          Functional ROM: Unrestricted ROM          Muscle Tone/Strength: Functionally intact. No obvious neuro-muscular anomalies detected.   Muscle Tone/Strength: Functionally intact. No obvious neuro-muscular anomalies detected.  Sensory (Neurological): Unimpaired  Sensory (Neurological): Unimpaired  Palpation: No palpable anomalies  Palpation: No palpable anomalies    Assessment  Primary Diagnosis & Pertinent Problem List: The primary encounter diagnosis was Cervical spondylosis without myelopathy. Diagnoses of Disability of walking, Chronic pain syndrome, DDD (degenerative disc disease), cervical, Cervicalgia, Pain of both hip joints, Lumbar degenerative disc disease, and Chronic bilateral low back pain with bilateral sciatica were also pertinent to this visit.  Status Diagnosis  Persistent Persistent Persistent 1. Cervical spondylosis without myelopathy   2. Disability of walking   3. Chronic pain syndrome   4. DDD (degenerative disc disease), cervical   5. Cervicalgia   6. Pain of both hip joints   7. Lumbar degenerative disc disease   8. Chronic bilateral low back pain with bilateral sciatica      General Recommendations: The pain condition that the patient suffers from is best treated with a multidisciplinary approach that involves an increase in physical activity to prevent de-conditioning and worsening of the pain cycle, as well as psychological counseling (formal and/or informal) to address the co-morbid psychological affects of pain. Treatment will often involve judicious use of pain medications and interventional procedures to decrease the pain, allowing the patient to participate in the physical activity that will ultimately produce long-lasting pain reductions. The goal of the multidisciplinary approach is to return the patient to a higher level of overall function and to restore their ability to perform activities of daily living.  84 year old female with a history of chronic pain secondary to cervical spondylosis, cervical degenerative disc disease, lumbar degenerative disc disease, lumbar spondylosis (status  post left C4, 5,  6, 7 cervical facet medial branch nerve blocks which were not very effective) who presents today for medication refill.  Patient states that she continues to have persistent left neck pain that radiates into her posterior scalp region and left occiput. +headaches, secondary to occipital neuralgia and cervical facet disease.    Of note, she is now living in an assisted living facility.  She finds this much better than her previous living arrangement.  She feels safe there.  Overall she is doing better but is continuing to endorse very severe pain along her left thoracic region that travels superiorly into her left cervical spine and in her occiput.  She states that the hydrocodone at 5 mg is effective but she experiences dose failure at about hour 3/hour 4.  Discussed increasing hydrocodone to 7.5 mg twice daily as needed, quantity 63-month  Prescription provided below for 3 months.  Note: Daniel checked and appropriate, urine drug screen up-to-date and appropriate.   Plan of Care  Pharmacotherapy (Medications Ordered): Meds ordered this encounter  Medications  . DISCONTD: HYDROcodone-acetaminophen (NORCO) 7.5-325 MG tablet    Sig: Take 1 tablet by mouth 2 (two) times daily as needed for moderate pain. For chronic pain. To fill on or after 06-13-2017, 07-13-2017, 08-11-2017    Dispense:  60 tablet    Refill:  0    Do not place this medication, or any other prescription from our practice, on "Automatic Refill". Patient may have prescription filled one day early if pharmacy is closed on scheduled refill date.  .Marland KitchenDISCONTD: HYDROcodone-acetaminophen (NORCO) 7.5-325 MG tablet    Sig: Take 1 tablet by mouth 2 (two) times daily as needed for moderate pain. For chronic pain. To fill on or after 06-13-2017, 07-13-2017, 08-11-2017    Dispense:  60 tablet    Refill:  0    Do not place this medication, or any other prescription from our practice, on "Automatic Refill". Patient may have prescription  filled one day early if pharmacy is closed on scheduled refill date.  .Marland KitchenHYDROcodone-acetaminophen (NORCO) 7.5-325 MG tablet    Sig: Take 1 tablet by mouth 2 (two) times daily as needed for moderate pain. For chronic pain. To fill on or after 06-13-2017, 07-13-2017, 08-11-2017    Dispense:  60 tablet    Refill:  0    Do not place this medication, or any other prescription from our practice, on "Automatic Refill". Patient may have prescription filled one day early if pharmacy is closed on scheduled refill date.    Provider-requested follow-up: Return in about 3 months (around 09/13/2017) for Medication Management. Time Note: Greater than 50% of the 25 minute(s) of face-to-face time spent with Sandy Daniel, was spent in counseling/coordination of care regarding: Sandy Daniel's primary cause of pain, medication side effects, the opioid analgesic risks and possible complications, the appropriate use of her medications, realistic expectations, the medication agreement and the patient's responsibilities when it comes to controlled substances.  Future Appointments  Date Time [place] 08/13/2017  9:20 AM KMikey College [facility]  09/12/2017  9:20 AM [facility]    Primary Care Physician: KMikey College NP [facility] Note by: BGillis Santa M.D Date: 06/13/2017; Time: 2:57 PM  There are no Patient Instructions on file for this visit.

## 2017-06-25 ENCOUNTER — Emergency Department
Admission: EM | Admit: 2017-06-25 | Discharge: 2017-06-25 | Disposition: A | Discharge status: Home / Self Care | Payer: Medicare HMO | Attending: Emergency Medicine | Admitting: Emergency Medicine

## 2017-06-25 ENCOUNTER — Encounter: Payer: Self-pay | Admitting: Emergency Medicine

## 2017-06-25 ENCOUNTER — Emergency Department: Payer: Medicare HMO

## 2017-06-25 ENCOUNTER — Other Ambulatory Visit: Payer: Self-pay

## 2017-06-25 DIAGNOSIS — Z951 Presence of aortocoronary bypass graft: Secondary | ICD-10-CM | POA: Diagnosis not present

## 2017-06-25 DIAGNOSIS — J45909 Unspecified asthma, uncomplicated: Secondary | ICD-10-CM | POA: Insufficient documentation

## 2017-06-25 DIAGNOSIS — I10 Essential (primary) hypertension: Secondary | ICD-10-CM | POA: Diagnosis not present

## 2017-06-25 DIAGNOSIS — Y9389 Activity, other specified: Secondary | ICD-10-CM | POA: Diagnosis not present

## 2017-06-25 DIAGNOSIS — F17210 Nicotine dependence, cigarettes, uncomplicated: Secondary | ICD-10-CM | POA: Diagnosis not present

## 2017-06-25 DIAGNOSIS — W19XXXA Unspecified fall, initial encounter: Secondary | ICD-10-CM

## 2017-06-25 DIAGNOSIS — Z79899 Other long term (current) drug therapy: Secondary | ICD-10-CM | POA: Diagnosis not present

## 2017-06-25 DIAGNOSIS — Z955 Presence of coronary angioplasty implant and graft: Secondary | ICD-10-CM | POA: Insufficient documentation

## 2017-06-25 DIAGNOSIS — M47896 Other spondylosis, lumbar region: Secondary | ICD-10-CM

## 2017-06-25 DIAGNOSIS — M545 Low back pain: Secondary | ICD-10-CM | POA: Diagnosis present

## 2017-06-25 DIAGNOSIS — Y999 Unspecified external cause status: Secondary | ICD-10-CM | POA: Insufficient documentation

## 2017-06-25 DIAGNOSIS — Y929 Unspecified place or not applicable: Secondary | ICD-10-CM | POA: Diagnosis not present

## 2017-06-25 DIAGNOSIS — I2510 Atherosclerotic heart disease of native coronary artery without angina pectoris: Secondary | ICD-10-CM | POA: Diagnosis not present

## 2017-06-25 DIAGNOSIS — Z7982 Long term (current) use of aspirin: Secondary | ICD-10-CM | POA: Insufficient documentation

## 2017-06-25 DIAGNOSIS — Y92009 Unspecified place in unspecified non-institutional (private) residence as the place of occurrence of the external cause: Secondary | ICD-10-CM

## 2017-06-25 DIAGNOSIS — W010XXA Fall on same level from slipping, tripping and stumbling without subsequent striking against object, initial encounter: Secondary | ICD-10-CM | POA: Diagnosis not present

## 2017-06-25 MED ORDER — OXYCODONE-ACETAMINOPHEN 5-325 MG PO TABS
1.0000 | ORAL_TABLET | Freq: Once | ORAL | Status: AC
  Administered 2017-06-25: 1 via ORAL
  Filled 2017-06-25: qty 1

## 2017-06-25 MED ORDER — LIDOCAINE HCL (PF) 1 % IJ SOLN
INTRAMUSCULAR | Status: AC
  Filled 2017-06-25: qty 5

## 2017-06-25 NOTE — Discharge Instructions (Addendum)
No acute findings except for severe arthritis on your lumbar spine x-ray.  Continue taking oxycodone as directed.  Follow-up with PCP.

## 2017-06-25 NOTE — ED Provider Notes (Signed)
[facility] Emergency Department Provider Note   ____________________________________________   First MD Initiated Contact with Patient 06/25/17 1333     (approximate)  I have reviewed the triage vital signs and the nursing notes.   HISTORY  Chief Complaint Fall and Back Pain    HPI Sandy Daniel is a 84 y.o. female patient complain low back pain secondary to slip and fall.  Patient state she awoke this early this morning to go to the bathroom loss of balance and fell.  Patient states she did hit the back of her head but denies LOC or vision disturbance.  Patient denies headache.  States that her chief complaint is low back pain.  Patient denies bladder bowel dysfunction.  Patient states she was able to void even after the fall.  Patient state transient relief taking Percocet after the incident.  Past Medical History:  Diagnosis Date  . Arthritis   . Asthma   . B12 deficiency   . CAD (coronary artery disease)   . COPD (chronic obstructive pulmonary disease) (HCC)   . Depression   . Epilepsy (HCC)   . GERD (gastroesophageal reflux disease)   . Hyperlipidemia   . Hypertension   . Hypoxemia   . Macular degeneration (senile) of retina   . Macular degeneration of both eyes   . MI (myocardial infarction) (HCC) 09/2003, 05/2013  . Physiological tremor   . Presence of stent of CABG    x2   . Seizures [facility])     Patient Active Problem List   Diagnosis Date Noted  . 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 Major depressive disorder with single episode, in partial remission (HCC) 05/15/2017  . Neck pain 01/31/2017  . Chronic right shoulder pain 01/31/2017  . Chronic bilateral thoracic back pain 12/04/2016  . Muscle spasm of back 10/22/2016  . (HFpEF) heart failure with preserved ejection fraction (HCC) 09/11/2016  . Anxiety 07/16/2016  . Chronic bilateral low back pain 07/16/2016  . Unstable gait 07/16/2016  . Controlled  substance agreement signed 03/27/2016  . Dysphagia 03/26/2016  . Panic disorder 03/26/2016  . CAD (coronary artery disease)   . Seizure disorder (HCC)   . Macular degeneration of both eyes   . B12 deficiency   . COPD (chronic obstructive pulmonary disease) (HCC)   . HTN (hypertension)   . Hyperlipidemia   . Arthritis   . Depression   . GERD (gastroesophageal reflux disease)   . Physiological tremor 02/18/2014    Past Surgical History:  Procedure Laterality Date  . ABDOMINAL HYSTERECTOMY    . APPENDECTOMY    . CARDIAC CATHETERIZATION  05/2013  . CARDIAC SURGERY    . CATARACT EXTRACTION  03/2016  . CHOLECYSTECTOMY  1962  . CORONARY ANGIOPLASTY WITH STENT PLACEMENT  Aug. 2005 and March 2015  . ROTATOR CUFF REPAIR Right   . VENTRAL HERNIA REPAIR  2002    Prior to Admission medications   Medication Sig Start Date End Date Taking? Authorizing Provider  albuterol (PROVENTIL HFA;VENTOLIN HFA) 108 (90 Base) MCG/ACT inhaler Inhale 2 puffs into the lungs every 6 (six) hours as needed for wheezing or shortness of breath. 06/07/17   Dionne Bucy, MD  albuterol (PROVENTIL) (2.5 MG/3ML) 0.083% nebulizer solution Take 3 mLs (2.5 mg total) by nebulization every 6 (six) hours as needed for Wheezing. 06/07/17   Dionne Bucy, MD  aspirin 81 MG tablet Take 1 tablet (81 mg total) by mouth daily. 06/07/17   Siadecki,  Wilmon Pali, MD  atorvastatin (LIPITOR) 10 MG tablet Take 1 tablet (10 mg total) by mouth daily. 06/12/17   Galen Manila, NP  busPIRone (BUSPAR) 7.5 MG tablet Take 1 tablet (7.5 mg total) by mouth 2 (two) times daily. 06/07/17   Dionne Bucy, MD  carvedilol (COREG) 3.125 MG tablet Take 1 tablet (3.125 mg total) by mouth 2 (two) times daily with a meal. 06/07/17   Dionne Bucy, MD  Fluticasone-Salmeterol (ADVAIR) 100-50 MCG/DOSE AEPB Inhale 1 puff into the lungs 2 (two) times daily. 06/07/17   Dionne Bucy, MD  furosemide (LASIX) 20 MG tablet Take 1 tablet  (20 mg total) by mouth daily. 1/2 tab 06/07/17   Dionne Bucy, MD  gabapentin (NEURONTIN) 300 MG capsule Take 2 capsules (600 mg total) by mouth 3 (three) times daily as needed (moderate pain). 06/07/17   Dionne Bucy, MD  HYDROcodone-acetaminophen (NORCO) 7.5-325 MG tablet Take 1 tablet by mouth 2 (two) times daily as needed for moderate pain. For chronic pain. To fill on or after 06-13-2017, 07-13-2017, 08-11-2017 06/13/17   Edward Jolly, MD  nitroGLYCERIN (NITROSTAT) 0.4 MG SL tablet Place 1 tablet (0.4 mg total) under the tongue every 5 (five) minutes as needed for chest pain. 06/12/17   Galen Manila, NP  pantoprazole (PROTONIX) 40 MG tablet Take 1 tablet (40 mg total) by mouth daily. 06/07/17   Dionne Bucy, MD  phenytoin (DILANTIN) 100 MG ER capsule Take 2 capsules (200 mg total) by mouth at bedtime. Take 2 by mouth at bedtime 06/07/17   Dionne Bucy, MD  QUEtiapine (SEROQUEL) 50 MG tablet Take 1 tablet (50 mg total) by mouth at bedtime. 06/07/17 08/06/17  Dionne Bucy, MD  sertraline (ZOLOFT) 100 MG tablet Take 1 tablet (100 mg total) by mouth daily. 06/07/17 08/06/17  Dionne Bucy, MD    Allergies Motrin [ibuprofen]; Tolmetin; Alendronate; Other; and Tramadol hcl  Family History  Problem Relation Age of Onset  . Diabetes Mother   . Cancer Sister   . Diabetes Sister   . Heart disease Sister   . Heart attack Sister   . Hyperlipidemia Sister   . Hypertension Sister   . Cancer Brother   . Diabetes Brother   . Heart disease Brother   . Heart attack Brother   . Hyperlipidemia Brother   . Hypertension Brother   . Diabetes Brother   . Diabetes Brother   . Kidney failure Brother     Social History Social History   Tobacco Use  . Smoking status: Current Every Day Smoker    Packs/day: 0.25    Years: 50.00    Pack years: 12.50    Types: Cigarettes  . Smokeless tobacco: Never Used  . Tobacco comment: 10 a day   Substance Use Topics  .  Alcohol use: No  . Drug use: No    Review of Systems Constitutional: No fever/chills Eyes: No visual changes. ENT: No sore throat. Cardiovascular: Denies chest pain. Respiratory: Denies shortness of breath. Gastrointestinal: No abdominal pain.  No nausea, no vomiting.  No diarrhea.  No constipation. Genitourinary: Negative for dysuria. Musculoskeletal: Negative for back pain. Skin: Negative for rash. Neurological: Negative for headaches, focal weakness or numbness. Psychiatric:Depression Endocrine:Hyperlipidemia and hypertension Allergic/Immunilogical: See medication list. ____________________________________________   PHYSICAL EXAM:  VITAL SIGNS: ED Triage Vitals  Enc Vitals Group     BP 06/25/17 1305 (!) 120/56     Pulse Rate 06/25/17 1305 (!) 58     Resp 06/25/17 1305 16  Temp 06/25/17 1305 97.9 F (36.6 C)     Temp Source 06/25/17 1305 Oral     SpO2 06/25/17 1305 94 %     Weight 06/25/17 1306 147 lb (66.7 kg)     Height 06/25/17 1306 5' (1.524 m)     Head Circumference --      Peak Flow --      Pain Score 06/25/17 1306 10     Pain Loc --      Pain Edu? --      Excl. in GC? --     Constitutional: Alert and oriented. Well appearing and in no acute distress. Eyes: Conjunctivae are normal. PERRL. EOMI. Head: Atraumatic. Neck: No stridor.  No cervical spine tenderness to palpation. Hematological/Lymphatic/Immunilogical: No cervical lymphadenopathy. Cardiovascular: Bradycardia, regular rhythm. Grossly normal heart sounds.  Good peripheral circulation. Respiratory: Normal respiratory effort.  No retractions. Lungs CTAB. Gastrointestinal: Soft and nontender. No distention. No abdominal bruits. No CVA tenderness. Genitourinary: Deferred Musculoskeletal: No lower extremity tenderness nor edema.  No joint effusions. Neurologic:  Normal speech and language. No gross focal neurologic deficits are appreciated. No gait instability. Skin:  Skin is warm, dry and intact.  No rash noted. Psychiatric: Mood and affect are normal. Speech and behavior are normal.  ____________________________________________   LABS (all labs ordered are listed, but only abnormal results are displayed)  Labs Reviewed - No data to display ____________________________________________  EKG   ____________________________________________  RADIOLOGY    Official radiology report(s): Dg Lumbar Spine Complete  Result Date: 06/25/2017 CLINICAL DATA:  Recent fall with low back pain, initial encounter EXAM: LUMBAR SPINE - COMPLETE 4+ VIEW COMPARISON:  06/03/2017 FINDINGS: Five lumbar type vertebral bodies are well visualized. Mild scoliosis concave to the right is again noted and stable. Mild facet hypertrophic changes and osteophytic changes are seen. No pars defects are noted. No anterolisthesis is seen. Multilevel disc space narrowing is noted with associated osteophytes. No soft tissue abnormality is seen. IMPRESSION: Chronic degenerative change and scoliosis without acute abnormality. Electronically Signed   By: Alcide Clever M.D.   On: 06/25/2017 14:16    ____________________________________________   PROCEDURES  Procedure(s) performed: None  Procedures  Critical Care performed: No  ____________________________________________   INITIAL IMPRESSION / ASSESSMENT AND PLAN / ED COURSE  As part of my medical decision making, I reviewed the following data within the electronic MEDICAL RECORD NUMBER    Back pain secondary to fall.  Discussed x-ray findings with patient.  Patient given discharge care instruction advised continue previous medications.      ____________________________________________   FINAL CLINICAL IMPRESSION(S) / ED DIAGNOSES  Final diagnoses:  Fall in home, initial encounter  Other osteoarthritis of spine, lumbar region     ED Discharge Orders    None       Note:  This document was prepared using Dragon voice recognition software and may  include unintentional dictation errors.    Joni Reining, PA-C 06/25/17 1501    Emily Filbert, MD 06/25/17 [phone]

## 2017-06-25 NOTE — ED Notes (Signed)
See triage note  States she tripped  Having mid back pain

## 2017-06-25 NOTE — ED Triage Notes (Signed)
Says she fell this am when she got up to go to bathroom.  Says she injured her back and hit her head on the grab bar.  Denies loc.  Her back hit the wastebasket.

## 2017-06-25 NOTE — ED Notes (Signed)
First nurse note: from group home, mid back pain per EMS , tripped and fell. NAD.

## 2017-07-12 ENCOUNTER — Emergency Department: Payer: Medicare HMO

## 2017-07-12 ENCOUNTER — Emergency Department
Admission: EM | Admit: 2017-07-12 | Discharge: 2017-07-12 | Disposition: A | Discharge status: Home / Self Care | Payer: Medicare HMO | Attending: Student in an Organized Health Care Education/Training Program | Admitting: Student in an Organized Health Care Education/Training Program

## 2017-07-12 ENCOUNTER — Other Ambulatory Visit: Payer: Self-pay

## 2017-07-12 DIAGNOSIS — J449 Chronic obstructive pulmonary disease, unspecified: Secondary | ICD-10-CM | POA: Insufficient documentation

## 2017-07-12 DIAGNOSIS — Z7982 Long term (current) use of aspirin: Secondary | ICD-10-CM | POA: Diagnosis not present

## 2017-07-12 DIAGNOSIS — I252 Old myocardial infarction: Secondary | ICD-10-CM | POA: Insufficient documentation

## 2017-07-12 DIAGNOSIS — I110 Hypertensive heart disease with heart failure: Secondary | ICD-10-CM | POA: Insufficient documentation

## 2017-07-12 DIAGNOSIS — M19041 Primary osteoarthritis, right hand: Secondary | ICD-10-CM | POA: Diagnosis not present

## 2017-07-12 DIAGNOSIS — I5030 Unspecified diastolic (congestive) heart failure: Secondary | ICD-10-CM | POA: Insufficient documentation

## 2017-07-12 DIAGNOSIS — I2510 Atherosclerotic heart disease of native coronary artery without angina pectoris: Secondary | ICD-10-CM | POA: Diagnosis not present

## 2017-07-12 DIAGNOSIS — S6991XA Unspecified injury of right wrist, hand and finger(s), initial encounter: Secondary | ICD-10-CM | POA: Insufficient documentation

## 2017-07-12 DIAGNOSIS — F0390 Unspecified dementia without behavioral disturbance: Secondary | ICD-10-CM | POA: Diagnosis not present

## 2017-07-12 DIAGNOSIS — Y939 Activity, unspecified: Secondary | ICD-10-CM | POA: Insufficient documentation

## 2017-07-12 DIAGNOSIS — F17210 Nicotine dependence, cigarettes, uncomplicated: Secondary | ICD-10-CM | POA: Insufficient documentation

## 2017-07-12 DIAGNOSIS — Y929 Unspecified place or not applicable: Secondary | ICD-10-CM | POA: Diagnosis not present

## 2017-07-12 DIAGNOSIS — Z79899 Other long term (current) drug therapy: Secondary | ICD-10-CM | POA: Diagnosis not present

## 2017-07-12 DIAGNOSIS — Y999 Unspecified external cause status: Secondary | ICD-10-CM | POA: Diagnosis not present

## 2017-07-12 DIAGNOSIS — W010XXA Fall on same level from slipping, tripping and stumbling without subsequent striking against object, initial encounter: Secondary | ICD-10-CM | POA: Diagnosis not present

## 2017-07-12 MED ORDER — LIDOCAINE 0.5 % EX GEL: 1.0000 "application " | Freq: Three times a day (TID) | CUTANEOUS | 0 refills | Status: DC

## 2017-07-12 NOTE — ED Notes (Signed)
Pt reports she fell several days ago on her right hand. Swelling and pain in hand and forearm.

## 2017-07-12 NOTE — ED Provider Notes (Signed)
[facility] Emergency Department Provider Note  ____________________________________________  Time seen: Approximately 9:13 PM  I have reviewed the triage vital signs and the nursing notes.   HISTORY  Chief Complaint Hand Injury    HPI Sandy Daniel is a 84 y.o. female that presents emergency department for evaluation of right hand pain after falling 3 days ago.  Patient hit her hand on the floor when she stumbled while going to the bathroom.  She landed on the floor but did not hit her head.  She denies loss of consciousness.  She lives at a nursing home.  She denies any additional injuries.  She has been icing hand, without relief.  No headache, visual changes, nausea, vomiting, abdominal pain.   Past Medical History:  Diagnosis Date  . Arthritis   . Asthma   . B12 deficiency   . CAD (coronary artery disease)   . COPD (chronic obstructive pulmonary disease) (HCC)   . Depression   . Epilepsy (HCC)   . GERD (gastroesophageal reflux disease)   . Hyperlipidemia   . Hypertension   . Hypoxemia   . Macular degeneration (senile) of retina   . Macular degeneration of both eyes   . MI (myocardial infarction) (HCC) 09/2003, 05/2013  . Physiological tremor   . Presence of stent of CABG    x2   . Seizures [facility])     Patient Active Problem List   Diagnosis Date Noted  . 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 Major depressive disorder with single episode, in partial remission (HCC) 05/15/2017  . Neck pain 01/31/2017  . Chronic right shoulder pain 01/31/2017  . Chronic bilateral thoracic back pain 12/04/2016  . Muscle spasm of back 10/22/2016  . (HFpEF) heart failure with preserved ejection fraction (HCC) 09/11/2016  . Anxiety 07/16/2016  . Chronic bilateral low back pain 07/16/2016  . Unstable gait 07/16/2016  . Controlled substance agreement signed 03/27/2016  . Dysphagia 03/26/2016  . Panic disorder 03/26/2016  . CAD  (coronary artery disease)   . Seizure disorder (HCC)   . Macular degeneration of both eyes   . B12 deficiency   . COPD (chronic obstructive pulmonary disease) (HCC)   . HTN (hypertension)   . Hyperlipidemia   . Arthritis   . Depression   . GERD (gastroesophageal reflux disease)   . Physiological tremor 02/18/2014    Past Surgical History:  Procedure Laterality Date  . ABDOMINAL HYSTERECTOMY    . APPENDECTOMY    . CARDIAC CATHETERIZATION  05/2013  . CARDIAC SURGERY    . CATARACT EXTRACTION  03/2016  . CHOLECYSTECTOMY  1962  . CORONARY ANGIOPLASTY WITH STENT PLACEMENT  Aug. 2005 and March 2015  . ROTATOR CUFF REPAIR Right   . VENTRAL HERNIA REPAIR  2002    Prior to Admission medications   Medication Sig Start Date End Date Taking? Authorizing Provider  albuterol (PROVENTIL HFA;VENTOLIN HFA) 108 (90 Base) MCG/ACT inhaler Inhale 2 puffs into the lungs every 6 (six) hours as needed for wheezing or shortness of breath. 06/07/17   Dionne Bucy, MD  albuterol (PROVENTIL) (2.5 MG/3ML) 0.083% nebulizer solution Take 3 mLs (2.5 mg total) by nebulization every 6 (six) hours as needed for Wheezing. 06/07/17   Dionne Bucy, MD  aspirin 81 MG tablet Take 1 tablet (81 mg total) by mouth daily. 06/07/17   Dionne Bucy, MD  atorvastatin (LIPITOR) 10 MG tablet Take 1 tablet (10 mg total) by mouth daily. 06/12/17   Kyung Rudd,  Alison Stalling, NP  busPIRone (BUSPAR) 7.5 MG tablet Take 1 tablet (7.5 mg total) by mouth 2 (two) times daily. 06/07/17   Dionne Bucy, MD  carvedilol (COREG) 3.125 MG tablet Take 1 tablet (3.125 mg total) by mouth 2 (two) times daily with a meal. 06/07/17   Dionne Bucy, MD  Fluticasone-Salmeterol (ADVAIR) 100-50 MCG/DOSE AEPB Inhale 1 puff into the lungs 2 (two) times daily. 06/07/17   Dionne Bucy, MD  furosemide (LASIX) 20 MG tablet Take 1 tablet (20 mg total) by mouth daily. 1/2 tab 06/07/17   Dionne Bucy, MD  gabapentin (NEURONTIN) 300  MG capsule Take 2 capsules (600 mg total) by mouth 3 (three) times daily as needed (moderate pain). 06/07/17   Dionne Bucy, MD  HYDROcodone-acetaminophen (NORCO) 7.5-325 MG tablet Take 1 tablet by mouth 2 (two) times daily as needed for moderate pain. For chronic pain. To fill on or after 06-13-2017, 07-13-2017, 08-11-2017 06/13/17   Edward Jolly, MD  Lidocaine 0.5 % GEL Apply 1 application topically 3 (three) times daily. 07/12/17   Enid Derry, PA-C  nitroGLYCERIN (NITROSTAT) 0.4 MG SL tablet Place 1 tablet (0.4 mg total) under the tongue every 5 (five) minutes as needed for chest pain. 06/12/17   Galen Manila, NP  pantoprazole (PROTONIX) 40 MG tablet Take 1 tablet (40 mg total) by mouth daily. 06/07/17   Dionne Bucy, MD  phenytoin (DILANTIN) 100 MG ER capsule Take 2 capsules (200 mg total) by mouth at bedtime. Take 2 by mouth at bedtime 06/07/17   Dionne Bucy, MD  QUEtiapine (SEROQUEL) 50 MG tablet Take 1 tablet (50 mg total) by mouth at bedtime. 06/07/17 08/06/17  Dionne Bucy, MD  sertraline (ZOLOFT) 100 MG tablet Take 1 tablet (100 mg total) by mouth daily. 06/07/17 08/06/17  Dionne Bucy, MD    Allergies Motrin [ibuprofen]; Tolmetin; Alendronate; Other; and Tramadol hcl  Family History  Problem Relation Age of Onset  . Diabetes Mother   . Cancer Sister   . Diabetes Sister   . Heart disease Sister   . Heart attack Sister   . Hyperlipidemia Sister   . Hypertension Sister   . Cancer Brother   . Diabetes Brother   . Heart disease Brother   . Heart attack Brother   . Hyperlipidemia Brother   . Hypertension Brother   . Diabetes Brother   . Diabetes Brother   . Kidney failure Brother     Social History Social History   Tobacco Use  . Smoking status: Current Every Day Smoker    Packs/day: 0.25    Years: 50.00    Pack years: 12.50    Types: Cigarettes  . Smokeless tobacco: Never Used  . Tobacco comment: 10 a day   Substance Use Topics  .  Alcohol use: No  . Drug use: No     Review of Systems  Respiratory: No SOB. Gastrointestinal: No abdominal pain.  No nausea, no vomiting.   Musculoskeletal: Positive for hand pain. Skin: Negative for rash, abrasions, lacerations, ecchymosis. Neurological: Negative for headaches, numbness or tingling   ____________________________________________   PHYSICAL EXAM:  VITAL SIGNS: ED Triage Vitals  Enc Vitals Group     BP 07/12/17 1942 102/60     Pulse Rate 07/12/17 1942 62     Resp 07/12/17 1942 16     Temp 07/12/17 1942 98.2 F (36.8 C)     Temp Source 07/12/17 1942 Oral     SpO2 07/12/17 1942 94 %  Weight 07/12/17 1943 143 lb (64.9 kg)     Height 07/12/17 1943 5' (1.524 m)     Head Circumference --      Peak Flow --      Pain Score 07/12/17 1943 7     Pain Loc --      Pain Edu? --      Excl. in GC? --      Constitutional: Alert and oriented. Well appearing and in no acute distress. Eyes: Conjunctivae are normal. PERRL. EOMI. Head: Atraumatic. ENT:      Ears:      Nose: No congestion/rhinnorhea.      Mouth/Throat: Mucous membranes are moist.  Neck: No stridor.  No cervical spine tenderness to palpation. Cardiovascular: Normal rate, regular rhythm.  Good peripheral circulation.  Symmetric radial pulses bilaterally. Respiratory: Normal respiratory effort without tachypnea or retractions. Lungs CTAB. Good air entry to the bases with no decreased or absent breath sounds. Musculoskeletal: Full range of motion to all extremities. No gross deformities appreciated.  Moderate swelling and tenderness to palpation over dorsal right hand.  No tenderness to palpation of her forearm.  Full range of motion of the hand. Neurologic:  Normal speech and language. No gross focal neurologic deficits are appreciated.  Skin:  Skin is warm, dry and intact. No rash noted. Psychiatric: Mood and affect are normal. Speech and behavior are normal. Patient exhibits appropriate insight and  judgement.   ____________________________________________   LABS (all labs ordered are listed, but only abnormal results are displayed)  Labs Reviewed - No data to display ____________________________________________  EKG   ____________________________________________  RADIOLOGY Lexine Baton, personally viewed and evaluated these images (plain radiographs) as part of my medical decision making, as well as reviewing the written report by the radiologist.  Dg Wrist Complete Right  Result Date: 07/12/2017 CLINICAL DATA:  Right wrist pain for 2 days since a fall when the patient got up to go to the restroom. Initial encounter. EXAM: RIGHT WRIST - COMPLETE 3+ VIEW COMPARISON:  None. FINDINGS: No acute bony or joint abnormality is identified. First CMC osteoarthritis and chondrocalcinosis are noted. Soft tissues appear swollen. IMPRESSION: Soft tissue swelling without acute bony abnormality. First CMC osteoarthritis. Chondrocalcinosis. Electronically Signed   By: Drusilla Kanner M.D.   On: 07/12/2017 20:22   Dg Hand Complete Right  Result Date: 07/12/2017 CLINICAL DATA:  Right hand pain since a fall going to the bathroom 2 days ago. Initial encounter. EXAM: RIGHT HAND - COMPLETE 3+ VIEW COMPARISON:  None. FINDINGS: No acute bony or joint abnormality is identified. The patient has severe osteoarthritis about the IP joint of the thumb and multiple PIP and DIP joints of the fingers. Somewhat milder degree of first CMC osteoarthritis is noted. Degenerative disease is also present about the MCP joints. Chondrocalcinosis is noted. Soft tissues appear swollen. IMPRESSION: Soft tissue swelling without underlying acute bony or joint abnormality. Advanced multifocal osteoarthritis. Electronically Signed   By: Drusilla Kanner M.D.   On: 07/12/2017 20:21    ____________________________________________    PROCEDURES  Procedure(s) performed:    Procedures    Medications - No data to  display   ____________________________________________   INITIAL IMPRESSION / ASSESSMENT AND PLAN / ED COURSE  Pertinent labs & imaging results that were available during my care of the patient were reviewed by me and considered in my medical decision making (see chart for details).  Review of the [place] CSRS was performed in accordance of the [facility] prior to  dispensing any controlled drugs.     Patient presents emergency department for evaluation of hand injury.  Vital signs and exam are reassuring.  X-ray negative for fracture. Hand was wrapped. Patient was educated to ice hand.  She will be given a lidocaine gel.  She is unable to take anti-inflammatories.  Patient will be discharged home with prescriptions for lidocaine gel. Patient is to follow up with PCP as directed. Patient is given ED precautions to return to the ED for any worsening or new symptoms.     ____________________________________________  FINAL CLINICAL IMPRESSION(S) / ED DIAGNOSES  Final diagnoses:  Injury of right hand, initial encounter  Osteoarthritis of right hand, unspecified osteoarthritis type      NEW MEDICATIONS STARTED DURING THIS VISIT:  ED Discharge Orders        Ordered    Lidocaine 0.5 % GEL  3 times daily     07/12/17 2235          This chart was dictated using voice recognition software/Daniel. Despite best efforts to proofread, errors can occur which can change the meaning. Any change was purely unintentional.    Enid Derry, PA-C 07/13/17 0006    Willy Eddy, MD 07/13/17 [phone]

## 2017-07-12 NOTE — ED Notes (Signed)
Patient discharged to room 52, who is her caregiver.

## 2017-07-12 NOTE — ED Triage Notes (Signed)
Pt states she fell getting up to bathroom 2 days ago. Pt states she hit her right hand on a chest of drawers while falling. Right hand is swollen, cms intact to fingers. Pt also complains of right wrist pain.

## 2017-07-30 ENCOUNTER — Ambulatory Visit (INDEPENDENT_AMBULATORY_CARE_PROVIDER_SITE_OTHER): Payer: Medicare HMO | Admitting: Family Medicine

## 2017-07-30 ENCOUNTER — Encounter: Payer: Self-pay | Admitting: Family Medicine

## 2017-07-30 ENCOUNTER — Other Ambulatory Visit: Payer: Self-pay

## 2017-07-30 VITALS — BP 142/54 | HR 53 | Temp 97.8°F | Ht 60.0 in | Wt 144.6 lb

## 2017-07-30 DIAGNOSIS — Z9889 Other specified postprocedural states: Secondary | ICD-10-CM | POA: Diagnosis not present

## 2017-07-30 DIAGNOSIS — R531 Weakness: Secondary | ICD-10-CM

## 2017-07-30 DIAGNOSIS — R5381 Other malaise: Secondary | ICD-10-CM | POA: Diagnosis not present

## 2017-07-30 DIAGNOSIS — R1900 Intra-abdominal and pelvic swelling, mass and lump, unspecified site: Secondary | ICD-10-CM | POA: Diagnosis not present

## 2017-07-30 DIAGNOSIS — R1011 Right upper quadrant pain: Secondary | ICD-10-CM

## 2017-07-30 DIAGNOSIS — Z8719 Personal history of other diseases of the digestive system: Secondary | ICD-10-CM

## 2017-07-30 NOTE — Patient Instructions (Addendum)
Thank you for coming to the office today.  For swelling and pain in right upper abdomen - it seems related to mesh and prior hernia - I do not think you have a new hernia, but as long as it does not come back or get worse or more pain then it should be no problem and I do not think other intervention would be necessary.  If the hernia or swelling comes back and does not go back at all then you would need to contact office come in or go to hospital ED if it was very painful or causing other symptoms nausea vomiting  May consider an Ultrasound abdomen image in future if needed  For generalized weakness - it sounds more like deconditioning from muscle fatigue and arthritis and pain - recommend using walker more and working on staying active to keep improving muscle strength, we will try to arrange physical therapy as best we can in future.  Please schedule a Follow-up Appointment to: Return in about 2 weeks (around 08/13/2017) for follow-up.  If you have any other questions or concerns, please feel free to call the office or send a message through MyChart. You may also schedule an earlier appointment if necessary.  Additionally, you may be receiving a survey about your experience at our office within a few days to 1 week by e-mail or mail. We value your feedback.  Saralyn Pilar, DO [facility], [place]

## 2017-07-30 NOTE — Progress Notes (Signed)
Subjective:    Patient ID: Sandy Daniel, female    DOB: 03-19-1928, 84 y.o.   MRN: [id number]  Sandy Daniel is a 84 y.o. female presenting on 07/30/2017 for Abdominal Pain (pt states she woke up with a knot on the right side of her abdomen x 3 days. Mild discomfort, but mostly concern about the swelling  ) and Fatigue (several days)  Patient presents for a same day appointment. Patient is normally followed by PCP [name], AGPCNP-BC  HPI   RIGHT UPPER ABDOMEN SWELLING / DISCOMFORT New problem, acute onset 3 days ago felt an area of swelling / swollen knot or nodule in R upper abdomen, without pain but did endorse uncomfortable sensation with pressure on this area if wearing tight pants or clothing putting pressure on it. Now over past 3 days significant improvement and swelling has resolved - She has extensive abdominal hernia repair x 5 by her report in past, with mesh in place, among other surgical interventions, none done recently - Denies dysuria, hematuria, urinary frequency, back or flank pain, active abdominal pain, constipation, dark stool blood in stool, nausea vomiting, fever chills sweating  Fatigue / Generalized Weakness Reports symptoms worsening over past few weeks with reduced energy and fatigue, able to walk some distance with cane - She is currently living at senior citizen home by her report, he has had HH PT in past but does not currently endorse this service, no longer on PT states she cannot afford this. She has a walker and using this occasionally. She has had prior falls. Currently using cane in office. She has had HH PT in past but does not currently endorse this service. - Some difficulty with weakness in muscles lifting up from chair but does better once "gets going"  Past Surgical History:  Procedure Laterality Date  . ABDOMINAL HYSTERECTOMY    . APPENDECTOMY    . CARDIAC CATHETERIZATION  05/2013  . CARDIAC SURGERY    . CATARACT EXTRACTION   03/2016  . CHOLECYSTECTOMY  1962  . CORONARY ANGIOPLASTY WITH STENT PLACEMENT  Aug. 2005 and March 2015  . ROTATOR CUFF REPAIR Right   . VENTRAL HERNIA REPAIR  2002     Depression screen [facility] 2/9 06/13/2017 05/14/2017 04/11/2017  Decreased Interest 0 0 0  Down, Depressed, Hopeless 0 0 0  PHQ - 2 Score 0 0 0  Altered sleeping - - -  Tired, decreased energy - - -  Change in appetite - - -  Feeling bad or failure about yourself  - - -  Trouble concentrating - - -  Moving slowly or fidgety/restless - - -  Suicidal thoughts - - -  PHQ-9 Score - - -  Difficult doing work/chores - - -    Social History   Tobacco Use  . Smoking status: Current Every Day Smoker    Packs/day: 0.25    Years: 50.00    Pack years: 12.50    Types: Cigarettes  . Smokeless tobacco: Never Used  . Tobacco comment: 10 a day   Substance Use Topics  . Alcohol use: No  . Drug use: No    Review of Systems Per HPI unless specifically indicated above     Objective:    BP (!) 142/54 (BP Location: Right Arm, Patient Position: Sitting, Cuff Size: Normal)   Pulse (!) 53   Temp 97.8 F (36.6 C) (Oral)   Ht 5' (1.524 m)   Wt 144 lb 9.6 oz (65.6 kg)  BMI 28.24 kg/m   Wt Readings from Last 3 Encounters:  07/30/17 144 lb 9.6 oz (65.6 kg)  07/12/17 143 lb (64.9 kg)  06/25/17 147 lb (66.7 kg)    Physical Exam  Constitutional: She is oriented to person, place, and time. She appears well-developed and well-nourished. No distress.  Well-appearing, comfortable, cooperative  HENT:  Head: Normocephalic and atraumatic.  Mouth/Throat: Oropharynx is clear and moist.  Eyes: Conjunctivae are normal. Right eye exhibits no discharge. Left eye exhibits no discharge.  Cardiovascular: Normal rate.  Pulmonary/Chest: Effort normal.  Abdominal: Bowel sounds are normal. She exhibits no distension. There is no hepatomegaly. There is no tenderness. There is no rigidity, no rebound, no guarding, no tenderness at McBurney's point  and negative Murphy's sign. No hernia (No obvious herniation on exam - no evidence of incarcerated hernia or reducible hernia). Hernia confirmed negative in the ventral area (palpable areas of fibrotic nodularity and suspected borders of prior surgical mesh).  Musculoskeletal: She exhibits no edema.  Neurological: She is alert and oriented to person, place, and time.  Skin: Skin is warm and dry. No rash noted. She is not diaphoretic. No erythema.  Psychiatric: Her behavior is normal.  Nursing note and vitals reviewed.  Additional exam MSK - some generalized muscle atrophy on exam, difficulty with standing from seated position unassisted, requires cane and has some difficulty still, able to ambulate with cane, without focal muscle weakness Neuro - no focal neuro deficit, intact sensation  Results for orders placed or performed during the hospital encounter of 06/03/17  Comprehensive metabolic panel  Result Value Ref Range   Sodium 138 135 - 145 mmol/L   Potassium 4.3 3.5 - 5.1 mmol/L   Chloride 103 101 - 111 mmol/L   CO2 28 22 - 32 mmol/L   Glucose, Bld 91 65 - 99 mg/dL   BUN 23 (H) 6 - 20 mg/dL   Creatinine, Ser 4.09 0.44 - 1.00 mg/dL   Calcium 8.8 (L) 8.9 - 10.3 mg/dL   Total Protein 7.2 6.5 - 8.1 g/dL   Albumin 3.7 3.5 - 5.0 g/dL   AST 20 15 - 41 U/L   ALT 13 (L) 14 - 54 U/L   Alkaline Phosphatase 141 (H) 38 - 126 U/L   Total Bilirubin 0.5 0.3 - 1.2 mg/dL   GFR calc non Af Amer >60 >60 mL/min   GFR calc Af Amer >60 >60 mL/min   Anion gap 7 5 - 15  Ethanol  Result Value Ref Range   Alcohol, Ethyl (B) <10 <10 mg/dL  Urine Drug Screen, Qualitative  Result Value Ref Range   Tricyclic, Ur Screen NONE DETECTED NONE DETECTED   Amphetamines, Ur Screen NONE DETECTED NONE DETECTED   MDMA (Ecstasy)Ur Screen NONE DETECTED NONE DETECTED   Cocaine Metabolite,Ur [place] NONE DETECTED NONE DETECTED   Opiate, Ur Screen NONE DETECTED NONE DETECTED   Phencyclidine (PCP) Ur S NONE DETECTED NONE  DETECTED   Cannabinoid 50 Ng, Ur [place] NONE DETECTED NONE DETECTED   Barbiturates, Ur Screen NONE DETECTED NONE DETECTED   Benzodiazepine, Ur Scrn NONE DETECTED NONE DETECTED   Methadone Scn, Ur NONE DETECTED NONE DETECTED  Sedimentation rate  Result Value Ref Range   Sed Rate 35 (H) 0 - 30 mm/hr  Acetaminophen level  Result Value Ref Range   Acetaminophen (Tylenol), Serum <10 (L) 10 - 30 ug/mL  Salicylate level  Result Value Ref Range   Salicylate Lvl <7.0 2.8 - 30.0 mg/dL  CBC with Differential  Result Value Ref Range   WBC 5.3 3.6 - 11.0 K/uL   RBC 4.42 3.80 - 5.20 MIL/uL   Hemoglobin 13.4 12.0 - 16.0 g/dL   HCT 40.9 81.1 - 91.4 %   MCV 90.4 80.0 - 100.0 fL   MCH 30.2 26.0 - 34.0 pg   MCHC 33.4 32.0 - 36.0 g/dL   RDW 78.2 95.6 - 21.3 %   Platelets 264 150 - 440 K/uL   Neutrophils Relative % 52 %   Lymphocytes Relative 32 %   Monocytes Relative 14 %   Eosinophils Relative 1 %   Basophils Relative 1 %   Neutro Abs 2.7 1.4 - 6.5 K/uL   Lymphs Abs 1.7 1.0 - 3.6 K/uL   Monocytes Absolute 0.7 0.2 - 0.9 K/uL   Eosinophils Absolute 0.1 0 - 0.7 K/uL   Basophils Absolute 0.1 0 - 0.1 K/uL   Smear Review MORPHOLOGY UNREMARKABLE   Urinalysis, Complete w Microscopic  Result Value Ref Range   Color, Urine YELLOW (A) YELLOW   APPearance CLEAR (A) CLEAR   Specific Gravity, Urine 1.014 1.005 - 1.030   pH 6.0 5.0 - 8.0   Glucose, UA NEGATIVE NEGATIVE mg/dL   Hgb urine dipstick NEGATIVE NEGATIVE   Bilirubin Urine NEGATIVE NEGATIVE   Ketones, ur NEGATIVE NEGATIVE mg/dL   Protein, ur NEGATIVE NEGATIVE mg/dL   Nitrite NEGATIVE NEGATIVE   Leukocytes, UA SMALL (A) NEGATIVE   RBC / HPF 0-5 0 - 5 RBC/hpf   WBC, UA 0-5 0 - 5 WBC/hpf   Bacteria, UA RARE (A) NONE SEEN   Squamous Epithelial / LPF 0-5 (A) NONE SEEN   Mucus PRESENT    Hyaline Casts, UA PRESENT       Assessment & Plan:   Problem List Items Addressed This Visit    None    Visit Diagnoses    Right upper quadrant abdominal  pain    -  Primary   Swelling abdomen       S/P hernia repair      Clinically without obvious herniation today on abdominal exam. Seems to have improved over past 3 days, no further significant swelling or nodule or mass or herniation on exam that is reproducible. Still some residual discomfort with pressure on this area but non tender, and abdomen is benign not acutely abnormal on exam, with minimal pain. - Already on chronic opiates for pain, no change to medication at this time - Advised close monitoring observation, incase this is actually related to prior hernia/mesh repair may need to return to office if herniated or ED, otherwise consider future abdominal imaging for further characterization such as [place] vs CT, may also benefit from gen surgery consult, however given patient's age and frailty I would not necessarily recommend other surgical intervention for this patient now, especially if problem seems to be self limited and related to some chronic issues.     Generalized weakness       Physical deconditioning     No evidence of focal problem neuro deficit or acute illness causing her symptoms Appears to be mostly well hydrated Hemodynamically stable  Chronic problem with some recent subacute worsening, generalized deconditioning and weakness, some difficulty with proximal muscle weakness evident on difficulty standing with cane assistance only, able to ambulate fairly well but does still require some assistance, she should be using walker more regularly as advised avoid falls, has supervision current living situation. - Limited other options for her today, she will need to maximize  as much strengthening and nutrition as she can, which can be challenging - Follow-up as planned with PCP in 2 weeks        No orders of the defined types were placed in this encounter.   Follow up plan: Return in about 2 weeks (around 08/13/2017) for follow-up.  Already scheduled to see PCP [name],  AGPCNP-BC on 08/13/17  Saralyn Pilar, DO [facility] 07/30/2017, 11:10 AM

## 2017-08-06 ENCOUNTER — Other Ambulatory Visit: Payer: Self-pay

## 2017-08-06 ENCOUNTER — Emergency Department
Admission: EM | Admit: 2017-08-06 | Discharge: 2017-08-06 | Disposition: A | Discharge status: Home / Self Care | Payer: Medicare HMO | Attending: Emergency Medicine | Admitting: Emergency Medicine

## 2017-08-06 ENCOUNTER — Emergency Department: Payer: Medicare HMO

## 2017-08-06 DIAGNOSIS — I252 Old myocardial infarction: Secondary | ICD-10-CM | POA: Diagnosis not present

## 2017-08-06 DIAGNOSIS — R569 Unspecified convulsions: Secondary | ICD-10-CM

## 2017-08-06 DIAGNOSIS — F17210 Nicotine dependence, cigarettes, uncomplicated: Secondary | ICD-10-CM | POA: Insufficient documentation

## 2017-08-06 DIAGNOSIS — Z79899 Other long term (current) drug therapy: Secondary | ICD-10-CM | POA: Insufficient documentation

## 2017-08-06 DIAGNOSIS — J449 Chronic obstructive pulmonary disease, unspecified: Secondary | ICD-10-CM | POA: Diagnosis not present

## 2017-08-06 DIAGNOSIS — I10 Essential (primary) hypertension: Secondary | ICD-10-CM | POA: Insufficient documentation

## 2017-08-06 DIAGNOSIS — R51 Headache: Secondary | ICD-10-CM | POA: Insufficient documentation

## 2017-08-06 DIAGNOSIS — Z7982 Long term (current) use of aspirin: Secondary | ICD-10-CM | POA: Insufficient documentation

## 2017-08-06 DIAGNOSIS — I2510 Atherosclerotic heart disease of native coronary artery without angina pectoris: Secondary | ICD-10-CM | POA: Insufficient documentation

## 2017-08-06 LAB — COMPREHENSIVE METABOLIC PANEL
ALK PHOS: 133 U/L — AB (ref 38–126)
ALT: 10 U/L (ref 0–44)
ANION GAP: 7 (ref 5–15)
AST: 19 U/L (ref 15–41)
Albumin: 3.3 g/dL — ABNORMAL LOW (ref 3.5–5.0)
BILIRUBIN TOTAL: 0.3 mg/dL (ref 0.3–1.2)
BUN: 24 mg/dL — ABNORMAL HIGH (ref 8–23)
CALCIUM: 8.6 mg/dL — AB (ref 8.9–10.3)
CO2: 27 mmol/L (ref 22–32)
Chloride: 106 mmol/L (ref 98–111)
Creatinine, Ser: 0.71 mg/dL (ref 0.44–1.00)
Glucose, Bld: 127 mg/dL — ABNORMAL HIGH (ref 70–99)
POTASSIUM: 4 mmol/L (ref 3.5–5.1)
Sodium: 140 mmol/L (ref 135–145)
TOTAL PROTEIN: 6.6 g/dL (ref 6.5–8.1)

## 2017-08-06 LAB — CBC WITH DIFFERENTIAL/PLATELET
Basophils Absolute: 0 10*3/uL (ref 0–0.1)
Basophils Relative: 1 %
EOS ABS: 0.1 10*3/uL (ref 0–0.7)
EOS PCT: 2 %
HCT: 37.9 % (ref 35.0–47.0)
Hemoglobin: 13.1 g/dL (ref 12.0–16.0)
Lymphocytes Relative: 37 %
Lymphs Abs: 1.8 10*3/uL (ref 1.0–3.6)
MCH: 32.8 pg (ref 26.0–34.0)
MCHC: 34.6 g/dL (ref 32.0–36.0)
MCV: 94.8 fL (ref 80.0–100.0)
MONO ABS: 0.5 10*3/uL (ref 0.2–0.9)
MONOS PCT: 11 %
NEUTROS ABS: 2.4 10*3/uL (ref 1.4–6.5)
Neutrophils Relative %: 49 %
PLATELETS: 222 10*3/uL (ref 150–440)
RBC: 4 MIL/uL (ref 3.80–5.20)
RDW: 15.3 % — ABNORMAL HIGH (ref 11.5–14.5)
WBC: 4.8 10*3/uL (ref 3.6–11.0)

## 2017-08-06 LAB — PHENYTOIN LEVEL, TOTAL: PHENYTOIN LVL: 13.8 ug/mL (ref 10.0–20.0)

## 2017-08-06 MED ORDER — SODIUM CHLORIDE 0.9 % IV SOLN: 5.0000 mg/kg | Freq: Once | INTRAVENOUS | Status: DC

## 2017-08-06 MED ORDER — PHENYTOIN SODIUM 50 MG/ML IJ SOLN
325.0000 mg | Freq: Once | INTRAMUSCULAR | Status: AC
  Administered 2017-08-06: 325 mg via INTRAVENOUS
  Filled 2017-08-06: qty 5

## 2017-08-06 NOTE — ED Notes (Signed)
EMS at bedside. PT remains in NAD with no changes. Pt loaded onto EMS stretcher and transported home.

## 2017-08-06 NOTE — ED Notes (Signed)
[facility] called and inquiring about pt status. Informed him that pt would be discharge within the hour. Rocky states pt would need EMS to transport back to facility.

## 2017-08-06 NOTE — ED Triage Notes (Signed)
Pt comes from [facility] in [place]. Per staff pt was just finished eating and had a seizure. Fire also reports 2nd seizure. Pt has hx of mini seizures. Pt rates headache 8/10 and feels sleepy. VS-O2 89-90% room air- 2L applied, 140/60, BS-128, HR-60. Pt states she did take her seizure medication today before supper. Pt is alert and oriented.

## 2017-08-06 NOTE — ED Provider Notes (Signed)
[facility] Emergency Department Provider Note   ____________________________________________   First MD Initiated Contact with Patient 08/06/17 1915     (approximate)  I have reviewed the triage vital signs and the nursing notes.   HISTORY  Chief Complaint Seizures   HPI Sandy Daniel is a 84 y.o. female who has a history of epilepsy had at least 2 seizures today she woke up between them.  She is now back at baseline except for a headache.  Headache is moderately severe.  Review of the old records from back to 2016 show that she has seizures that began as paresthesias in the top of the head radiate down the arms followed by arms and legs shaking and then becomes unresponsive.  She also has a history of headaches that were treated with Fioricet she also had a history of seizures today with passing out shaking all over not sure if today's are any different than previously.  Past Medical History:  Diagnosis Date  . Arthritis   . Asthma   . B12 deficiency   . CAD (coronary artery disease)   . COPD (chronic obstructive pulmonary disease) (HCC)   . Depression   . Epilepsy (HCC)   . GERD (gastroesophageal reflux disease)   . Hyperlipidemia   . Hypertension   . Hypoxemia   . Macular degeneration (senile) of retina   . Macular degeneration of both eyes   . MI (myocardial infarction) (HCC) 09/2003, 05/2013  . Physiological tremor   . Presence of stent of CABG    x2   . Seizures [facility])     Patient Active Problem List   Diagnosis Date Noted  . 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 Major depressive disorder with single episode, in partial remission (HCC) 05/15/2017  . Neck pain 01/31/2017  . Chronic right shoulder pain 01/31/2017  . Chronic bilateral thoracic back pain 12/04/2016  . Muscle spasm of back 10/22/2016  . (HFpEF) heart failure with preserved ejection fraction (HCC) 09/11/2016  . Anxiety 07/16/2016  . Chronic  bilateral low back pain 07/16/2016  . Unstable gait 07/16/2016  . Controlled substance agreement signed 03/27/2016  . Dysphagia 03/26/2016  . Panic disorder 03/26/2016  . CAD (coronary artery disease)   . Seizure disorder (HCC)   . Macular degeneration of both eyes   . B12 deficiency   . COPD (chronic obstructive pulmonary disease) (HCC)   . HTN (hypertension)   . Hyperlipidemia   . Arthritis   . Depression   . GERD (gastroesophageal reflux disease)   . Physiological tremor 02/18/2014    Past Surgical History:  Procedure Laterality Date  . ABDOMINAL HYSTERECTOMY    . APPENDECTOMY    . CARDIAC CATHETERIZATION  05/2013  . CARDIAC SURGERY    . CATARACT EXTRACTION  03/2016  . CHOLECYSTECTOMY  1962  . CORONARY ANGIOPLASTY WITH STENT PLACEMENT  Aug. 2005 and March 2015  . ROTATOR CUFF REPAIR Right   . VENTRAL HERNIA REPAIR  2002    Prior to Admission medications   Medication Sig Start Date End Date Taking? Authorizing Provider  albuterol (PROVENTIL HFA;VENTOLIN HFA) 108 (90 Base) MCG/ACT inhaler Inhale 2 puffs into the lungs every 6 (six) hours as needed for wheezing or shortness of breath. 06/07/17   Dionne Bucy, MD  albuterol (PROVENTIL) (2.5 MG/3ML) 0.083% nebulizer solution Take 3 mLs (2.5 mg total) by nebulization every 6 (six) hours as needed for Wheezing. 06/07/17   Dionne Bucy, MD  aspirin  81 MG tablet Take 1 tablet (81 mg total) by mouth daily. 06/07/17   Dionne Bucy, MD  atorvastatin (LIPITOR) 10 MG tablet Take 1 tablet (10 mg total) by mouth daily. 06/12/17   Galen Manila, NP  busPIRone (BUSPAR) 7.5 MG tablet Take 1 tablet (7.5 mg total) by mouth 2 (two) times daily. 06/07/17   Dionne Bucy, MD  carvedilol (COREG) 3.125 MG tablet Take 1 tablet (3.125 mg total) by mouth 2 (two) times daily with a meal. 06/07/17   Dionne Bucy, MD  Fluticasone-Salmeterol (ADVAIR) 100-50 MCG/DOSE AEPB Inhale 1 puff into the lungs 2 (two) times daily.  06/07/17   Dionne Bucy, MD  furosemide (LASIX) 20 MG tablet Take 1 tablet (20 mg total) by mouth daily. 1/2 tab 06/07/17   Dionne Bucy, MD  gabapentin (NEURONTIN) 300 MG capsule Take 2 capsules (600 mg total) by mouth 3 (three) times daily as needed (moderate pain). 06/07/17   Dionne Bucy, MD  HYDROcodone-acetaminophen (NORCO) 7.5-325 MG tablet Take 1 tablet by mouth 2 (two) times daily as needed for moderate pain. For chronic pain. To fill on or after 06-13-2017, 07-13-2017, 08-11-2017 06/13/17   Edward Jolly, MD  Lidocaine 0.5 % GEL Apply 1 application topically 3 (three) times daily. 07/12/17   Enid Derry, PA-C  nitroGLYCERIN (NITROSTAT) 0.4 MG SL tablet Place 1 tablet (0.4 mg total) under the tongue every 5 (five) minutes as needed for chest pain. Patient not taking: Reported on 07/30/2017 06/12/17   Galen Manila, NP  pantoprazole (PROTONIX) 40 MG tablet Take 1 tablet (40 mg total) by mouth daily. 06/07/17   Dionne Bucy, MD  phenytoin (DILANTIN) 100 MG ER capsule Take 2 capsules (200 mg total) by mouth at bedtime. Take 2 by mouth at bedtime 06/07/17   Dionne Bucy, MD  QUEtiapine (SEROQUEL) 50 MG tablet Take 1 tablet (50 mg total) by mouth at bedtime. 06/07/17 08/06/17  Dionne Bucy, MD  sertraline (ZOLOFT) 100 MG tablet Take 1 tablet (100 mg total) by mouth daily. 06/07/17 08/06/17  Dionne Bucy, MD    Allergies Motrin [ibuprofen]; Tolmetin; Alendronate; Other; and Tramadol hcl  Family History  Problem Relation Age of Onset  . Diabetes Mother   . Cancer Sister   . Diabetes Sister   . Heart disease Sister   . Heart attack Sister   . Hyperlipidemia Sister   . Hypertension Sister   . Cancer Brother   . Diabetes Brother   . Heart disease Brother   . Heart attack Brother   . Hyperlipidemia Brother   . Hypertension Brother   . Diabetes Brother   . Diabetes Brother   . Kidney failure Brother     Social History Social History    Tobacco Use  . Smoking status: Current Every Day Smoker    Packs/day: 0.25    Years: 50.00    Pack years: 12.50    Types: Cigarettes  . Smokeless tobacco: Never Used  . Tobacco comment: 10 a day   Substance Use Topics  . Alcohol use: No  . Drug use: No    Review of Systems  Constitutional: No fever/chills Eyes: No visual changes. ENT: No sore throat. Cardiovascular: Denies chest pain. Respiratory: Denies shortness of breath. Gastrointestinal: No abdominal pain.  No nausea, no vomiting.  No diarrhea.  No constipation. Genitourinary: Negative for dysuria. Musculoskeletal: Negative for back pain. Skin: Negative for rash. Neurological: Negative for headaches, focal weakness  ____________________________________________   PHYSICAL EXAM:  VITAL SIGNS: ED Triage Vitals  Enc Vitals Group     BP 08/06/17 1847 (!) 150/58     Pulse Rate 08/06/17 1848 60     Resp 08/06/17 1857 18     Temp 08/06/17 1857 98.2 F (36.8 C)     Temp Source 08/06/17 1857 Oral     SpO2 08/06/17 1848 92 %     Weight 08/06/17 1849 144 lb (65.3 kg)     Height 08/06/17 1849 5\' 2"  (1.575 m)     Head Circumference --      Peak Flow --      Pain Score 08/06/17 1849 8     Pain Loc --      Pain Edu? --      Excl. in GC? --     Constitutional: Alert and oriented. Well appearing and in no acute distress. Eyes: Conjunctivae are normal. PERRL. EOMI. Head: Atraumatic. Nose: No congestion/rhinnorhea. Mouth/Throat: Mucous membranes are moist.  Oropharynx non-erythematous. Neck: No stridor.  No cervical spine tenderness to palpation. Cardiovascular: Normal rate, regular rhythm. Grossly normal heart sounds.  Good peripheral circulation. Respiratory: Normal respiratory effort.  No retractions. Lungs CTAB. Gastrointestinal: Soft and nontender. No distention. No abdominal bruits. No CVA tenderness. Musculoskeletal: No lower extremity tenderness nor edema.  No joint effusions. Neurologic:  Normal speech and  language. No gross focal neurologic deficits are appreciated.  Cranial nerves II through XII are intact although visual fields were not checked cerebellar finger-to-nose rapid alternating movements in the hands are normal and equal motor strength is 5/5 throughout sensation appears to be intact at the present time Skin:  Skin is warm, dry and intact. No rash noted. Psychiatric: Mood and affect are normal. Speech and behavior are normal.  ____________________________________________   LABS (all labs ordered are listed, but only abnormal results are displayed)  Labs Reviewed  COMPREHENSIVE METABOLIC PANEL - Abnormal; Notable for the following components:      Result Value   Glucose, Bld 127 (*)    BUN 24 (*)    Calcium 8.6 (*)    Albumin 3.3 (*)    Alkaline Phosphatase 133 (*)    All other components within normal limits  CBC WITH DIFFERENTIAL/PLATELET - Abnormal; Notable for the following components:   RDW 15.3 (*)    All other components within normal limits  PHENYTOIN LEVEL, TOTAL   ____________________________________________  EKG  EKG read and interpreted by me shows normal sinus rhythm rate of 60 left axis no acute ST-T wave changes computer is reading left anterior hemiblock ______________________________________  RADIOLOGY  ED MD interpretation: CT is negative for any acute pathology I reviewed the films  Official radiology report(s): Ct Head Wo Contrast  Result Date: 08/06/2017 CLINICAL DATA:  Seizure activity, headache EXAM: CT HEAD WITHOUT CONTRAST TECHNIQUE: Contiguous axial images were obtained from the base of the skull through the vertex without intravenous contrast. COMPARISON:  None. FINDINGS: Brain: Brain atrophy pattern and minor white matter microvascular ischemic changes throughout both cerebral hemispheres. No acute intracranial hemorrhage, mass lesion, new infarction, midline shift, herniation, hydrocephalus, or extra-axial fluid collection. Gray-white matter  differentiation maintained. No focal mass effect or edema. Cisterns are patent. Cerebellar atrophy as well. Vascular: Intracranial atherosclerosis.  No hyperdense vessel. Skull: Normal. Negative for fracture or focal lesion. Sinuses/Orbits: No acute finding. Other: None. IMPRESSION: Brain atrophy without acute intracranial abnormality by noncontrast CT. Electronically Signed   By: Judie Petit.  Shick M.D.   On: 08/06/2017 19:56    ____________________________________________   PROCEDURES  Procedure(s) performed:  Procedures  Critical Care performed:   ____________________________________________   INITIAL IMPRESSION / ASSESSMENT AND PLAN / ED COURSE  Patient's Dilantin level is toward the low end of the therapeutic range.  I will give her a small bolus and increase her from 200 to 300 mg of Dilantin a day.  I will have her follow-up with her regular doctor and with neurology and have her return if she is worse.         ____________________________________________   FINAL CLINICAL IMPRESSION(S) / ED DIAGNOSES  Final diagnoses:  Seizure [facility])     ED Discharge Orders    None       Note:  This document was prepared using Dragon voice recognition software and may include unintentional dictation errors.    Arnaldo Natal, MD 08/06/17 2011

## 2017-08-06 NOTE — ED Notes (Signed)
Pt assisted to toilet. Pt ambulated without difficulty with 2 assist.

## 2017-08-06 NOTE — ED Notes (Signed)
IV removed, pt dressed and ready for transport. Pt reports feeling tired at this time but denies changes new symptoms and rpeorts feeling safe to be discharged home. NAD noted at this time.

## 2017-08-06 NOTE — Discharge Instructions (Addendum)
Increase Dilantin to 300 mg of extended release Dilantin.  In the evening.  Follow-up with her doctor in the next few days.  Follow-up with neurology as soon as possible.  These call for an appointment.  Please return for any further problems.

## 2017-08-06 NOTE — ED Notes (Signed)
Called Pharmacy and spoke with them regarding medication and was told they were working on redosing it.

## 2017-08-06 NOTE — ED Notes (Signed)
Pt up to toilet 

## 2017-08-13 ENCOUNTER — Encounter: Payer: Self-pay | Admitting: Nurse Practitioner

## 2017-08-13 ENCOUNTER — Ambulatory Visit (INDEPENDENT_AMBULATORY_CARE_PROVIDER_SITE_OTHER): Payer: Medicare HMO | Admitting: Nurse Practitioner

## 2017-08-13 DIAGNOSIS — E785 Hyperlipidemia, unspecified: Secondary | ICD-10-CM | POA: Diagnosis not present

## 2017-08-13 DIAGNOSIS — F419 Anxiety disorder, unspecified: Secondary | ICD-10-CM

## 2017-08-13 DIAGNOSIS — K2100 Gastro-esophageal reflux disease with esophagitis, without bleeding: Secondary | ICD-10-CM

## 2017-08-13 DIAGNOSIS — F324 Major depressive disorder, single episode, in partial remission: Secondary | ICD-10-CM

## 2017-08-13 DIAGNOSIS — I10 Essential (primary) hypertension: Secondary | ICD-10-CM

## 2017-08-13 DIAGNOSIS — J431 Panlobular emphysema: Secondary | ICD-10-CM | POA: Diagnosis not present

## 2017-08-13 DIAGNOSIS — M542 Cervicalgia: Secondary | ICD-10-CM | POA: Diagnosis not present

## 2017-08-13 DIAGNOSIS — G40909 Epilepsy, unspecified, not intractable, without status epilepticus: Secondary | ICD-10-CM

## 2017-08-13 DIAGNOSIS — I5032 Chronic diastolic (congestive) heart failure: Secondary | ICD-10-CM

## 2017-08-13 MED ORDER — ALBUTEROL SULFATE HFA 108 (90 BASE) MCG/ACT IN AERS: 2.0000 | INHALATION_SPRAY | Freq: Four times a day (QID) | RESPIRATORY_TRACT | 1 refills | Status: AC | PRN

## 2017-08-13 MED ORDER — PANTOPRAZOLE SODIUM 40 MG PO TBEC: 40.0000 mg | DELAYED_RELEASE_TABLET | Freq: Every day | ORAL | 1 refills | Status: AC

## 2017-08-13 MED ORDER — BUSPIRONE HCL 10 MG PO TABS: 10.0000 mg | ORAL_TABLET | Freq: Two times a day (BID) | ORAL | 1 refills | Status: DC

## 2017-08-13 MED ORDER — FUROSEMIDE 20 MG PO TABS: 10.0000 mg | ORAL_TABLET | Freq: Every day | ORAL | 1 refills | Status: AC

## 2017-08-13 MED ORDER — FLUTICASONE-SALMETEROL 100-50 MCG/DOSE IN AEPB: 1.0000 | INHALATION_SPRAY | Freq: Two times a day (BID) | RESPIRATORY_TRACT | 1 refills | Status: DC

## 2017-08-13 MED ORDER — QUETIAPINE FUMARATE 50 MG PO TABS: 50.0000 mg | ORAL_TABLET | Freq: Every day | ORAL | 1 refills | Status: DC

## 2017-08-13 MED ORDER — SERTRALINE HCL 100 MG PO TABS
100.0000 mg | ORAL_TABLET | Freq: Every day | ORAL | 1 refills | Status: DC
Start: 2017-08-13 — End: 2017-08-13

## 2017-08-13 MED ORDER — ALBUTEROL SULFATE (2.5 MG/3ML) 0.083% IN NEBU: INHALATION_SOLUTION | RESPIRATORY_TRACT | 1 refills | Status: AC

## 2017-08-13 MED ORDER — CARVEDILOL 3.125 MG PO TABS: 3.1250 mg | ORAL_TABLET | Freq: Two times a day (BID) | ORAL | 1 refills | Status: DC

## 2017-08-13 MED ORDER — QUETIAPINE FUMARATE 50 MG PO TABS
50.0000 mg | ORAL_TABLET | Freq: Every day | ORAL | 1 refills | Status: AC
Start: 2017-08-13 — End: ?

## 2017-08-13 MED ORDER — SERTRALINE HCL 100 MG PO TABS: 100.0000 mg | ORAL_TABLET | Freq: Every day | ORAL | 1 refills | Status: AC

## 2017-08-13 MED ORDER — GABAPENTIN 400 MG PO CAPS: 800.0000 mg | ORAL_CAPSULE | Freq: Three times a day (TID) | ORAL | 1 refills | Status: AC

## 2017-08-13 MED ORDER — ATORVASTATIN CALCIUM 10 MG PO TABS: 10.0000 mg | ORAL_TABLET | Freq: Every day | ORAL | 1 refills | Status: AC

## 2017-08-13 MED ORDER — PHENYTOIN SODIUM EXTENDED 100 MG PO CAPS: 200.0000 mg | ORAL_CAPSULE | Freq: Every day | ORAL | 1 refills | Status: DC

## 2017-08-13 NOTE — Progress Notes (Signed)
Subjective:    Patient ID: Sandy Daniel, female    DOB: Mar 25, 1928, 84 y.o.   MRN: [id number]  Sandy Daniel is a 84 y.o. female presenting on 08/13/2017 for Hypertension (needs refills on meds) and Neck Pain (States she has had pain in neck x 1 year or more with no relief from pain meds used for back)   HPI Chronic pain - neck, thoracic spine Patient continues to complain of severe pain in left neck, shoulder and thoracic spine. She insists on sharing her story of onset of pain again citing it felt like a "golf ball hit her neck ... Something popped ... Something is broke."  [place] Fordyce is consistent with prior encounter documentation and current encounter provider recall.  Patient has had pain management referral with injection at last visit that "didn't work."  She also states her narcotic pain medication "doesn't last long enough."  She is taking one in am and the other at dinner.  By dinner, "pain is excruciating."  Patient still insists "no one has done an Xray." - No falls since ER visit May 2019.  She does ask about what she can do to improve balance. - Patient will tell any new medical provider that no doctor will look at it or examine it despite addressing this at nearly every visit since I met her and referral to pain management practice where nerve blocks/injections have previously failed.  GERD Patient reports occasional breakthrough heartburn, but is much better on her medication.  Continues to take pantoprazole 40 mg once daily.  COPD Patient has continued using advair daily, albuterol prn.  Patient reports no significant shortness of breath or wheezing.  Anxiety and Depression Patient reports significantly less problem with "nerves," but occasionally admits her "nerves could be better."  She denies all current depression.  Is still taking sertraline 100 mg once daily, Quetiapine 50 mg q hs, buspirone 7.5 mg bid.  She reports sleeping well without concerns  Depression screen  [facility] 2/9 08/13/2017 08/13/2017 06/13/2017 05/14/2017 04/11/2017  Decreased Interest 0 0 0 0 0  Down, Depressed, Hopeless 0 0 0 0 0  PHQ - 2 Score 0 0 0 0 0  Altered sleeping 0 - - - -  Tired, decreased energy 0 - - - -  Change in appetite 0 - - - -  Feeling bad or failure about yourself  0 - - - -  Trouble concentrating 0 - - - -  Moving slowly or fidgety/restless 0 - - - -  Suicidal thoughts 0 - - - -  PHQ-9 Score 0 - - - -  Difficult doing work/chores Not difficult at all - - - -     Social History   Tobacco Use  . Smoking status: Current Every Day Smoker    Packs/day: 1.00    Years: 50.00    Pack years: 50.00    Types: Cigarettes  . Smokeless tobacco: Never Used  . Tobacco comment: 10 a day   Substance Use Topics  . Alcohol use: No  . Drug use: No    Review of Systems  Constitutional: Negative for chills and fever.  HENT: Negative for congestion and sore throat.   Eyes: Negative for pain.  Respiratory: Negative for cough, shortness of breath and wheezing.   Cardiovascular: Negative for chest pain, palpitations and leg swelling.  Gastrointestinal: Negative for abdominal pain, blood in stool, constipation, diarrhea, nausea and vomiting.  Endocrine: Negative for polydipsia.  Genitourinary: Negative for difficulty urinating, dysuria, frequency,  hematuria and urgency.  Musculoskeletal: Positive for arthralgias, back pain and neck pain. Negative for myalgias.  Skin: Negative.  Negative for rash and wound.  Allergic/Immunologic: Negative for environmental allergies.  Neurological: Positive for speech difficulty (hoarseness) and weakness. Negative for dizziness, seizures and headaches.  Hematological: Does not bruise/bleed easily.  Psychiatric/Behavioral: Negative for dysphoric mood, self-injury, sleep disturbance and suicidal ideas. The patient is nervous/anxious.    Per HPI unless specifically indicated above     Objective:    BP 134/60   Pulse 83   Resp 16   Ht _0  (1.575  m)   Wt 143 lb (64.9 kg)   SpO2 94%   BMI 26.16 kg/m   Wt Readings from Last 3 Encounters:  08/13/17 143 lb (64.9 kg)  08/06/17 144 lb (65.3 kg)  07/30/17 144 lb 9.6 oz (65.6 kg)    Physical Exam  Constitutional: She is oriented to person, place, and time. She appears well-developed and well-nourished. No distress.  HENT:  Head: Normocephalic and atraumatic.  Neck: Normal range of motion. Neck supple. Carotid bruit is not present.  Cardiovascular: Normal rate, regular rhythm, S1 normal, S2 normal, normal heart sounds and intact distal pulses.  Pulmonary/Chest: Effort normal and breath sounds normal. No respiratory distress.  Musculoskeletal: She exhibits no edema (pedal).  C-Spine, T-Spine Inspection: Normal appearance of Cspine, Tspine with kyphosis, scoliosis. Palpation: No tenderness over spinous processes. Bilateral cervical paraspinal muscles tender and with hypertonicity/spasm.  Bilateral trapezius muscles tender and with hypertonicity/spasm ROM: Severely restricted AROM forward flex / back extension, rotation L/R with discomfort, Lateral bending limited in both directions L/R with discomfort Special Testing: Spurling's test negative for pain, pincer grasp normal strength, scaphoid snuff box present R hand Strength: 4/5 neck forward flex/extension, arm flex,ext, abduction/adduction. Neurovascular: intact distal sensation to light touch  Neurological: She is alert and oriented to person, place, and time.  Skin: Skin is warm and dry.  Psychiatric: She has a normal mood and affect. Her behavior is normal.  Vitals reviewed.    Results for orders placed or performed during the hospital encounter of 08/06/17  Comprehensive metabolic panel  Result Value Ref Range   Sodium 140 135 - 145 mmol/L   Potassium 4.0 3.5 - 5.1 mmol/L   Chloride 106 98 - 111 mmol/L   CO2 27 22 - 32 mmol/L   Glucose, Bld 127 (H) 70 - 99 mg/dL   BUN 24 (H) 8 - 23 mg/dL   Creatinine, Ser 0.71 0.44 - 1.00  mg/dL   Calcium 8.6 (L) 8.9 - 10.3 mg/dL   Total Protein 6.6 6.5 - 8.1 g/dL   Albumin 3.3 (L) 3.5 - 5.0 g/dL   AST 19 15 - 41 U/L   ALT 10 0 - 44 U/L   Alkaline Phosphatase 133 (H) 38 - 126 U/L   Total Bilirubin 0.3 0.3 - 1.2 mg/dL   GFR calc non Af Amer >60 >60 mL/min   GFR calc Af Amer >60 >60 mL/min   Anion gap 7 5 - 15  CBC with Differential  Result Value Ref Range   WBC 4.8 3.6 - 11.0 K/uL   RBC 4.00 3.80 - 5.20 MIL/uL   Hemoglobin 13.1 12.0 - 16.0 g/dL   HCT 37.9 35.0 - 47.0 %   MCV 94.8 80.0 - 100.0 fL   MCH 32.8 26.0 - 34.0 pg   MCHC 34.6 32.0 - 36.0 g/dL   RDW 15.3 (H) 11.5 - 14.5 %   Platelets 222 150 -  440 K/uL   Neutrophils Relative % 49 %   Neutro Abs 2.4 1.4 - 6.5 K/uL   Lymphocytes Relative 37 %   Lymphs Abs 1.8 1.0 - 3.6 K/uL   Monocytes Relative 11 %   Monocytes Absolute 0.5 0.2 - 0.9 K/uL   Eosinophils Relative 2 %   Eosinophils Absolute 0.1 0 - 0.7 K/uL   Basophils Relative 1 %   Basophils Absolute 0.0 0 - 0.1 K/uL  Phenytoin level, total  Result Value Ref Range   Phenytoin Lvl 13.8 10.0 - 20.0 ug/mL      Assessment & Plan:   Problem List Items Addressed This Visit      Cardiovascular and Mediastinum   HTN (hypertension) Stable.  Patient has normal BP on exam with current goal for patient < 140/90 with advanced age and history of falls.  Patient is not having any side effects from medications.   - Continue meds at current doses.   - Refills provided.  - Followup 3 months.   Relevant Medications   atorvastatin (LIPITOR) 10 MG tablet   carvedilol (COREG) 3.125 MG tablet   furosemide (LASIX) 20 MG tablet   Other Relevant Orders   COMPLETE METABOLIC PANEL WITH GFR   (HFpEF) heart failure with preserved ejection fraction (HCC) Stable without any exacerbations.  Leg swelling occurs rarely and is controlled today.  She continues taking lasix and tolerating well.   - Labs today. - Refills provided - follow-up 3 months    Relevant Medications    atorvastatin (LIPITOR) 10 MG tablet   carvedilol (COREG) 3.125 MG tablet   furosemide (LASIX) 20 MG tablet   Other Relevant Orders   COMPLETE METABOLIC PANEL WITH GFR   Lipid panel     Respiratory   COPD (chronic obstructive pulmonary disease) (HCC) Stable on exam today and per patient report.  Continues to use daily inhaler and albuterol most evenings.  She was unaware of albuterol as prn only. - Reinforced albuterol prn only -    Relevant Medications   albuterol (PROVENTIL HFA;VENTOLIN HFA) 108 (90 Base) MCG/ACT inhaler   albuterol (PROVENTIL) (2.5 MG/3ML) 0.083% nebulizer solution   Fluticasone-Salmeterol (ADVAIR) 100-50 MCG/DOSE AEPB   Other Relevant Orders   Spirometry with graph     Digestive   GERD (gastroesophageal reflux disease) Stable today on exam.  Medications tolerated without side effects.  Continue at current doses.  Refills provided.  Check labs today. Followup 3 months.    Relevant Medications   pantoprazole (PROTONIX) 40 MG tablet   Other Relevant Orders   CBC with Differential/Platelet     Nervous and Auditory   Seizure disorder ([place]) Stable without any recent seizures.  Continue medications.  Labs today to check dilantin level.  Refill provided.  Followup 3 months.   Relevant Medications   gabapentin (NEURONTIN) 400 MG capsule   phenytoin (DILANTIN) 100 MG ER capsule   Other Relevant Orders   Dilantin (Phenytoin) level, total     Other   Hyperlipidemia Stable today on exam.  Medications tolerated without side effects.  Continue at current doses.  Refills provided.  Check labs today. Followup 3 months.   Relevant Medications   atorvastatin (LIPITOR) 10 MG tablet   carvedilol (COREG) 3.125 MG tablet   furosemide (LASIX) 20 MG tablet   Other Relevant Orders   Lipid panel   Hemoglobin A1c   Anxiety Stable.  Patient doing well with buspirone bid and sertraline.  No changes necessary.  Patient states she is  much happier in her current residence.  Refills  provided.  Follow-up 3 months    Relevant Medications   busPIRone (BUSPAR) 10 MG tablet   sertraline (ZOLOFT) 100 MG tablet   Neck pain Chronic and stable.  Refill gabapentin.  Continue with Pain management for additional treatments.  Also concurrently manage anxiety and depression for better pain control.  Follow-up prn.   Relevant Medications   gabapentin (NEURONTIN) 400 MG capsule   Major depressive disorder with single episode, in partial remission ([place]) See anxiety AP.   Relevant Medications   busPIRone (BUSPAR) 10 MG tablet   QUEtiapine (SEROQUEL) 50 MG tablet   sertraline (ZOLOFT) 100 MG tablet      Meds ordered this encounter  Medications  . gabapentin (NEURONTIN) 400 MG capsule    Sig: Take 2 capsules (800 mg total) by mouth 3 (three) times daily.    Dispense:  540 capsule    Refill:  1    Order Specific Question:   Supervising Provider    Answer:   Olin Hauser [[id number]]  . albuterol (PROVENTIL HFA;VENTOLIN HFA) 108 (90 Base) MCG/ACT inhaler    Sig: Inhale 2 puffs into the lungs every 6 (six) hours as needed for wheezing or shortness of breath.    Dispense:  1 Inhaler    Refill:  1    Order Specific Question:   Supervising Provider    Answer:   Olin Hauser [[id number]]  . albuterol (PROVENTIL) (2.5 MG/3ML) 0.083% nebulizer solution    Sig: Take 3 mLs (2.5 mg total) by nebulization every 6 (six) hours as needed for Wheezing.    Dispense:  75 mL    Refill:  1    Order Specific Question:   Supervising Provider    Answer:   Olin Hauser [[id number]]  . atorvastatin (LIPITOR) 10 MG tablet    Sig: Take 1 tablet (10 mg total) by mouth daily.    Dispense:  90 tablet    Refill:  1    Order Specific Question:   Supervising Provider    Answer:   Olin Hauser [[id number]]  . busPIRone (BUSPAR) 10 MG tablet    Sig: Take 1 tablet (10 mg total) by mouth 2 (two) times daily.    Dispense:  180 tablet    Refill:  1    Order Specific Question:    Supervising Provider    Answer:   Olin Hauser [[id number]]  . carvedilol (COREG) 3.125 MG tablet    Sig: Take 1 tablet (3.125 mg total) by mouth 2 (two) times daily with a meal.    Dispense:  180 tablet    Refill:  1    Order Specific Question:   Supervising Provider    Answer:   Olin Hauser [[id number]]  . Fluticasone-Salmeterol (ADVAIR) 100-50 MCG/DOSE AEPB    Sig: Inhale 1 puff into the lungs 2 (two) times daily.    Dispense:  1 each    Refill:  1    Order Specific Question:   Supervising Provider    Answer:   Olin Hauser [[id number]]  . furosemide (LASIX) 20 MG tablet    Sig: Take 0.5 tablets (10 mg total) by mouth daily.    Dispense:  45 tablet    Refill:  1    Order Specific Question:   Supervising Provider    Answer:   Olin Hauser [[id number]]  . pantoprazole (PROTONIX) 40 MG tablet    Sig: Take 1  tablet (40 mg total) by mouth daily.    Dispense:  90 tablet    Refill:  1    Please consider 90 day supplies to promote better adherence    Order Specific Question:   Supervising Provider    Answer:   Olin Hauser [[id number]]  . phenytoin (DILANTIN) 100 MG ER capsule    Sig: Take 2 capsules (200 mg total) by mouth at bedtime. Take 2 by mouth at bedtime    Dispense:  30 capsule    Refill:  1    Order Specific Question:   Supervising Provider    Answer:   Olin Hauser [[id number]]  . QUEtiapine (SEROQUEL) 50 MG tablet    Sig: Take 1 tablet (50 mg total) by mouth at bedtime.    Dispense:  90 tablet    Refill:  1    Order Specific Question:   Supervising Provider    Answer:   Olin Hauser [[id number]]  . sertraline (ZOLOFT) 100 MG tablet    Sig: Take 1 tablet (100 mg total) by mouth daily.    Dispense:  90 tablet    Refill:  1    Order Specific Question:   Supervising Provider    Answer:   Olin Hauser [[id number]]    Follow up plan: Return in about 3 months (around 11/13/2017) for hypertension, COPD, GERD AND spirometry  in clinic 1 month .  Cassell Smiles, DNP, AGPCNP-BC Adult Gerontology Primary Care Nurse Practitioner [facility] 08/13/2017, 9:53 AM

## 2017-08-13 NOTE — Patient Instructions (Addendum)
Sandy Daniel,   Thank you for coming in to clinic today.  1. Increase buspar to 10 mg twice daily 2. Increase gabapentin to 400 mg tablet.  Take 800 mg per dose three times daily. 3. Call pain management to see if they can see you sooner for pain management.  4. Breathing test in 1 month here in clinic  Please schedule a follow-up appointment with Sandy Daniel, AGNP. Return in about 3 months (around 11/13/2017) for hypertension, COPD, GERD AND spirometry in clinic 1 month .  If you have any other questions or concerns, please feel free to call the clinic or send a message through MyChart. You may also schedule an earlier appointment if necessary.  You will receive a survey after today's visit either digitally by e-mail or paper by [employer]. Your experiences and feedback matter to [place].  Please respond so we know how we are doing as we provide care for you.   Sandy Mcardle, DNP, AGNP-BC Adult Gerontology Nurse Practitioner [facility], [facility]

## 2017-08-14 LAB — COMPLETE METABOLIC PANEL WITH GFR
AG Ratio: 1.4 (calc) (ref 1.0–2.5)
ALT: 26 U/L (ref 6–29)
AST: 49 U/L — ABNORMAL HIGH (ref 10–35)
Albumin: 3.8 g/dL (ref 3.6–5.1)
Alkaline phosphatase (APISO): 156 U/L — ABNORMAL HIGH (ref 33–130)
BUN: 24 mg/dL (ref 7–25)
CO2: 30 mmol/L (ref 20–32)
Calcium: 8.7 mg/dL (ref 8.6–10.4)
Chloride: 108 mmol/L (ref 98–110)
Creat: 0.86 mg/dL (ref 0.60–0.88)
GFR, Est African American: 70 mL/min/{1.73_m2} (ref >=60)
GFR, Est Non African American: 61 mL/min/{1.73_m2} (ref >=60)
Globulin: 2.7 g/dL (calc) (ref 1.9–3.7)
Glucose, Bld: 88 mg/dL (ref 65–139)
Potassium: 3.9 mmol/L (ref 3.5–5.3)
Sodium: 145 mmol/L (ref 135–146)
Total Bilirubin: 0.4 mg/dL (ref 0.2–1.2)
Total Protein: 6.5 g/dL (ref 6.1–8.1)

## 2017-08-14 LAB — HEMOGLOBIN A1C
Hgb A1c MFr Bld: 5.5 % of total Hgb (ref <5.7)
Mean Plasma Glucose: 111 (calc)
eAG (mmol/L): 6.2 (calc)

## 2017-08-14 LAB — CBC WITH DIFFERENTIAL/PLATELET
Basophils Absolute: 28 cells/uL (ref 0–200)
Basophils Relative: 0.6 %
Eosinophils Absolute: 61 cells/uL (ref 15–500)
Eosinophils Relative: 1.3 %
HCT: 39.7 % (ref 35.0–45.0)
Hemoglobin: 12.8 g/dL (ref 11.7–15.5)
Lymphs Abs: 1142 cells/uL (ref 850–3900)
MCH: 28.3 pg (ref 27.0–33.0)
MCHC: 32.2 g/dL (ref 32.0–36.0)
MCV: 87.8 fL (ref 80.0–100.0)
MPV: 10.3 fL (ref 7.5–12.5)
Monocytes Relative: 8.5 %
Neutro Abs: 3069 cells/uL (ref 1500–7800)
Neutrophils Relative %: 65.3 %
Platelets: 265 10*3/uL (ref 140–400)
RBC: 4.52 10*6/uL (ref 3.80–5.10)
RDW: 13.5 % (ref 11.0–15.0)
Total Lymphocyte: 24.3 %
WBC mixed population: 400 cells/uL (ref 200–950)
WBC: 4.7 10*3/uL (ref 3.8–10.8)

## 2017-08-14 LAB — LIPID PANEL
Cholesterol: 135 mg/dL (ref <200)
HDL: 52 mg/dL (ref >50)
LDL Cholesterol (Calc): 62 mg/dL (calc)
Non-HDL Cholesterol (Calc): 83 mg/dL (calc) (ref <130)
Total CHOL/HDL Ratio: 2.6 (calc) (ref <5.0)
Triglycerides: 120 mg/dL (ref <150)

## 2017-08-14 LAB — PHENYTOIN LEVEL, TOTAL: Phenytoin, Total: 14.8 mg/L (ref 10.0–20.0)

## 2017-08-19 ENCOUNTER — Telehealth: Payer: Self-pay

## 2017-08-19 ENCOUNTER — Encounter: Payer: Self-pay | Admitting: Nurse Practitioner

## 2017-08-19 NOTE — Telephone Encounter (Signed)
Attempted to contact the pt.

## 2017-09-07 ENCOUNTER — Observation Stay
Admission: EM | Admit: 2017-09-07 | Discharge: 2017-09-08 | Disposition: A | Discharge status: Home / Self Care | Payer: Medicare HMO | Attending: Internal Medicine | Admitting: Internal Medicine

## 2017-09-07 ENCOUNTER — Emergency Department: Payer: Medicare HMO

## 2017-09-07 ENCOUNTER — Other Ambulatory Visit: Payer: Self-pay

## 2017-09-07 DIAGNOSIS — E785 Hyperlipidemia, unspecified: Secondary | ICD-10-CM | POA: Insufficient documentation

## 2017-09-07 DIAGNOSIS — I119 Hypertensive heart disease without heart failure: Secondary | ICD-10-CM | POA: Insufficient documentation

## 2017-09-07 DIAGNOSIS — Z888 Allergy status to other drugs, medicaments and biological substances status: Secondary | ICD-10-CM | POA: Insufficient documentation

## 2017-09-07 DIAGNOSIS — Z7982 Long term (current) use of aspirin: Secondary | ICD-10-CM | POA: Diagnosis not present

## 2017-09-07 DIAGNOSIS — K219 Gastro-esophageal reflux disease without esophagitis: Secondary | ICD-10-CM | POA: Diagnosis not present

## 2017-09-07 DIAGNOSIS — J449 Chronic obstructive pulmonary disease, unspecified: Secondary | ICD-10-CM | POA: Diagnosis not present

## 2017-09-07 DIAGNOSIS — Z886 Allergy status to analgesic agent status: Secondary | ICD-10-CM | POA: Insufficient documentation

## 2017-09-07 DIAGNOSIS — R072 Precordial pain: Secondary | ICD-10-CM

## 2017-09-07 DIAGNOSIS — M1990 Unspecified osteoarthritis, unspecified site: Secondary | ICD-10-CM | POA: Insufficient documentation

## 2017-09-07 DIAGNOSIS — I2510 Atherosclerotic heart disease of native coronary artery without angina pectoris: Secondary | ICD-10-CM | POA: Diagnosis not present

## 2017-09-07 DIAGNOSIS — R0789 Other chest pain: Secondary | ICD-10-CM | POA: Diagnosis not present

## 2017-09-07 DIAGNOSIS — I252 Old myocardial infarction: Secondary | ICD-10-CM | POA: Insufficient documentation

## 2017-09-07 DIAGNOSIS — Z79899 Other long term (current) drug therapy: Secondary | ICD-10-CM | POA: Insufficient documentation

## 2017-09-07 DIAGNOSIS — I200 Unstable angina: Secondary | ICD-10-CM

## 2017-09-07 DIAGNOSIS — R079 Chest pain, unspecified: Secondary | ICD-10-CM | POA: Diagnosis present

## 2017-09-07 DIAGNOSIS — F17210 Nicotine dependence, cigarettes, uncomplicated: Secondary | ICD-10-CM | POA: Insufficient documentation

## 2017-09-07 DIAGNOSIS — F419 Anxiety disorder, unspecified: Secondary | ICD-10-CM | POA: Diagnosis not present

## 2017-09-07 DIAGNOSIS — R001 Bradycardia, unspecified: Secondary | ICD-10-CM | POA: Diagnosis not present

## 2017-09-07 DIAGNOSIS — Z955 Presence of coronary angioplasty implant and graft: Secondary | ICD-10-CM | POA: Diagnosis not present

## 2017-09-07 DIAGNOSIS — G8929 Other chronic pain: Secondary | ICD-10-CM | POA: Insufficient documentation

## 2017-09-07 LAB — TROPONIN I: Troponin I: <0.03 ng/mL (ref <0.03)

## 2017-09-07 LAB — BASIC METABOLIC PANEL
Anion gap: 6 (ref 5–15)
BUN: 14 mg/dL (ref 8–23)
CALCIUM: 8.4 mg/dL — AB (ref 8.9–10.3)
CO2: 30 mmol/L (ref 22–32)
Chloride: 108 mmol/L (ref 98–111)
Creatinine, Ser: 0.76 mg/dL (ref 0.44–1.00)
Glucose, Bld: 101 mg/dL — ABNORMAL HIGH (ref 70–99)
Potassium: 3.5 mmol/L (ref 3.5–5.1)
Sodium: 144 mmol/L (ref 135–145)

## 2017-09-07 LAB — CBC
HEMATOCRIT: 39.8 % (ref 35.0–47.0)
Hemoglobin: 13.4 g/dL (ref 12.0–16.0)
MCH: 30.8 pg (ref 26.0–34.0)
MCHC: 33.7 g/dL (ref 32.0–36.0)
MCV: 91.5 fL (ref 80.0–100.0)
Platelets: 209 10*3/uL (ref 150–440)
RBC: 4.34 MIL/uL (ref 3.80–5.20)
RDW: 16.3 % — AB (ref 11.5–14.5)
WBC: 3.8 10*3/uL (ref 3.6–11.0)

## 2017-09-07 MED ORDER — GI COCKTAIL ~~LOC~~
30.0000 mL | Freq: Four times a day (QID) | ORAL | Status: DC | PRN
Start: 2017-09-07 — End: 2017-09-08
  Filled 2017-09-07: qty 30

## 2017-09-07 MED ORDER — BUSPIRONE HCL 10 MG PO TABS
10.0000 mg | ORAL_TABLET | Freq: Two times a day (BID) | ORAL | Status: DC
  Administered 2017-09-07 – 2017-09-08 (×3): 10 mg via ORAL
  Filled 2017-09-07 (×4): qty 1

## 2017-09-07 MED ORDER — ATORVASTATIN CALCIUM 10 MG PO TABS
10.0000 mg | ORAL_TABLET | Freq: Every day | ORAL | Status: DC
  Administered 2017-09-07 – 2017-09-08 (×2): 10 mg via ORAL
  Filled 2017-09-07 (×2): qty 1

## 2017-09-07 MED ORDER — NITROGLYCERIN 0.4 MG SL SUBL
0.4000 mg | SUBLINGUAL_TABLET | SUBLINGUAL | Status: DC | PRN
  Administered 2017-09-07: 0.4 mg via SUBLINGUAL
  Filled 2017-09-07: qty 1

## 2017-09-07 MED ORDER — ISOSORBIDE MONONITRATE ER 30 MG PO TB24
30.0000 mg | ORAL_TABLET | Freq: Every day | ORAL | Status: DC
  Administered 2017-09-07 – 2017-09-08 (×2): 30 mg via ORAL
  Filled 2017-09-07 (×2): qty 1

## 2017-09-07 MED ORDER — ZOLPIDEM TARTRATE 5 MG PO TABS: 5.0000 mg | ORAL_TABLET | Freq: Every evening | ORAL | Status: DC | PRN

## 2017-09-07 MED ORDER — HYDRALAZINE HCL 20 MG/ML IJ SOLN: 10.0000 mg | Freq: Four times a day (QID) | INTRAMUSCULAR | Status: DC | PRN

## 2017-09-07 MED ORDER — GABAPENTIN 400 MG PO CAPS
800.0000 mg | ORAL_CAPSULE | Freq: Three times a day (TID) | ORAL | Status: DC
  Administered 2017-09-07 – 2017-09-08 (×3): 800 mg via ORAL
  Filled 2017-09-07 (×3): qty 2

## 2017-09-07 MED ORDER — PANTOPRAZOLE SODIUM 40 MG PO TBEC
40.0000 mg | DELAYED_RELEASE_TABLET | Freq: Every day | ORAL | Status: DC
  Administered 2017-09-07 – 2017-09-08 (×2): 40 mg via ORAL
  Filled 2017-09-07 (×2): qty 1

## 2017-09-07 MED ORDER — GUAIFENESIN 100 MG/5ML PO SOLN
5.0000 mL | ORAL | Status: DC | PRN
  Administered 2017-09-07 (×2): 100 mg via ORAL
  Filled 2017-09-07 (×4): qty 5

## 2017-09-07 MED ORDER — ALBUTEROL SULFATE (2.5 MG/3ML) 0.083% IN NEBU
2.5000 mg | INHALATION_SOLUTION | RESPIRATORY_TRACT | Status: DC | PRN
  Administered 2017-09-07: 2.5 mg via RESPIRATORY_TRACT
  Filled 2017-09-07: qty 3

## 2017-09-07 MED ORDER — HYDROCODONE-ACETAMINOPHEN 7.5-325 MG PO TABS
1.0000 | ORAL_TABLET | Freq: Two times a day (BID) | ORAL | Status: DC | PRN
  Administered 2017-09-07: 1 via ORAL
  Filled 2017-09-07: qty 1

## 2017-09-07 MED ORDER — QUETIAPINE FUMARATE 25 MG PO TABS
50.0000 mg | ORAL_TABLET | Freq: Every day | ORAL | Status: DC
  Administered 2017-09-07: 50 mg via ORAL
  Filled 2017-09-07: qty 2

## 2017-09-07 MED ORDER — AMLODIPINE BESYLATE 5 MG PO TABS
5.0000 mg | ORAL_TABLET | Freq: Every day | ORAL | Status: DC
  Administered 2017-09-07 – 2017-09-08 (×2): 5 mg via ORAL
  Filled 2017-09-07 (×2): qty 1

## 2017-09-07 MED ORDER — NITROGLYCERIN 0.4 MG SL SUBL: 0.4000 mg | SUBLINGUAL_TABLET | SUBLINGUAL | Status: DC | PRN

## 2017-09-07 MED ORDER — ACETAMINOPHEN 325 MG PO TABS: 650.0000 mg | ORAL_TABLET | ORAL | Status: DC | PRN

## 2017-09-07 MED ORDER — ASPIRIN EC 325 MG PO TBEC
325.0000 mg | DELAYED_RELEASE_TABLET | Freq: Every day | ORAL | Status: DC
  Administered 2017-09-07 – 2017-09-08 (×2): 325 mg via ORAL
  Filled 2017-09-07 (×2): qty 1

## 2017-09-07 MED ORDER — LISINOPRIL 5 MG PO TABS
5.0000 mg | ORAL_TABLET | Freq: Every day | ORAL | Status: DC
  Administered 2017-09-07 – 2017-09-08 (×2): 5 mg via ORAL
  Filled 2017-09-07 (×2): qty 1

## 2017-09-07 MED ORDER — FUROSEMIDE 20 MG PO TABS
10.0000 mg | ORAL_TABLET | Freq: Every day | ORAL | Status: DC
  Administered 2017-09-07 – 2017-09-08 (×2): 10 mg via ORAL
  Filled 2017-09-07 (×2): qty 1

## 2017-09-07 MED ORDER — MAGNESIUM HYDROXIDE 400 MG/5ML PO SUSP
30.0000 mL | Freq: Two times a day (BID) | ORAL | Status: DC
  Administered 2017-09-08: 30 mL via ORAL
  Filled 2017-09-07: qty 30

## 2017-09-07 MED ORDER — MOMETASONE FURO-FORMOTEROL FUM 100-5 MCG/ACT IN AERO
2.0000 | INHALATION_SPRAY | Freq: Two times a day (BID) | RESPIRATORY_TRACT | Status: DC
  Administered 2017-09-08: 2 via RESPIRATORY_TRACT
  Filled 2017-09-07: qty 8.8

## 2017-09-07 MED ORDER — SERTRALINE HCL 50 MG PO TABS
100.0000 mg | ORAL_TABLET | Freq: Every day | ORAL | Status: DC
  Administered 2017-09-07 – 2017-09-08 (×2): 100 mg via ORAL
  Filled 2017-09-07 (×2): qty 2

## 2017-09-07 MED ORDER — ONDANSETRON HCL 4 MG/2ML IJ SOLN: 4.0000 mg | Freq: Four times a day (QID) | INTRAMUSCULAR | Status: DC | PRN

## 2017-09-07 MED ORDER — PHENYTOIN SODIUM EXTENDED 100 MG PO CAPS
200.0000 mg | ORAL_CAPSULE | Freq: Every day | ORAL | Status: DC
  Administered 2017-09-07: 200 mg via ORAL
  Filled 2017-09-07 (×4): qty 2

## 2017-09-07 MED ORDER — ALPRAZOLAM 0.5 MG PO TABS: 0.2500 mg | ORAL_TABLET | Freq: Two times a day (BID) | ORAL | Status: DC | PRN

## 2017-09-07 MED ORDER — ENOXAPARIN SODIUM 40 MG/0.4ML ~~LOC~~ SOLN
40.0000 mg | SUBCUTANEOUS | Status: DC
  Administered 2017-09-07: 40 mg via SUBCUTANEOUS
  Filled 2017-09-07: qty 0.4

## 2017-09-07 MED ORDER — NICOTINE 14 MG/24HR TD PT24
14.0000 mg | MEDICATED_PATCH | Freq: Every day | TRANSDERMAL | Status: DC
  Administered 2017-09-08: 14 mg via TRANSDERMAL
  Filled 2017-09-07 (×2): qty 1

## 2017-09-07 MED ORDER — MORPHINE SULFATE (PF) 2 MG/ML IV SOLN: 2.0000 mg | INTRAVENOUS | Status: DC | PRN

## 2017-09-07 NOTE — ED Notes (Signed)
Pt called out asking where her pink jacket is. Informed pt that jacket was in the chair with her other clothes. Pt verbalized understanding.

## 2017-09-07 NOTE — [place] FL2 (Addendum)
[place] MEDICAID FL2 LEVEL OF CARE SCREENING TOOL     IDENTIFICATION  Patient Name: Sandy Daniel Birthdate: 05-29-1928 Sex: female Admission Date (Current Location): 09/07/2017  [place] and [place] Number:  Chiropodist and Address:  [facility], [address] [place], [place] [address]      Provider Number: [id number]  Attending Physician Name and Address:  Auburn Bilberry, MD Relative Name and Phone Number:   Flint Melter [phone]    Current Level of Care: Hospital Recommended Level of Care: Family Care Home([facility] Home) Prior Approval Number:    Date Approved/Denied:   PASRR Number:    Discharge Plan: Other (Comment)([facility] care Group Home)    Current Diagnoses: Patient Active Problem List   Diagnosis Date Noted  . Chest pain 09/07/2017  . 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 Major depressive disorder with single episode, in partial remission (HCC) 05/15/2017  . Neck pain 01/31/2017  . Chronic right shoulder pain 01/31/2017  . Chronic bilateral thoracic back pain 12/04/2016  . Muscle spasm of back 10/22/2016  . (HFpEF) heart failure with preserved ejection fraction (HCC) 09/11/2016  . Anxiety 07/16/2016  . Chronic bilateral low back pain 07/16/2016  . Unstable gait 07/16/2016  . Controlled substance agreement signed 03/27/2016  . Dysphagia 03/26/2016  . Panic disorder 03/26/2016  . CAD (coronary artery disease)   . Seizure disorder (HCC)   . Macular degeneration of both eyes   . B12 deficiency   . COPD (chronic obstructive pulmonary disease) (HCC)   . HTN (hypertension)   . Hyperlipidemia   . Arthritis   . Depression   . GERD (gastroesophageal reflux disease)   . Physiological tremor 02/18/2014    Orientation RESPIRATION BLADDER Height & Weight     Self, Time, Situation, Place  Normal Incontinent Weight: 146 lb 8 oz (66.5 kg) Height:  5\' 2"  (157.5 cm)  BEHAVIORAL  SYMPTOMS/MOOD NEUROLOGICAL BOWEL NUTRITION STATUS      Continent Diet(Normal)  AMBULATORY STATUS COMMUNICATION OF NEEDS Skin   Independent Verbally Normal                       Personal Care Assistance Level of Assistance  Bathing, Feeding, Dressing, Total care Bathing Assistance: Limited assistance Feeding assistance: Independent Dressing Assistance: Independent Total Care Assistance: Limited assistance   Functional Limitations Info  Sight, Hearing, Speech Sight Info: Adequate(wears glasses) Hearing Info: Impaired(Hard of hearing) Speech Info: Adequate    SPECIAL CARE FACTORS FREQUENCY                       Contractures Contractures Info: Not present    Additional Factors Info  Allergies   Allergies Info: Motrin, Tolmetin Alendrondate           Current Medications (09/07/2017):  This is the current hospital active medication list Current Facility-Administered Medications  Medication Dose Route Frequency Provider Last Rate Last Dose  . acetaminophen (TYLENOL) tablet 650 mg  650 mg Oral Q4H PRN Shaune Pollack, MD      . albuterol (PROVENTIL) (2.5 MG/3ML) 0.083% nebulizer solution 2.5 mg  2.5 mg Nebulization Q4H PRN Shaune Pollack, MD      . ALPRAZolam Prudy Feeler) tablet 0.25 mg  0.25 mg Oral BID PRN Shaune Pollack, MD      . amLODipine (NORVASC) tablet 5 mg  5 mg Oral Daily Shaune Pollack, MD      . aspirin EC tablet 325  mg  325 mg Oral Daily Shaune Pollack, MD      . atorvastatin (LIPITOR) tablet 10 mg  10 mg Oral Daily Shaune Pollack, MD      . busPIRone (BUSPAR) tablet 10 mg  10 mg Oral BID Shaune Pollack, MD      . enoxaparin (LOVENOX) injection 40 mg  40 mg Subcutaneous Q24H Shaune Pollack, MD      . furosemide (LASIX) tablet 10 mg  10 mg Oral Daily Shaune Pollack, MD      . gabapentin (NEURONTIN) capsule 800 mg  800 mg Oral TID Shaune Pollack, MD      . gi cocktail (Maalox,Lidocaine,Donnatal)  30 mL Oral QID PRN Shaune Pollack, MD      . hydrALAZINE (APRESOLINE) injection 10 mg  10 mg Intravenous Q6H  PRN Shaune Pollack, MD      . HYDROcodone-acetaminophen [facility]) 7.5-325 MG per tablet 1 tablet  1 tablet Oral BID PRN Shaune Pollack, MD      . isosorbide mononitrate (IMDUR) 24 hr tablet 30 mg  30 mg Oral Daily Shaune Pollack, MD      . lisinopril (PRINIVIL,ZESTRIL) tablet 5 mg  5 mg Oral Daily Shaune Pollack, MD      . mometasone-formoterol [facility]) 100-5 MCG/ACT inhaler 2 puff  2 puff Inhalation BID Shaune Pollack, MD      . morphine 2 MG/ML injection 2 mg  2 mg Intravenous Q2H PRN Shaune Pollack, MD      . nicotine (NICODERM CQ - dosed in mg/24 hours) patch 14 mg  14 mg Transdermal Daily Shaune Pollack, MD      . nitroGLYCERIN (NITROSTAT) SL tablet 0.4 mg  0.4 mg Sublingual Q5 min PRN Shaune Pollack, MD      . ondansetron [facility]) injection 4 mg  4 mg Intravenous Q6H PRN Shaune Pollack, MD      . pantoprazole (PROTONIX) EC tablet 40 mg  40 mg Oral Daily Shaune Pollack, MD      . phenytoin (DILANTIN) ER capsule 200 mg  200 mg Oral QHS Shaune Pollack, MD      . QUEtiapine (SEROQUEL) tablet 50 mg  50 mg Oral QHS Shaune Pollack, MD      . sertraline (ZOLOFT) tablet 100 mg  100 mg Oral Daily Shaune Pollack, MD      . zolpidem (AMBIEN) tablet 5 mg  5 mg Oral QHS PRN Shaune Pollack, MD         Discharge Medications: STOP taking these medications   carvedilol 3.125 MG tablet Commonly known as:  COREG     TAKE these medications   albuterol 108 (90 Base) MCG/ACT inhaler Commonly known as:  PROVENTIL HFA;VENTOLIN HFA Inhale 2 puffs into the lungs every 6 (six) hours as needed for wheezing or shortness of breath.   albuterol (2.5 MG/3ML) 0.083% nebulizer solution Commonly known as:  PROVENTIL Take 3 mLs (2.5 mg total) by nebulization every 6 (six) hours as needed for Wheezing.   aspirin 81 MG tablet Take 1 tablet (81 mg total) by mouth daily.   atorvastatin 10 MG tablet Commonly known as:  LIPITOR Take 1 tablet (10 mg total) by mouth daily.   busPIRone 10 MG tablet Commonly known as:  BUSPAR Take 1 tablet (10 mg total) by mouth 2  (two) times daily.   Fluticasone-Salmeterol 100-50 MCG/DOSE Aepb Commonly known as:  ADVAIR Inhale 1 puff into the lungs 2 (two) times daily.   furosemide 20 MG tablet Commonly known as:  LASIX Take  0.5 tablets (10 mg total) by mouth daily.   gabapentin 400 MG capsule Commonly known as:  NEURONTIN Take 2 capsules (800 mg total) by mouth 3 (three) times daily.   guaiFENesin-dextromethorphan 100-10 MG/5ML syrup Commonly known as:  ROBITUSSIN DM Take 15 mLs by mouth every 6 (six) hours as needed for cough.   HYDROcodone-acetaminophen 7.5-325 MG tablet Commonly known as:  NORCO Take 1 tablet by mouth 2 (two) times daily as needed for moderate pain. For chronic pain. To fill on or after 06-13-2017, 07-13-2017, 08-11-2017   Lidocaine 0.5 % Gel Apply 1 application topically 3 (three) times daily.   nitroGLYCERIN 0.4 MG SL tablet Commonly known as:  NITROSTAT Place 1 tablet (0.4 mg total) under the tongue every 5 (five) minutes as needed for chest pain.   pantoprazole 40 MG tablet Commonly known as:  PROTONIX Take 1 tablet (40 mg total) by mouth daily.   phenytoin 100 MG ER capsule Commonly known as:  DILANTIN Take 2 capsules (200 mg total) by mouth at bedtime. Take 2 by mouth at bedtime   QUEtiapine 50 MG tablet Commonly known as:  SEROQUEL Take 1 tablet (50 mg total) by mouth at bedtime.   sertraline 100 MG tablet Commonly known as:  ZOLOFT Take 1 tablet (100 mg total) by mouth daily.      Relevant Imaging Results:  Relevant Lab Results:   Additional Information SSN: [id number]  Cheron Schaumann, [place]

## 2017-09-07 NOTE — Consult Note (Signed)
Cardiology Consultation:   Patient ID: Sandy Daniel; [id number]; May 03, 1928   Admit date: 09/07/2017 Date of Consult: 09/07/2017  Primary Care Provider: Galen Manila, NP Primary Cardiologist: No primary care provider on file. [facility] (former pt of [facility]).    Patient Profile:   Sandy Daniel is a 84 y.o. female with a history of CAD, HTN, HLD, GERD, depression, COPD who is being seen today for the evaluation of chest pain at the request of Dr. Imogene Burn.  History of Present Illness:   Sandy Daniel is a 84 y.o. female with a history of CAD, HTN, HLD, GERD, depression and COPD admitted today with chest pain. She has a history of CAD with prior stent placement in the LAD and Circumflex. Last cath reported in the cardiology office notes was in 2017 or 2018 and at that time her stents were patent in the LAD and Circumflex and there was minimal disease in the RCA. That cath was performed in the setting of chest pain. I cannot find this in the chart.   She tells me today that she woke up this am and went out to smoke a cigarette. She had the onset of severe chest pain while sitting. She took 2 NTG and the pain stopped within 10 minutes. No change in breathing or lower ext edema. She does have a cough but denies fevers or chills.   Past Medical History:  Diagnosis Date  . Arthritis   . Asthma   . B12 deficiency   . CAD (coronary artery disease)   . COPD (chronic obstructive pulmonary disease) (HCC)   . Depression   . Epilepsy (HCC)   . GERD (gastroesophageal reflux disease)   . Hyperlipidemia   . Hypertension   . Hypoxemia   . Macular degeneration (senile) of retina   . Macular degeneration of both eyes   . MI (myocardial infarction) (HCC) 09/2003, 05/2013  . Physiological tremor   . Presence of stent of CABG    x2   . Seizures (HCC)     Past Surgical History:  Procedure Laterality Date  . ABDOMINAL HYSTERECTOMY    . APPENDECTOMY    . CARDIAC CATHETERIZATION   05/2013  . CARDIAC SURGERY    . CATARACT EXTRACTION  03/2016  . CHOLECYSTECTOMY  1962  . CORONARY ANGIOPLASTY WITH STENT PLACEMENT  Aug. 2005 and March 2015  . ROTATOR CUFF REPAIR Right   . VENTRAL HERNIA REPAIR  2002     Home Medications:  Prior to Admission medications   Medication Sig Start Date End Date Taking? Authorizing Provider  albuterol (PROVENTIL HFA;VENTOLIN HFA) 108 (90 Base) MCG/ACT inhaler Inhale 2 puffs into the lungs every 6 (six) hours as needed for wheezing or shortness of breath. 08/13/17  Yes Galen Manila, NP  albuterol (PROVENTIL) (2.5 MG/3ML) 0.083% nebulizer solution Take 3 mLs (2.5 mg total) by nebulization every 6 (six) hours as needed for Wheezing. 08/13/17  Yes Galen Manila, NP  aspirin 81 MG tablet Take 1 tablet (81 mg total) by mouth daily. 06/07/17  Yes Dionne Bucy, MD  atorvastatin (LIPITOR) 10 MG tablet Take 1 tablet (10 mg total) by mouth daily. 08/13/17  Yes Galen Manila, NP  busPIRone (BUSPAR) 10 MG tablet Take 1 tablet (10 mg total) by mouth 2 (two) times daily. 08/13/17  Yes Galen Manila, NP  carvedilol (COREG) 3.125 MG tablet Take 1 tablet (3.125 mg total) by mouth 2 (two) times daily with a meal. 08/13/17  Yes [name]  Renee, NP  Fluticasone-Salmeterol (ADVAIR) 100-50 MCG/DOSE AEPB Inhale 1 puff into the lungs 2 (two) times daily. 08/13/17  Yes Galen Manila, NP  furosemide (LASIX) 20 MG tablet Take 0.5 tablets (10 mg total) by mouth daily. 08/13/17  Yes Galen Manila, NP  gabapentin (NEURONTIN) 400 MG capsule Take 2 capsules (800 mg total) by mouth 3 (three) times daily. 08/13/17  Yes Galen Manila, NP  nitroGLYCERIN (NITROSTAT) 0.4 MG SL tablet Place 1 tablet (0.4 mg total) under the tongue every 5 (five) minutes as needed for chest pain. 06/12/17  Yes Galen Manila, NP  pantoprazole (PROTONIX) 40 MG tablet Take 1 tablet (40 mg total) by mouth daily. 08/13/17  Yes Galen Manila, NP    phenytoin (DILANTIN) 100 MG ER capsule Take 2 capsules (200 mg total) by mouth at bedtime. Take 2 by mouth at bedtime 08/13/17  Yes Galen Manila, NP  QUEtiapine (SEROQUEL) 50 MG tablet Take 1 tablet (50 mg total) by mouth at bedtime. 08/13/17  Yes Galen Manila, NP  sertraline (ZOLOFT) 100 MG tablet Take 1 tablet (100 mg total) by mouth daily. 08/13/17  Yes Galen Manila, NP  HYDROcodone-acetaminophen (NORCO) 7.5-325 MG tablet Take 1 tablet by mouth 2 (two) times daily as needed for moderate pain. For chronic pain. To fill on or after 06-13-2017, 07-13-2017, 08-11-2017 06/13/17   Edward Jolly, MD  Lidocaine 0.5 % GEL Apply 1 application topically 3 (three) times daily. 07/12/17   Enid Derry, PA-C    Inpatient Medications: Scheduled Meds: . amLODipine  5 mg Oral Daily  . aspirin EC  325 mg Oral Daily  . atorvastatin  10 mg Oral Daily  . busPIRone  10 mg Oral BID  . enoxaparin (LOVENOX) injection  40 mg Subcutaneous Q24H  . furosemide  10 mg Oral Daily  . gabapentin  800 mg Oral TID  . isosorbide mononitrate  30 mg Oral Daily  . lisinopril  5 mg Oral Daily  . mometasone-formoterol  2 puff Inhalation BID  . nicotine  14 mg Transdermal Daily  . pantoprazole  40 mg Oral Daily  . phenytoin  200 mg Oral QHS  . QUEtiapine  50 mg Oral QHS  . sertraline  100 mg Oral Daily   Continuous Infusions:  PRN Meds: acetaminophen, albuterol, ALPRAZolam, gi cocktail, hydrALAZINE, HYDROcodone-acetaminophen, morphine injection, nitroGLYCERIN, ondansetron (ZOFRAN) IV, zolpidem  Allergies:    Allergies  Allergen Reactions  . Motrin [Ibuprofen] Shortness Of Breath and Swelling    Swelling of lip/tongue  . Tolmetin Rash  . Alendronate     Other reaction(s): Nausea And Vomiting Other reaction(s): Nausea And Vomiting  . Other Rash    Any Steriods. Causes mouth sores  . Tramadol Hcl Nausea And Vomiting    Social History:   Social History   Socioeconomic History  . Marital status:  Widowed    Spouse name: Not on file  . Number of children: Not on file  . Years of education: 6  . Highest education level: Not on file  Occupational History  . Not on file  Social Needs  . Financial resource strain: Not hard at all  . Food insecurity:    Worry: Never true    Inability: Never true  . Transportation needs:    Medical: No    Non-medical: No  Tobacco Use  . Smoking status: Current Every Day Smoker    Packs/day: 1.00    Years: 50.00    Pack years: 50.00  Types: Cigarettes  . Smokeless tobacco: Never Used  . Tobacco comment: 10 a day   Substance and Sexual Activity  . Alcohol use: No  . Drug use: No  . Sexual activity: Never  Lifestyle  . Physical activity:    Days per week: 0 days    Minutes per session: 0 min  . Stress: Not at all  Relationships  . Social connections:    Talks on phone: Once a week    Gets together: Never    Attends religious service: Never    Active member of club or organization: No    Attends meetings of clubs or organizations: Never    Relationship status: Widowed  . Intimate partner violence:    Fear of current or ex partner: No    Emotionally abused: No    Physically abused: No    Forced sexual activity: No  Other Topics Concern  . Not on file  Social History Narrative  . Not on file    Family History:    Family History  Problem Relation Age of Onset  . Diabetes Mother   . Cancer Sister   . Diabetes Sister   . Heart disease Sister   . Heart attack Sister   . Hyperlipidemia Sister   . Hypertension Sister   . Cancer Brother   . Diabetes Brother   . Heart disease Brother   . Heart attack Brother   . Hyperlipidemia Brother   . Hypertension Brother   . Diabetes Brother   . Diabetes Brother   . Kidney failure Brother      ROS:  Please see the history of present illness.  All other ROS reviewed and negative.     Physical Exam/Data:   Vitals:   09/07/17 1230 09/07/17 1301 09/07/17 1400 09/07/17 1404  BP:  (!) 134/107 (!) 169/61 (!) 172/82   Pulse: (!) 53 (!) 55 61   Resp: 19 (!) 26 18   Temp:   97.9 F (36.6 C)   TempSrc:      SpO2: (!) 87% 92% 94%   Weight:    146 lb 8 oz (66.5 kg)  Height:    5\' 2"  (1.575 m)   No intake or output data in the 24 hours ending 09/07/17 1553 Filed Weights   09/07/17 0810 09/07/17 1404  Weight: 143 lb (64.9 kg) 146 lb 8 oz (66.5 kg)   Body mass index is 26.8 kg/m.  General:  Well nourished, well developed, in no acute distress HEENT: normal Lymph: no adenopathy Neck: no JVD Endocrine:  No thryomegaly Vascular: No carotid bruits; FA pulses 2+ bilaterally without bruits  Cardiac:  normal S1, S2; RRR; no murmur  Lungs:  clear to auscultation bilaterally, no wheezing, rhonchi or rales  Abd: soft, nontender, no hepatomegaly  Ext: no edema Musculoskeletal:  No deformities, BUE and BLE strength normal and equal Skin: warm and dry  Neuro:  CNs 2-12 intact, no focal abnormalities noted Psych:  Normal affect   EKG:  The EKG was personally reviewed and demonstrates:  Sinus, non-specific T wave abn Telemetry:  Telemetry was personally reviewed and demonstrates:  sinus  Relevant CV Studies:   Laboratory Data:  Chemistry Recent Labs  Lab 09/07/17 0808  NA 144  K 3.5  CL 108  CO2 30  GLUCOSE 101*  BUN 14  CREATININE 0.76  CALCIUM 8.4*  GFRNONAA >60  GFRAA >60  ANIONGAP 6    No results for input(s): PROT, ALBUMIN, AST, ALT, ALKPHOS,  BILITOT in the last 168 hours. Hematology Recent Labs  Lab 09/07/17 0808  WBC 3.8  RBC 4.34  HGB 13.4  HCT 39.8  MCV 91.5  MCH 30.8  MCHC 33.7  RDW 16.3*  PLT 209   Cardiac Enzymes Recent Labs  Lab 09/07/17 0808  TROPONINI <0.03   No results for input(s): TROPIPOC in the last 168 hours.  BNPNo results for input(s): BNP, PROBNP in the last 168 hours.  DDimer No results for input(s): DDIMER in the last 168 hours.  Radiology/Studies:  Dg Chest 2 View  Result Date: 09/07/2017 CLINICAL DATA:   Chest pain EXAM: CHEST - 2 VIEW COMPARISON:  06/03/2017 FINDINGS: Cardiac shadow is at the upper limits of normal in size but stable. Aortic calcifications are again seen. Lungs are well aerated bilaterally. Chronic scarring is noted in the left lung stable from the previous exam. Mild increased retrocardiac atelectasis is noted. IMPRESSION: Chronic scarring in the left base. Left retrocardiac atelectasis is noted. Electronically Signed   By: Alcide Clever M.D.   On: 09/07/2017 08:37    Assessment and Plan:   1. Chest pain/CAD: She has a history of CAD with prior stenting of the LAD and Circumflex. Her chest pain is now resolved. Her EKG shows no ischemic changes. Troponin is negative. Chest x-ray without any infiltrate. -Cycle troponin -EKG in am -No indication for heparin at this time as there is no evidence of ACS -Will follow in am.    For questions or updates, please contact [facility] Please consult [email or web address] for contact info under Cardiology/STEMI.   Signed, Verne Carrow, MD  09/07/2017 3:53 PM

## 2017-09-07 NOTE — ED Notes (Signed)
Pt to radiology.

## 2017-09-07 NOTE — ED Triage Notes (Signed)
Pt arrives via [facility] from [facility] c/o sudden onset of CP began when she went out to smoke this morning. 324mg  of aspirin and 1 sublingual nitro admin PTA. Pt states pain decreased post nitro admin. Physician bedside.

## 2017-09-07 NOTE — Clinical Social Work Note (Signed)
Clinical Social Work Assessment  Patient Details  Name: Sandy Daniel MRN: [id number] Date of Birth: [birth year]  Date of referral:  09/07/17               Reason for consult:  Other (Comment Required)(From [facility])                Permission sought to share information with:  Family Supports, Magazine features editor Permission granted to share information::  Yes, Release of Information Signed  Name::     Flint Melter  [phone]  Agency::  [facility]  Relationship::     Contact Information:     Housing/Transportation Living arrangements for the past 2 months:  Group Home Source of Information:  Patient Patient Interpreter Needed:  None Criminal Activity/Legal Involvement Pertinent to Current Situation/Hospitalization:  No - Comment as needed Significant Relationships:  None Lives with:  Facility Resident Do you feel safe going back to the place where you live?    Need for family participation in patient care:  No (Coment)  Care giving concerns:  None I am treated well at the group home   Social Worker assessment / plan: LCSW obtained verbal consnet to complete assessment. Patient has been a resident at [facility] for 2 1/2 months and she has no issues and will return. Patient has assistance and uses a walker. Patient id oriented x4 and  Does have some recent weakness and complains of SOB since this morning . Patient is independent with her ADLs she dresses herself and they supervise her in the bath. Feeds self  Get around by herself. Patient recently lost her son and has only her grandchildren left ( 12 of them) . Patient is to return to group home once discharged.  No further needs.  Employment status:  Retired Health and safety inspector:  Sports administrator) PT Recommendations:    Information / Referral to community resources:   None  Patient/Family's Response to care:  Good understanding  Patient/Family's Understanding of and Emotional  Response to Diagnosis, Current Treatment, and Prognosis: Good understanding of her conditions and reports she will try to quit smoking.  Emotional Assessment Appearance:  Appears younger than stated age Attitude/Demeanor/Rapport:  Charismatic Affect (typically observed):  Accepting Orientation:  Oriented to Self, Oriented to Place, Oriented to  Time, Oriented to Situation Alcohol / Substance use:  Tobacco Use Psych involvement (Current and /or in the community):  No (Comment)  Discharge Needs  Concerns to be addressed:  No discharge needs identified Readmission within the last 30 days:  No Current discharge risk:  None Barriers to Discharge:  No Barriers Identified   Cheron Schaumann, LCSW 09/07/2017, 3:34 PM

## 2017-09-07 NOTE — Care Management Obs Status (Signed)
MEDICARE OBSERVATION STATUS NOTIFICATION   Patient Details  Name: Sandy Daniel MRN: [id number] Date of Birth: 11/18/1928   Medicare Observation Status Notification Given:  Yes    Shadiyah Wernli A Fitzpatrick Alberico, RN 09/07/2017, 3:08 PM

## 2017-09-07 NOTE — ED Provider Notes (Signed)
[facility] Emergency Department Provider Note  ____________________________________________  Time seen: Approximately 8:11 AM  I have reviewed the triage vital signs and the nursing notes.   HISTORY  Chief Complaint Chest Pain   HPI Sandy Daniel is a 84 y.o. female with a history of CAD status post stents, COPD, hypertension, hyperlipidemia, smoking who presents via EMS from her group home for chest pain.  Patient reports that she was sitting outside smoking a cigarette when she developed sudden onset of severe chest pain that she describes as substernal chest pressure "an elephant sitting on my chest" nonradiating and associated with diaphoresis, nausea, and shortness of breath.  EMS was called.  Upon arrival patient was severely hypertensive and diaphoretic.  She received 1 sublingual nitro per EMS and currently reports 2 out of 10 chest pressure.  Her blood pressure has significantly improved.  Patient denies personal or family history of blood clots, recent travel immobilization, leg pain or swelling, hemoptysis.  The pain does not radiate to her back, no neuro deficits.   Past Medical History:  Diagnosis Date  . Arthritis   . Asthma   . B12 deficiency   . CAD (coronary artery disease)   . COPD (chronic obstructive pulmonary disease) (HCC)   . Depression   . Epilepsy (HCC)   . GERD (gastroesophageal reflux disease)   . Hyperlipidemia   . Hypertension   . Hypoxemia   . Macular degeneration (senile) of retina   . Macular degeneration of both eyes   . MI (myocardial infarction) (HCC) 09/2003, 05/2013  . Physiological tremor   . Presence of stent of CABG    x2   . Seizures [facility])     Patient Active Problem List   Diagnosis Date Noted  . 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 Major depressive disorder with single episode, in partial remission (HCC) 05/15/2017  . Neck pain 01/31/2017  . Chronic right shoulder pain  01/31/2017  . Chronic bilateral thoracic back pain 12/04/2016  . Muscle spasm of back 10/22/2016  . (HFpEF) heart failure with preserved ejection fraction (HCC) 09/11/2016  . Anxiety 07/16/2016  . Chronic bilateral low back pain 07/16/2016  . Unstable gait 07/16/2016  . Controlled substance agreement signed 03/27/2016  . Dysphagia 03/26/2016  . Panic disorder 03/26/2016  . CAD (coronary artery disease)   . Seizure disorder (HCC)   . Macular degeneration of both eyes   . B12 deficiency   . COPD (chronic obstructive pulmonary disease) (HCC)   . HTN (hypertension)   . Hyperlipidemia   . Arthritis   . Depression   . GERD (gastroesophageal reflux disease)   . Physiological tremor 02/18/2014    Past Surgical History:  Procedure Laterality Date  . ABDOMINAL HYSTERECTOMY    . APPENDECTOMY    . CARDIAC CATHETERIZATION  05/2013  . CARDIAC SURGERY    . CATARACT EXTRACTION  03/2016  . CHOLECYSTECTOMY  1962  . CORONARY ANGIOPLASTY WITH STENT PLACEMENT  Aug. 2005 and March 2015  . ROTATOR CUFF REPAIR Right   . VENTRAL HERNIA REPAIR  2002    Prior to Admission medications   Medication Sig Start Date End Date Taking? Authorizing Provider  albuterol (PROVENTIL HFA;VENTOLIN HFA) 108 (90 Base) MCG/ACT inhaler Inhale 2 puffs into the lungs every 6 (six) hours as needed for wheezing or shortness of breath. 08/13/17  Yes Galen Manila, NP  aspirin 81 MG tablet Take 1 tablet (81 mg total) by mouth daily.  06/07/17  Yes Dionne Bucy, MD  atorvastatin (LIPITOR) 10 MG tablet Take 1 tablet (10 mg total) by mouth daily. 08/13/17  Yes Galen Manila, NP  busPIRone (BUSPAR) 10 MG tablet Take 1 tablet (10 mg total) by mouth 2 (two) times daily. 08/13/17  Yes Galen Manila, NP  carvedilol (COREG) 3.125 MG tablet Take 1 tablet (3.125 mg total) by mouth 2 (two) times daily with a meal. 08/13/17  Yes Galen Manila, NP  Fluticasone-Salmeterol (ADVAIR) 100-50 MCG/DOSE AEPB Inhale 1  puff into the lungs 2 (two) times daily. 08/13/17  Yes Galen Manila, NP  furosemide (LASIX) 20 MG tablet Take 0.5 tablets (10 mg total) by mouth daily. 08/13/17  Yes Galen Manila, NP  gabapentin (NEURONTIN) 400 MG capsule Take 2 capsules (800 mg total) by mouth 3 (three) times daily. 08/13/17  Yes Galen Manila, NP  pantoprazole (PROTONIX) 40 MG tablet Take 1 tablet (40 mg total) by mouth daily. 08/13/17  Yes Galen Manila, NP  albuterol (PROVENTIL) (2.5 MG/3ML) 0.083% nebulizer solution Take 3 mLs (2.5 mg total) by nebulization every 6 (six) hours as needed for Wheezing. 08/13/17   Galen Manila, NP  HYDROcodone-acetaminophen (NORCO) 7.5-325 MG tablet Take 1 tablet by mouth 2 (two) times daily as needed for moderate pain. For chronic pain. To fill on or after 06-13-2017, 07-13-2017, 08-11-2017 06/13/17   Edward Jolly, MD  Lidocaine 0.5 % GEL Apply 1 application topically 3 (three) times daily. 07/12/17   Enid Derry, PA-C  nitroGLYCERIN (NITROSTAT) 0.4 MG SL tablet Place 1 tablet (0.4 mg total) under the tongue every 5 (five) minutes as needed for chest pain. 06/12/17   Galen Manila, NP  phenytoin (DILANTIN) 100 MG ER capsule Take 2 capsules (200 mg total) by mouth at bedtime. Take 2 by mouth at bedtime 08/13/17   Galen Manila, NP  QUEtiapine (SEROQUEL) 50 MG tablet Take 1 tablet (50 mg total) by mouth at bedtime. 08/13/17   Galen Manila, NP  sertraline (ZOLOFT) 100 MG tablet Take 1 tablet (100 mg total) by mouth daily. 08/13/17   Galen Manila, NP    Allergies Motrin [ibuprofen]; Tolmetin; Alendronate; Other; and Tramadol hcl  Family History  Problem Relation Age of Onset  . Diabetes Mother   . Cancer Sister   . Diabetes Sister   . Heart disease Sister   . Heart attack Sister   . Hyperlipidemia Sister   . Hypertension Sister   . Cancer Brother   . Diabetes Brother   . Heart disease Brother   . Heart attack Brother   .  Hyperlipidemia Brother   . Hypertension Brother   . Diabetes Brother   . Diabetes Brother   . Kidney failure Brother     Social History Social History   Tobacco Use  . Smoking status: Current Every Day Smoker    Packs/day: 1.00    Years: 50.00    Pack years: 50.00    Types: Cigarettes  . Smokeless tobacco: Never Used  . Tobacco comment: 10 a day   Substance Use Topics  . Alcohol use: No  . Drug use: No    Review of Systems  Constitutional: Negative for fever. + dizziness Eyes: Negative for visual changes. ENT: Negative for sore throat. Neck: No neck pain  Cardiovascular: + chest pain, diaphoresis Respiratory: Negative for shortness of breath. Gastrointestinal: Negative for abdominal pain, vomiting or diarrhea. + nausea Genitourinary: Negative for dysuria. Musculoskeletal: Negative for back  pain. Skin: Negative for rash. Neurological: Negative for headaches, weakness or numbness. Psych: No SI or HI  ____________________________________________   PHYSICAL EXAM:  VITAL SIGNS: ED Triage Vitals  Enc Vitals Group     BP 09/07/17 0805 (!) 156/70     Pulse Rate 09/07/17 0805 (!) 54     Resp 09/07/17 0805 15     Temp 09/07/17 0810 98.4 F (36.9 C)     Temp Source 09/07/17 0810 Oral     SpO2 09/07/17 0805 94 %     Weight 09/07/17 0810 143 lb (64.9 kg)     Height 09/07/17 0810 5\' 2"  (1.575 m)     Head Circumference --      Peak Flow --      Pain Score --      Pain Loc --      Pain Edu? --      Excl. in GC? --     Constitutional: Alert and oriented. Well appearing and in no apparent distress. HEENT:      Head: Normocephalic and atraumatic.         Eyes: Conjunctivae are normal. Sclera is non-icteric.       Mouth/Throat: Mucous membranes are moist.       Neck: Supple with no signs of meningismus. Cardiovascular: Bradycardic with regular rhythm. No murmurs, gallops, or rubs. 2+ symmetrical distal pulses are present in all extremities. No JVD. Respiratory:  Normal respiratory effort. Lungs are clear to auscultation bilaterally. No wheezes, crackles, or rhonchi.  Gastrointestinal: Soft, non tender, and non distended with positive bowel sounds. No rebound or guarding. Musculoskeletal: Trace pitting edema bilateral  Neurologic: Normal speech and language. Face is symmetric. Moving all extremities. No gross focal neurologic deficits are appreciated. Skin: Skin is warm, dry and intact. No rash noted. Psychiatric: Mood and affect are normal. Speech and behavior are normal.  ____________________________________________   LABS (all labs ordered are listed, but only abnormal results are displayed)  Labs Reviewed  CBC - Abnormal; Notable for the following components:      Result Value   RDW 16.3 (*)    All other components within normal limits  BASIC METABOLIC PANEL - Abnormal; Notable for the following components:   Glucose, Bld 101 (*)    Calcium 8.4 (*)    All other components within normal limits  TROPONIN I  TROPONIN I   ____________________________________________  EKG  ED ECG REPORT I, Nita Sickle, the attending physician, personally viewed and interpreted this ECG.  Sinus bradycardia, rate 54, 1st degree AV block, normal QTC, left axis deviation, no ST elevations or depressions. Unchanged from prior ____________________________________________  RADIOLOGY  I have personally reviewed the images performed during this visit and I agree with the Radiologist's read.   Interpretation by Radiologist:  Dg Chest 2 View  Result Date: 09/07/2017 CLINICAL DATA:  Chest pain EXAM: CHEST - 2 VIEW COMPARISON:  06/03/2017 FINDINGS: Cardiac shadow is at the upper limits of normal in size but stable. Aortic calcifications are again seen. Lungs are well aerated bilaterally. Chronic scarring is noted in the left lung stable from the previous exam. Mild increased retrocardiac atelectasis is noted. IMPRESSION: Chronic scarring in the left base.  Left retrocardiac atelectasis is noted. Electronically Signed   By: Alcide Clever M.D.   On: 09/07/2017 08:37      ____________________________________________   PROCEDURES  Procedure(s) performed: None Procedures Critical Care performed:  None ____________________________________________   INITIAL IMPRESSION / ASSESSMENT AND PLAN / ED COURSE  84 y.o. female with a history of CAD status post stents, COPD, hypertension, hyperlipidemia, smoking who presents via EMS from her group home for chest pain. Presentation concerning for ACS with substernal chest pressure, diaphoresis, nausea, and SOB. Patient given nitro with resolution of the pain. EKG showing no evidence of STEMI. Patient is pain free. Labs pending. ASA given per EMS. Anticipate admission.     _________________________ 9:26 AM on 09/07/2017 -----------------------------------------  Patient remains pain-free.  First troponin is negative.  At this time presentation concerning for unstable angina.  Will admit to the hospitalist service.    As part of my medical decision making, I reviewed the following data within the electronic MEDICAL RECORD NUMBER Nursing notes reviewed and incorporated, Labs reviewed , EKG interpreted , Old EKG reviewed, Old chart reviewed, Radiograph reviewed , Discussed with admitting physician , Notes from prior ED visits and [place] Controlled Substance Database    Pertinent labs & imaging results that were available during my care of the patient were reviewed by me and considered in my medical decision making (see chart for details).    ____________________________________________   FINAL CLINICAL IMPRESSION(S) / ED DIAGNOSES  Final diagnoses:  Unstable angina (HCC)      NEW MEDICATIONS STARTED DURING THIS VISIT:  ED Discharge Orders    None       Note:  This document was prepared using Dragon voice recognition software and may include unintentional dictation errors.    Don Perking, [place],  MD 09/07/17 [phone]

## 2017-09-07 NOTE — Progress Notes (Signed)
Advanced Care Plan.  Purpose of Encounter: CODE STATUS. Parties in Attendance: The patient and me. Patient's Decisional Capacity: Yes. Medical Story: Sandy Daniel  is a 84 y.o. female with a known history of CAD status post stents, COPD, hypertension, hyperlipidemia, tobacco abuse, GERD and depression.    She is being admitted for chest pain, rule out ACS.  I discussed the patient current condition, prognosis and CODE STATUS.  The patient wants to try once if she has cardiopulmonary arrest, but does not want to be on ventilation long time.  Plan:  Code Status: Full code. Time spent discussing advance care planning: 17 minutes.

## 2017-09-07 NOTE — H&P (Addendum)
[employer] [facility] [place] [facility]   PATIENT NAME: Sandy Daniel    MR#:  [id number]  DATE OF BIRTH:  04-15-1928  DATE OF ADMISSION:  09/07/2017  PRIMARY CARE PHYSICIAN: Galen Manila, NP   REQUESTING/REFERRING PHYSICIAN: Dr.Veronese.  CHIEF COMPLAINT:   Chief Complaint  Patient presents with  . Chest Pain   Chest pain this morning. HISTORY OF PRESENT ILLNESS:  Sandy Daniel  is a 84 y.o. female with a known history of CAD status post stents, COPD, hypertension, hyperlipidemia, tobacco abuse, GERD and depression.  The patient was sent from group home by EMS due to chest pain today.  She said the chest pain is in substernal area, heavy with left shoulder pain, nausea and diaphoresis.  She was given nitroglycerin by EMS.  Chest pain decreased to 10 out of 10.  The patient denies any other symptoms.  The first troponin level is negative.  Chest x-ray is unremarkable.  EKG is unremarkable.  ED physician request admission. PAST MEDICAL HISTORY:   Past Medical History:  Diagnosis Date  . Arthritis   . Asthma   . B12 deficiency   . CAD (coronary artery disease)   . COPD (chronic obstructive pulmonary disease) (HCC)   . Depression   . Epilepsy (HCC)   . GERD (gastroesophageal reflux disease)   . Hyperlipidemia   . Hypertension   . Hypoxemia   . Macular degeneration (senile) of retina   . Macular degeneration of both eyes   . MI (myocardial infarction) (HCC) 09/2003, 05/2013  . Physiological tremor   . Presence of stent of CABG    x2   . Seizures (HCC)     PAST SURGICAL HISTORY:   Past Surgical History:  Procedure Laterality Date  . ABDOMINAL HYSTERECTOMY    . APPENDECTOMY    . CARDIAC CATHETERIZATION  05/2013  . CARDIAC SURGERY    . CATARACT EXTRACTION  03/2016  . CHOLECYSTECTOMY  1962  . CORONARY ANGIOPLASTY WITH STENT PLACEMENT  Aug. 2005 and March 2015  . ROTATOR CUFF REPAIR Right   . VENTRAL HERNIA REPAIR  2002    SOCIAL  HISTORY:   Social History   Tobacco Use  . Smoking status: Current Every Day Smoker    Packs/day: 1.00    Years: 50.00    Pack years: 50.00    Types: Cigarettes  . Smokeless tobacco: Never Used  . Tobacco comment: 10 a day   Substance Use Topics  . Alcohol use: No    FAMILY HISTORY:   Family History  Problem Relation Age of Onset  . Diabetes Mother   . Cancer Sister   . Diabetes Sister   . Heart disease Sister   . Heart attack Sister   . Hyperlipidemia Sister   . Hypertension Sister   . Cancer Brother   . Diabetes Brother   . Heart disease Brother   . Heart attack Brother   . Hyperlipidemia Brother   . Hypertension Brother   . Diabetes Brother   . Diabetes Brother   . Kidney failure Brother     DRUG ALLERGIES:   Allergies  Allergen Reactions  . Motrin [Ibuprofen] Shortness Of Breath and Swelling    Swelling of lip/tongue  . Tolmetin Rash  . Alendronate     Other reaction(s): Nausea And Vomiting Other reaction(s): Nausea And Vomiting  . Other Rash    Any Steriods. Causes mouth sores  . Tramadol Hcl Nausea And Vomiting    REVIEW  OF SYSTEMS:   Review of Systems  Constitutional: Positive for diaphoresis. Negative for chills, fever and malaise/fatigue.  HENT: Negative for sore throat.   Eyes: Negative for blurred vision and double vision.  Respiratory: Positive for cough. Negative for hemoptysis, shortness of breath, wheezing and stridor.   Cardiovascular: Positive for chest pain. Negative for palpitations, orthopnea and leg swelling.  Gastrointestinal: Positive for nausea. Negative for abdominal pain, blood in stool, diarrhea, melena and vomiting.  Genitourinary: Negative for dysuria, flank pain and hematuria.  Musculoskeletal: Negative for back pain and joint pain.  Skin: Negative for rash.  Neurological: Positive for headaches. Negative for dizziness, sensory change, focal weakness, seizures, loss of consciousness and weakness.  Endo/Heme/Allergies:  Negative for polydipsia.  Psychiatric/Behavioral: Negative for depression. The patient is not nervous/anxious.     MEDICATIONS AT HOME:   Prior to Admission medications   Medication Sig Start Date End Date Taking? Authorizing Provider  albuterol (PROVENTIL HFA;VENTOLIN HFA) 108 (90 Base) MCG/ACT inhaler Inhale 2 puffs into the lungs every 6 (six) hours as needed for wheezing or shortness of breath. 08/13/17  Yes Galen Manila, NP  albuterol (PROVENTIL) (2.5 MG/3ML) 0.083% nebulizer solution Take 3 mLs (2.5 mg total) by nebulization every 6 (six) hours as needed for Wheezing. 08/13/17  Yes Galen Manila, NP  aspirin 81 MG tablet Take 1 tablet (81 mg total) by mouth daily. 06/07/17  Yes Dionne Bucy, MD  atorvastatin (LIPITOR) 10 MG tablet Take 1 tablet (10 mg total) by mouth daily. 08/13/17  Yes Galen Manila, NP  busPIRone (BUSPAR) 10 MG tablet Take 1 tablet (10 mg total) by mouth 2 (two) times daily. 08/13/17  Yes Galen Manila, NP  carvedilol (COREG) 3.125 MG tablet Take 1 tablet (3.125 mg total) by mouth 2 (two) times daily with a meal. 08/13/17  Yes Galen Manila, NP  Fluticasone-Salmeterol (ADVAIR) 100-50 MCG/DOSE AEPB Inhale 1 puff into the lungs 2 (two) times daily. 08/13/17  Yes Galen Manila, NP  furosemide (LASIX) 20 MG tablet Take 0.5 tablets (10 mg total) by mouth daily. 08/13/17  Yes Galen Manila, NP  gabapentin (NEURONTIN) 400 MG capsule Take 2 capsules (800 mg total) by mouth 3 (three) times daily. 08/13/17  Yes Galen Manila, NP  nitroGLYCERIN (NITROSTAT) 0.4 MG SL tablet Place 1 tablet (0.4 mg total) under the tongue every 5 (five) minutes as needed for chest pain. 06/12/17  Yes Galen Manila, NP  pantoprazole (PROTONIX) 40 MG tablet Take 1 tablet (40 mg total) by mouth daily. 08/13/17  Yes Galen Manila, NP  phenytoin (DILANTIN) 100 MG ER capsule Take 2 capsules (200 mg total) by mouth at bedtime. Take 2 by mouth  at bedtime 08/13/17  Yes Galen Manila, NP  QUEtiapine (SEROQUEL) 50 MG tablet Take 1 tablet (50 mg total) by mouth at bedtime. 08/13/17  Yes Galen Manila, NP  sertraline (ZOLOFT) 100 MG tablet Take 1 tablet (100 mg total) by mouth daily. 08/13/17  Yes Galen Manila, NP  HYDROcodone-acetaminophen (NORCO) 7.5-325 MG tablet Take 1 tablet by mouth 2 (two) times daily as needed for moderate pain. For chronic pain. To fill on or after 06-13-2017, 07-13-2017, 08-11-2017 06/13/17   Edward Jolly, MD  Lidocaine 0.5 % GEL Apply 1 application topically 3 (three) times daily. 07/12/17   Enid Derry, PA-C      VITAL SIGNS:  Blood pressure (!) 156/70, pulse (!) 54, temperature 98.4 F (36.9 C), temperature source Oral, resp.  rate 14, height 5\' 2"  (1.575 m), weight 143 lb (64.9 kg), SpO2 94 %.  PHYSICAL EXAMINATION:  Physical Exam  GENERAL:  84 y.o.-year-old patient lying in the bed with no acute distress.  EYES: Pupils equal, round, reactive to light and accommodation. No scleral icterus. Extraocular muscles intact.  HEENT: Head atraumatic, normocephalic. Oropharynx and nasopharynx clear.  NECK:  Supple, no jugular venous distention. No thyroid enlargement, no tenderness.  LUNGS: Normal breath sounds bilaterally, no wheezing, rales,rhonchi or crepitation. No use of accessory muscles of respiration.  CARDIOVASCULAR: S1, S2 normal. No murmurs, rubs, or gallops.  ABDOMEN: Soft, nontender, nondistended. Bowel sounds present. No organomegaly or mass.  EXTREMITIES: No pedal edema, cyanosis, or clubbing.  NEUROLOGIC: Cranial nerves II through XII are intact. Muscle strength 5/5 in all extremities. Sensation intact. Gait not checked.  PSYCHIATRIC: The patient is alert and oriented x 3.  SKIN: No obvious rash, lesion, or ulcer.   LABORATORY PANEL:   CBC Recent Labs  Lab 09/07/17 0808  WBC 3.8  HGB 13.4  HCT 39.8  PLT 209    ------------------------------------------------------------------------------------------------------------------  Chemistries  Recent Labs  Lab 09/07/17 0808  NA 144  K 3.5  CL 108  CO2 30  GLUCOSE 101*  BUN 14  CREATININE 0.76  CALCIUM 8.4*   ------------------------------------------------------------------------------------------------------------------  Cardiac Enzymes Recent Labs  Lab 09/07/17 0808  TROPONINI <0.03   ------------------------------------------------------------------------------------------------------------------  RADIOLOGY:  Dg Chest 2 View  Result Date: 09/07/2017 CLINICAL DATA:  Chest pain EXAM: CHEST - 2 VIEW COMPARISON:  06/03/2017 FINDINGS: Cardiac shadow is at the upper limits of normal in size but stable. Aortic calcifications are again seen. Lungs are well aerated bilaterally. Chronic scarring is noted in the left lung stable from the previous exam. Mild increased retrocardiac atelectasis is noted. IMPRESSION: Chronic scarring in the left base. Left retrocardiac atelectasis is noted. Electronically Signed   By: Alcide Clever M.D.   On: 09/07/2017 08:37      IMPRESSION AND PLAN:   Chest pain, rule out ACS.  History of CAD, status post stent. The patient will be placed for observation. Telemetry monitor, follow-up with troponin, give aspirin and Lipitor, cardiology consult.  Bradycardia.  Hold Coreg for now.  Hypertension.  Hold Coreg due to bradycardia.  Start lisinopril and imdur.  IV hydralazine PRN.  COPD.  Stable.  Continue DuoNeb as needed and Dulera.  Tobacco abuse.  Smoking cessation was consulted for 4 minutes, nicotine patch.  All the records are reviewed and case discussed with ED provider. Management plans discussed with the patient, family and they are in agreement.  CODE STATUS: Full code  TOTAL TIME TAKING CARE OF THIS PATIENT: 36 minutes.    Shaune Pollack M.D on 09/07/2017 at 10:33 AM  Between 7am to 6pm - Pager -  [phone]  After 6pm go to [email or web address] - password [email or web address] [facility]  [facility]  Office  [phone]  CC: Primary care physician; Galen Manila, NP   Note: This dictation was prepared with Dragon dictation along with smaller phrase technology. Any transcriptional errors that result from this process are unin

## 2017-09-08 DIAGNOSIS — R072 Precordial pain: Secondary | ICD-10-CM | POA: Diagnosis not present

## 2017-09-08 LAB — LIPID PANEL
CHOLESTEROL: 126 mg/dL (ref 0–200)
HDL: 45 mg/dL (ref >40)
LDL Cholesterol: 68 mg/dL (ref 0–99)
TRIGLYCERIDES: 63 mg/dL (ref <150)
Total CHOL/HDL Ratio: 2.8 RATIO
VLDL: 13 mg/dL (ref 0–40)

## 2017-09-08 MED ORDER — MAGNESIUM HYDROXIDE 400 MG/5ML PO SUSP: 30.0000 mL | Freq: Every day | ORAL | 3 refills | Status: DC | PRN

## 2017-09-08 MED ORDER — GUAIFENESIN-DM 100-10 MG/5ML PO SYRP
15.0000 mL | ORAL_SOLUTION | Freq: Four times a day (QID) | ORAL | Status: DC | PRN
  Administered 2017-09-08: 15 mL via ORAL
  Filled 2017-09-08: qty 15

## 2017-09-08 MED ORDER — GUAIFENESIN-DM 100-10 MG/5ML PO SYRP: 15.0000 mL | ORAL_SOLUTION | Freq: Four times a day (QID) | ORAL | 0 refills | Status: DC | PRN

## 2017-09-08 NOTE — Discharge Summary (Addendum)
[employer] [facility] [place] [facility], 84 y.o., DOB 06-Sep-1928, MRN [id number]. Admission date: 09/07/2017 Discharge Date 09/08/2017 Primary MD Galen Manila, NP Admitting Physician Shaune Pollack, MD  Admission Diagnosis  Unstable angina [facility]) [I20.0]  Discharge Diagnosis   Active Problems: Atypical chest pain noncardiac Sinus bradycardia Essential hypertension COPD without exasperation Coronary artery Tobacco abuse   Hospital Course  Patient is 84 year old with known history of coronary artery disease status post stents, COPD, hypertension hyperlipidemia tobacco abuse and GERD presented with chest pain in the substernal region.  Patient's EKG and cardiac enzymes were nonrevealing.  She was seen by cardiology and recommended no further intervention at this point.  Patient's chest pain is now resolved.             Consults  cardiology  Significant Tests:  See full reports for all details     Dg Chest 2 View  Result Date: 09/07/2017 CLINICAL DATA:  Chest pain EXAM: CHEST - 2 VIEW COMPARISON:  06/03/2017 FINDINGS: Cardiac shadow is at the upper limits of normal in size but stable. Aortic calcifications are again seen. Lungs are well aerated bilaterally. Chronic scarring is noted in the left lung stable from the previous exam. Mild increased retrocardiac atelectasis is noted. IMPRESSION: Chronic scarring in the left base. Left retrocardiac atelectasis is noted. Electronically Signed   By: Alcide Clever M.D.   On: 09/07/2017 08:37       Today   Subjective:   Sandy Daniel   Pt doing better chest pain  Objective:   Blood pressure (!) 133/58, pulse (!) 53, temperature 97.6 F (36.4 C), temperature source Oral, resp. rate 18, height 5\' 2"  (1.575 m), weight 66.5 kg (146 lb 9.6 oz), SpO2 90 %.  .  Intake/Output Summary (Last 24 hours) at 09/08/2017 1459 Last data filed at 09/08/2017 1034 Gross per 24 hour  Intake 240 ml  Output 700 ml   Net -460 ml    Exam VITAL SIGNS: Blood pressure (!) 133/58, pulse (!) 53, temperature 97.6 F (36.4 C), temperature source Oral, resp. rate 18, height 5\' 2"  (1.575 m), weight 66.5 kg (146 lb 9.6 oz), SpO2 90 %.  GENERAL:  84 y.o.-year-old patient lying in the bed with no acute distress.  EYES: Pupils equal, round, reactive to light and accommodation. No scleral icterus. Extraocular muscles intact.  HEENT: Head atraumatic, normocephalic. Oropharynx and nasopharynx clear.  NECK:  Supple, no jugular venous distention. No thyroid enlargement, no tenderness.  LUNGS: Normal breath sounds bilaterally, no wheezing, rales,rhonchi or crepitation. No use of accessory muscles of respiration.  CARDIOVASCULAR: S1, S2 normal. No murmurs, rubs, or gallops.  ABDOMEN: Soft, nontender, nondistended. Bowel sounds present. No organomegaly or mass.  EXTREMITIES: No pedal edema, cyanosis, or clubbing.  NEUROLOGIC: Cranial nerves II through XII are intact. Muscle strength 5/5 in all extremities. Sensation intact. Gait not checked.  PSYCHIATRIC: The patient is alert and oriented x 3.  SKIN: No obvious rash, lesion, or ulcer.   Data Review     CBC w Diff:  Lab Results  Component Value Date   WBC 3.8 09/07/2017   HGB 13.4 09/07/2017   HGB 12.3 03/26/2016   HCT 39.8 09/07/2017   HCT 38.3 03/26/2016   PLT 209 09/07/2017   PLT 325 03/26/2016   LYMPHOPCT 37 08/06/2017   MONOPCT 8.5 08/13/2017   EOSPCT 1.3 08/13/2017   BASOPCT 0.6 08/13/2017   CMP:  Lab Results  Component Value Date   NA  144 09/07/2017   NA 142 03/26/2016   K 3.5 09/07/2017   CL 108 09/07/2017   CO2 30 09/07/2017   BUN 14 09/07/2017   BUN 17 03/26/2016   CREATININE 0.76 09/07/2017   CREATININE 0.86 08/13/2017   PROT 6.5 08/13/2017   PROT 6.7 03/26/2016   ALBUMIN 3.3 (L) 08/06/2017   ALBUMIN 3.8 03/26/2016   BILITOT 0.4 08/13/2017   BILITOT 0.3 03/26/2016   ALKPHOS 133 (H) 08/06/2017   AST 49 (H) 08/13/2017   ALT 26  08/13/2017  .  Micro Results No results found for this or any previous visit (from the past 240 hour(s)).      Code Status Orders  (From admission, onward)        Start     Ordered   09/07/17 1355  Full code  Continuous     09/07/17 1354    Code Status History    Date Active Date Inactive Code Status Order ID Comments User Context   07/11/2016 1416 07/13/2016 1824 Full Code [id number]  Milagros Loll, MD ED          Follow-up Information    Galen Manila, NP In 6 days.   Specialty:  Nurse Practitioner Why:  Appointment Time: Pt will have to make own appointment as she is D/C over weekend. Contact information: [address] [place] [address] [phone]           Discharge Medications   Allergies as of 09/08/2017      Reactions   Motrin [ibuprofen] Shortness Of Breath, Swelling   Swelling of lip/tongue   Tolmetin Rash   Alendronate    Other reaction(s): Nausea And Vomiting Other reaction(s): Nausea And Vomiting   Other Rash   Any Steriods. Causes mouth sores   Tramadol Hcl Nausea And Vomiting      Medication List    STOP taking these medications   carvedilol 3.125 MG tablet Commonly known as:  COREG     TAKE these medications   albuterol 108 (90 Base) MCG/ACT inhaler Commonly known as:  PROVENTIL HFA;VENTOLIN HFA Inhale 2 puffs into the lungs every 6 (six) hours as needed for wheezing or shortness of breath.   albuterol (2.5 MG/3ML) 0.083% nebulizer solution Commonly known as:  PROVENTIL Take 3 mLs (2.5 mg total) by nebulization every 6 (six) hours as needed for Wheezing.   aspirin 81 MG tablet Take 1 tablet (81 mg total) by mouth daily.   atorvastatin 10 MG tablet Commonly known as:  LIPITOR Take 1 tablet (10 mg total) by mouth daily.   busPIRone 10 MG tablet Commonly known as:  BUSPAR Take 1 tablet (10 mg total) by mouth 2 (two) times daily.   Fluticasone-Salmeterol 100-50 MCG/DOSE Aepb Commonly known as:  ADVAIR Inhale 1  puff into the lungs 2 (two) times daily.   furosemide 20 MG tablet Commonly known as:  LASIX Take 0.5 tablets (10 mg total) by mouth daily.   gabapentin 400 MG capsule Commonly known as:  NEURONTIN Take 2 capsules (800 mg total) by mouth 3 (three) times daily.   guaiFENesin-dextromethorphan 100-10 MG/5ML syrup Commonly known as:  ROBITUSSIN DM Take 15 mLs by mouth every 6 (six) hours as needed for cough. Notes to patient:  Took at 9:44 am   HYDROcodone-acetaminophen 7.5-325 MG tablet Commonly known as:  NORCO Take 1 tablet by mouth 2 (two) times daily as needed for moderate pain. For chronic pain. To fill on or after 06-13-2017, 07-13-2017, 08-11-2017  Lidocaine 0.5 % Gel Apply 1 application topically 3 (three) times daily.   magnesium hydroxide 400 MG/5ML suspension Commonly known as:  MILK OF MAGNESIA Take 30 mLs by mouth daily as needed for mild constipation.   nitroGLYCERIN 0.4 MG SL tablet Commonly known as:  NITROSTAT Place 1 tablet (0.4 mg total) under the tongue every 5 (five) minutes as needed for chest pain.   pantoprazole 40 MG tablet Commonly known as:  PROTONIX Take 1 tablet (40 mg total) by mouth daily.   phenytoin 100 MG ER capsule Commonly known as:  DILANTIN Take 2 capsules (200 mg total) by mouth at bedtime. Take 2 by mouth at bedtime   QUEtiapine 50 MG tablet Commonly known as:  SEROQUEL Take 1 tablet (50 mg total) by mouth at bedtime.   sertraline 100 MG tablet Commonly known as:  ZOLOFT Take 1 tablet (100 mg total) by mouth daily.          Total Time in preparing paper work, data evaluation and todays exam - 35 minutes  Auburn Bilberry M.D on 09/08/2017 at 2:59 PM [employer] [facility]   Office  [phone]

## 2017-09-08 NOTE — Clinical Social Work Note (Addendum)
CSW attempted to contact Jasmine December, Production designer, theatre/television/film at [facility] family care home, to inform her that patient is ready for discharge today.  CSW left message awaiting for call back to find out when group home will be able to pick patient up.  CSW to continue to follow patient's progress throughout discharge planning.  10:51am  CSW spoke to [place] at [facility] family care home, and she said they should be able to pick patient up later this afternoon, she will call CSW after lunch to determine a time.  CSW to facilitate discharge planning to family care home.  11:15am  CSW received phone call from Jasmine December [phone], administrator at family care home, she said she can pick patient up around 1:30pm after church.  CSW notified bedside nurse who is aware that patient will be transported by facility.  Packet with FL2, discharge summary, are prepared for patient.  Ervin Knack. Mayan Dolney, MSW, Theresia Majors [phone]  09/08/2017 10:46 AM

## 2017-09-08 NOTE — Progress Notes (Signed)
Progress Note  Patient Name: Hilari Wethington Date of Encounter: 09/08/2017  Primary Cardiologist: No primary care provider on file. [facility] (Former pt of [facility])  Subjective   No further chest pain.   Inpatient Medications    Scheduled Meds: . amLODipine  5 mg Oral Daily  . aspirin EC  325 mg Oral Daily  . atorvastatin  10 mg Oral Daily  . busPIRone  10 mg Oral BID  . enoxaparin (LOVENOX) injection  40 mg Subcutaneous Q24H  . furosemide  10 mg Oral Daily  . gabapentin  800 mg Oral TID  . isosorbide mononitrate  30 mg Oral Daily  . lisinopril  5 mg Oral Daily  . magnesium hydroxide  30 mL Oral BID  . mometasone-formoterol  2 puff Inhalation BID  . nicotine  14 mg Transdermal Daily  . pantoprazole  40 mg Oral Daily  . phenytoin  200 mg Oral QHS  . QUEtiapine  50 mg Oral QHS  . sertraline  100 mg Oral Daily   Continuous Infusions:  PRN Meds: acetaminophen, albuterol, ALPRAZolam, gi cocktail, guaiFENesin-dextromethorphan, hydrALAZINE, HYDROcodone-acetaminophen, morphine injection, nitroGLYCERIN, ondansetron (ZOFRAN) IV, zolpidem   Vital Signs    Vitals:   09/07/17 1719 09/07/17 1950 09/08/17 0547 09/08/17 0723  BP: (!) 155/65 (!) 155/66 (!) 111/54 (!) 133/58  Pulse: (!) 48 (!) 54 (!) 49 (!) 53  Resp: 18 19 18 18   Temp: 98.1 F (36.7 C) 98.8 F (37.1 C) 98 F (36.7 C) 97.6 F (36.4 C)  TempSrc: Oral Oral  Oral  SpO2: 92% 95% 93% 90%  Weight:   146 lb 9.6 oz (66.5 kg)   Height:        Intake/Output Summary (Last 24 hours) at 09/08/2017 1028 Last data filed at 09/07/2017 2100 Gross per 24 hour  Intake -  Output 200 ml  Net -200 ml   Filed Weights   09/07/17 0810 09/07/17 1404 09/08/17 0547  Weight: 143 lb (64.9 kg) 146 lb 8 oz (66.5 kg) 146 lb 9.6 oz (66.5 kg)    Telemetry    Sinus brady - Personally Reviewed  ECG    No AM EKG - Personally Reviewed  Physical Exam   GEN: No acute distress.   Neck: No JVD Cardiac: RRR, no murmurs, rubs, or  gallops.  Respiratory: Clear to auscultation bilaterally. GI: Soft, nontender, non-distended  MS: No edema; No deformity. Neuro:  Nonfocal  Psych: Normal affect   Labs    Chemistry Recent Labs  Lab 09/07/17 0808  NA 144  K 3.5  CL 108  CO2 30  GLUCOSE 101*  BUN 14  CREATININE 0.76  CALCIUM 8.4*  GFRNONAA >60  GFRAA >60  ANIONGAP 6     Hematology Recent Labs  Lab 09/07/17 0808  WBC 3.8  RBC 4.34  HGB 13.4  HCT 39.8  MCV 91.5  MCH 30.8  MCHC 33.7  RDW 16.3*  PLT 209    Cardiac Enzymes Recent Labs  Lab 09/07/17 0808 09/07/17 1355 09/07/17 1709  TROPONINI <0.03 <0.03 <0.03   No results for input(s): TROPIPOC in the last 168 hours.   BNPNo results for input(s): BNP, PROBNP in the last 168 hours.   DDimer No results for input(s): DDIMER in the last 168 hours.   Radiology    Dg Chest 2 View  Result Date: 09/07/2017 CLINICAL DATA:  Chest pain EXAM: CHEST - 2 VIEW COMPARISON:  06/03/2017 FINDINGS: Cardiac shadow is at the upper limits of normal in size  but stable. Aortic calcifications are again seen. Lungs are well aerated bilaterally. Chronic scarring is noted in the left lung stable from the previous exam. Mild increased retrocardiac atelectasis is noted. IMPRESSION: Chronic scarring in the left base. Left retrocardiac atelectasis is noted. Electronically Signed   By: Alcide Clever M.D.   On: 09/07/2017 08:37    Cardiac Studies     Patient Profile     84 y.o. female with a history of CAD, HTN, HLD, GERD, depression and COPD admitted today with chest pain. She has a history of CAD with prior stent placement in the LAD and Circumflex. Last cath reported in the cardiology office notes was in 2017 or 2018 and at that time her stents were patent in the LAD and Circumflex and there was minimal disease in the RCA. That cath was performed in the setting of chest pain.   Assessment & Plan    1. CAD 2. Chest pain  She has ruled out for an MI. Chest pain  resolved. No plans for further workup. OK to discharge home. I will arrange cardiac follow up. She will be called by our office.        For questions or updates, please contact [facility] Please consult [email or web address] for contact info under Cardiology/STEMI.      Signed, Verne Carrow, MD  09/08/2017, 10:28 AM

## 2017-09-08 NOTE — Progress Notes (Signed)
Sandy Daniel to be D/C'd Group home per MD order.  Discussed prescriptions and follow up appointments with the patient. Prescription electronically submitted, medication list explained in detail. Pt verbalized understanding. Will go over medication list with group home staff as well.  Allergies as of 09/08/2017      Reactions   Motrin [ibuprofen] Shortness Of Breath, Swelling   Swelling of lip/tongue   Tolmetin Rash   Alendronate    Other reaction(s): Nausea And Vomiting Other reaction(s): Nausea And Vomiting   Other Rash   Any Steriods. Causes mouth sores   Tramadol Hcl Nausea And Vomiting      Medication List    STOP taking these medications   carvedilol 3.125 MG tablet Commonly known as:  COREG     TAKE these medications   albuterol 108 (90 Base) MCG/ACT inhaler Commonly known as:  PROVENTIL HFA;VENTOLIN HFA Inhale 2 puffs into the lungs every 6 (six) hours as needed for wheezing or shortness of breath.   albuterol (2.5 MG/3ML) 0.083% nebulizer solution Commonly known as:  PROVENTIL Take 3 mLs (2.5 mg total) by nebulization every 6 (six) hours as needed for Wheezing.   aspirin 81 MG tablet Take 1 tablet (81 mg total) by mouth daily.   atorvastatin 10 MG tablet Commonly known as:  LIPITOR Take 1 tablet (10 mg total) by mouth daily.   busPIRone 10 MG tablet Commonly known as:  BUSPAR Take 1 tablet (10 mg total) by mouth 2 (two) times daily.   Fluticasone-Salmeterol 100-50 MCG/DOSE Aepb Commonly known as:  ADVAIR Inhale 1 puff into the lungs 2 (two) times daily.   furosemide 20 MG tablet Commonly known as:  LASIX Take 0.5 tablets (10 mg total) by mouth daily.   gabapentin 400 MG capsule Commonly known as:  NEURONTIN Take 2 capsules (800 mg total) by mouth 3 (three) times daily.   guaiFENesin-dextromethorphan 100-10 MG/5ML syrup Commonly known as:  ROBITUSSIN DM Take 15 mLs by mouth every 6 (six) hours as needed for cough. Notes to patient:  Took at 9:44  am   HYDROcodone-acetaminophen 7.5-325 MG tablet Commonly known as:  NORCO Take 1 tablet by mouth 2 (two) times daily as needed for moderate pain. For chronic pain. To fill on or after 06-13-2017, 07-13-2017, 08-11-2017   Lidocaine 0.5 % Gel Apply 1 application topically 3 (three) times daily.   nitroGLYCERIN 0.4 MG SL tablet Commonly known as:  NITROSTAT Place 1 tablet (0.4 mg total) under the tongue every 5 (five) minutes as needed for chest pain.   pantoprazole 40 MG tablet Commonly known as:  PROTONIX Take 1 tablet (40 mg total) by mouth daily.   phenytoin 100 MG ER capsule Commonly known as:  DILANTIN Take 2 capsules (200 mg total) by mouth at bedtime. Take 2 by mouth at bedtime   QUEtiapine 50 MG tablet Commonly known as:  SEROQUEL Take 1 tablet (50 mg total) by mouth at bedtime.   sertraline 100 MG tablet Commonly known as:  ZOLOFT Take 1 tablet (100 mg total) by mouth daily.       Vitals:   09/08/17 0547 09/08/17 0723  BP: (!) 111/54 (!) 133/58  Pulse: (!) 49 (!) 53  Resp: 18 18  Temp: 98 F (36.7 C) 97.6 F (36.4 C)  SpO2: 93% 90%    Skin clean, dry and intact without evidence of skin break down, no evidence of skin tears noted. IV catheter discontinued intact. Site without signs and symptoms of complications. Dressing and pressure  applied. Pt denies pain at this time. No complaints noted.  An After Visit Summary was printed and given to the patient. Patient to be transported back to group home by staff member, currently waiting on ride Sandy Daniel Sandy Daniel

## 2017-09-08 NOTE — Progress Notes (Signed)
Discharge packet given to sharon the group home staff member transporting patient . Discharge paper work gone over with Jasmine December, Milk of Magnesium added to patients medication list to take daily as needed per request of patient and Jasmine December. Patient escorted to private vehicle by staff.

## 2017-09-12 ENCOUNTER — Ambulatory Visit: Payer: Self-pay | Admitting: Nurse Practitioner

## 2017-09-17 ENCOUNTER — Ambulatory Visit
Payer: Medicare HMO | Attending: Student in an Organized Health Care Education/Training Program | Admitting: Student in an Organized Health Care Education/Training Program

## 2017-09-17 ENCOUNTER — Other Ambulatory Visit: Payer: Self-pay

## 2017-09-17 ENCOUNTER — Encounter: Payer: Self-pay | Admitting: Student in an Organized Health Care Education/Training Program

## 2017-09-17 VITALS — BP 124/59 | HR 88 | Temp 98.6°F | Resp 18 | Ht 60.0 in | Wt 143.0 lb

## 2017-09-17 DIAGNOSIS — Z760 Encounter for issue of repeat prescription: Secondary | ICD-10-CM | POA: Diagnosis present

## 2017-09-17 DIAGNOSIS — I10 Essential (primary) hypertension: Secondary | ICD-10-CM | POA: Diagnosis not present

## 2017-09-17 DIAGNOSIS — M25511 Pain in right shoulder: Secondary | ICD-10-CM | POA: Insufficient documentation

## 2017-09-17 DIAGNOSIS — R262 Difficulty in walking, not elsewhere classified: Secondary | ICD-10-CM | POA: Diagnosis not present

## 2017-09-17 DIAGNOSIS — Z951 Presence of aortocoronary bypass graft: Secondary | ICD-10-CM | POA: Diagnosis not present

## 2017-09-17 DIAGNOSIS — F17210 Nicotine dependence, cigarettes, uncomplicated: Secondary | ICD-10-CM | POA: Diagnosis not present

## 2017-09-17 DIAGNOSIS — K219 Gastro-esophageal reflux disease without esophagitis: Secondary | ICD-10-CM | POA: Insufficient documentation

## 2017-09-17 DIAGNOSIS — I2510 Atherosclerotic heart disease of native coronary artery without angina pectoris: Secondary | ICD-10-CM | POA: Insufficient documentation

## 2017-09-17 DIAGNOSIS — Z841 Family history of disorders of kidney and ureter: Secondary | ICD-10-CM | POA: Insufficient documentation

## 2017-09-17 DIAGNOSIS — Z833 Family history of diabetes mellitus: Secondary | ICD-10-CM | POA: Insufficient documentation

## 2017-09-17 DIAGNOSIS — M5030 Other cervical disc degeneration, unspecified cervical region: Secondary | ICD-10-CM | POA: Diagnosis not present

## 2017-09-17 DIAGNOSIS — Z886 Allergy status to analgesic agent status: Secondary | ICD-10-CM | POA: Diagnosis not present

## 2017-09-17 DIAGNOSIS — I252 Old myocardial infarction: Secondary | ICD-10-CM | POA: Diagnosis not present

## 2017-09-17 DIAGNOSIS — Z9049 Acquired absence of other specified parts of digestive tract: Secondary | ICD-10-CM | POA: Insufficient documentation

## 2017-09-17 DIAGNOSIS — J449 Chronic obstructive pulmonary disease, unspecified: Secondary | ICD-10-CM | POA: Diagnosis not present

## 2017-09-17 DIAGNOSIS — G40909 Epilepsy, unspecified, not intractable, without status epilepticus: Secondary | ICD-10-CM | POA: Insufficient documentation

## 2017-09-17 DIAGNOSIS — Z955 Presence of coronary angioplasty implant and graft: Secondary | ICD-10-CM | POA: Diagnosis not present

## 2017-09-17 DIAGNOSIS — H3530 Unspecified macular degeneration: Secondary | ICD-10-CM | POA: Diagnosis not present

## 2017-09-17 DIAGNOSIS — Z79899 Other long term (current) drug therapy: Secondary | ICD-10-CM | POA: Insufficient documentation

## 2017-09-17 DIAGNOSIS — G894 Chronic pain syndrome: Secondary | ICD-10-CM | POA: Diagnosis not present

## 2017-09-17 DIAGNOSIS — F419 Anxiety disorder, unspecified: Secondary | ICD-10-CM | POA: Insufficient documentation

## 2017-09-17 DIAGNOSIS — R251 Tremor, unspecified: Secondary | ICD-10-CM | POA: Insufficient documentation

## 2017-09-17 DIAGNOSIS — E785 Hyperlipidemia, unspecified: Secondary | ICD-10-CM | POA: Insufficient documentation

## 2017-09-17 DIAGNOSIS — Z7951 Long term (current) use of inhaled steroids: Secondary | ICD-10-CM | POA: Insufficient documentation

## 2017-09-17 DIAGNOSIS — F329 Major depressive disorder, single episode, unspecified: Secondary | ICD-10-CM | POA: Diagnosis not present

## 2017-09-17 DIAGNOSIS — Z79891 Long term (current) use of opiate analgesic: Secondary | ICD-10-CM | POA: Insufficient documentation

## 2017-09-17 DIAGNOSIS — M546 Pain in thoracic spine: Secondary | ICD-10-CM | POA: Insufficient documentation

## 2017-09-17 DIAGNOSIS — M47812 Spondylosis without myelopathy or radiculopathy, cervical region: Secondary | ICD-10-CM

## 2017-09-17 DIAGNOSIS — E538 Deficiency of other specified B group vitamins: Secondary | ICD-10-CM | POA: Insufficient documentation

## 2017-09-17 DIAGNOSIS — Z8249 Family history of ischemic heart disease and other diseases of the circulatory system: Secondary | ICD-10-CM | POA: Insufficient documentation

## 2017-09-17 DIAGNOSIS — R413 Other amnesia: Secondary | ICD-10-CM | POA: Insufficient documentation

## 2017-09-17 DIAGNOSIS — M542 Cervicalgia: Secondary | ICD-10-CM

## 2017-09-17 DIAGNOSIS — Z7982 Long term (current) use of aspirin: Secondary | ICD-10-CM | POA: Insufficient documentation

## 2017-09-17 MED ORDER — HYDROCODONE-ACETAMINOPHEN 7.5-325 MG PO TABS: 1.0000 | ORAL_TABLET | Freq: Two times a day (BID) | ORAL | 0 refills | Status: DC | PRN

## 2017-09-17 NOTE — Progress Notes (Signed)
Patient's Name: Sandy Daniel  MRN: [id number]  Referring Provider: Mikey College, *  DOB: 09-30-1928  PCP: Mikey College, NP  DOS: 09/17/2017  Note by: Gillis Santa, MD  Service setting: Ambulatory outpatient  Specialty: Interventional Pain Management  Location: [facility] (AMB) Pain Management Facility    Patient type: Established   Primary Reason(s) for Visit: Encounter for prescription drug management. (Level of risk: moderate)  CC: Medication Refill  HPI  Ms. Nouri is a 84 y.o. year old, female patient, who comes today for a medication management evaluation. She has CAD (coronary artery disease); Seizure disorder ([place]); Macular degeneration of both eyes; B12 deficiency; COPD (chronic obstructive pulmonary disease) ([place]); HTN (hypertension); Hyperlipidemia; Arthritis; Depression; GERD (gastroesophageal reflux disease); Dysphagia; Panic disorder; Controlled substance agreement signed; Physiological tremor; Anxiety; Chronic bilateral low back pain; Unstable gait; (HFpEF) heart failure with preserved ejection fraction ([place]); Muscle spasm of back; Chronic bilateral thoracic back pain; Neck pain; Chronic right shoulder pain; Major depressive disorder with single episode, in partial remission ([place]); Mild memory disturbances not amounting to dementia; Chest pain; Cervical spondylosis without myelopathy; Disability of walking; Chronic pain syndrome; and DDD (degenerative disc disease), cervical on their problem list. Her primarily concern today is the Medication Refill  Pain Assessment: Location: Lower Back Radiating: denies Onset: More than a month ago Duration: Chronic pain Quality: Aching Severity: 0-No pain/10 (subjective, self-reported pain score)  Note: Reported level is compatible with observation.                         When using our objective Pain Scale, levels between 6 and 10/10 are said to belong in an emergency room, as it progressively worsens from a 6/10, described as  severely limiting, requiring emergency care not usually available at an outpatient pain management facility. At a 6/10 level, communication becomes difficult and requires great effort. Assistance to reach the emergency department may be required. Facial flushing and profuse sweating along with potentially dangerous increases in heart rate and blood pressure will be evident. Effect on ADL: "Makes it difficult to bend over and get up in the morning"  Timing: Constant Modifying factors: Medications  BP: (!) 124/59  HR: 88  Ms. Hackert was last scheduled for an appointment on 06/13/2017 for medication management. During today's appointment we reviewed Ms. Zara's chronic pain status, as well as her outpatient medication regimen.  The patient  reports that she does not use drugs. Her body mass index is 27.93 kg/m.  Further details on both, my assessment(s), as well as the proposed treatment plan, please see below.  Controlled Substance Pharmacotherapy Assessment REMS (Risk Evaluation and Mitigation Strategy)  Analgesic: Hydrocodone 7.5 mg twice daily as needed, quantity 60/month MME/day: 15 mg/day.  Janne Napoleon, RN  09/17/2017 10:50 AM  Sign at close encounter  Safety precautions to be maintained throughout the outpatient stay will include: orient to surroundings, keep bed in low position, maintain call bell within reach at all times, provide assistance with transfer out of bed and ambulation.    Nursing Pain Medication Assessment:  Safety precautions to be maintained throughout the outpatient stay will include: orient to surroundings, keep bed in low position, maintain call bell within reach at all times, provide assistance with transfer out of bed and ambulation.  Medication Inspection Compliance: Ms. Stonehouse did not comply with our request to bring her pills to be counted. She was reminded that bringing the medication bottles, even when empty, is a requirement.  Medication: None  brought in. Pill/Patch Count: None available to be counted. Bottle Appearance: No container available. Did not bring bottle(s) to appointment. Filled Date: N/A Last Medication intake:  Today   Pharmacokinetics: Liberation and absorption (onset of action): WNL Distribution (time to peak effect): WNL Metabolism and excretion (duration of action): WNL         Pharmacodynamics: Desired effects: Analgesia: Ms. Manzano reports >50% benefit. Functional ability: Patient reports that medication allows her to accomplish basic ADLs Clinically meaningful improvement in function (CMIF): Sustained CMIF goals met Perceived effectiveness: Described as relatively effective, allowing for increase in activities of daily living (ADL) Undesirable effects: Side-effects or Adverse reactions: None reported Monitoring: [place] PMP: Online review of the past 92-monthperiod conducted. Compliant with practice rules and regulations Last UDS on record: Summary  Date Value Ref Range Status  05/14/2017 FINAL  Final    Comment:    ==================================================================== TOXASSURE COMP DRUG ANALYSIS,UR ==================================================================== Test                             Result       Flag       Units Drug Present and Declared for Prescription Verification   Gabapentin                     PRESENT      EXPECTED   Phenytoin                      PRESENT      EXPECTED   Sertraline                     PRESENT      EXPECTED   Desmethylsertraline            PRESENT      EXPECTED    Desmethylsertraline is an expected metabolite of sertraline. Drug Present not Declared for Prescription Verification   Naproxen                       PRESENT      UNEXPECTED Drug Absent but Declared for Prescription Verification   Hydrocodone                    Not Detected UNEXPECTED ng/mg creat   Quetiapine                     Not Detected UNEXPECTED   Acetaminophen                   Not Detected UNEXPECTED    Acetaminophen, as indicated in the declared medication list, is    not always detected even when used as directed.   Salicylate                     Not Detected UNEXPECTED    Aspirin, as indicated in the declared medication list, is not    always detected even when used as directed. ==================================================================== Test                      Result    Flag   Units      Ref Range   Creatinine              97               mg/dL      >=  20 ==================================================================== Declared Medications:  The flagging and interpretation on this report are based on the  following declared medications.  Unexpected results may arise from  inaccuracies in the declared medications.  **Note: The testing scope of this panel includes these medications:  Gabapentin  Hydrocodone (Hydrocodone-Acetaminophen)  Phenytoin  Quetiapine  Sertraline  **Note: The testing scope of this panel does not include small to  moderate amounts of these reported medications:  Acetaminophen (Hydrocodone-Acetaminophen)  Aspirin (Aspirin 81)  **Note: The testing scope of this panel does not include following  reported medications:  Albuterol  Atorvastatin  Buspirone  Carvedilol  Fluticasone (Advair)  Furosemide  Magnesium hydroxide  Nitroglycerin (NTG)  Pantoprazole  Salmeterol (Advair) ==================================================================== For clinical consultation, please call [phone]. ====================================================================    UDS interpretation: Compliant          Medication Assessment Form: Reviewed. Patient indicates being compliant with therapy Treatment compliance: Compliant Risk Assessment Profile: Aberrant behavior: See prior evaluations. None observed or detected today Comorbid factors increasing risk of overdose: See prior notes. No additional risks detected  today Risk of substance use disorder (SUD): Low Opioid Risk Tool - 09/17/17 1048      Family History of Substance Abuse   Alcohol  Negative    Illegal Drugs  Negative    Rx Drugs  Negative      Personal History of Substance Abuse   Alcohol  Negative    Illegal Drugs  Negative    Rx Drugs  Negative      Age   Age between 33-45 years   No      History of Preadolescent Sexual Abuse   History of Preadolescent Sexual Abuse  Negative or Female      Psychological Disease   Psychological Disease  Negative    Depression  Negative      Total Score   Opioid Risk Tool Scoring  0    Opioid Risk Interpretation  Low Risk      ORT Scoring interpretation table:  Score <3 = Low Risk for SUD  Score between 4-7 = Moderate Risk for SUD  Score >8 = High Risk for Opioid Abuse   Risk Mitigation Strategies:  Patient Counseling: Covered Patient-Prescriber Agreement (PPA): Present and active  Notification to other healthcare providers: Done  Pharmacologic Plan: No change in therapy, at this time.             Laboratory Chemistry  Inflammation Markers (CRP: Acute Phase) (ESR: Chronic Phase) Lab Results  Component Value Date   ESRSEDRATE 35 (H) 06/03/2017                         Rheumatology Markers No results found for: RF, ANA, LABURIC, URICUR, LYMEIGGIGMAB, LYMEABIGMQN, HLAB27                      Renal Function Markers Lab Results  Component Value Date   BUN 14 09/07/2017   CREATININE 0.76 10/62/6948   BCR NOT APPLICABLE 54/62/7035   GFRAA >60 09/07/2017   GFRNONAA >60 09/07/2017                             Hepatic Function Markers Lab Results  Component Value Date   AST 49 (H) 08/13/2017   ALT 26 08/13/2017   ALBUMIN 3.3 (L) 08/06/2017   ALKPHOS 133 (H) 08/06/2017   LIPASE 25 03/08/2016  Electrolytes Lab Results  Component Value Date   NA 144 09/07/2017   K 3.5 09/07/2017   CL 108 09/07/2017   CALCIUM 8.4 (L) 09/07/2017   MG 2.0 07/11/2016                         Neuropathy Markers Lab Results  Component Value Date   VITAMINB12 309 03/26/2016   FOLATE 4.3 03/26/2016   HGBA1C 5.5 08/13/2017                        Bone Pathology Markers No results found for: [place], [id number], DG3875IE3, PI9518AC1, 25OHVITD1, 25OHVITD2, 25OHVITD3, TESTOFREE, TESTOSTERONE                       Coagulation Parameters Lab Results  Component Value Date   PLT 209 09/07/2017                        Cardiovascular Markers Lab Results  Component Value Date   BNP 105.0 (H) 07/11/2016   TROPONINI <0.03 09/07/2017   HGB 13.4 09/07/2017   HCT 39.8 09/07/2017                         CA Markers No results found for: CEA, CA125, LABCA2                      Note: Lab results reviewed.  Recent Diagnostic Imaging Results  DG Chest 2 View CLINICAL DATA:  Chest pain  EXAM: CHEST - 2 VIEW  COMPARISON:  06/03/2017  FINDINGS: Cardiac shadow is at the upper limits of normal in size but stable. Aortic calcifications are again seen. Lungs are well aerated bilaterally. Chronic scarring is noted in the left lung stable from the previous exam. Mild increased retrocardiac atelectasis is noted.  IMPRESSION: Chronic scarring in the left base. Left retrocardiac atelectasis is noted.  Electronically Signed   By: Inez Catalina M.D.   On: 09/07/2017 08:37  Complexity Note: Imaging results reviewed. Results shared with Ms. Stepney, using Layman's terms.                         Meds   Current Outpatient Medications:  .  albuterol (PROVENTIL HFA;VENTOLIN HFA) 108 (90 Base) MCG/ACT inhaler, Inhale 2 puffs into the lungs every 6 (six) hours as needed for wheezing or shortness of breath., Disp: 1 Inhaler, Rfl: 1 .  albuterol (PROVENTIL) (2.5 MG/3ML) 0.083% nebulizer solution, Take 3 mLs (2.5 mg total) by nebulization every 6 (six) hours as needed for Wheezing., Disp: 75 mL, Rfl: 1 .  aspirin 81 MG tablet, Take 1 tablet (81 mg total) by mouth daily.,  Disp: 30 tablet, Rfl: 1 .  atorvastatin (LIPITOR) 10 MG tablet, Take 1 tablet (10 mg total) by mouth daily., Disp: 90 tablet, Rfl: 1 .  busPIRone (BUSPAR) 10 MG tablet, Take 1 tablet (10 mg total) by mouth 2 (two) times daily., Disp: 180 tablet, Rfl: 1 .  Fluticasone-Salmeterol (ADVAIR) 100-50 MCG/DOSE AEPB, Inhale 1 puff into the lungs 2 (two) times daily., Disp: 1 each, Rfl: 1 .  furosemide (LASIX) 20 MG tablet, Take 0.5 tablets (10 mg total) by mouth daily., Disp: 45 tablet, Rfl: 1 .  gabapentin (NEURONTIN) 400 MG capsule, Take 2 capsules (800 mg total) by mouth 3 (three) times daily., Disp: 540  capsule, Rfl: 1 .  guaiFENesin-dextromethorphan (ROBITUSSIN DM) 100-10 MG/5ML syrup, Take 15 mLs by mouth every 6 (six) hours as needed for cough., Disp: 118 mL, Rfl: 0 .  HYDROcodone-acetaminophen (NORCO) 7.5-325 MG tablet, Take 1 tablet by mouth 2 (two) times daily as needed for moderate pain. For chronic pain. To fill on or after 09/18/2017, 10/18/2017, 11/16/2017, Disp: 60 tablet, Rfl: 0 .  Lidocaine 0.5 % GEL, Apply 1 application topically 3 (three) times daily., Disp: 170 g, Rfl: 0 .  magnesium hydroxide (MILK OF MAGNESIA) 400 MG/5ML suspension, Take 30 mLs by mouth daily as needed for mild constipation., Disp: 360 mL, Rfl: 3 .  nitroGLYCERIN (NITROSTAT) 0.4 MG SL tablet, Place 1 tablet (0.4 mg total) under the tongue every 5 (five) minutes as needed for chest pain., Disp: 20 tablet, Rfl: 1 .  pantoprazole (PROTONIX) 40 MG tablet, Take 1 tablet (40 mg total) by mouth daily., Disp: 90 tablet, Rfl: 1 .  phenytoin (DILANTIN) 100 MG ER capsule, Take 2 capsules (200 mg total) by mouth at bedtime. Take 2 by mouth at bedtime, Disp: 30 capsule, Rfl: 1 .  QUEtiapine (SEROQUEL) 50 MG tablet, Take 1 tablet (50 mg total) by mouth at bedtime., Disp: 90 tablet, Rfl: 1 .  sertraline (ZOLOFT) 100 MG tablet, Take 1 tablet (100 mg total) by mouth daily., Disp: 90 tablet, Rfl: 1  ROS  Constitutional: Denies any fever or  chills Gastrointestinal: No reported hemesis, hematochezia, vomiting, or acute GI distress Musculoskeletal: Denies any acute onset joint swelling, redness, loss of ROM, or weakness Neurological: No reported episodes of acute onset apraxia, aphasia, dysarthria, agnosia, amnesia, paralysis, loss of coordination, or loss of consciousness  Allergies  Ms. Armenti is allergic to motrin [ibuprofen]; tolmetin; alendronate; other; and tramadol hcl.  [place]  Drug: Ms. Kamaka  reports that she does not use drugs. Alcohol:  reports that she does not drink alcohol. Tobacco:  reports that she has been smoking cigarettes.  She has a 50.00 pack-year smoking history. She has never used smokeless tobacco. Medical:  has a past medical history of Arthritis, Asthma, B12 deficiency, CAD (coronary artery disease), COPD (chronic obstructive pulmonary disease) ([place]), Depression, Epilepsy ([place]), GERD (gastroesophageal reflux disease), Hyperlipidemia, Hypertension, Hypoxemia, Macular degeneration (senile) of retina, Macular degeneration of both eyes, MI (myocardial infarction) ([place]) (09/2003, 05/2013), Physiological tremor, Presence of stent of CABG, and Seizures ([place]). Surgical: Ms. Torre  has a past surgical history that includes Cardiac surgery; Abdominal hysterectomy; Coronary angioplasty with stent (Aug. 2005 and March 2015); Rotator cuff repair (Right); Ventral hernia repair (2002); Cholecystectomy (1962); Appendectomy; Cardiac catheterization (05/2013); and Cataract extraction (03/2016). Family: family history includes Cancer in her brother and sister; Diabetes in her brother, brother, brother, mother, and sister; Heart attack in her brother and sister; Heart disease in her brother and sister; Hyperlipidemia in her brother and sister; Hypertension in her brother and sister; Kidney failure in her brother.  Constitutional Exam  General appearance: alert, cooperative and cachectic Vitals:   09/17/17 1039  BP: (!)  124/59  Pulse: 88  Resp: 18  Temp: 98.6 F (37 C)  SpO2: 92%  Weight: 143 lb (64.9 kg)  Height: 5' (1.524 m)   BMI Assessment: Estimated body mass index is 27.93 kg/m as calculated from the following:   Height as of this encounter: 5' (1.524 m).   Weight as of this encounter: 143 lb (64.9 kg).  BMI interpretation table: BMI level Category Range association with higher incidence of chronic pain  <18  kg/m2 Underweight   18.5-24.9 kg/m2 Ideal body weight   25-29.9 kg/m2 Overweight Increased incidence by 20%  30-34.9 kg/m2 Obese (Class I) Increased incidence by 68%  35-39.9 kg/m2 Severe obesity (Class II) Increased incidence by 136%  >40 kg/m2 Extreme obesity (Class III) Increased incidence by 254%   Patient's current BMI Ideal Body weight  Body mass index is 27.93 kg/m. Ideal body weight: 45.5 kg (100 lb 4.9 oz) Adjusted ideal body weight: 53.2 kg (117 lb 6.2 oz)   BMI Readings from Last 4 Encounters:  09/17/17 27.93 kg/m  09/08/17 26.81 kg/m  08/13/17 26.16 kg/m  08/06/17 26.34 kg/m   Wt Readings from Last 4 Encounters:  09/17/17 143 lb (64.9 kg)  09/08/17 146 lb 9.6 oz (66.5 kg)  08/13/17 143 lb (64.9 kg)  08/06/17 144 lb (65.3 kg)  Psych/Mental status: Alert, oriented x 3 (person, place, & time)       Eyes: PERLA Respiratory: No evidence of acute respiratory distress  Cervical Spine Area Exam  Skin & Axial Inspection: No masses, redness, edema, swelling, or associated skin lesions Alignment: Symmetrical Functional ROM: Decreased ROM, to the left Stability: No instability detected Muscle Tone/Strength: Functionally intact. No obvious neuro-muscular anomalies detected. Sensory (Neurological): Musculoskeletal pain pattern Palpation: Complains of area being tender to palpation              Upper Extremity (UE) Exam    Side: Right upper extremity  Side: Left upper extremity  Skin & Extremity Inspection: Skin color, temperature, and hair growth are WNL. No  peripheral edema or cyanosis. No masses, redness, swelling, asymmetry, or associated skin lesions. No contractures.  Skin & Extremity Inspection: Skin color, temperature, and hair growth are WNL. No peripheral edema or cyanosis. No masses, redness, swelling, asymmetry, or associated skin lesions. No contractures.  Functional ROM: Unrestricted ROM          Functional ROM: Unrestricted ROM          Muscle Tone/Strength: Functionally intact. No obvious neuro-muscular anomalies detected.  Muscle Tone/Strength: Functionally intact. No obvious neuro-muscular anomalies detected.  Sensory (Neurological): Unimpaired          Sensory (Neurological): Unimpaired          Palpation: No palpable anomalies              Palpation: No palpable anomalies              Provocative Test(s):  Phalen's test: deferred Tinel's test: deferred Apley's scratch test (touch opposite shoulder):  Action 1 (Across chest): deferred Action 2 (Overhead): deferred Action 3 (LB reach): deferred   Provocative Test(s):  Phalen's test: deferred Tinel's test: deferred Apley's scratch test (touch opposite shoulder):  Action 1 (Across chest): deferred Action 2 (Overhead): deferred Action 3 (LB reach): deferred    Thoracic Spine Area Exam  Skin & Axial Inspection: No masses, redness, or swelling Alignment: Symmetrical Functional ROM: Unrestricted ROM Stability: No instability detected Muscle Tone/Strength: Functionally intact. No obvious neuro-muscular anomalies detected. Sensory (Neurological): Unimpaired Muscle strength & Tone: No palpable anomalies  Lumbar Spine Area Exam  Skin & Axial Inspection: No masses, redness, or swelling Alignment: Symmetrical Functional ROM: Decreased ROM affecting both sides Stability: No instability detected Muscle Tone/Strength: Functionally intact. No obvious neuro-muscular anomalies detected. Sensory (Neurological): Musculoskeletal pain pattern Palpation: No palpable anomalies        Provocative Tests: Hyperextension/rotation test: deferred today       Lumbar quadrant test (Kemp's test): deferred today  Lateral bending test: deferred today       Patrick's Maneuver: deferred today                   FABER test: deferred today                   S-I anterior distraction/compression test: deferred today         S-I lateral compression test: deferred today         S-I Thigh-thrust test: deferred today         S-I Gaenslen's test: deferred today           Gait & Posture Assessment  Ambulation: Patient came in today in a wheel chair Gait: Significantly limited. Dependent on assistive device to ambulate Posture: Difficulty standing up straight, due to pain   Lower Extremity Exam    Side: Right lower extremity  Side: Left lower extremity  Stability: No instability observed          Stability: No instability observed          Skin & Extremity Inspection: Skin color, temperature, and hair growth are WNL. No peripheral edema or cyanosis. No masses, redness, swelling, asymmetry, or associated skin lesions. No contractures.  Skin & Extremity Inspection: Skin color, temperature, and hair growth are WNL. No peripheral edema or cyanosis. No masses, redness, swelling, asymmetry, or associated skin lesions. No contractures.  Functional ROM: Unrestricted ROM                  Functional ROM: Unrestricted ROM                  Muscle Tone/Strength: Functionally intact. No obvious neuro-muscular anomalies detected.  Muscle Tone/Strength: Functionally intact. No obvious neuro-muscular anomalies detected.  Sensory (Neurological): Unimpaired  Sensory (Neurological): Unimpaired  Palpation: No palpable anomalies  Palpation: No palpable anomalies   Assessment  Primary Diagnosis & Pertinent Problem List: The primary encounter diagnosis was Cervical spondylosis without myelopathy. Diagnoses of Disability of walking, Chronic pain syndrome, DDD (degenerative disc disease), cervical, and  Cervicalgia were also pertinent to this visit.  Status Diagnosis  Persistent Persistent Persistent 1. Cervical spondylosis without myelopathy   2. Disability of walking   3. Chronic pain syndrome   4. DDD (degenerative disc disease), cervical   5. Cervicalgia     Problems updated and reviewed during this visit: Problem  Cervical Spondylosis Without Myelopathy  Disability of Walking  Chronic Pain Syndrome  Ddd (Degenerative Disc Disease), Cervical   Plan of Care  Pharmacotherapy (Medications Ordered): Meds ordered this encounter  Medications  . DISCONTD: HYDROcodone-acetaminophen (NORCO) 7.5-325 MG tablet    Sig: Take 1 tablet by mouth 2 (two) times daily as needed for moderate pain. For chronic pain. To fill on or after 09/18/2017, 10/18/2017, 11/16/2017    Dispense:  60 tablet    Refill:  0    Do not place this medication, or any other prescription from our practice, on "Automatic Refill". Patient may have prescription filled one day early if pharmacy is closed on scheduled refill date.  Marland Kitchen DISCONTD: HYDROcodone-acetaminophen (NORCO) 7.5-325 MG tablet    Sig: Take 1 tablet by mouth 2 (two) times daily as needed for moderate pain. For chronic pain. To fill on or after 09/18/2017, 10/18/2017, 11/16/2017    Dispense:  60 tablet    Refill:  0    Do not place this medication, or any other prescription from our practice, on "Automatic Refill". Patient may  have prescription filled one day early if pharmacy is closed on scheduled refill date.  Marland Kitchen HYDROcodone-acetaminophen (NORCO) 7.5-325 MG tablet    Sig: Take 1 tablet by mouth 2 (two) times daily as needed for moderate pain. For chronic pain. To fill on or after 09/18/2017, 10/18/2017, 11/16/2017    Dispense:  60 tablet    Refill:  0    Do not place this medication, or any other prescription from our practice, on "Automatic Refill". Patient may have prescription filled one day early if pharmacy is closed on scheduled refill date.    Provider-requested follow-up: Return in about 3 months (around 12/18/2017) for Medication Management.  Time Note: Greater than 50% of the 25 minute(s) of face-to-face time spent with Ms. Mcpeek, was spent in counseling/coordination of care regarding: Ms. Maahs's primary cause of pain, the treatment plan, medication side effects, the opioid analgesic risks and possible complications, the appropriate use of her medications, realistic expectations, the medication agreement and the patient's responsibilities when it comes to controlled substances.  Future Appointments  Date Time Provider [place]  09/18/2017  1:30 PM [facility] None  10/08/2017  9:20 AM Minna Merritts, MD [id number]  11/15/2017 10:00 AM Mikey College, NP [facility] None  12/17/2017 10:30 AM Gillis Santa, MD [facility] None    Primary Care Physician: Mikey College, NP Location: [facility] Management Facility Note by: Gillis Santa, M.D Date: 09/17/2017; Time: 11:19 AM  Patient Instructions  You have been given prescriptions for Hydrocodone 7.5-325 to last 12/17/2017.

## 2017-09-17 NOTE — Patient Instructions (Signed)
You have been given prescriptions for Hydrocodone 7.5-325 to last 12/17/2017.

## 2017-09-17 NOTE — Progress Notes (Signed)
  Safety precautions to be maintained throughout the outpatient stay will include: orient to surroundings, keep bed in low position, maintain call bell within reach at all times, provide assistance with transfer out of bed and ambulation.    Nursing Pain Medication Assessment:  Safety precautions to be maintained throughout the outpatient stay will include: orient to surroundings, keep bed in low position, maintain call bell within reach at all times, provide assistance with transfer out of bed and ambulation.  Medication Inspection Compliance: Sandy Daniel did not comply with our request to bring her pills to be counted. She was reminded that bringing the medication bottles, even when empty, is a requirement.  Medication: None brought in. Pill/Patch Count: None available to be counted. Bottle Appearance: No container available. Did not bring bottle(s) to appointment. Filled Date: N/A Last Medication intake:  Today

## 2017-09-18 ENCOUNTER — Other Ambulatory Visit: Payer: Self-pay

## 2017-09-20 ENCOUNTER — Other Ambulatory Visit: Payer: Self-pay | Admitting: Nurse Practitioner

## 2017-09-20 DIAGNOSIS — G40909 Epilepsy, unspecified, not intractable, without status epilepticus: Secondary | ICD-10-CM

## 2017-10-04 NOTE — Progress Notes (Deleted)
Cardiology Office Note  Date:  10/04/2017   ID:  Sandy Daniel, DOB [birth year], MRN [id number]  PCP:  Galen Manila, NP   No chief complaint on file.   HPI:  Sandy Daniel is a 84 y.o. female with a history of  CAD, prior stent placement in the LAD and Circumflex. HTN,  HLD,  GERD,  depression   COPD , smoker admitted with chest pain 09/07/2017   Last cath reported in the cardiology office notes was in 2017 or 2018  stents were patent in the LAD and Circumflex and there was minimal disease in the RCA.   onset of severe chest pain while sitting.  She took 2 NTG and the pain stopped within 10 minutes.  No change in breathing or lower ext edema. She does have a cough but denies fevers or chills.   Patient's EKG and cardiac enzymes were nonrevealing.      PMH:   has a past medical history of Arthritis, Asthma, B12 deficiency, CAD (coronary artery disease), COPD (chronic obstructive pulmonary disease) (HCC), Depression, Epilepsy (HCC), GERD (gastroesophageal reflux disease), Hyperlipidemia, Hypertension, Hypoxemia, Macular degeneration (senile) of retina, Macular degeneration of both eyes, MI (myocardial infarction) (HCC) (09/2003, 05/2013), Physiological tremor, Presence of stent of CABG, and Seizures (HCC).  PSH:    Past Surgical History:  Procedure Laterality Date  . ABDOMINAL HYSTERECTOMY    . APPENDECTOMY    . CARDIAC CATHETERIZATION  05/2013  . CARDIAC SURGERY    . CATARACT EXTRACTION  03/2016  . CHOLECYSTECTOMY  1962  . CORONARY ANGIOPLASTY WITH STENT PLACEMENT  Aug. 2005 and March 2015  . ROTATOR CUFF REPAIR Right   . VENTRAL HERNIA REPAIR  2002    Current Outpatient Medications  Medication Sig Dispense Refill  . albuterol (PROVENTIL HFA;VENTOLIN HFA) 108 (90 Base) MCG/ACT inhaler Inhale 2 puffs into the lungs every 6 (six) hours as needed for wheezing or shortness of breath. 1 Inhaler 1  . albuterol (PROVENTIL) (2.5 MG/3ML) 0.083% nebulizer solution Take  3 mLs (2.5 mg total) by nebulization every 6 (six) hours as needed for Wheezing. 75 mL 1  . aspirin 81 MG tablet Take 1 tablet (81 mg total) by mouth daily. 30 tablet 1  . atorvastatin (LIPITOR) 10 MG tablet Take 1 tablet (10 mg total) by mouth daily. 90 tablet 1  . busPIRone (BUSPAR) 10 MG tablet Take 1 tablet (10 mg total) by mouth 2 (two) times daily. 180 tablet 1  . Fluticasone-Salmeterol (ADVAIR) 100-50 MCG/DOSE AEPB Inhale 1 puff into the lungs 2 (two) times daily. 1 each 1  . furosemide (LASIX) 20 MG tablet Take 0.5 tablets (10 mg total) by mouth daily. 45 tablet 1  . gabapentin (NEURONTIN) 400 MG capsule Take 2 capsules (800 mg total) by mouth 3 (three) times daily. 540 capsule 1  . guaiFENesin-dextromethorphan (ROBITUSSIN DM) 100-10 MG/5ML syrup Take 15 mLs by mouth every 6 (six) hours as needed for cough. 118 mL 0  . HYDROcodone-acetaminophen (NORCO) 7.5-325 MG tablet Take 1 tablet by mouth 2 (two) times daily as needed for moderate pain. For chronic pain. To fill on or after 09/18/2017, 10/18/2017, 11/16/2017 60 tablet 0  . Lidocaine 0.5 % GEL Apply 1 application topically 3 (three) times daily. 170 g 0  . magnesium hydroxide (MILK OF MAGNESIA) 400 MG/5ML suspension Take 30 mLs by mouth daily as needed for mild constipation. 360 mL 3  . nitroGLYCERIN (NITROSTAT) 0.4 MG SL tablet Place 1 tablet (0.4 mg total) under the tongue  every 5 (five) minutes as needed for chest pain. 20 tablet 1  . pantoprazole (PROTONIX) 40 MG tablet Take 1 tablet (40 mg total) by mouth daily. 90 tablet 1  . phenytoin (DILANTIN) 100 MG ER capsule TAKE 2 CAPSULES (200MG ) BY MOUTH AT BEDTIME 30 capsule 5  . QUEtiapine (SEROQUEL) 50 MG tablet Take 1 tablet (50 mg total) by mouth at bedtime. 90 tablet 1  . sertraline (ZOLOFT) 100 MG tablet Take 1 tablet (100 mg total) by mouth daily. 90 tablet 1   No current facility-administered medications for this visit.      Allergies:   Motrin [ibuprofen]; Tolmetin; Alendronate;  Other; and Tramadol hcl   Social History:  The patient  reports that she has been smoking cigarettes. She has a 50.00 pack-year smoking history. She has never used smokeless tobacco. She reports that she does not drink alcohol or use drugs.   Family History:   family history includes Cancer in her brother and sister; Diabetes in her brother, brother, brother, mother, and sister; Heart attack in her brother and sister; Heart disease in her brother and sister; Hyperlipidemia in her brother and sister; Hypertension in her brother and sister; Kidney failure in her brother.    Review of Systems: ROS   PHYSICAL EXAM: VS:  There were no vitals taken for this visit. , BMI There is no height or weight on file to calculate BMI. GEN: Well nourished, well developed, in no acute distress HEENT: normal Neck: no JVD, carotid bruits, or masses Cardiac: RRR; no murmurs, rubs, or gallops,no edema  Respiratory:  clear to auscultation bilaterally, normal work of breathing GI: soft, nontender, nondistended, + BS MS: no deformity or atrophy Skin: warm and dry, no rash Neuro:  Strength and sensation are intact Psych: euthymic mood, full affect    Recent Labs: 08/13/2017: ALT 26 09/07/2017: BUN 14; Creatinine, Ser 0.76; Hemoglobin 13.4; Platelets 209; Potassium 3.5; Sodium 144    Lipid Panel Lab Results  Component Value Date   CHOL 126 09/08/2017   HDL 45 09/08/2017   LDLCALC 68 09/08/2017   TRIG 63 09/08/2017      Wt Readings from Last 3 Encounters:  09/17/17 143 lb (64.9 kg)  09/08/17 146 lb 9.6 oz (66.5 kg)  08/13/17 143 lb (64.9 kg)       ASSESSMENT AND PLAN:  No diagnosis found.   Disposition:   F/U  6 months  No orders of the defined types were placed in this encounter.    Signed, Dossie Arbour, M.D., Ph.D. 10/04/2017  [facility] [place], [place] [phone]

## 2017-10-08 ENCOUNTER — Ambulatory Visit: Payer: Medicare HMO | Admitting: Cardiovascular Disease

## 2017-10-21 ENCOUNTER — Emergency Department: Payer: Medicare HMO

## 2017-10-21 ENCOUNTER — Encounter: Payer: Self-pay | Admitting: Emergency Medicine

## 2017-10-21 ENCOUNTER — Other Ambulatory Visit: Payer: Self-pay

## 2017-10-21 ENCOUNTER — Inpatient Hospital Stay
Admission: EM | Admit: 2017-10-21 | Discharge: 2017-10-27 | DRG: 190 | Disposition: A | Discharge status: Home Health | Payer: Medicare HMO | Attending: Internal Medicine | Admitting: Internal Medicine

## 2017-10-21 DIAGNOSIS — H3530 Unspecified macular degeneration: Secondary | ICD-10-CM | POA: Diagnosis present

## 2017-10-21 DIAGNOSIS — R05 Cough: Secondary | ICD-10-CM

## 2017-10-21 DIAGNOSIS — I5032 Chronic diastolic (congestive) heart failure: Secondary | ICD-10-CM | POA: Diagnosis present

## 2017-10-21 DIAGNOSIS — K219 Gastro-esophageal reflux disease without esophagitis: Secondary | ICD-10-CM | POA: Diagnosis present

## 2017-10-21 DIAGNOSIS — R0602 Shortness of breath: Secondary | ICD-10-CM | POA: Diagnosis present

## 2017-10-21 DIAGNOSIS — Z955 Presence of coronary angioplasty implant and graft: Secondary | ICD-10-CM

## 2017-10-21 DIAGNOSIS — M1990 Unspecified osteoarthritis, unspecified site: Secondary | ICD-10-CM | POA: Diagnosis present

## 2017-10-21 DIAGNOSIS — J441 Chronic obstructive pulmonary disease with (acute) exacerbation: Principal | ICD-10-CM | POA: Diagnosis present

## 2017-10-21 DIAGNOSIS — I739 Peripheral vascular disease, unspecified: Secondary | ICD-10-CM | POA: Diagnosis present

## 2017-10-21 DIAGNOSIS — Z66 Do not resuscitate: Secondary | ICD-10-CM | POA: Diagnosis present

## 2017-10-21 DIAGNOSIS — F329 Major depressive disorder, single episode, unspecified: Secondary | ICD-10-CM | POA: Diagnosis present

## 2017-10-21 DIAGNOSIS — Z951 Presence of aortocoronary bypass graft: Secondary | ICD-10-CM

## 2017-10-21 DIAGNOSIS — Z7951 Long term (current) use of inhaled steroids: Secondary | ICD-10-CM

## 2017-10-21 DIAGNOSIS — F419 Anxiety disorder, unspecified: Secondary | ICD-10-CM | POA: Diagnosis present

## 2017-10-21 DIAGNOSIS — I509 Heart failure, unspecified: Secondary | ICD-10-CM

## 2017-10-21 DIAGNOSIS — Z8249 Family history of ischemic heart disease and other diseases of the circulatory system: Secondary | ICD-10-CM

## 2017-10-21 DIAGNOSIS — Y95 Nosocomial condition: Secondary | ICD-10-CM | POA: Diagnosis present

## 2017-10-21 DIAGNOSIS — R059 Cough, unspecified: Secondary | ICD-10-CM

## 2017-10-21 DIAGNOSIS — F17210 Nicotine dependence, cigarettes, uncomplicated: Secondary | ICD-10-CM | POA: Diagnosis present

## 2017-10-21 DIAGNOSIS — Z90710 Acquired absence of both cervix and uterus: Secondary | ICD-10-CM

## 2017-10-21 DIAGNOSIS — I252 Old myocardial infarction: Secondary | ICD-10-CM | POA: Diagnosis not present

## 2017-10-21 DIAGNOSIS — E785 Hyperlipidemia, unspecified: Secondary | ICD-10-CM | POA: Diagnosis present

## 2017-10-21 DIAGNOSIS — G40909 Epilepsy, unspecified, not intractable, without status epilepticus: Secondary | ICD-10-CM | POA: Diagnosis present

## 2017-10-21 DIAGNOSIS — Z79899 Other long term (current) drug therapy: Secondary | ICD-10-CM

## 2017-10-21 DIAGNOSIS — E782 Mixed hyperlipidemia: Secondary | ICD-10-CM | POA: Diagnosis present

## 2017-10-21 DIAGNOSIS — I313 Pericardial effusion (noninflammatory): Secondary | ICD-10-CM | POA: Diagnosis present

## 2017-10-21 DIAGNOSIS — I10 Essential (primary) hypertension: Secondary | ICD-10-CM | POA: Diagnosis present

## 2017-10-21 DIAGNOSIS — Z886 Allergy status to analgesic agent status: Secondary | ICD-10-CM

## 2017-10-21 DIAGNOSIS — I110 Hypertensive heart disease with heart failure: Secondary | ICD-10-CM | POA: Diagnosis present

## 2017-10-21 DIAGNOSIS — F0390 Unspecified dementia without behavioral disturbance: Secondary | ICD-10-CM | POA: Diagnosis present

## 2017-10-21 DIAGNOSIS — J9601 Acute respiratory failure with hypoxia: Secondary | ICD-10-CM

## 2017-10-21 DIAGNOSIS — J189 Pneumonia, unspecified organism: Secondary | ICD-10-CM | POA: Diagnosis present

## 2017-10-21 DIAGNOSIS — J440 Chronic obstructive pulmonary disease with acute lower respiratory infection: Secondary | ICD-10-CM | POA: Diagnosis present

## 2017-10-21 DIAGNOSIS — I2510 Atherosclerotic heart disease of native coronary artery without angina pectoris: Secondary | ICD-10-CM | POA: Diagnosis present

## 2017-10-21 DIAGNOSIS — Z885 Allergy status to narcotic agent status: Secondary | ICD-10-CM

## 2017-10-21 DIAGNOSIS — Z888 Allergy status to other drugs, medicaments and biological substances status: Secondary | ICD-10-CM

## 2017-10-21 DIAGNOSIS — Z7982 Long term (current) use of aspirin: Secondary | ICD-10-CM

## 2017-10-21 DIAGNOSIS — I340 Nonrheumatic mitral (valve) insufficiency: Secondary | ICD-10-CM | POA: Diagnosis not present

## 2017-10-21 DIAGNOSIS — Z9049 Acquired absence of other specified parts of digestive tract: Secondary | ICD-10-CM | POA: Diagnosis not present

## 2017-10-21 DIAGNOSIS — I5030 Unspecified diastolic (congestive) heart failure: Secondary | ICD-10-CM | POA: Diagnosis present

## 2017-10-21 LAB — CBC WITH DIFFERENTIAL/PLATELET
Basophils Absolute: 0 10*3/uL (ref 0–0.1)
Basophils Relative: 0 %
EOS PCT: 1 %
Eosinophils Absolute: 0 10*3/uL (ref 0–0.7)
HCT: 40.9 % (ref 35.0–47.0)
HEMOGLOBIN: 13.9 g/dL (ref 12.0–16.0)
LYMPHS ABS: 2 10*3/uL (ref 1.0–3.6)
Lymphocytes Relative: 44 %
MCH: 30.8 pg (ref 26.0–34.0)
MCHC: 33.9 g/dL (ref 32.0–36.0)
MCV: 90.9 fL (ref 80.0–100.0)
MONOS PCT: 13 %
Monocytes Absolute: 0.6 10*3/uL (ref 0.2–0.9)
Neutro Abs: 1.9 10*3/uL (ref 1.4–6.5)
Neutrophils Relative %: 42 %
PLATELETS: 220 10*3/uL (ref 150–440)
RBC: 4.5 MIL/uL (ref 3.80–5.20)
RDW: 16.5 % — ABNORMAL HIGH (ref 11.5–14.5)
WBC: 4.5 10*3/uL (ref 3.6–11.0)

## 2017-10-21 LAB — BLOOD GAS, VENOUS
ACID-BASE EXCESS: 4.3 mmol/L — AB (ref 0.0–2.0)
Bicarbonate: 31.8 mmol/L — ABNORMAL HIGH (ref 20.0–28.0)
O2 Saturation: 63 %
Patient temperature: 37
pCO2, Ven: 59 mmHg (ref 44.0–60.0)
pH, Ven: 7.34 (ref 7.250–7.430)
pO2, Ven: 35 mmHg (ref 32.0–45.0)

## 2017-10-21 LAB — BASIC METABOLIC PANEL
Anion gap: 9 (ref 5–15)
BUN: 15 mg/dL (ref 8–23)
CHLORIDE: 104 mmol/L (ref 98–111)
CO2: 29 mmol/L (ref 22–32)
CREATININE: 0.79 mg/dL (ref 0.44–1.00)
Calcium: 8.4 mg/dL — ABNORMAL LOW (ref 8.9–10.3)
GFR calc Af Amer: >60 mL/min (ref >60)
GFR calc non Af Amer: >60 mL/min (ref >60)
Glucose, Bld: 124 mg/dL — ABNORMAL HIGH (ref 70–99)
Potassium: 3.6 mmol/L (ref 3.5–5.1)
Sodium: 142 mmol/L (ref 135–145)

## 2017-10-21 LAB — BRAIN NATRIURETIC PEPTIDE: B Natriuretic Peptide: 281 pg/mL — ABNORMAL HIGH (ref 0.0–100.0)

## 2017-10-21 LAB — LACTIC ACID, PLASMA: Lactic Acid, Venous: 1.1 mmol/L (ref 0.5–1.9)

## 2017-10-21 MED ORDER — METHYLPREDNISOLONE SODIUM SUCC 125 MG IJ SOLR
125.0000 mg | Freq: Once | INTRAMUSCULAR | Status: AC
  Administered 2017-10-21: 125 mg via INTRAVENOUS
  Filled 2017-10-21: qty 2

## 2017-10-21 MED ORDER — AMOXICILLIN-POT CLAVULANATE 875-125 MG PO TABS
1.0000 | ORAL_TABLET | Freq: Once | ORAL | Status: AC
  Administered 2017-10-21: 1 via ORAL
  Filled 2017-10-21: qty 1

## 2017-10-21 MED ORDER — FUROSEMIDE 10 MG/ML IJ SOLN
20.0000 mg | Freq: Once | INTRAMUSCULAR | Status: AC
  Administered 2017-10-21: 20 mg via INTRAVENOUS
  Filled 2017-10-21: qty 4

## 2017-10-21 MED ORDER — ALBUTEROL SULFATE (2.5 MG/3ML) 0.083% IN NEBU
5.0000 mg | INHALATION_SOLUTION | Freq: Once | RESPIRATORY_TRACT | Status: AC
  Administered 2017-10-21: 5 mg via RESPIRATORY_TRACT
  Filled 2017-10-21: qty 6

## 2017-10-21 NOTE — ED Notes (Signed)
Cuff to large and falling off patient. Rechecked with proper size cuff

## 2017-10-21 NOTE — ED Provider Notes (Signed)
[facility] Emergency Department Provider Note  ____________________________________________  Time seen: Approximately 9:52 PM  I have reviewed the triage vital signs and the nursing notes.   HISTORY  Chief Complaint Shortness of Breath  Level 5 caveat:  Portions of the history and physical were unable to be obtained due to dementia   HPI Sandy Daniel is a 84 y.o. female with a history of dementia, CHF with preserved EF, CAD, COPD, hypertension, hyperlipidemia who presents for evaluation of shortness of breath.  Patient is complaining of 2 weeks of productive cough and progressively worsening shortness of breath.  Shortness of breath has become severe over the last 3 days.  The cough productive of yellow phlegm.  No hemoptysis.  Patient has a history of COPD.  No fever or chills, no chest pain, no personal or family history of blood clots, recent travel or immobilization, leg pain.  She does have swelling of both of her extremities.  Patient is unable to tell me what makes her shortness of breath worse.  Past Medical History:  Diagnosis Date  . Arthritis   . Asthma   . B12 deficiency   . CAD (coronary artery disease)   . COPD (chronic obstructive pulmonary disease) (HCC)   . Depression   . Epilepsy (HCC)   . GERD (gastroesophageal reflux disease)   . Hyperlipidemia   . Hypertension   . Hypoxemia   . Macular degeneration (senile) of retina   . Macular degeneration of both eyes   . MI (myocardial infarction) (HCC) 09/2003, 05/2013  . Physiological tremor   . Presence of stent of CABG    x2   . Seizures [facility])     Patient Active Problem List   Diagnosis Date Noted  . Cervical spondylosis without myelopathy 09/17/2017  . Disability of walking 09/17/2017  . Chronic pain syndrome 09/17/2017  . DDD (degenerative disc disease), cervical 09/17/2017  . Chest pain 09/07/2017  . 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 Major  depressive disorder with single episode, in partial remission (HCC) 05/15/2017  . Neck pain 01/31/2017  . Chronic right shoulder pain 01/31/2017  . Chronic bilateral thoracic back pain 12/04/2016  . Muscle spasm of back 10/22/2016  . (HFpEF) heart failure with preserved ejection fraction (HCC) 09/11/2016  . Anxiety 07/16/2016  . Chronic bilateral low back pain 07/16/2016  . Unstable gait 07/16/2016  . Controlled substance agreement signed 03/27/2016  . Dysphagia 03/26/2016  . Panic disorder 03/26/2016  . CAD (coronary artery disease)   . Seizure disorder (HCC)   . Macular degeneration of both eyes   . B12 deficiency   . COPD (chronic obstructive pulmonary disease) (HCC)   . HTN (hypertension)   . Hyperlipidemia   . Arthritis   . Depression   . GERD (gastroesophageal reflux disease)   . Physiological tremor 02/18/2014    Past Surgical History:  Procedure Laterality Date  . ABDOMINAL HYSTERECTOMY    . APPENDECTOMY    . CARDIAC CATHETERIZATION  05/2013  . CARDIAC SURGERY    . CATARACT EXTRACTION  03/2016  . CHOLECYSTECTOMY  1962  . CORONARY ANGIOPLASTY WITH STENT PLACEMENT  Aug. 2005 and March 2015  . ROTATOR CUFF REPAIR Right   . VENTRAL HERNIA REPAIR  2002    Prior to Admission medications   Medication Sig Start Date End Date Taking? Authorizing Provider  albuterol (PROVENTIL HFA;VENTOLIN HFA) 108 (90 Base) MCG/ACT inhaler Inhale 2 puffs into the lungs every 6 (six)  hours as needed for wheezing or shortness of breath. 08/13/17   Galen Manila, NP  albuterol (PROVENTIL) (2.5 MG/3ML) 0.083% nebulizer solution Take 3 mLs (2.5 mg total) by nebulization every 6 (six) hours as needed for Wheezing. 08/13/17   Galen Manila, NP  aspirin 81 MG tablet Take 1 tablet (81 mg total) by mouth daily. 06/07/17   Dionne Bucy, MD  atorvastatin (LIPITOR) 10 MG tablet Take 1 tablet (10 mg total) by mouth daily. 08/13/17   Galen Manila, NP  busPIRone (BUSPAR) 10 MG  tablet Take 1 tablet (10 mg total) by mouth 2 (two) times daily. 08/13/17   Galen Manila, NP  Fluticasone-Salmeterol (ADVAIR) 100-50 MCG/DOSE AEPB Inhale 1 puff into the lungs 2 (two) times daily. 08/13/17   Galen Manila, NP  furosemide (LASIX) 20 MG tablet Take 0.5 tablets (10 mg total) by mouth daily. 08/13/17   Galen Manila, NP  gabapentin (NEURONTIN) 400 MG capsule Take 2 capsules (800 mg total) by mouth 3 (three) times daily. 08/13/17   Galen Manila, NP  guaiFENesin-dextromethorphan (ROBITUSSIN DM) 100-10 MG/5ML syrup Take 15 mLs by mouth every 6 (six) hours as needed for cough. 09/08/17   Auburn Bilberry, MD  HYDROcodone-acetaminophen (NORCO) 7.5-325 MG tablet Take 1 tablet by mouth 2 (two) times daily as needed for moderate pain. For chronic pain. To fill on or after 09/18/2017, 10/18/2017, 11/16/2017 09/17/17   Edward Jolly, MD  Lidocaine 0.5 % GEL Apply 1 application topically 3 (three) times daily. 07/12/17   Enid Derry, PA-C  magnesium hydroxide (MILK OF MAGNESIA) 400 MG/5ML suspension Take 30 mLs by mouth daily as needed for mild constipation. 09/08/17 09/08/18  Auburn Bilberry, MD  nitroGLYCERIN (NITROSTAT) 0.4 MG SL tablet Place 1 tablet (0.4 mg total) under the tongue every 5 (five) minutes as needed for chest pain. 06/12/17   Galen Manila, NP  pantoprazole (PROTONIX) 40 MG tablet Take 1 tablet (40 mg total) by mouth daily. 08/13/17   Galen Manila, NP  phenytoin (DILANTIN) 100 MG ER capsule TAKE 2 CAPSULES (200MG ) BY MOUTH AT BEDTIME 09/20/17   Galen Manila, NP  QUEtiapine (SEROQUEL) 50 MG tablet Take 1 tablet (50 mg total) by mouth at bedtime. 08/13/17   Galen Manila, NP  sertraline (ZOLOFT) 100 MG tablet Take 1 tablet (100 mg total) by mouth daily. 08/13/17   Galen Manila, NP    Allergies Motrin [ibuprofen]; Tolmetin; Alendronate; Other; and Tramadol hcl  Family History  Problem Relation Age of Onset  . Diabetes Mother     . Cancer Sister   . Diabetes Sister   . Heart disease Sister   . Heart attack Sister   . Hyperlipidemia Sister   . Hypertension Sister   . Cancer Brother   . Diabetes Brother   . Heart disease Brother   . Heart attack Brother   . Hyperlipidemia Brother   . Hypertension Brother   . Diabetes Brother   . Diabetes Brother   . Kidney failure Brother     Social History Social History   Tobacco Use  . Smoking status: Current Every Day Smoker    Packs/day: 1.00    Years: 50.00    Pack years: 50.00    Types: Cigarettes  . Smokeless tobacco: Never Used  . Tobacco comment: 10 a day   Substance Use Topics  . Alcohol use: No  . Drug use: No    Review of Systems  Constitutional: Negative for  fever. Eyes: Negative for visual changes. ENT: Negative for sore throat. Neck: No neck pain  Cardiovascular: Negative for chest pain. Respiratory: + shortness of breath and cough Gastrointestinal: Negative for abdominal pain, vomiting or diarrhea. Genitourinary: Negative for dysuria. Musculoskeletal: Negative for back pain. Skin: Negative for rash. Neurological: Negative for headaches, weakness or numbness. Psych: No SI or HI  ____________________________________________   PHYSICAL EXAM:  VITAL SIGNS: ED Triage Vitals  Enc Vitals Group     BP 10/21/17 2059 (!) 165/64     Pulse Rate 10/21/17 2059 82     Resp 10/21/17 2059 20     Temp 10/21/17 2059 98.3 F (36.8 C)     Temp Source 10/21/17 2059 Oral     SpO2 10/21/17 2059 93 %     Weight 10/21/17 2101 150 lb (68 kg)     Height 10/21/17 2101 5\' 5"  (1.651 m)     Head Circumference --      Peak Flow --      Pain Score 10/21/17 2101 5     Pain Loc --      Pain Edu? --      Excl. in GC? --     Constitutional: Alert and oriented x 2, mild respiratory distress.  HEENT:      Head: Normocephalic and atraumatic.         Eyes: Conjunctivae are normal. Sclera is non-icteric.       Mouth/Throat: Mucous membranes are moist.        Neck: Supple with no signs of meningismus. Cardiovascular: Regular rate and rhythm. No murmurs, gallops, or rubs. 2+ symmetrical distal pulses are present in all extremities. Respiratory: Slightly increased work of breathing, tachypnea, hypoxic on RA requiring 3L [place], decreased air movement bilaterally with faint wheezes and bilateral crackles Gastrointestinal: Soft, non tender, and non distended with positive bowel sounds. No rebound or guarding. Musculoskeletal: 1+ pitting edema bilateral Neurologic: Normal speech and language. Face is symmetric. Moving all extremities. No gross focal neurologic deficits are appreciated. Skin: Skin is warm, dry and intact. No rash noted. Psychiatric: Mood and affect are normal. Speech and behavior are normal.  ____________________________________________   LABS (all labs ordered are listed, but only abnormal results are displayed)  Labs Reviewed  CBC WITH DIFFERENTIAL/PLATELET - Abnormal; Notable for the following components:      Result Value   RDW 16.5 (*)    All other components within normal limits  BASIC METABOLIC PANEL - Abnormal; Notable for the following components:   Glucose, Bld 124 (*)    Calcium 8.4 (*)    All other components within normal limits  BLOOD GAS, VENOUS - Abnormal; Notable for the following components:   Bicarbonate 31.8 (*)    Acid-Base Excess 4.3 (*)    All other components within normal limits  BRAIN NATRIURETIC PEPTIDE - Abnormal; Notable for the following components:   B Natriuretic Peptide 281.0 (*)    All other components within normal limits  LACTIC ACID, PLASMA  LACTIC ACID, PLASMA   ____________________________________________  EKG  ED ECG REPORT I, Nita Sickle, the attending physician, personally viewed and interpreted this ECG.  Normal sinus rhythm, rate of 67, first-degree AV block, normal QRS and QTC, left axis deviation, LAFB, no ST elevations or depressions.  No significant changes when compared  to prior. ____________________________________________  RADIOLOGY  I have personally reviewed the images performed during this visit and I agree with the Radiologist's read.   Interpretation by Radiologist:  Dg Chest  1 View  Result Date: 10/21/2017 CLINICAL DATA:  Short of breath EXAM: CHEST  1 VIEW COMPARISON:  09/07/2017 FINDINGS: Cardiomegaly without overt edema. Linear scarring or atelectasis within the left mid and lower lung. Aortic atherosclerosis. No pneumothorax. IMPRESSION: No active disease. Cardiomegaly with stable scarring in the left mid and lower lung. Electronically Signed   By: Jasmine Pang M.D.   On: 10/21/2017 21:38     ____________________________________________   PROCEDURES  Procedure(s) performed: None Procedures Critical Care performed: yes  CRITICAL CARE Performed by: Nita Sickle  ?  Total critical care time: 35 min  Critical care time was exclusive of separately billable procedures and treating other patients.  Critical care was necessary to treat or prevent imminent or life-threatening deterioration.  Critical care was time spent personally by me on the following activities: development of treatment plan with patient and/or surrogate as well as nursing, discussions with consultants, evaluation of patient's response to treatment, examination of patient, obtaining history from patient or surrogate, ordering and performing treatments and interventions, ordering and review of laboratory studies, ordering and review of radiographic studies, pulse oximetry and re-evaluation of patient's condition.  ____________________________________________   INITIAL IMPRESSION / ASSESSMENT AND PLAN / ED COURSE  84 y.o. female with a history of dementia, CHF with preserved EF, CAD, COPD, hypertension, hyperlipidemia who presents for evaluation of shortness of breath.  Presentation concerning for COPD plus CHF exacerbation.  Patient hypoxic on room air, she does  have decreased air movement with crackles and wheezes.  Chest x-ray showing cardiomegaly but no infiltrate or overt edema.  Patient looks hypervolemic on exam.  Will give 20 mg of IV Lasix, DuoNeb and Solu-Medrol, will start patient on Augmentin.  No signs of sepsis with no fever, normal white count.  VBG showing normal pH and CO2, lactic acid is normal.   _________________________ 11:20 PM on 10/21/2017 -----------------------------------------  Due to new oxygen requirement patient will be admitted to the hospitalist service for CHF plus COPD exacerbation.    As part of my medical decision making, I reviewed the following data within the electronic MEDICAL RECORD NUMBER Nursing notes reviewed and incorporated, Labs reviewed , EKG interpreted , Old EKG reviewed, Old chart reviewed, Radiograph reviewed , Notes from prior ED visits and [place] Controlled Substance Database, Discussed with Dr. Anne Hahn.    Pertinent labs & imaging results that were available during my care of the patient were reviewed by me and considered in my medical decision making (see chart for details).    ____________________________________________   FINAL CLINICAL IMPRESSION(S) / ED DIAGNOSES  Final diagnoses:  Acute respiratory failure with hypoxia (HCC)  COPD exacerbation (HCC)  Acute on chronic congestive heart failure, unspecified heart failure type (HCC)      NEW MEDICATIONS STARTED DURING THIS VISIT:  ED Discharge Orders    None       Note:  This document was prepared using Dragon voice recognition software and may include unintentional dictation errors.    Don Perking, [place], MD 10/21/17 [phone]

## 2017-10-21 NOTE — ED Notes (Addendum)
Pt resides at [facility] in [place] [phone]

## 2017-10-21 NOTE — H&P (Signed)
[facility] - [place] at [facility]   PATIENT NAME: Sandy Daniel    MR#:  [id number]  DATE OF BIRTH:  Jun 17, 1928  DATE OF ADMISSION:  10/21/2017  PRIMARY CARE PHYSICIAN: Galen Manila, NP   REQUESTING/REFERRING PHYSICIAN: Don Perking, MD  CHIEF COMPLAINT:   Chief Complaint  Patient presents with  . Shortness of Breath    HISTORY OF PRESENT ILLNESS:  Sandy Daniel  is a 84 y.o. female who presents with chief complaint as above.  Patient states that for the past couple of weeks her breathing is been getting worse.  She came to the ED today with significant shortness of breath.  Work-up she is found to have COPD exacerbation, as well as potentially some exacerbation of her heart failure.  She was treated for COPD and with IV Lasix in the ED, and hospitalist were called for admission  PAST MEDICAL HISTORY:   Past Medical History:  Diagnosis Date  . Arthritis   . Asthma   . B12 deficiency   . CAD (coronary artery disease)   . COPD (chronic obstructive pulmonary disease) (HCC)   . Depression   . Epilepsy (HCC)   . GERD (gastroesophageal reflux disease)   . Hyperlipidemia   . Hypertension   . Hypoxemia   . Macular degeneration (senile) of retina   . Macular degeneration of both eyes   . MI (myocardial infarction) (HCC) 09/2003, 05/2013  . Physiological tremor   . Presence of stent of CABG    x2   . Seizures (HCC)      PAST SURGICAL HISTORY:   Past Surgical History:  Procedure Laterality Date  . ABDOMINAL HYSTERECTOMY    . APPENDECTOMY    . CARDIAC CATHETERIZATION  05/2013  . CARDIAC SURGERY    . CATARACT EXTRACTION  03/2016  . CHOLECYSTECTOMY  1962  . CORONARY ANGIOPLASTY WITH STENT PLACEMENT  Aug. 2005 and March 2015  . ROTATOR CUFF REPAIR Right   . VENTRAL HERNIA REPAIR  2002     SOCIAL HISTORY:   Social History   Tobacco Use  . Smoking status: Current Every Day Smoker    Packs/day: 1.00    Years: 50.00    Pack  years: 50.00    Types: Cigarettes  . Smokeless tobacco: Never Used  . Tobacco comment: 10 a day   Substance Use Topics  . Alcohol use: No     FAMILY HISTORY:   Family History  Problem Relation Age of Onset  . Diabetes Mother   . Cancer Sister   . Diabetes Sister   . Heart disease Sister   . Heart attack Sister   . Hyperlipidemia Sister   . Hypertension Sister   . Cancer Brother   . Diabetes Brother   . Heart disease Brother   . Heart attack Brother   . Hyperlipidemia Brother   . Hypertension Brother   . Diabetes Brother   . Diabetes Brother   . Kidney failure Brother      DRUG ALLERGIES:   Allergies  Allergen Reactions  . Motrin [Ibuprofen] Shortness Of Breath and Swelling    Swelling of lip/tongue  . Tolmetin Rash  . Alendronate     Other reaction(s): Nausea And Vomiting Other reaction(s): Nausea And Vomiting  . Other Rash    Any Steriods. Causes mouth sores  . Tramadol Hcl Nausea And Vomiting    MEDICATIONS AT HOME:   Prior to Admission medications   Medication Sig Start Date End Date Taking?  Authorizing Provider  albuterol (PROVENTIL HFA;VENTOLIN HFA) 108 (90 Base) MCG/ACT inhaler Inhale 2 puffs into the lungs every 6 (six) hours as needed for wheezing or shortness of breath. 08/13/17   Galen Manila, NP  albuterol (PROVENTIL) (2.5 MG/3ML) 0.083% nebulizer solution Take 3 mLs (2.5 mg total) by nebulization every 6 (six) hours as needed for Wheezing. 08/13/17   Galen Manila, NP  aspirin 81 MG tablet Take 1 tablet (81 mg total) by mouth daily. 06/07/17   Dionne Bucy, MD  atorvastatin (LIPITOR) 10 MG tablet Take 1 tablet (10 mg total) by mouth daily. 08/13/17   Galen Manila, NP  busPIRone (BUSPAR) 10 MG tablet Take 1 tablet (10 mg total) by mouth 2 (two) times daily. 08/13/17   Galen Manila, NP  Fluticasone-Salmeterol (ADVAIR) 100-50 MCG/DOSE AEPB Inhale 1 puff into the lungs 2 (two) times daily. 08/13/17   Galen Manila,  NP  furosemide (LASIX) 20 MG tablet Take 0.5 tablets (10 mg total) by mouth daily. 08/13/17   Galen Manila, NP  gabapentin (NEURONTIN) 400 MG capsule Take 2 capsules (800 mg total) by mouth 3 (three) times daily. 08/13/17   Galen Manila, NP  guaiFENesin-dextromethorphan (ROBITUSSIN DM) 100-10 MG/5ML syrup Take 15 mLs by mouth every 6 (six) hours as needed for cough. 09/08/17   Auburn Bilberry, MD  HYDROcodone-acetaminophen (NORCO) 7.5-325 MG tablet Take 1 tablet by mouth 2 (two) times daily as needed for moderate pain. For chronic pain. To fill on or after 09/18/2017, 10/18/2017, 11/16/2017 09/17/17   Edward Jolly, MD  Lidocaine 0.5 % GEL Apply 1 application topically 3 (three) times daily. 07/12/17   Enid Derry, PA-C  magnesium hydroxide (MILK OF MAGNESIA) 400 MG/5ML suspension Take 30 mLs by mouth daily as needed for mild constipation. 09/08/17 09/08/18  Auburn Bilberry, MD  nitroGLYCERIN (NITROSTAT) 0.4 MG SL tablet Place 1 tablet (0.4 mg total) under the tongue every 5 (five) minutes as needed for chest pain. 06/12/17   Galen Manila, NP  pantoprazole (PROTONIX) 40 MG tablet Take 1 tablet (40 mg total) by mouth daily. 08/13/17   Galen Manila, NP  phenytoin (DILANTIN) 100 MG ER capsule TAKE 2 CAPSULES (200MG ) BY MOUTH AT BEDTIME 09/20/17   Galen Manila, NP  QUEtiapine (SEROQUEL) 50 MG tablet Take 1 tablet (50 mg total) by mouth at bedtime. 08/13/17   Galen Manila, NP  sertraline (ZOLOFT) 100 MG tablet Take 1 tablet (100 mg total) by mouth daily. 08/13/17   Galen Manila, NP    REVIEW OF SYSTEMS:  Review of Systems  Constitutional: Negative for chills, fever, malaise/fatigue and weight loss.  HENT: Negative for ear pain, hearing loss and tinnitus.   Eyes: Negative for blurred vision, double vision, pain and redness.  Respiratory: Positive for cough, shortness of breath and wheezing. Negative for hemoptysis.   Cardiovascular: Negative for chest pain,  palpitations, orthopnea and leg swelling.  Gastrointestinal: Negative for abdominal pain, constipation, diarrhea, nausea and vomiting.  Genitourinary: Negative for dysuria, frequency and hematuria.  Musculoskeletal: Negative for back pain, joint pain and neck pain.  Skin:       No acne, rash, or lesions  Neurological: Negative for dizziness, tremors, focal weakness and weakness.  Endo/Heme/Allergies: Negative for polydipsia. Does not bruise/bleed easily.  Psychiatric/Behavioral: Negative for depression. The patient is not nervous/anxious and does not have insomnia.      VITAL SIGNS:   Vitals:   10/21/17 2215 10/21/17 2230 10/21/17 2245 10/21/17 2258  BP:  (!) 89/39  (!) 129/56  Pulse: 64 64 64   Resp:      Temp:      TempSrc:      SpO2: 93% (!) 89% 94%   Weight:      Height:       Wt Readings from Last 3 Encounters:  10/21/17 68 kg  09/17/17 64.9 kg  09/08/17 66.5 kg    PHYSICAL EXAMINATION:  Physical Exam  Vitals reviewed. Constitutional: She is oriented to person, place, and time. She appears well-developed and well-nourished. No distress.  HENT:  Head: Normocephalic and atraumatic.  Mouth/Throat: Oropharynx is clear and moist.  Eyes: Pupils are equal, round, and reactive to light. Conjunctivae and EOM are normal. No scleral icterus.  Neck: Normal range of motion. Neck supple. No JVD present. No thyromegaly present.  Cardiovascular: Normal rate, regular rhythm and intact distal pulses. Exam reveals no gallop and no friction rub.  No murmur heard. Respiratory: She is in respiratory distress (Mild). She has wheezes. She has no rales.  GI: Soft. Bowel sounds are normal. She exhibits no distension. There is no tenderness.  Musculoskeletal: Normal range of motion. She exhibits no edema.  No arthritis, no gout  Lymphadenopathy:    She has no cervical adenopathy.  Neurological: She is alert and oriented to person, place, and time. No cranial nerve deficit.  No dysarthria,  no aphasia  Skin: Skin is warm and dry. No rash noted. No erythema.  Psychiatric: She has a normal mood and affect. Her behavior is normal. Judgment and thought content normal.    LABORATORY PANEL:   CBC Recent Labs  Lab 10/21/17 2107  WBC 4.5  HGB 13.9  HCT 40.9  PLT 220   ------------------------------------------------------------------------------------------------------------------  Chemistries  Recent Labs  Lab 10/21/17 2107  NA 142  K 3.6  CL 104  CO2 29  GLUCOSE 124*  BUN 15  CREATININE 0.79  CALCIUM 8.4*   ------------------------------------------------------------------------------------------------------------------  Cardiac Enzymes No results for input(s): TROPONINI in the last 168 hours. ------------------------------------------------------------------------------------------------------------------  RADIOLOGY:  Dg Chest 1 View  Result Date: 10/21/2017 CLINICAL DATA:  Short of breath EXAM: CHEST  1 VIEW COMPARISON:  09/07/2017 FINDINGS: Cardiomegaly without overt edema. Linear scarring or atelectasis within the left mid and lower lung. Aortic atherosclerosis. No pneumothorax. IMPRESSION: No active disease. Cardiomegaly with stable scarring in the left mid and lower lung. Electronically Signed   By: Jasmine Pang M.D.   On: 10/21/2017 21:38    EKG:   Orders placed or performed during the hospital encounter of 10/21/17  . EKG 12-Lead  . EKG 12-Lead  . ED EKG  . ED EKG    IMPRESSION AND PLAN:  Principal Problem:   COPD with acute exacerbation (HCC) -IV Solu-Medrol, duo nebs, azithromycin, PRN supportive treatment, continue home inhalers Active Problems:   (HFpEF) heart failure with preserved ejection fraction (HCC) -no significant pulmonary edema, no lower extremity edema, though her BNP was somewhat elevated.  Suspect this is likely due to demand from her COPD exacerbation.  She was given a dose of IV Lasix in the ED, can re-dose this if needed,  continue home meds.   CAD (coronary artery disease) -continue home medications   HTN (hypertension) -home dose antihypertensives   Seizure disorder (HCC) -home dose antiepileptic   Hyperlipidemia -Home dose antilipid   GERD (gastroesophageal reflux disease) -Home dose PPI   Anxiety -home dose anxiolytic  Chart review performed and case discussed with ED provider. Labs, imaging and/or  ECG reviewed by provider and discussed with patient/family. Management plans discussed with the patient and/or family.  DVT PROPHYLAXIS: SubQ lovenox   GI PROPHYLAXIS:  PPI   ADMISSION STATUS: Inpatient     CODE STATUS: Full Code Status History    Date Active Date Inactive Code Status Order ID Comments User Context   09/07/2017 1354 09/08/2017 1933 Full Code [id number]  Shaune Pollack, MD Inpatient   07/11/2016 1416 07/13/2016 1824 Full Code [id number]  Milagros Loll, MD ED      TOTAL TIME TAKING CARE OF THIS PATIENT: 45 minutes.   Anne Hahn, Lavetta Geier FIELDING 10/21/2017, 11:45 PM  [employer]  [phone]  CC: Primary care physician; Galen Manila, NP  Note:  This document was prepared using Dragon voice recognition software and may include unintentional dictation errors.

## 2017-10-21 NOTE — ED Triage Notes (Signed)
Pt brought in by [facility] with complaints of a cough for three days, it is productive with yellow phlem. She is has wheezing (EMS gave her a breathing tx). She is able to speak in complete sentences.

## 2017-10-22 ENCOUNTER — Inpatient Hospital Stay (HOSPITAL_COMMUNITY)
Admit: 2017-10-22 | Discharge: 2017-10-22 | Disposition: A | Discharge status: Home / Self Care | Payer: Medicare HMO | Attending: Internal Medicine | Admitting: Internal Medicine

## 2017-10-22 DIAGNOSIS — I340 Nonrheumatic mitral (valve) insufficiency: Secondary | ICD-10-CM

## 2017-10-22 LAB — BASIC METABOLIC PANEL
ANION GAP: 6 (ref 5–15)
BUN: 14 mg/dL (ref 8–23)
CALCIUM: 8.5 mg/dL — AB (ref 8.9–10.3)
CO2: 30 mmol/L (ref 22–32)
CREATININE: 0.76 mg/dL (ref 0.44–1.00)
Chloride: 105 mmol/L (ref 98–111)
GLUCOSE: 140 mg/dL — AB (ref 70–99)
Potassium: 3.5 mmol/L (ref 3.5–5.1)
Sodium: 141 mmol/L (ref 135–145)

## 2017-10-22 LAB — ECHOCARDIOGRAM COMPLETE
HEIGHTINCHES: 65 in
WEIGHTICAEL: 2243.2 [oz_av]

## 2017-10-22 LAB — CBC
HCT: 38.9 % (ref 35.0–47.0)
Hemoglobin: 13.3 g/dL (ref 12.0–16.0)
MCH: 30.8 pg (ref 26.0–34.0)
MCHC: 34.3 g/dL (ref 32.0–36.0)
MCV: 89.7 fL (ref 80.0–100.0)
PLATELETS: 211 10*3/uL (ref 150–440)
RBC: 4.33 MIL/uL (ref 3.80–5.20)
RDW: 16.2 % — ABNORMAL HIGH (ref 11.5–14.5)
WBC: 3.5 10*3/uL — ABNORMAL LOW (ref 3.6–11.0)

## 2017-10-22 LAB — LACTIC ACID, PLASMA: Lactic Acid, Venous: 1 mmol/L (ref 0.5–1.9)

## 2017-10-22 LAB — MRSA PCR SCREENING: MRSA by PCR: NEGATIVE

## 2017-10-22 MED ORDER — HYDROCODONE-ACETAMINOPHEN 7.5-325 MG PO TABS: 1.0000 | ORAL_TABLET | Freq: Two times a day (BID) | ORAL | Status: DC | PRN

## 2017-10-22 MED ORDER — ASPIRIN EC 81 MG PO TBEC
81.0000 mg | DELAYED_RELEASE_TABLET | Freq: Every day | ORAL | Status: DC
  Administered 2017-10-22 – 2017-10-27 (×6): 81 mg via ORAL
  Filled 2017-10-22 (×6): qty 1

## 2017-10-22 MED ORDER — METHYLPREDNISOLONE SODIUM SUCC 125 MG IJ SOLR
60.0000 mg | Freq: Four times a day (QID) | INTRAMUSCULAR | Status: DC
  Administered 2017-10-22 – 2017-10-23 (×6): 60 mg via INTRAVENOUS
  Filled 2017-10-22 (×6): qty 2

## 2017-10-22 MED ORDER — ONDANSETRON HCL 4 MG PO TABS: 4.0000 mg | ORAL_TABLET | Freq: Four times a day (QID) | ORAL | Status: DC | PRN

## 2017-10-22 MED ORDER — GABAPENTIN 400 MG PO CAPS
800.0000 mg | ORAL_CAPSULE | Freq: Three times a day (TID) | ORAL | Status: DC
  Administered 2017-10-22 – 2017-10-27 (×16): 800 mg via ORAL
  Filled 2017-10-22 (×7): qty 2
  Filled 2017-10-22: qty 8
  Filled 2017-10-22 (×8): qty 2

## 2017-10-22 MED ORDER — ENOXAPARIN SODIUM 40 MG/0.4ML ~~LOC~~ SOLN
40.0000 mg | SUBCUTANEOUS | Status: DC
  Administered 2017-10-22 – 2017-10-27 (×6): 40 mg via SUBCUTANEOUS
  Filled 2017-10-22 (×6): qty 0.4

## 2017-10-22 MED ORDER — ATORVASTATIN CALCIUM 10 MG PO TABS
10.0000 mg | ORAL_TABLET | Freq: Every day | ORAL | Status: DC
  Administered 2017-10-23 – 2017-10-26 (×4): 10 mg via ORAL
  Filled 2017-10-22 (×6): qty 1

## 2017-10-22 MED ORDER — ALBUTEROL SULFATE (2.5 MG/3ML) 0.083% IN NEBU: 2.5000 mg | INHALATION_SOLUTION | RESPIRATORY_TRACT | Status: DC | PRN

## 2017-10-22 MED ORDER — PHENYTOIN SODIUM EXTENDED 100 MG PO CAPS
200.0000 mg | ORAL_CAPSULE | Freq: Every day | ORAL | Status: DC
  Administered 2017-10-22 – 2017-10-26 (×5): 200 mg via ORAL
  Filled 2017-10-22 (×7): qty 2

## 2017-10-22 MED ORDER — MOMETASONE FURO-FORMOTEROL FUM 100-5 MCG/ACT IN AERO
2.0000 | INHALATION_SPRAY | Freq: Two times a day (BID) | RESPIRATORY_TRACT | Status: DC
  Administered 2017-10-22 – 2017-10-27 (×11): 2 via RESPIRATORY_TRACT
  Filled 2017-10-22 (×2): qty 8.8

## 2017-10-22 MED ORDER — GUAIFENESIN-DM 100-10 MG/5ML PO SYRP
5.0000 mL | ORAL_SOLUTION | ORAL | Status: DC | PRN
  Administered 2017-10-22 – 2017-10-24 (×6): 5 mL via ORAL
  Filled 2017-10-22 (×6): qty 5

## 2017-10-22 MED ORDER — SERTRALINE HCL 50 MG PO TABS
100.0000 mg | ORAL_TABLET | Freq: Every day | ORAL | Status: DC
  Administered 2017-10-22 – 2017-10-27 (×6): 100 mg via ORAL
  Filled 2017-10-22 (×6): qty 2

## 2017-10-22 MED ORDER — PANTOPRAZOLE SODIUM 40 MG PO TBEC
40.0000 mg | DELAYED_RELEASE_TABLET | Freq: Every day | ORAL | Status: DC
  Administered 2017-10-22 – 2017-10-27 (×6): 40 mg via ORAL
  Filled 2017-10-22 (×6): qty 1

## 2017-10-22 MED ORDER — IPRATROPIUM-ALBUTEROL 0.5-2.5 (3) MG/3ML IN SOLN: 3.0000 mL | RESPIRATORY_TRACT | Status: DC | PRN

## 2017-10-22 MED ORDER — AZITHROMYCIN 250 MG PO TABS
500.0000 mg | ORAL_TABLET | Freq: Every day | ORAL | Status: AC
  Administered 2017-10-22 – 2017-10-26 (×5): 500 mg via ORAL
  Filled 2017-10-22 (×5): qty 2

## 2017-10-22 MED ORDER — ACETAMINOPHEN 650 MG RE SUPP: 650.0000 mg | Freq: Four times a day (QID) | RECTAL | Status: DC | PRN

## 2017-10-22 MED ORDER — IPRATROPIUM-ALBUTEROL 0.5-2.5 (3) MG/3ML IN SOLN
3.0000 mL | Freq: Four times a day (QID) | RESPIRATORY_TRACT | Status: DC
  Administered 2017-10-22 – 2017-10-27 (×18): 3 mL via RESPIRATORY_TRACT
  Filled 2017-10-22 (×21): qty 3

## 2017-10-22 MED ORDER — QUETIAPINE FUMARATE 25 MG PO TABS
50.0000 mg | ORAL_TABLET | Freq: Every day | ORAL | Status: DC
  Administered 2017-10-22 – 2017-10-26 (×5): 50 mg via ORAL
  Filled 2017-10-22 (×5): qty 2

## 2017-10-22 MED ORDER — FUROSEMIDE 20 MG PO TABS
10.0000 mg | ORAL_TABLET | Freq: Every day | ORAL | Status: DC
  Administered 2017-10-22 – 2017-10-27 (×6): 10 mg via ORAL
  Filled 2017-10-22 (×6): qty 1

## 2017-10-22 MED ORDER — ACETAMINOPHEN 325 MG PO TABS
650.0000 mg | ORAL_TABLET | Freq: Four times a day (QID) | ORAL | Status: DC | PRN
  Administered 2017-10-26: 650 mg via ORAL
  Filled 2017-10-22: qty 2

## 2017-10-22 MED ORDER — SODIUM CHLORIDE 0.9% FLUSH
3.0000 mL | Freq: Two times a day (BID) | INTRAVENOUS | Status: DC
  Administered 2017-10-22 – 2017-10-26 (×10): 3 mL via INTRAVENOUS

## 2017-10-22 MED ORDER — BUSPIRONE HCL 10 MG PO TABS
10.0000 mg | ORAL_TABLET | Freq: Two times a day (BID) | ORAL | Status: DC
  Administered 2017-10-22 – 2017-10-27 (×11): 10 mg via ORAL
  Filled 2017-10-22 (×13): qty 1

## 2017-10-22 MED ORDER — HYDROCODONE-ACETAMINOPHEN 7.5-325 MG PO TABS
1.0000 | ORAL_TABLET | Freq: Two times a day (BID) | ORAL | Status: DC
  Administered 2017-10-22: 1 via ORAL
  Filled 2017-10-22: qty 1

## 2017-10-22 MED ORDER — ONDANSETRON HCL 4 MG/2ML IJ SOLN: 4.0000 mg | Freq: Four times a day (QID) | INTRAMUSCULAR | Status: DC | PRN

## 2017-10-22 NOTE — Care Management Note (Addendum)
Case Management Note  Patient Details  Name: Sandy Daniel MRN: [id number] Date of Birth: 07/22/1928  Subjective/Objective:                 Patient  admitted from [facility].  Has been a resident for approximately 4 months.  She does not know if she has chronic oxygen. Patient has two sisters but "they can not come and do not know much about me."   CM contacted [facility] and person answering phone says he is a caregiver "right now until the next person gets here."  He is unable to answer any questions about the patient.  Unable to provide CM with phone contact information for the home owner.  CM unable to establish level of functioning prior to arrival nor whether patient is receiving any home health.  patient is a full code- 2 admission and 4 ED presentations in the past 6 months. Patient is not eligible for [facility] per Epic. [facility] staff unable to tell CM if patient if patient has HCPOA. Patient also unable to state  It appears patient was on neb solutions so would anticipate patient has a nebulizer machine in the family care hoe.   Action/Plan\ Updated CSW. Suggested home health nursing and outpatient palliative. Expected Discharge Date:                  Expected Discharge Plan:     In-House Referral:     Discharge planning Services     Post Acute Care Choice:    Choice offered to:     DME Arranged:    DME Agency:     HH Arranged:    HH Agency:     Status of Service:     If discussed at [employer], dates discussed:    Additional Comments:  Eber Hong, RN 10/22/2017, 9:34 AM

## 2017-10-22 NOTE — Progress Notes (Signed)
Family Meeting Note  Advance Directive:yes  Today a meeting took place with the Patient.    The following clinical team members were present during this meeting:MD  The following were discussed:Patient's diagnosis: Acute respiratory failure secondary to COPD exacerbation, CHF exacerbation treatment plan of care discussed in detail with the patient.  She verbalized understanding.  Other comorbidities as documented below are also discussed   Arthritis    . Asthma   . B12 deficiency   . CAD (coronary artery disease)   . COPD (chronic obstructive pulmonary disease) (HCC)   . Depression   . Epilepsy (HCC)   . GERD (gastroesophageal reflux disease)   . Hyperlipidemia   . Hypertension   . Hypoxemia   . Macular degeneration (senile) of retina   . Macular degeneration of both eyes   . MI (myocardial infarction) (HCC) 09/2003, 05/2013  . Physiological tremor   . Presence of stent of CABG    x2   . Seizures (HCC)    patient's progosis: Unable to determine and Goals for treatment: DNR, sister Leonie Douglas is HCPOA  Additional follow-up to be provided: HOSPITALIST AND CARDIO  Time spent during discussion:17 MIN  Ramonita Lab, MD

## 2017-10-22 NOTE — Plan of Care (Signed)
  Problem: Pain Managment: Goal: General experience of comfort will improve Outcome: Progressing   Problem: Safety: Goal: Ability to remain free from injury will improve Outcome: Progressing   Problem: Clinical Measurements: Goal: Respiratory complications will improve Outcome: Progressing   

## 2017-10-22 NOTE — Progress Notes (Signed)
Chaplain responded to an OR for an AD. Pt is hard of hearing on her left side and is feeling lonely. Chaplain asked Pt if she understood wha an AD was and she said she did and told Chaplain it was what se wanted to happen before death. She said she didn't want to be hooked to machine. She said she is unable to read or fill out form. She feels she don't have anyone to help her. She miss her youngest sister Leonie Douglas) . She fells deserted by her and know her other sibling are not able to support her. Chaplain attempted to reach her sister by [employer] and call the number found [phone]. Chaplain called and did not get an answer. Chaplain will continue to reach sister and follow up with Pt.    10/22/17 1000  Clinical Encounter Type  Visited With Patient  Visit Type Initial  Referral From Nurse  Spiritual Encounters  Spiritual Needs Brochure;Prayer

## 2017-10-22 NOTE — Progress Notes (Signed)
[facility] Physicians - [place] at [facility]   PATIENT NAME: Sandy Daniel    MR#:  [id number]  DATE OF BIRTH:  1928-09-07  SUBJECTIVE:  CHIEF COMPLAINT: Patient is coughing intermittently and shortness of breath is somewhat better  REVIEW OF SYSTEMS:  CONSTITUTIONAL: No fever, fatigue or weakness.  EYES: No blurred or double vision.  EARS, NOSE, AND THROAT: No tinnitus or ear pain.  RESPIRATORY: Reports intermittent episodes of cough, exertional shortness of breath, denies wheezing or hemoptysis.  CARDIOVASCULAR: No chest pain, orthopnea, edema.  GASTROINTESTINAL: No nausea, vomiting, diarrhea or abdominal pain.  GENITOURINARY: No dysuria, hematuria.  ENDOCRINE: No polyuria, nocturia,  HEMATOLOGY: No anemia, easy bruising or bleeding SKIN: No rash or lesion. MUSCULOSKELETAL: No joint pain or arthritis.   NEUROLOGIC: No tingling, numbness, weakness.  PSYCHIATRY: No anxiety or depression.   DRUG ALLERGIES:   Allergies  Allergen Reactions  . Motrin [Ibuprofen] Shortness Of Breath and Swelling    Swelling of lip/tongue  . Tolmetin Rash  . Alendronate     Other reaction(s): Nausea And Vomiting Other reaction(s): Nausea And Vomiting  . Other Rash    Any Steriods. Causes mouth sores  . Tramadol Hcl Nausea And Vomiting    VITALS:  Blood pressure (!) 155/63, pulse (!) 56, temperature 98.2 F (36.8 C), temperature source Oral, resp. rate 16, height 5\' 5"  (1.651 m), weight 63.6 kg, SpO2 93 %.  PHYSICAL EXAMINATION:  GENERAL:  84 y.o.-year-old patient lying in the bed with no acute distress.  EYES: Pupils equal, round, reactive to light and accommodation. No scleral icterus. Extraocular muscles intact.  HEENT: Head atraumatic, normocephalic. Oropharynx and nasopharynx clear.  NECK:  Supple, no jugular venous distention. No thyroid enlargement, no tenderness.  LUNGS: Diminished breath sounds bilaterally, no wheezing, positive rales,rhonchi ,no crepitation. No  use of accessory muscles of respiration.  CARDIOVASCULAR: S1, S2 normal. No murmurs, rubs, or gallops.  ABDOMEN: Soft, nontender, nondistended. Bowel sounds present. No organomegaly or mass.  EXTREMITIES: No pedal edema, cyanosis, or clubbing.  NEUROLOGIC: Cranial nerves II through XII are intact.Sensation intact. Gait not checked.  PSYCHIATRIC: The patient is alert and oriented x 3.  SKIN: No obvious rash, lesion, or ulcer.    LABORATORY PANEL:   CBC Recent Labs  Lab 10/22/17 0125  WBC 3.5*  HGB 13.3  HCT 38.9  PLT 211   ------------------------------------------------------------------------------------------------------------------  Chemistries  Recent Labs  Lab 10/22/17 0125  NA 141  K 3.5  CL 105  CO2 30  GLUCOSE 140*  BUN 14  CREATININE 0.76  CALCIUM 8.5*   ------------------------------------------------------------------------------------------------------------------  Cardiac Enzymes No results for input(s): TROPONINI in the last 168 hours. ------------------------------------------------------------------------------------------------------------------  RADIOLOGY:  Dg Chest 1 View  Result Date: 10/21/2017 CLINICAL DATA:  Short of breath EXAM: CHEST  1 VIEW COMPARISON:  09/07/2017 FINDINGS: Cardiomegaly without overt edema. Linear scarring or atelectasis within the left mid and lower lung. Aortic atherosclerosis. No pneumothorax. IMPRESSION: No active disease. Cardiomegaly with stable scarring in the left mid and lower lung. Electronically Signed   By: Jasmine Pang M.D.   On: 10/21/2017 21:38    EKG:   Orders placed or performed during the hospital encounter of 10/21/17  . EKG 12-Lead  . EKG 12-Lead  . ED EKG  . ED EKG    ASSESSMENT AND PLAN:   Acute respiratory failure secondary to COPD exacerbation Currently on 3 L of oxygen wean off as tolerated Continue IV Solu-Medrol and wean off Duo nebs every 6 hours  and albuterol as  needed Azithromycin Supportive measures and as needed cough meds   History of congestive heart failure with preserved ejection fraction no significant pulmonary edema 1 dose IV Lasix was given BNP is somewhat elevated  echocardiogram  Coronary artery disease-continue home medication aspirin, Lipitor  Seizure disorder-continue home medication Dilantin  GERD PPI  Hyperlipidemia continue statin   All the records are reviewed and case discussed with Care Management/Social Workerr. Management plans discussed with the patient, family and they are in agreement.  CODE STATUS: DNR  TOTAL TIME TAKING CARE OF THIS PATIENT: 35  minutes.   POSSIBLE D/C IN 2 DAYS, DEPENDING ON CLINICAL CONDITION.  Note: This dictation was prepared with Dragon dictation along with smaller phrase technology. Any transcriptional errors that result from this process are unintentional.   Ramonita Lab M.D on 10/22/2017 at 10:03 AM  Between 7am to 6pm - Pager - [phone] After 6pm go to [email or web address] - [email or web address] [facility]  [place] [facility]  Office  [phone]  CC: Primary care physician; Galen Manila, NP

## 2017-10-22 NOTE — Progress Notes (Signed)
*  PRELIMINARY RESULTS* Echocardiogram 2D Echocardiogram has been performed.  Sandy Daniel 10/22/2017, 11:35 AM

## 2017-10-22 NOTE — Progress Notes (Signed)
Patient resting in the bed, denies any at this time , intermittent  Productive coughing PRN coughing  PRN medication administer, will continue to monitor

## 2017-10-23 MED ORDER — METHYLPREDNISOLONE SODIUM SUCC 40 MG IJ SOLR
40.0000 mg | Freq: Three times a day (TID) | INTRAMUSCULAR | Status: DC
  Administered 2017-10-23 – 2017-10-24 (×3): 40 mg via INTRAVENOUS
  Filled 2017-10-23 (×3): qty 1

## 2017-10-23 MED ORDER — SENNOSIDES-DOCUSATE SODIUM 8.6-50 MG PO TABS
1.0000 | ORAL_TABLET | Freq: Every day | ORAL | Status: DC
  Administered 2017-10-23 – 2017-10-25 (×3): 1 via ORAL
  Filled 2017-10-23 (×4): qty 1

## 2017-10-23 NOTE — Clinical Social Work Note (Addendum)
CSW contacted [facility] and spoke to Flint Melter [phone] family care home manager, to discuss patient.  Jasmine December reported that patient does not wear oxygen at home, and also is normally quite independent in home.  Family care home does not have a preference for home health agencies, CSW notified case manager.  Formal assessment to follow, Jasmine December is main contact for patient to facilitate discharge planning.  Ervin Knack. Xochil Shanker, MSW, Theresia Majors [phone]  10/23/2017 5:13 PM

## 2017-10-23 NOTE — Progress Notes (Signed)
Assisted patient out of bed to chair using a walker, and presently sitting in the recliner chair, alert and oriented, denies any pain  at this time. Attempted to weaned patient of oxygen was unsuccessful, sat has drop to 87%room air

## 2017-10-23 NOTE — Consult Note (Signed)
[facility] Cardiology Consultation Note  Patient ID: Sandy Daniel, MRN: [id number], DOB/AGE: [birth year] 82 y.o. Admit date: 10/21/2017   Date of Consult: 10/23/2017 Primary Physician: Galen Manila, NP Primary Cardiologist: None  Chief Complaint:  Chief Complaint  Patient presents with  . Shortness of Breath   Reason for Consult: Cardial effusion  HPI: 84 y.o. female with known coronary disease and peripheral vascular disease essential hypertension mixed hyperlipidemia having acute bronchitis and COPD exacerbation with some hypoxia and congestive heart failure symptoms.  By chest x-ray there was no evidence of pneumonia and/or pulmonary edema although the patient has had some improvements with furosemide.  The patient has had a BNP of 281 insistent with some heart failure possibly caused by hypoxia.  The patient has not had any evidence of chest discomfort with an EKG currently showing normal sinus rhythm with left anterior fascicular block.  Echocardiogram has been performed showing normal LV systolic function and small to moderate pericardial effusion.  This may be secondary to current hypoxia and other possible diastolic dysfunction heart failure.  Currently the patient has had improvements with supportive therapy as below.  There is no current evidence of clinical concerns of this pericardial effusion at this time  Past Medical History:  Diagnosis Date  . Arthritis   . Asthma   . B12 deficiency   . CAD (coronary artery disease)   . COPD (chronic obstructive pulmonary disease) (HCC)   . Depression   . Epilepsy (HCC)   . GERD (gastroesophageal reflux disease)   . Hyperlipidemia   . Hypertension   . Hypoxemia   . Macular degeneration (senile) of retina   . Macular degeneration of both eyes   . MI (myocardial infarction) (HCC) 09/2003, 05/2013  . Physiological tremor   . Presence of stent of CABG    x2   . Seizures (HCC)       Surgical History:  Past Surgical  History:  Procedure Laterality Date  . ABDOMINAL HYSTERECTOMY    . APPENDECTOMY    . CARDIAC CATHETERIZATION  05/2013  . CARDIAC SURGERY    . CATARACT EXTRACTION  03/2016  . CHOLECYSTECTOMY  1962  . CORONARY ANGIOPLASTY WITH STENT PLACEMENT  Aug. 2005 and March 2015  . ROTATOR CUFF REPAIR Right   . VENTRAL HERNIA REPAIR  2002     Home Meds: Prior to Admission medications   Medication Sig Start Date End Date Taking? Authorizing Provider  aspirin 81 MG tablet Take 1 tablet (81 mg total) by mouth daily. 06/07/17  Yes Dionne Bucy, MD  atorvastatin (LIPITOR) 10 MG tablet Take 1 tablet (10 mg total) by mouth daily. 08/13/17  Yes Galen Manila, NP  busPIRone (BUSPAR) 10 MG tablet Take 1 tablet (10 mg total) by mouth 2 (two) times daily. 08/13/17  Yes Galen Manila, NP  Fluticasone-Salmeterol (ADVAIR) 100-50 MCG/DOSE AEPB Inhale 1 puff into the lungs 2 (two) times daily. 08/13/17  Yes Galen Manila, NP  furosemide (LASIX) 20 MG tablet Take 0.5 tablets (10 mg total) by mouth daily. 08/13/17  Yes Galen Manila, NP  gabapentin (NEURONTIN) 400 MG capsule Take 2 capsules (800 mg total) by mouth 3 (three) times daily. 08/13/17  Yes Galen Manila, NP  pantoprazole (PROTONIX) 40 MG tablet Take 1 tablet (40 mg total) by mouth daily. 08/13/17  Yes Galen Manila, NP  phenytoin (DILANTIN) 100 MG ER capsule TAKE 2 CAPSULES (200MG ) BY MOUTH AT BEDTIME 09/20/17  Yes Galen Manila, NP  QUEtiapine (SEROQUEL) 50 MG tablet Take 1 tablet (50 mg total) by mouth at bedtime. 08/13/17  Yes Galen Manila, NP  sertraline (ZOLOFT) 100 MG tablet Take 1 tablet (100 mg total) by mouth daily. 08/13/17  Yes Galen Manila, NP  albuterol (PROVENTIL HFA;VENTOLIN HFA) 108 (90 Base) MCG/ACT inhaler Inhale 2 puffs into the lungs every 6 (six) hours as needed for wheezing or shortness of breath. 08/13/17   Galen Manila, NP  albuterol (PROVENTIL) (2.5 MG/3ML) 0.083%  nebulizer solution Take 3 mLs (2.5 mg total) by nebulization every 6 (six) hours as needed for Wheezing. 08/13/17   Galen Manila, NP  guaiFENesin-dextromethorphan (ROBITUSSIN DM) 100-10 MG/5ML syrup Take 15 mLs by mouth every 6 (six) hours as needed for cough. 09/08/17   Auburn Bilberry, MD  HYDROcodone-acetaminophen (NORCO) 7.5-325 MG tablet Take 1 tablet by mouth 2 (two) times daily as needed for moderate pain. For chronic pain. To fill on or after 09/18/2017, 10/18/2017, 11/16/2017 09/17/17   Edward Jolly, MD  Lidocaine 0.5 % GEL Apply 1 application topically 3 (three) times daily. Patient not taking: Reported on 10/22/2017 07/12/17   Enid Derry, PA-C  magnesium hydroxide (MILK OF MAGNESIA) 400 MG/5ML suspension Take 30 mLs by mouth daily as needed for mild constipation. Patient not taking: Reported on 10/22/2017 09/08/17 09/08/18  Auburn Bilberry, MD  nitroGLYCERIN (NITROSTAT) 0.4 MG SL tablet Place 1 tablet (0.4 mg total) under the tongue every 5 (five) minutes as needed for chest pain. 06/12/17   Galen Manila, NP    Inpatient Medications:  . aspirin EC  81 mg Oral Daily  . atorvastatin  10 mg Oral Daily  . azithromycin  500 mg Oral Daily  . busPIRone  10 mg Oral BID  . enoxaparin (LOVENOX) injection  40 mg Subcutaneous Q24H  . furosemide  10 mg Oral Daily  . gabapentin  800 mg Oral TID  . ipratropium-albuterol  3 mL Nebulization Q6H  . methylPREDNISolone (SOLU-MEDROL) injection  40 mg Intravenous Q8H  . mometasone-formoterol  2 puff Inhalation BID  . pantoprazole  40 mg Oral Daily  . phenytoin  200 mg Oral QHS  . QUEtiapine  50 mg Oral QHS  . sertraline  100 mg Oral Daily  . sodium chloride flush  3 mL Intravenous Q12H     Allergies:  Allergies  Allergen Reactions  . Motrin [Ibuprofen] Shortness Of Breath and Swelling    Swelling of lip/tongue  . Tolmetin Rash  . Alendronate     Other reaction(s): Nausea And Vomiting Other reaction(s): Nausea And Vomiting  . Other  Rash    Any Steriods. Causes mouth sores  . Tramadol Hcl Nausea And Vomiting    Social History   Socioeconomic History  . Marital status: Widowed    Spouse name: Not on file  . Number of children: Not on file  . Years of education: 6  . Highest education level: Not on file  Occupational History  . Not on file  Social Needs  . Financial resource strain: Not hard at all  . Food insecurity:    Worry: Never true    Inability: Never true  . Transportation needs:    Medical: No    Non-medical: No  Tobacco Use  . Smoking status: Current Every Day Smoker    Packs/day: 1.00    Years: 50.00    Pack years: 50.00    Types: Cigarettes  . Smokeless tobacco: Never Used  . Tobacco comment: 10 a day  Substance and Sexual Activity  . Alcohol use: No  . Drug use: No  . Sexual activity: Never  Lifestyle  . Physical activity:    Days per week: 0 days    Minutes per session: 0 min  . Stress: Not at all  Relationships  . Social connections:    Talks on phone: Once a week    Gets together: Never    Attends religious service: Never    Active member of club or organization: No    Attends meetings of clubs or organizations: Never    Relationship status: Widowed  . Intimate partner violence:    Fear of current or ex partner: No    Emotionally abused: No    Physically abused: No    Forced sexual activity: No  Other Topics Concern  . Not on file  Social History Narrative  . Not on file     Family History  Problem Relation Age of Onset  . Diabetes Mother   . Cancer Sister   . Diabetes Sister   . Heart disease Sister   . Heart attack Sister   . Hyperlipidemia Sister   . Hypertension Sister   . Cancer Brother   . Diabetes Brother   . Heart disease Brother   . Heart attack Brother   . Hyperlipidemia Brother   . Hypertension Brother   . Diabetes Brother   . Diabetes Brother   . Kidney failure Brother      Review of Systems Positive for cough congestion Negative  for: General:  chills, fever, night sweats or weight changes.  Cardiovascular: PND orthopnea syncope dizziness  Dermatological skin lesions rashes Respiratory: Positive for cough congestion Urologic: Frequent urination urination at night and hematuria Abdominal: negative for nausea, vomiting, diarrhea, bright red blood per rectum, melena, or hematemesis Neurologic: negative for visual changes, and/or hearing changes  All other systems reviewed and are otherwise negative except as noted above.  Labs: No results for input(s): CKTOTAL, CKMB, TROPONINI in the last 72 hours. Lab Results  Component Value Date   WBC 3.5 (L) 10/22/2017   HGB 13.3 10/22/2017   HCT 38.9 10/22/2017   MCV 89.7 10/22/2017   PLT 211 10/22/2017    Recent Labs  Lab 10/22/17 0125  NA 141  K 3.5  CL 105  CO2 30  BUN 14  CREATININE 0.76  CALCIUM 8.5*  GLUCOSE 140*   Lab Results  Component Value Date   CHOL 126 09/08/2017   HDL 45 09/08/2017   LDLCALC 68 09/08/2017   TRIG 63 09/08/2017   No results found for: DDIMER  Radiology/Studies:  Dg Chest 1 View  Result Date: 10/21/2017 CLINICAL DATA:  Short of breath EXAM: CHEST  1 VIEW COMPARISON:  09/07/2017 FINDINGS: Cardiomegaly without overt edema. Linear scarring or atelectasis within the left mid and lower lung. Aortic atherosclerosis. No pneumothorax. IMPRESSION: No active disease. Cardiomegaly with stable scarring in the left mid and lower lung. Electronically Signed   By: Jasmine Pang M.D.   On: 10/21/2017 21:38    EKG: Normal sinus rhythm with left anterior fascicular block  Weights: Filed Weights   10/21/17 2101 10/22/17 0106 10/23/17 0405  Weight: 68 kg 63.6 kg 64.3 kg     Physical Exam: Blood pressure (!) 141/54, pulse (!) 59, temperature 97.9 F (36.6 C), resp. rate 18, height 5\' 5"  (1.651 m), weight 64.3 kg, SpO2 94 %. Body mass index is 23.6 kg/m. General: Well developed, well nourished, in no acute distress. Head  eyes ears nose  throat: Normocephalic, atraumatic, sclera non-icteric, no xanthomas, nares are without discharge. No apparent thyromegaly and/or mass  Lungs: Normal respiratory effort.  Some wheezes, no rales, diffuse rhonchi.  Heart: RRR with normal S1 S2. no murmur gallop, no rub, PMI is normal size and placement, carotid upstroke normal without bruit, jugular venous pressure is normal Abdomen: Soft, non-tender, non-distended with normoactive bowel sounds. No hepatomegaly. No rebound/guarding. No obvious abdominal masses. Abdominal aorta is normal size without bruit Extremities: Trace edema. no cyanosis, no clubbing, no ulcers  Peripheral : 2+ bilateral upper extremity pulses, 2+ bilateral femoral pulses, 2+ bilateral dorsal pedal pulse Neuro: Alert and oriented. No facial asymmetry. No focal deficit. Moves all extremities spontaneously. Musculoskeletal: Normal muscle tone without kyphosis Psych:  Responds to questions appropriately with a normal affect.    Assessment: 84 year old female with acute on chronic obstructive pulmonary disease exacerbation and bronchitis with peripheral vascular disease coronary artery disease essential hypertension mixed hyperlipidemia without evidence of myocardial infarction having a nonsignificant pericardial effusion likely secondary to above  Plan: 1.  Continue supportive care of acute on chronic exacerbation of COPD with antibiotics and inhalers 2.  Continue furosemide for edema and pericardial effusion not significant at this time 3.  Antihypertensive medication management as able 4.  High intensity cholesterol therapy for cardiovascular disease 5.  Continue to ambulate and follow for improvements of symptoms but no further cardiac diagnostics necessary at this time 6.  Follow-up in clinic in 1 to 2 weeks after recovery from above for further assessment of pericardial effusion  Signed, Lamar Blinks M.D. [facility] Cardiology 10/23/2017, 6:48 PM

## 2017-10-23 NOTE — Progress Notes (Signed)
[facility] [place] [facility]   PATIENT NAME: Sandy Daniel    MR#:  [id number]  DATE OF BIRTH:  Jan 02, 1929  SUBJECTIVE:  CHIEF COMPLAINT: Patient is coughing intermittently and shortness of breath is better  REVIEW OF SYSTEMS:  CONSTITUTIONAL: No fever, fatigue or weakness.  EYES: No blurred or double vision.  EARS, NOSE, AND THROAT: No tinnitus or ear pain.  RESPIRATORY: Reports intermittent episodes of cough, exertional shortness of breath, denies wheezing or hemoptysis.  CARDIOVASCULAR: No chest pain, orthopnea, edema.  GASTROINTESTINAL: No nausea, vomiting, diarrhea or abdominal pain.  GENITOURINARY: No dysuria, hematuria.  ENDOCRINE: No polyuria, nocturia,  HEMATOLOGY: No anemia, easy bruising or bleeding SKIN: No rash or lesion. MUSCULOSKELETAL: No joint pain or arthritis.   NEUROLOGIC: No tingling, numbness, weakness.  PSYCHIATRY: No anxiety or depression.   DRUG ALLERGIES:   Allergies  Allergen Reactions  . Motrin [Ibuprofen] Shortness Of Breath and Swelling    Swelling of lip/tongue  . Tolmetin Rash  . Alendronate     Other reaction(s): Nausea And Vomiting Other reaction(s): Nausea And Vomiting  . Other Rash    Any Steriods. Causes mouth sores  . Tramadol Hcl Nausea And Vomiting    VITALS:  Blood pressure (!) 133/50, pulse (!) 59, temperature 97.9 F (36.6 C), resp. rate 18, height 5\' 5"  (1.651 m), weight 64.3 kg, SpO2 94 %.  PHYSICAL EXAMINATION:  GENERAL:  84 y.o.-year-old patient lying in the bed with no acute distress.  EYES: Pupils equal, round, reactive to light and accommodation. No scleral icterus. Extraocular muscles intact.  HEENT: Head atraumatic, normocephalic. Oropharynx and nasopharynx clear.  NECK:  Supple, no jugular venous distention. No thyroid enlargement, no tenderness.  LUNGS: Diminished breath sounds bilaterally, no wheezing, positive rales,rhonchi ,no crepitation. No use of accessory muscles of  respiration.  CARDIOVASCULAR: S1, S2 normal. No murmurs, rubs, or gallops.  ABDOMEN: Soft, nontender, nondistended. Bowel sounds present. No organomegaly or mass.  EXTREMITIES: No pedal edema, cyanosis, or clubbing.  NEUROLOGIC: Cranial nerves II through XII are intact.Sensation intact. Gait not checked.  PSYCHIATRIC: The patient is alert and oriented x 3.  SKIN: No obvious rash, lesion, or ulcer.    LABORATORY PANEL:   CBC Recent Labs  Lab 10/22/17 0125  WBC 3.5*  HGB 13.3  HCT 38.9  PLT 211   ------------------------------------------------------------------------------------------------------------------  Chemistries  Recent Labs  Lab 10/22/17 0125  NA 141  K 3.5  CL 105  CO2 30  GLUCOSE 140*  BUN 14  CREATININE 0.76  CALCIUM 8.5*   ------------------------------------------------------------------------------------------------------------------  Cardiac Enzymes No results for input(s): TROPONINI in the last 168 hours. ------------------------------------------------------------------------------------------------------------------  RADIOLOGY:  Dg Chest 1 View  Result Date: 10/21/2017 CLINICAL DATA:  Short of breath EXAM: CHEST  1 VIEW COMPARISON:  09/07/2017 FINDINGS: Cardiomegaly without overt edema. Linear scarring or atelectasis within the left mid and lower lung. Aortic atherosclerosis. No pneumothorax. IMPRESSION: No active disease. Cardiomegaly with stable scarring in the left mid and lower lung. Electronically Signed   By: Jasmine Pang M.D.   On: 10/21/2017 21:38    EKG:   Orders placed or performed during the hospital encounter of 10/21/17  . EKG 12-Lead  . EKG 12-Lead  . ED EKG  . ED EKG    ASSESSMENT AND PLAN:   Acute respiratory failure secondary to COPD exacerbation Clinically getting better Currently on 3 L of oxygen wean off as tolerated Continue IV Solu-Medrol and wean off Duo nebs every 6 hours and  albuterol as  needed Azithromycin Supportive measures and as needed cough meds   History of congestive heart failure with preserved ejection fraction no significant pulmonary edema 1 dose IV Lasix was given BNP is somewhat elevated  echocardiogram-cavity site was normal.  Moderate pericardial effusion was identified, will consult cardiology  Coronary artery disease-continue home medication aspirin, Lipitor  Seizure disorder-continue home medication Dilantin  GERD PPI  Hyperlipidemia continue statin   All the records are reviewed and case discussed with Care Management/Social Workerr. Management plans discussed with the patient, family and they are in agreement.  CODE STATUS: DNR  TOTAL TIME TAKING CARE OF THIS PATIENT: 35  minutes.   POSSIBLE D/C IN 2 DAYS, DEPENDING ON CLINICAL CONDITION.  Note: This dictation was prepared with Dragon dictation along with smaller phrase technology. Any transcriptional errors that result from this process are unintentional.   Ramonita Lab M.D on 10/23/2017 at 3:35 PM  Between 7am to 6pm - Pager - [phone] After 6pm go to [email or web address] - password [email or web address] [facility]  [facility]  Office  [phone]  CC: Primary care physician; Galen Manila, NP

## 2017-10-23 NOTE — Progress Notes (Signed)
Patient found with [place] off SAT at 87%.  placed back on patient and breathing treatment given. SAT currently at 92% Will continue to monitor.

## 2017-10-23 NOTE — [place] FL2 (Addendum)
[place] MEDICAID FL2 LEVEL OF CARE SCREENING TOOL     IDENTIFICATION  Patient Name: Sandy Daniel Birthdate: 03/01/1928 Sex: female Admission Date (Current Location): 10/21/2017   [place] Number:  Chiropodist and Address:  [facility], [address] [place], [place] [address]      Provider Number: [id number]  Attending Physician Name and Address:  Ramonita Lab, MD  Relative Name and Phone Number:  Sharrell Ku [phone]     Current Level of Care: Hospital Recommended Level of Care: Family Care Home Prior Approval Number:    Date Approved/Denied:   PASRR Number:    Discharge Plan: Domiciliary (Rest home)([facility])    Current Diagnoses: Patient Active Problem List   Diagnosis Date Noted  . COPD with acute exacerbation (HCC) 10/21/2017  . Cervical spondylosis without myelopathy 09/17/2017  . Disability of walking 09/17/2017  . Chronic pain syndrome 09/17/2017  . DDD (degenerative disc disease), cervical 09/17/2017  . Chest pain 09/07/2017  . 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 Major depressive disorder with single episode, in partial remission (HCC) 05/15/2017  . Neck pain 01/31/2017  . Chronic right shoulder pain 01/31/2017  . Chronic bilateral thoracic back pain 12/04/2016  . Muscle spasm of back 10/22/2016  . (HFpEF) heart failure with preserved ejection fraction (HCC) 09/11/2016  . Anxiety 07/16/2016  . Chronic bilateral low back pain 07/16/2016  . Unstable gait 07/16/2016  . Controlled substance agreement signed 03/27/2016  . Dysphagia 03/26/2016  . Panic disorder 03/26/2016  . CAD (coronary artery disease)   . Seizure disorder (HCC)   . Macular degeneration of both eyes   . B12 deficiency   . COPD (chronic obstructive pulmonary disease) (HCC)   . HTN (hypertension)   . Hyperlipidemia   . Arthritis   . Depression   . GERD (gastroesophageal reflux  disease)   . Physiological tremor 02/18/2014    Orientation RESPIRATION BLADDER Height & Weight     Self, Situation, Place, Time  O2(3L) Incontinent Weight: 141 lb 12.8 oz (64.3 kg) Height:  5\' 5"  (165.1 cm)  BEHAVIORAL SYMPTOMS/MOOD NEUROLOGICAL BOWEL NUTRITION STATUS      Continent Diet(Cardiac)  AMBULATORY STATUS COMMUNICATION OF NEEDS Skin   Supervision Verbally Normal                       Personal Care Assistance Level of Assistance  Feeding, Dressing, Bathing Bathing Assistance: Limited assistance Feeding assistance: Independent Dressing Assistance: Limited assistance     Functional Limitations Info  Sight, Hearing, Speech Sight Info: Adequate Hearing Info: Impaired Speech Info: Adequate    SPECIAL CARE FACTORS FREQUENCY        PT Frequency: home health 2x a week              Contractures Contractures Info: Not present    Additional Factors Info  Code Status, Allergies, Psychotropic Code Status Info: DNR Allergies Info: MOTRIN IBUPROFEN, TOLMETIN, ALENDRONATE, OTHER, TRAMADOL HCL  Psychotropic Info: busPIRone (BUSPAR) tablet 10 mg QUEtiapine (SEROQUEL) tablet 50 mg         Current Medications (10/23/2017):  This is the current hospital active medication list Current Facility-Administered Medications  Medication Dose Route Frequency Provider Last Rate Last Dose  . acetaminophen (TYLENOL) tablet 650 mg  650 mg Oral Q6H PRN Oralia Manis, MD       Or  . acetaminophen (TYLENOL) suppository 650 mg  650 mg Rectal Q6H PRN  Oralia Manis, MD      . albuterol (PROVENTIL) (2.5 MG/3ML) 0.083% nebulizer solution 2.5 mg  2.5 mg Nebulization Q4H PRN Gouru, Aruna, MD      . aspirin EC tablet 81 mg  81 mg Oral Daily Oralia Manis, MD   81 mg at 10/23/17 0925  . atorvastatin (LIPITOR) tablet 10 mg  10 mg Oral Daily Oralia Manis, MD   10 mg at 10/23/17 1743  . azithromycin (ZITHROMAX) tablet 500 mg  500 mg Oral Daily Oralia Manis, MD   500 mg at 10/23/17 0925  .  busPIRone (BUSPAR) tablet 10 mg  10 mg Oral BID Oralia Manis, MD   10 mg at 10/23/17 0925  . enoxaparin (LOVENOX) injection 40 mg  40 mg Subcutaneous Q24H Oralia Manis, MD   40 mg at 10/23/17 0410  . furosemide (LASIX) tablet 10 mg  10 mg Oral Daily Oralia Manis, MD   10 mg at 10/23/17 0925  . gabapentin (NEURONTIN) capsule 800 mg  800 mg Oral TID Oralia Manis, MD   800 mg at 10/23/17 1743  . guaiFENesin-dextromethorphan (ROBITUSSIN DM) 100-10 MG/5ML syrup 5 mL  5 mL Oral Q4H PRN Gouru, Aruna, MD   5 mL at 10/23/17 1743  . HYDROcodone-acetaminophen (NORCO) 7.5-325 MG per tablet 1 tablet  1 tablet Oral BID PRN Gouru, Aruna, MD      . ipratropium-albuterol (DUONEB) 0.5-2.5 (3) MG/3ML nebulizer solution 3 mL  3 mL Nebulization Q6H Gouru, Aruna, MD   3 mL at 10/23/17 1419  . methylPREDNISolone sodium succinate (SOLU-MEDROL) 40 mg/mL injection 40 mg  40 mg Intravenous Q8H Gouru, Aruna, MD   40 mg at 10/23/17 1743  . mometasone-formoterol (DULERA) 100-5 MCG/ACT inhaler 2 puff  2 puff Inhalation BID Oralia Manis, MD   2 puff at 10/23/17 0926  . ondansetron (ZOFRAN) tablet 4 mg  4 mg Oral Q6H PRN Oralia Manis, MD       Or  . ondansetron [facility]) injection 4 mg  4 mg Intravenous Q6H PRN Oralia Manis, MD      . pantoprazole (PROTONIX) EC tablet 40 mg  40 mg Oral Daily Oralia Manis, MD   40 mg at 10/23/17 0925  . phenytoin (DILANTIN) ER capsule 200 mg  200 mg Oral Lamont Snowball, MD   200 mg at 10/22/17 2234  . QUEtiapine (SEROQUEL) tablet 50 mg  50 mg Oral Lamont Snowball, MD   50 mg at 10/22/17 2234  . sertraline (ZOLOFT) tablet 100 mg  100 mg Oral Daily Oralia Manis, MD   100 mg at 10/23/17 0924  . sodium chloride flush (NS) 0.9 % injection 3 mL  3 mL Intravenous Q12H Gouru, Aruna, MD   3 mL at 10/23/17 0926     Discharge Medications: Medication List        STOP taking these medications       Lidocaine 0.5 % Gel   magnesium hydroxide 400 MG/5ML suspension Commonly known as:  MILK  OF MAGNESIA             TAKE these medications       albuterol 108 (90 Base) MCG/ACT inhaler Commonly known as:  PROVENTIL HFA;VENTOLIN HFA Inhale 2 puffs into the lungs every 6 (six) hours as needed for wheezing or shortness of breath.   albuterol (2.5 MG/3ML) 0.083% nebulizer solution Commonly known as:  PROVENTIL Take 3 mLs (2.5 mg total) by nebulization every 6 (six) hours as needed for Wheezing.   aspirin 81 MG  tablet Take 1 tablet (81 mg total) by mouth daily.   atorvastatin 10 MG tablet Commonly known as:  LIPITOR Take 1 tablet (10 mg total) by mouth daily.   busPIRone 10 MG tablet Commonly known as:  BUSPAR Take 1 tablet (10 mg total) by mouth 2 (two) times daily.   cefdinir 300 MG capsule Commonly known as:  OMNICEF Take 1 capsule (300 mg total) by mouth every 12 (twelve) hours. Start taking on:  10/28/2017   Fluticasone-Salmeterol 100-50 MCG/DOSE Aepb Commonly known as:  ADVAIR Inhale 1 puff into the lungs 2 (two) times daily.   furosemide 20 MG tablet Commonly known as:  LASIX Take 0.5 tablets (10 mg total) by mouth daily.   gabapentin 400 MG capsule Commonly known as:  NEURONTIN Take 2 capsules (800 mg total) by mouth 3 (three) times daily.   guaiFENesin-dextromethorphan 100-10 MG/5ML syrup Commonly known as:  ROBITUSSIN DM Take 15 mLs by mouth every 6 (six) hours as needed for cough.   HYDROcodone-acetaminophen 7.5-325 MG tablet Commonly known as:  NORCO Take 1 tablet by mouth 2 (two) times daily as needed for moderate pain. For chronic pain. To fill on or after 09/18/2017, 10/18/2017, 11/16/2017   nitroGLYCERIN 0.4 MG SL tablet Commonly known as:  NITROSTAT Place 1 tablet (0.4 mg total) under the tongue every 5 (five) minutes as needed for chest pain.   pantoprazole 40 MG tablet Commonly known as:  PROTONIX Take 1 tablet (40 mg total) by mouth daily.   phenytoin 100 MG ER capsule Commonly known as:  DILANTIN TAKE 2 CAPSULES (200MG )  BY MOUTH AT BEDTIME   predniSONE 10 MG tablet Commonly known as:  DELTASONE Take 50 mg daily---taper by 10 mg daily then stop Start taking on:  10/28/2017   QUEtiapine 50 MG tablet Commonly known as:  SEROQUEL Take 1 tablet (50 mg total) by mouth at bedtime.   sertraline 100 MG tablet Commonly known as:  ZOLOFT Take 1 tablet (100 mg total) by mouth daily.    .  Relevant Imaging Results:  Relevant Lab Results:   Additional Information SSN: [id number]  [place] Constable

## 2017-10-23 NOTE — Care Management (Signed)
Found that patient does have a home nebulizer at her family care home but is not on chronic oxygen.  She is not on chronic oxygen and ambulatory.  Agency preference for home with is [facility] but agency is not accepting nursing referral at present due to staffing.  Referral called to and accepted by [facility].  Discussed need for home oxygen assessment prior to discharge

## 2017-10-23 NOTE — Plan of Care (Signed)
  Problem: Activity: Goal: Risk for activity intolerance will decrease Outcome: Progressing   Problem: Clinical Measurements: Goal: Cardiovascular complication will be avoided Outcome: Progressing

## 2017-10-23 NOTE — Plan of Care (Signed)
Educated patient about seizure precautions. Patient has refused to utilize the seizure pads. Encourage patient to perform deep coughing. Patient is congested.

## 2017-10-23 NOTE — Clinical Social Work Note (Signed)
Clinical Social Work Assessment  Patient Details  Name: Sandy Daniel MRN: [id number] Date of Birth: May 21, 1928  Date of referral:  10/23/17               Reason for consult:  Facility Placement                Permission sought to share information with:  Family Supports, Magazine features editor Permission granted to share information::  Yes, Verbal Permission Granted  Name::     Sharrell Ku [phone]   Agency::  [facility].  Relationship::     Contact Information:     Housing/Transportation Living arrangements for the past 2 months:  Group Home Source of Information:  Medical Team, Friend/Neighbor Patient Interpreter Needed:  None Criminal Activity/Legal Involvement Pertinent to Current Situation/Hospitalization:  No - Comment as needed Significant Relationships:  Other(Comment)(Family Care Home manager.) Lives with:  Facility Resident Do you feel safe going back to the place where you live?  Yes Need for family participation in patient care:  No (Coment)  Care giving concerns:  Patient does not express any concerns about returning to family care home, neither does family care home manager.  Social Worker assessment / plan:  Patient is an 84 year old female who is from [facility].  Patient is alert and oriented x4, patient has been at family care home for a few months, patient plans to return back to family care home.  Patient is hard of hearing, CSW spoke to Flint Melter to complete assessment.  Jasmine December reports that patient has been at facility for a few months, she does not normally have oxygen, and she is usually independent with transfers and some ADLs.  Patient is a chronic smoker, and chooses not to stop smoking cigarettes.  Patient's caregiver Jasmine December states that patient can return when she is medically ready for discharge and orders have been received.  Employment status:  Retired Health and safety inspector:  Armed forces operational officer, Medicaid In  [place] PT Recommendations:  Not assessed at this time Information / Referral to community resources:     Patient/Family's Response to care:  Patient is in agreement to returning back to family care home.  Patient/Family's Understanding of and Emotional Response to Diagnosis, Current Treatment, and Prognosis:  Patient did not have any concerns about returning back to family care home.  Emotional Assessment Appearance:  Appears stated age Attitude/Demeanor/Rapport:    Affect (typically observed):  Appropriate, Pleasant Orientation:  Oriented to Self, Oriented to Place, Oriented to  Time, Oriented to Situation Alcohol / Substance use:  Not Applicable Psych involvement (Current and /or in the community):  No (Comment)  Discharge Needs  Concerns to be addressed:  Care Coordination Readmission within the last 30 days:  No Current discharge risk:  None Barriers to Discharge:  Continued Medical Work up   [place] Constable 10/23/2017, 5:31 PM

## 2017-10-24 MED ORDER — METHYLPREDNISOLONE SODIUM SUCC 40 MG IJ SOLR
40.0000 mg | INTRAMUSCULAR | Status: DC
  Administered 2017-10-25 – 2017-10-26 (×2): 40 mg via INTRAVENOUS
  Filled 2017-10-24 (×2): qty 1

## 2017-10-24 MED ORDER — POLYETHYLENE GLYCOL 3350 17 G PO PACK
17.0000 g | PACK | Freq: Every day | ORAL | Status: DC
  Administered 2017-10-24 – 2017-10-26 (×3): 17 g via ORAL
  Filled 2017-10-24 (×2): qty 1

## 2017-10-24 MED ORDER — BISACODYL 10 MG RE SUPP: 10.0000 mg | Freq: Every day | RECTAL | Status: DC | PRN

## 2017-10-24 MED ORDER — GUAIFENESIN-DM 100-10 MG/5ML PO SYRP
5.0000 mL | ORAL_SOLUTION | Freq: Four times a day (QID) | ORAL | Status: DC
  Administered 2017-10-24 – 2017-10-27 (×12): 5 mL via ORAL
  Filled 2017-10-24 (×12): qty 5

## 2017-10-24 MED ORDER — METHYLPREDNISOLONE SODIUM SUCC 40 MG IJ SOLR: 40.0000 mg | Freq: Two times a day (BID) | INTRAMUSCULAR | Status: DC

## 2017-10-24 MED ORDER — DOCUSATE SODIUM 100 MG PO CAPS
100.0000 mg | ORAL_CAPSULE | Freq: Two times a day (BID) | ORAL | Status: DC
  Administered 2017-10-24 – 2017-10-26 (×4): 100 mg via ORAL
  Filled 2017-10-24 (×5): qty 1

## 2017-10-24 MED ORDER — HYDROCOD POLST-CPM POLST ER 10-8 MG/5ML PO SUER
5.0000 mL | Freq: Two times a day (BID) | ORAL | Status: DC | PRN
  Administered 2017-10-24 – 2017-10-27 (×4): 5 mL via ORAL
  Filled 2017-10-24 (×4): qty 5

## 2017-10-24 NOTE — Care Management (Signed)
Qualified for home 02. Readings will exire 9/14.

## 2017-10-24 NOTE — Progress Notes (Signed)
SATURATION QUALIFICATIONS: (This note is used to comply with regulatory documentation for home oxygen)  Patient Saturations on Room Air at Rest = 86%  Patient Saturations on Room Air while Ambulating = 82%  Patient Saturations on 3 Liters of oxygen while Ambulating = 90%  Please briefly explain why patient needs home oxygen:  Refer to values above

## 2017-10-24 NOTE — Progress Notes (Signed)
Talked to Dr. Amado Coe about patient's reported her last BM "last week", charted 10/16/2017, order for Miralax, colace and suppository. RN will continue to monitor.

## 2017-10-24 NOTE — Progress Notes (Signed)
[facility] Encounter Note  Patient: Sandy Daniel / Admit Date: 10/21/2017 / Date of Encounter: 10/24/2017, 8:38 AM   Subjective: No significant symptoms overnight.  Patient is breathing much easier with mild cough and congestion.  No evidence of hemodynamic compromise.  Telemetry shows normal sinus rhythm.  Review of Systems: Positive for: Off and congestion Negative for: Vision change, hearing change, syncope, dizziness, nausea, vomiting,diarrhea, bloody stool, stomach pain, positive for cough, congestion, negative for diaphoresis, urinary frequency, urinary pain,skin lesions, skin rashes Others previously listed  Objective: Telemetry: Normal sinus rhythm Physical Exam: Blood pressure (!) 129/53, pulse (!) 55, temperature 98.6 F (37 C), temperature source Oral, resp. rate 18, height 5\' 5"  (1.651 m), weight 65.9 kg, SpO2 93 %. Body mass index is 24.18 kg/m. General: Well developed, well nourished, in no acute distress. Head: Normocephalic, atraumatic, sclera non-icteric, no xanthomas, nares are without discharge. Neck: No apparent masses Lungs: Normal respirations with few wheezes, some rhonchi, no rales , no crackles   Heart: Regular rate and rhythm, normal S1 S2, no murmur, no rub, no gallop, PMI is normal size and placement, carotid upstroke normal without bruit, jugular venous pressure normal Abdomen: Soft, non-tender, non-distended with normoactive bowel sounds. No hepatosplenomegaly. Abdominal aorta is normal size without bruit Extremities: No edema, no clubbing, no cyanosis, no ulcers,  Peripheral: 2+ radial, 2+ femoral, 2+ dorsal pedal pulses Neuro: Alert and oriented. Moves all extremities spontaneously. Psych:  Responds to questions appropriately with a normal affect.   Intake/Output Summary (Last 24 hours) at 10/24/2017 0838 Last data filed at 10/23/2017 1856 Gross per 24 hour  Intake 600 ml  Output -  Net 600 ml    Inpatient Medications:  .  aspirin EC  81 mg Oral Daily  . atorvastatin  10 mg Oral Daily  . azithromycin  500 mg Oral Daily  . busPIRone  10 mg Oral BID  . enoxaparin (LOVENOX) injection  40 mg Subcutaneous Q24H  . furosemide  10 mg Oral Daily  . gabapentin  800 mg Oral TID  . ipratropium-albuterol  3 mL Nebulization Q6H  . methylPREDNISolone (SOLU-MEDROL) injection  40 mg Intravenous Q8H  . mometasone-formoterol  2 puff Inhalation BID  . pantoprazole  40 mg Oral Daily  . phenytoin  200 mg Oral QHS  . QUEtiapine  50 mg Oral QHS  . senna-docusate  1 tablet Oral QHS  . sertraline  100 mg Oral Daily  . sodium chloride flush  3 mL Intravenous Q12H   Infusions:   Labs: Recent Labs    10/21/17 2107 10/22/17 0125  NA 142 141  K 3.6 3.5  CL 104 105  CO2 29 30  GLUCOSE 124* 140*  BUN 15 14  CREATININE 0.79 0.76  CALCIUM 8.4* 8.5*   No results for input(s): AST, ALT, ALKPHOS, BILITOT, PROT, ALBUMIN in the last 72 hours. Recent Labs    10/21/17 2107 10/22/17 0125  WBC 4.5 3.5*  NEUTROABS 1.9  --   HGB 13.9 13.3  HCT 40.9 38.9  MCV 90.9 89.7  PLT 220 211   No results for input(s): CKTOTAL, CKMB, TROPONINI in the last 72 hours. Invalid input(s): POCBNP No results for input(s): HGBA1C in the last 72 hours.   Weights: Filed Weights   10/22/17 0106 10/23/17 0405 10/24/17 0601  Weight: 63.6 kg 64.3 kg 65.9 kg     Radiology/Studies:  Dg Chest 1 View  Result Date: 10/21/2017 CLINICAL DATA:  Short of breath EXAM: CHEST  1 VIEW  COMPARISON:  09/07/2017 FINDINGS: Cardiomegaly without overt edema. Linear scarring or atelectasis within the left mid and lower lung. Aortic atherosclerosis. No pneumothorax. IMPRESSION: No active disease. Cardiomegaly with stable scarring in the left mid and lower lung. Electronically Signed   By: Jasmine Pang M.D.   On: 10/21/2017 21:38     Assessment and Recommendation  84 y.o. female with known peripheral vascular disease coronary atherosclerosis with some mild bronchitis  and/or cough and congestion with an incidental finding of pericardial effusion hemodynamically stable at this time without evidence of significance 1.  Continue supportive care for bronchitis infection and cough and congestion 2.  No further cardiac intervention at this time with stable incidental pericardial effusion with possible cause as inflammation and infection 3.  Again ambulation and follow for improvements of symptoms 4.  Okay for discharge home from a cardiac standpoint with follow-up in next few weeks  Signed, Arnoldo Hooker M.D. FACC

## 2017-10-24 NOTE — Care Management Important Message (Signed)
Copy of signed IM left with patient in room.  

## 2017-10-24 NOTE — Progress Notes (Signed)
[facility] - [place] [facility]   PATIENT NAME: Sandy Daniel    MR#:  [id number]  DATE OF BIRTH:  [birth year]  SUBJECTIVE:  CHIEF COMPLAINT: Patient is coughing intermittently and shortness of breath is better  REVIEW OF SYSTEMS:  CONSTITUTIONAL: No fever, fatigue or weakness.  EYES: No blurred or double vision.  EARS, NOSE, AND THROAT: No tinnitus or ear pain.  RESPIRATORY: Reports intermittent episodes of cough, exertional shortness of breath, denies wheezing or hemoptysis.  CARDIOVASCULAR: No chest pain, orthopnea, edema.  GASTROINTESTINAL: No nausea, vomiting, diarrhea or abdominal pain.  GENITOURINARY: No dysuria, hematuria.  ENDOCRINE: No polyuria, nocturia,  HEMATOLOGY: No anemia, easy bruising or bleeding SKIN: No rash or lesion. MUSCULOSKELETAL: No joint pain or arthritis.   NEUROLOGIC: No tingling, numbness, weakness.  PSYCHIATRY: No anxiety or depression.   DRUG ALLERGIES:   Allergies  Allergen Reactions  . Motrin [Ibuprofen] Shortness Of Breath and Swelling    Swelling of lip/tongue  . Tolmetin Rash  . Alendronate     Other reaction(s): Nausea And Vomiting Other reaction(s): Nausea And Vomiting  . Other Rash    Any Steriods. Causes mouth sores  . Tramadol Hcl Nausea And Vomiting    VITALS:  Blood pressure (!) 144/68, pulse (!) 53, temperature 98.6 F (37 C), temperature source Oral, resp. rate 16, height 5\' 5"  (1.651 m), weight 65.9 kg, SpO2 92 %.  PHYSICAL EXAMINATION:  GENERAL:  84 y.o.-year-old patient lying in the bed with no acute distress.  EYES: Pupils equal, round, reactive to light and accommodation. No scleral icterus. Extraocular muscles intact.  HEENT: Head atraumatic, normocephalic. Oropharynx and nasopharynx clear.  NECK:  Supple, no jugular venous distention. No thyroid enlargement, no tenderness.  LUNGS: Diminished breath sounds bilaterally, no wheezing, positive rales,rhonchi ,no crepitation. No use of  accessory muscles of respiration.  CARDIOVASCULAR: S1, S2 normal. No murmurs, rubs, or gallops.  ABDOMEN: Soft, nontender, nondistended. Bowel sounds present. No organomegaly or mass.  EXTREMITIES: No pedal edema, cyanosis, or clubbing.  NEUROLOGIC: Cranial nerves II through XII are intact.Sensation intact. Gait not checked.  PSYCHIATRIC: The patient is alert and oriented x 3.  SKIN: No obvious rash, lesion, or ulcer.    LABORATORY PANEL:   CBC Recent Labs  Lab 10/22/17 0125  WBC 3.5*  HGB 13.3  HCT 38.9  PLT 211   ------------------------------------------------------------------------------------------------------------------  Chemistries  Recent Labs  Lab 10/22/17 0125  NA 141  K 3.5  CL 105  CO2 30  GLUCOSE 140*  BUN 14  CREATININE 0.76  CALCIUM 8.5*   ------------------------------------------------------------------------------------------------------------------  Cardiac Enzymes No results for input(s): TROPONINI in the last 168 hours. ------------------------------------------------------------------------------------------------------------------  RADIOLOGY:  No results found.  EKG:   Orders placed or performed during the hospital encounter of 10/21/17  . EKG 12-Lead  . EKG 12-Lead  . ED EKG  . ED EKG    ASSESSMENT AND PLAN:   Acute respiratory failure secondary to COPD exacerbation Clinically getting better at a slower pace Patient is on scheduled cough medicine for coughing spells and Tussionex as needed Currently on 3 L of oxygen wean off as tolerated Continue IV Solu-Medrol and wean off Duo nebs every 6 hours and albuterol as needed Azithromycin Supportive measures and as needed cough meds   History of congestive heart failure with preserved ejection fraction no significant pulmonary edema 1 dose IV Lasix was given BNP is somewhat elevated  echocardiogram-cavity site was normal.  Moderate pericardial effusion was identified, will consult  cardiology  Coronary artery disease-continue home medication aspirin, Lipitor  Seizure disorder-continue home medication Dilantin  GERD PPI  Hyperlipidemia continue statin   All the records are reviewed and case discussed with Care Management/Social Workerr. Management plans discussed with the patient, family and they are in agreement.  CODE STATUS: DNR  TOTAL TIME TAKING CARE OF THIS PATIENT: 35  minutes.   POSSIBLE D/C IN 2 DAYS, DEPENDING ON CLINICAL CONDITION.  Note: This dictation was prepared with Dragon dictation along with smaller phrase technology. Any transcriptional errors that result from this process are unintentional.   Ramonita Lab M.D on 10/24/2017 at 4:40 PM  Between 7am to 6pm - Pager - [phone] After 6pm go to [email or web address] - password [email or web address] [facility]  [facility]  Office  [phone]  CC: Primary care physician; Galen Manila, NP

## 2017-10-25 ENCOUNTER — Inpatient Hospital Stay: Payer: Medicare HMO

## 2017-10-25 MED ORDER — MAGNESIUM HYDROXIDE 400 MG/5ML PO SUSP
30.0000 mL | Freq: Every day | ORAL | Status: DC | PRN
  Administered 2017-10-25: 30 mL via ORAL

## 2017-10-25 MED ORDER — SODIUM CHLORIDE 0.9 % IV SOLN
INTRAVENOUS | Status: DC | PRN
  Administered 2017-10-25 – 2017-10-26 (×2): 500 mL via INTRAVENOUS

## 2017-10-25 MED ORDER — SODIUM CHLORIDE 0.9 % IV SOLN
1.0000 g | INTRAVENOUS | Status: DC
  Administered 2017-10-25: 1 g via INTRAVENOUS
  Filled 2017-10-25: qty 10
  Filled 2017-10-25: qty 1

## 2017-10-25 MED ORDER — SODIUM CHLORIDE 0.9 % IV SOLN
2.0000 g | Freq: Two times a day (BID) | INTRAVENOUS | Status: DC
  Administered 2017-10-25 – 2017-10-27 (×4): 2 g via INTRAVENOUS
  Filled 2017-10-25 (×6): qty 2

## 2017-10-25 MED ORDER — MAGNESIUM HYDROXIDE 400 MG/5ML PO SUSP
5.0000 mL | Freq: Every day | ORAL | Status: DC | PRN
  Filled 2017-10-25: qty 30

## 2017-10-25 NOTE — Progress Notes (Signed)
Pt IV 20G Left arm appears to swell around site when flushing with NS. Attempted to insert another IV 20G higher up on the left arm but was unsuccessful.

## 2017-10-25 NOTE — Care Management (Signed)
Continues with IV Solu-Medrol and every 6 hr. nebs.  And IV Rocephin added for continued cough.  Barbara Cower with [facility] made aware of possible D/C over the weekend and need for home O2.  Notified RN that the patients O2  reading will expire on 9-14.

## 2017-10-25 NOTE — Progress Notes (Signed)
Pharmacy Antibiotic Note  Sandy Daniel is a 84 y.o. female admitted on 10/21/2017 with pneumonia.  Pharmacy has been consulted for Cefepime dosing.  Plan: Cefepime 2 gm IV every 12 hours  Height: 5\' 5"  (165.1 cm) Weight: 142 lb 14.4 oz (64.8 kg) IBW/kg (Calculated) : 57  Temp (24hrs), Avg:98.3 F (36.8 C), Min:98.2 F (36.8 C), Max:98.3 F (36.8 C)  Recent Labs  Lab 10/21/17 2107 10/22/17 0124 10/22/17 0125  WBC 4.5  --  3.5*  CREATININE 0.79  --  0.76  LATICACIDVEN 1.1 1.0  --     Estimated Creatinine Clearance: 44.6 mL/min (by C-G formula based on SCr of 0.76 mg/dL).    Allergies  Allergen Reactions  . Motrin [Ibuprofen] Shortness Of Breath and Swelling    Swelling of lip/tongue  . Tolmetin Rash  . Alendronate     Other reaction(s): Nausea And Vomiting Other reaction(s): Nausea And Vomiting  . Other Rash    Any Steriods. Causes mouth sores  . Tramadol Hcl Nausea And Vomiting    Antimicrobials this admission: Cefepime 09/13 >>  Azithromycin 09/10 >> Ceftriaxone 09/13 >> 09/13  Microbiology results:  BCx:   UCx:    Sputum:   09/13 MRSA PCR: negative  Thank you for allowing pharmacy to be a part of this patient's care.  Orinda Kenner 10/25/2017 6:41 PM

## 2017-10-25 NOTE — Progress Notes (Signed)
[facility] - [place] at [facility]   PATIENT NAME: Sandy Daniel    MR#:  [id number]  DATE OF BIRTH:  1929/02/10  SUBJECTIVE:  CHIEF COMPLAINT: Patient's cough is not getting better and does not feel great Repeat chest x-ray with superimposed infiltrate  REVIEW OF SYSTEMS:  CONSTITUTIONAL: No fever, fatigue or weakness.  EYES: No blurred or double vision.  EARS, NOSE, AND THROAT: No tinnitus or ear pain.  RESPIRATORY: Reports intermittent episodes of cough, exertional shortness of breath, denies wheezing or hemoptysis.  CARDIOVASCULAR: No chest pain, orthopnea, edema.  GASTROINTESTINAL: No nausea, vomiting, diarrhea or abdominal pain.  GENITOURINARY: No dysuria, hematuria.  ENDOCRINE: No polyuria, nocturia,  HEMATOLOGY: No anemia, easy bruising or bleeding SKIN: No rash or lesion. MUSCULOSKELETAL: No joint pain or arthritis.   NEUROLOGIC: No tingling, numbness, weakness.  PSYCHIATRY: No anxiety or depression.   DRUG ALLERGIES:   Allergies  Allergen Reactions  . Motrin [Ibuprofen] Shortness Of Breath and Swelling    Swelling of lip/tongue  . Tolmetin Rash  . Alendronate     Other reaction(s): Nausea And Vomiting Other reaction(s): Nausea And Vomiting  . Other Rash    Any Steriods. Causes mouth sores  . Tramadol Hcl Nausea And Vomiting    VITALS:  Blood pressure (!) 117/48, pulse 62, temperature 98.3 F (36.8 C), temperature source Oral, resp. rate 20, height 5\' 5"  (1.651 m), weight 64.8 kg, SpO2 91 %.  PHYSICAL EXAMINATION:  GENERAL:  84 y.o.-year-old patient lying in the bed with no acute distress.  EYES: Pupils equal, round, reactive to light and accommodation. No scleral icterus. Extraocular muscles intact.  HEENT: Head atraumatic, normocephalic. Oropharynx and nasopharynx clear.  NECK:  Supple, no jugular venous distention. No thyroid enlargement, no tenderness.  LUNGS: Diminished breath sounds bilaterally, no wheezing, positive  rales,rhonchi ,no crepitation. No use of accessory muscles of respiration.  CARDIOVASCULAR: S1, S2 normal. No murmurs, rubs, or gallops.  ABDOMEN: Soft, nontender, nondistended. Bowel sounds present. No organomegaly or mass.  EXTREMITIES: No pedal edema, cyanosis, or clubbing.  NEUROLOGIC: Cranial nerves II through XII are intact.Sensation intact. Gait not checked.  PSYCHIATRIC: The patient is alert and oriented x 3.  SKIN: No obvious rash, lesion, or ulcer.    LABORATORY PANEL:   CBC Recent Labs  Lab 10/22/17 0125  WBC 3.5*  HGB 13.3  HCT 38.9  PLT 211   ------------------------------------------------------------------------------------------------------------------  Chemistries  Recent Labs  Lab 10/22/17 0125  NA 141  K 3.5  CL 105  CO2 30  GLUCOSE 140*  BUN 14  CREATININE 0.76  CALCIUM 8.5*   ------------------------------------------------------------------------------------------------------------------  Cardiac Enzymes No results for input(s): TROPONINI in the last 168 hours. ------------------------------------------------------------------------------------------------------------------  RADIOLOGY:  Dg Chest 2 View  Result Date: 10/25/2017 CLINICAL DATA:  Shortness of breath beginning today. History of asthma. EXAM: CHEST - 2 VIEW COMPARISON:  10/21/2017.  09/07/2017. FINDINGS: Chronic cardiomegaly and aortic atherosclerosis. The right lung remains clear. Density at the left base is largely chronic and likely related to scarring. There could be an element of active infiltrate and volume loss superimposed. The upper lungs remain clear. No visible effusion. No acute bone finding. IMPRESSION: Chronic cardiomegaly and aortic atherosclerosis. Chronic scarring at the left lung base. There could possibly be some superimposed acute infiltrate and volume loss in that region. Electronically Signed   By: Paulina Fusi M.D.   On: 10/25/2017 16:53    EKG:   Orders placed  or performed during the hospital encounter of  10/21/17  . EKG 12-Lead  . EKG 12-Lead  . ED EKG  . ED EKG    ASSESSMENT AND PLAN:   Acute respiratory failure secondary to COPD exacerbation superimposed healthcare associated pneumonia Clinically getting better at a slower pace Cefepime is added to the regimen for healthcare associated pneumonia MRSA PCR is negative not considering vancomycin Completed 5-day course of azithromycin during the hospital course with no clinical improvement Patient is on scheduled cough medicine for coughing spells and Tussionex as needed Currently on 3 L of oxygen wean off as tolerated Continue IV Solu-Medrol and wean off Duo nebs every 6 hours and albuterol as needed Supportive measures and as needed cough meds  History of congestive heart failure with preserved ejection fraction no significant pulmonary edema 1 dose IV Lasix was given BNP is somewhat elevated  echocardiogram- Moderate pericardial effusion was identified, will consult cardiology  Moderate pericardial effusion on echocardiogram Incidental finding no cardiac interventions needed seen by Kovalski okay to  discharge from cardiology standpoint  Coronary artery disease-continue home medication aspirin, Lipitor  Seizure disorder-continue home medication Dilantin  GERD PPI  Hyperlipidemia continue statin   All the records are reviewed and case discussed with Care Management/Social Workerr. Management plans discussed with the patient, family and they are in agreement.  CODE STATUS: DNR  TOTAL TIME TAKING CARE OF THIS PATIENT: 35  minutes.   POSSIBLE D/C IN 2 DAYS, DEPENDING ON CLINICAL CONDITION.  Note: This dictation was prepared with Dragon dictation along with smaller phrase technology. Any transcriptional errors that result from this process are unintentional.   Ramonita Lab M.D on 10/25/2017 at 6:21 PM  Between 7am to 6pm - Pager - [phone] After 6pm go to [email or web address] -  password [email or web address] [facility]  [facility]  Office  [phone]  CC: Primary care physician; Galen Manila, NP

## 2017-10-26 LAB — BASIC METABOLIC PANEL
Anion gap: 8 (ref 5–15)
BUN: 41 mg/dL — AB (ref 8–23)
CHLORIDE: 101 mmol/L (ref 98–111)
CO2: 32 mmol/L (ref 22–32)
Calcium: 8 mg/dL — ABNORMAL LOW (ref 8.9–10.3)
Creatinine, Ser: 0.78 mg/dL (ref 0.44–1.00)
GFR calc Af Amer: >60 mL/min (ref >60)
GFR calc non Af Amer: >60 mL/min (ref >60)
Glucose, Bld: 106 mg/dL — ABNORMAL HIGH (ref 70–99)
POTASSIUM: 4.9 mmol/L (ref 3.5–5.1)
SODIUM: 141 mmol/L (ref 135–145)

## 2017-10-26 LAB — CBC
HCT: 37.3 % (ref 35.0–47.0)
HEMOGLOBIN: 12.3 g/dL (ref 12.0–16.0)
MCH: 29.4 pg (ref 26.0–34.0)
MCHC: 33.1 g/dL (ref 32.0–36.0)
MCV: 89 fL (ref 80.0–100.0)
Platelets: 212 10*3/uL (ref 150–440)
RBC: 4.2 MIL/uL (ref 3.80–5.20)
RDW: 16.2 % — ABNORMAL HIGH (ref 11.5–14.5)
WBC: 4.9 10*3/uL (ref 3.6–11.0)

## 2017-10-26 MED ORDER — PREDNISONE 50 MG PO TABS
50.0000 mg | ORAL_TABLET | Freq: Every day | ORAL | Status: DC
  Administered 2017-10-27: 50 mg via ORAL
  Filled 2017-10-26: qty 1

## 2017-10-26 NOTE — Progress Notes (Signed)
[facility] - [place] at [facility]   PATIENT NAME: Sandy Daniel    MR#:  [id number]  DATE OF BIRTH:  09/05/1928  SUBJECTIVE:  patient out in the chair. She has her oxygen tubing off. She states her coughing spell has lightened up. She is feeling weak.  REVIEW OF SYSTEMS:  CONSTITUTIONAL: No fever, fatigue or weakness.  EYES: No blurred or double vision.  EARS, NOSE, AND THROAT: No tinnitus or ear pain.  RESPIRATORY: Reports intermittent episodes of cough, exertional shortness of breath, denies wheezing or hemoptysis.  CARDIOVASCULAR: No chest pain, orthopnea, edema.  GASTROINTESTINAL: No nausea, vomiting, diarrhea or abdominal pain.  GENITOURINARY: No dysuria, hematuria.  ENDOCRINE: No polyuria, nocturia,  HEMATOLOGY: No anemia, easy bruising or bleeding SKIN: No rash or lesion. MUSCULOSKELETAL: No joint pain or arthritis.   NEUROLOGIC: No tingling, numbness, weakness.  PSYCHIATRY: No anxiety or depression.   DRUG ALLERGIES:   Allergies  Allergen Reactions  . Motrin [Ibuprofen] Shortness Of Breath and Swelling    Swelling of lip/tongue  . Tolmetin Rash  . Alendronate     Other reaction(s): Nausea And Vomiting Other reaction(s): Nausea And Vomiting  . Other Rash    Any Steriods. Causes mouth sores  . Tramadol Hcl Nausea And Vomiting    VITALS:  Blood pressure (!) 120/45, pulse (!) 47, temperature 98.2 F (36.8 C), temperature source Oral, resp. rate 18, height 5\' 5"  (1.651 m), weight 64.9 kg, SpO2 93 %.  PHYSICAL EXAMINATION:  GENERAL:  84 y.o.-year-old patient lying in the bed with no acute distress.  EYES: Pupils equal, round, reactive to light and accommodation. No scleral icterus. Extraocular muscles intact.  HEENT: Head atraumatic, normocephalic. Oropharynx and nasopharynx clear.  NECK:  Supple, no jugular venous distention. No thyroid enlargement, no tenderness.  LUNGS: Diminished breath sounds bilaterally, no wheezing, no rhonchi  ,no crepitation. No use of accessory muscles of respiration.  CARDIOVASCULAR: S1, S2 normal. No murmurs, rubs, or gallops.  ABDOMEN: Soft, nontender, nondistended. Bowel sounds present. No organomegaly or mass.  EXTREMITIES: No pedal edema, cyanosis, or clubbing.  NEUROLOGIC: Cranial nerves II through XII are intact.Sensation intact. Gait not checked.  PSYCHIATRIC: The patient is alert and oriented x 3.  SKIN: No obvious rash, lesion, or ulcer.    LABORATORY PANEL:   CBC Recent Labs  Lab 10/25/17 2357  WBC 4.9  HGB 12.3  HCT 37.3  PLT 212   ------------------------------------------------------------------------------------------------------------------  Chemistries  Recent Labs  Lab 10/25/17 2357  NA 141  K 4.9  CL 101  CO2 32  GLUCOSE 106*  BUN 41*  CREATININE 0.78  CALCIUM 8.0*   ------------------------------------------------------------------------------------------------------------------  Cardiac Enzymes No results for input(s): TROPONINI in the last 168 hours. ------------------------------------------------------------------------------------------------------------------  RADIOLOGY:  Dg Chest 2 View  Result Date: 10/25/2017 CLINICAL DATA:  Shortness of breath beginning today. History of asthma. EXAM: CHEST - 2 VIEW COMPARISON:  10/21/2017.  09/07/2017. FINDINGS: Chronic cardiomegaly and aortic atherosclerosis. The right lung remains clear. Density at the left base is largely chronic and likely related to scarring. There could be an element of active infiltrate and volume loss superimposed. The upper lungs remain clear. No visible effusion. No acute bone finding. IMPRESSION: Chronic cardiomegaly and aortic atherosclerosis. Chronic scarring at the left lung base. There could possibly be some superimposed acute infiltrate and volume loss in that region. Electronically Signed   By: Paulina Fusi M.D.   On: 10/25/2017 16:53    EKG:   Orders placed or performed  during the hospital encounter of 10/21/17  . EKG 12-Lead  . EKG 12-Lead  . ED EKG  . ED EKG    ASSESSMENT AND PLAN:   *Acute respiratory failure secondary to COPD exacerbation superimposed healthcare associated pneumonia Clinically getting better at a slower pace Cefepime is added to the regimen for healthcare associated pneumonia MRSA PCR is negative Completed 5-day course of azithromycin during the hospital course with no clinical improvement Patient is on scheduled cough medicine for coughing spells and Tussionex as needed Currently on 3 L of oxygen wean off as tolerated--assess for home oxygen Continue IV Solu-Medrol --change to po prednisone Duo nebs every 6 hours and albuterol as needed Supportive measures and as needed cough meds  *History of congestive heart failure with preserved ejection fraction no significant pulmonary edema 1 dose IV Lasix was given BNP is somewhat elevated  echocardiogram- Moderate pericardial effusion was identified, will consult cardiology  *Moderate pericardial effusion on echocardiogram Incidental finding no cardiac interventions needed seen by Dr Gwen Pounds okay to  discharge from cardiology standpoint  *Coronary artery disease-continue home medication aspirin, Lipitor  *Seizure disorder-continue home medication Dilantin  *GERD PPI  *Hyperlipidemia continue statin  Will d/c to family care home tomorrow. Patient is agreeable. As for home oxygen use  All the records are reviewed and case discussed with Care Management/Social Workerr. Management plans discussed with the patient, family and they are in agreement.  CODE STATUS: DNR  TOTAL TIME TAKING CARE OF THIS PATIENT: 35  minutes.   POSSIBLE D/C IN 2 DAYS, DEPENDING ON CLINICAL CONDITION.  Note: This dictation was prepared with Dragon dictation along with smaller phrase technology. Any transcriptional errors that result from this process are unintentional.   Enedina Finner M.D on  10/26/2017 at 3:26 PM  Between 7am to 6pm - Pager - [phone] After 6pm go to [email or web address] - password [email or web address] [facility]  [place] [facility]  Office  [phone]  CC: Primary care physician; Galen Manila, NP

## 2017-10-27 MED ORDER — CEFDINIR 300 MG PO CAPS: 300.0000 mg | ORAL_CAPSULE | Freq: Two times a day (BID) | ORAL | 0 refills | Status: DC

## 2017-10-27 MED ORDER — CEFDINIR 300 MG PO CAPS: 300.0000 mg | ORAL_CAPSULE | Freq: Two times a day (BID) | ORAL | Status: DC

## 2017-10-27 MED ORDER — IPRATROPIUM-ALBUTEROL 0.5-2.5 (3) MG/3ML IN SOLN
3.0000 mL | Freq: Two times a day (BID) | RESPIRATORY_TRACT | Status: DC
  Administered 2017-10-27: 3 mL via RESPIRATORY_TRACT
  Filled 2017-10-27: qty 3

## 2017-10-27 MED ORDER — PREDNISONE 10 MG PO TABS: ORAL_TABLET | ORAL | 0 refills | Status: DC

## 2017-10-27 NOTE — Plan of Care (Signed)
Less coughing noted this shift.  Less dyspnea on exertion noted. Saturations qualifications completed.

## 2017-10-27 NOTE — Discharge Summary (Signed)
[facility] - [place] at [facility]   PATIENT NAME: Sandy Daniel    MR#:  [id number]  DATE OF BIRTH:  September 17, 1928  DATE OF ADMISSION:  10/21/2017 ADMITTING PHYSICIAN: Oralia Manis, MD  DATE OF DISCHARGE: 10/27/2017  PRIMARY CARE PHYSICIAN: Galen Manila, NP    ADMISSION DIAGNOSIS:  Cough [R05] COPD exacerbation (HCC) [J44.1] Acute respiratory failure with hypoxia (HCC) [J96.01] Acute on chronic congestive heart failure, unspecified heart failure type (HCC) [I50.9]  DISCHARGE DIAGNOSIS:   Acute hypoxic respiratory failure due to COPD and CHF diastolic  Pneumonia SECONDARY DIAGNOSIS:   Past Medical History:  Diagnosis Date  . Arthritis   . Asthma   . B12 deficiency   . CAD (coronary artery disease)   . COPD (chronic obstructive pulmonary disease) (HCC)   . Depression   . Epilepsy (HCC)   . GERD (gastroesophageal reflux disease)   . Hyperlipidemia   . Hypertension   . Hypoxemia   . Macular degeneration (senile) of retina   . Macular degeneration of both eyes   . MI (myocardial infarction) (HCC) 09/2003, 05/2013  . Physiological tremor   . Presence of stent of CABG    x2   . Seizures (HCC)     HOSPITAL COURSE:   *Acute respiratory failure secondary to COPD exacerbation superimposed healthcare associated pneumonia Clinically getting better at a slower pace Cefepime is added to the regimen for healthcare associated pneumonia--change to cefdinir to 3 more days MRSA PCR is negative Completed 5-day course of azithromycin during the hospital course with no clinical improvement Patient is on scheduled cough medicine for coughing spells and Tussionex as needed Currently on 3 L of oxygen wean off as tolerated--assess for home oxygen--pt qualifies for home oxygen Continue IV Solu-Medrol --change to po prednisone Duo nebs every 6 hours and albuterol as needed Supportive measures and as needed cough meds  *History of congestive heart  failure with preserved ejection fraction no significant pulmonary edema 1 dose IV Lasix was given--cont oral lasix BNP is somewhat elevated  echocardiogram- Moderate pericardial effusion was identified  *Moderate pericardial effusion on echocardiogram Incidental finding no cardiac interventions needed seen by Dr Gwen Pounds okay to  discharge from cardiology standpoint  *Coronary artery disease-continue home medication aspirin, Lipitor  *Seizure disorder-continue home medication Dilantin  *GERD PPI  *Hyperlipidemia continue statin  Overall improving. Will d/c to family care home with out pt cardiology and pulmonary f/u HH and home oxygen will be set up.spoke with Flint Melter  CONSULTS OBTAINED:  Treatment Team:  Lamar Blinks, MD  DRUG ALLERGIES:   Allergies  Allergen Reactions  . Motrin [Ibuprofen] Shortness Of Breath and Swelling    Swelling of lip/tongue  . Tolmetin Rash  . Alendronate     Other reaction(s): Nausea And Vomiting Other reaction(s): Nausea And Vomiting  . Other Rash    Any Steriods. Causes mouth sores  . Tramadol Hcl Nausea And Vomiting    DISCHARGE MEDICATIONS:   Allergies as of 10/27/2017      Reactions   Motrin [ibuprofen] Shortness Of Breath, Swelling   Swelling of lip/tongue   Tolmetin Rash   Alendronate    Other reaction(s): Nausea And Vomiting Other reaction(s): Nausea And Vomiting   Other Rash   Any Steriods. Causes mouth sores   Tramadol Hcl Nausea And Vomiting      Medication List    STOP taking these medications   Lidocaine 0.5 % Gel   magnesium hydroxide 400 MG/5ML suspension Commonly known  as:  MILK OF MAGNESIA     TAKE these medications   albuterol 108 (90 Base) MCG/ACT inhaler Commonly known as:  PROVENTIL HFA;VENTOLIN HFA Inhale 2 puffs into the lungs every 6 (six) hours as needed for wheezing or shortness of breath.   albuterol (2.5 MG/3ML) 0.083% nebulizer solution Commonly known as:  PROVENTIL Take 3 mLs (2.5  mg total) by nebulization every 6 (six) hours as needed for Wheezing.   aspirin 81 MG tablet Take 1 tablet (81 mg total) by mouth daily.   atorvastatin 10 MG tablet Commonly known as:  LIPITOR Take 1 tablet (10 mg total) by mouth daily.   busPIRone 10 MG tablet Commonly known as:  BUSPAR Take 1 tablet (10 mg total) by mouth 2 (two) times daily.   cefdinir 300 MG capsule Commonly known as:  OMNICEF Take 1 capsule (300 mg total) by mouth every 12 (twelve) hours. Start taking on:  10/28/2017   Fluticasone-Salmeterol 100-50 MCG/DOSE Aepb Commonly known as:  ADVAIR Inhale 1 puff into the lungs 2 (two) times daily.   furosemide 20 MG tablet Commonly known as:  LASIX Take 0.5 tablets (10 mg total) by mouth daily.   gabapentin 400 MG capsule Commonly known as:  NEURONTIN Take 2 capsules (800 mg total) by mouth 3 (three) times daily.   guaiFENesin-dextromethorphan 100-10 MG/5ML syrup Commonly known as:  ROBITUSSIN DM Take 15 mLs by mouth every 6 (six) hours as needed for cough.   HYDROcodone-acetaminophen 7.5-325 MG tablet Commonly known as:  NORCO Take 1 tablet by mouth 2 (two) times daily as needed for moderate pain. For chronic pain. To fill on or after 09/18/2017, 10/18/2017, 11/16/2017   nitroGLYCERIN 0.4 MG SL tablet Commonly known as:  NITROSTAT Place 1 tablet (0.4 mg total) under the tongue every 5 (five) minutes as needed for chest pain.   pantoprazole 40 MG tablet Commonly known as:  PROTONIX Take 1 tablet (40 mg total) by mouth daily.   phenytoin 100 MG ER capsule Commonly known as:  DILANTIN TAKE 2 CAPSULES (200MG ) BY MOUTH AT BEDTIME   predniSONE 10 MG tablet Commonly known as:  DELTASONE Take 50 mg daily---taper by 10 mg daily then stop Start taking on:  10/28/2017   QUEtiapine 50 MG tablet Commonly known as:  SEROQUEL Take 1 tablet (50 mg total) by mouth at bedtime.   sertraline 100 MG tablet Commonly known as:  ZOLOFT Take 1 tablet (100 mg total) by mouth  daily.            Durable Medical Equipment  (From admission, onward)         Start     Ordered   10/27/17 1104  For home use only DME oxygen  Once    Question Answer Comment  Mode or (Route) Nasal cannula   Liters per Minute 2   Frequency Continuous (stationary and portable oxygen unit needed)   Oxygen conserving device Yes   Oxygen delivery system Gas      10/27/17 1104          If you experience worsening of your admission symptoms, develop shortness of breath, life threatening emergency, suicidal or homicidal thoughts you must seek medical attention immediately by calling 911 or calling your MD immediately  if symptoms less severe.  You Must read complete instructions/literature along with all the possible adverse reactions/side effects for all the Medicines you take and that have been prescribed to you. Take any new Medicines after you have completely understood and  accept all the possible adverse reactions/side effects.   Please note  You were cared for by a hospitalist during your hospital stay. If you have any questions about your discharge medications or the care you received while you were in the hospital after you are discharged, you can call the unit and asked to speak with the hospitalist on call if the hospitalist that took care of you is not available. Once you are discharged, your primary care physician will handle any further medical issues. Please note that NO REFILLS for any discharge medications will be authorized once you are discharged, as it is imperative that you return to your primary care physician (or establish a relationship with a primary care physician if you do not have one) for your aftercare needs so that they can reassess your need for medications and monitor your lab values. Today   SUBJECTIVE   A bit tearful--remembering her siblings Breathing much improved  VITAL SIGNS:  Blood pressure (!) 130/49, pulse (!) 48, temperature 97.6 F (36.4  C), temperature source Oral, resp. rate 16, height 5\' 5"  (1.651 m), weight 63.9 kg, SpO2 97 %.  I/O:    Intake/Output Summary (Last 24 hours) at 10/27/2017 1117 Last data filed at 10/27/2017 0958 Gross per 24 hour  Intake 635.38 ml  Output 200 ml  Net 435.38 ml    PHYSICAL EXAMINATION:  GENERAL:  84 y.o.-year-old patient lying in the bed with no acute distress.  EYES: Pupils equal, round, reactive to light and accommodation. No scleral icterus. Extraocular muscles intact.  HEENT: Head atraumatic, normocephalic. Oropharynx and nasopharynx clear.  NECK:  Supple, no jugular venous distention. No thyroid enlargement, no tenderness.  LUNGS: Normal breath sounds bilaterally, no wheezing, rales,few rhonchi or crepitation. No use of accessory muscles of respiration.  CARDIOVASCULAR: S1, S2 normal. No murmurs, rubs, or gallops.  ABDOMEN: Soft, non-tender, non-distended. Bowel sounds present. No organomegaly or mass.  EXTREMITIES: No pedal edema, cyanosis, or clubbing.  NEUROLOGIC: Cranial nerves II through XII are intact. Muscle strength 5/5 in all extremities. Sensation intact. Gait not checked.  PSYCHIATRIC: The patient is alert and oriented x 3.  SKIN: No obvious rash, lesion, or ulcer.   DATA REVIEW:   CBC  Recent Labs  Lab 10/25/17 2357  WBC 4.9  HGB 12.3  HCT 37.3  PLT 212    Chemistries  Recent Labs  Lab 10/25/17 2357  NA 141  K 4.9  CL 101  CO2 32  GLUCOSE 106*  BUN 41*  CREATININE 0.78  CALCIUM 8.0*    Microbiology Results   Recent Results (from the past 240 hour(s))  MRSA PCR Screening     Status: None   Collection Time: 10/22/17  7:40 PM  Result Value Ref Range Status   MRSA by PCR NEGATIVE NEGATIVE Final    Comment:        The GeneXpert MRSA Assay (FDA approved for NASAL specimens only), is one component of a comprehensive MRSA colonization surveillance program. It is not intended to diagnose MRSA infection nor to guide or monitor treatment  for MRSA infections. Performed at [facility], [address] [place], [place] [address]     RADIOLOGY:  Dg Chest 2 View  Result Date: 10/25/2017 CLINICAL DATA:  Shortness of breath beginning today. History of asthma. EXAM: CHEST - 2 VIEW COMPARISON:  10/21/2017.  09/07/2017. FINDINGS: Chronic cardiomegaly and aortic atherosclerosis. The right lung remains clear. Density at the left base is largely chronic and likely related to  scarring. There could be an element of active infiltrate and volume loss superimposed. The upper lungs remain clear. No visible effusion. No acute bone finding. IMPRESSION: Chronic cardiomegaly and aortic atherosclerosis. Chronic scarring at the left lung base. There could possibly be some superimposed acute infiltrate and volume loss in that region. Electronically Signed   By: Paulina Fusi M.D.   On: 10/25/2017 16:53     Management plans discussed with the patient, family and they are in agreement.  CODE STATUS:     Code Status Orders  (From admission, onward)         Start     Ordered   10/22/17 0954  Do not attempt resuscitation (DNR)  Continuous    Question Answer Comment  In the event of cardiac or respiratory ARREST Do not call a "code blue"   In the event of cardiac or respiratory ARREST Do not perform Intubation, CPR, defibrillation or ACLS   In the event of cardiac or respiratory ARREST Use medication by any route, position, wound care, and other measures to relive pain and suffering. May use oxygen, suction and manual treatment of airway obstruction as needed for comfort.   Comments RN may pronounce      10/22/17 0954        Code Status History    Date Active Date Inactive Code Status Order ID Comments User Context   10/22/2017 0107 10/22/2017 0953 Full Code [id number]  Oralia Manis, MD Inpatient   09/07/2017 1354 09/08/2017 1933 Full Code [id number]  Shaune Pollack, MD Inpatient   07/11/2016 1416 07/13/2016 1824 Full Code [id number]  Milagros Loll, MD ED      TOTAL TIME TAKING CARE OF THIS PATIENT: *40* minutes.    Enedina Finner M.D on 10/27/2017 at 11:17 AM  Between 7am to 6pm - Pager - [phone] After 6pm go to [email or web address] - Social research officer, government  [facility]  Office  [phone]  CC: Primary care physician; Galen Manila, NP

## 2017-10-27 NOTE — Progress Notes (Signed)
Patient being discharged via wheel to [facility]. Sandy Daniel will be transporting her home.

## 2017-10-27 NOTE — Progress Notes (Signed)
SATURATION QUALIFICATIONS: (This note is used to comply with regulatory documentation for home oxygen)  Patient Saturations on Room Air at Rest = 91%  Patient Saturations on Room Air while Ambulating = 88%  Patient Saturations on 2 Liters of oxygen while Ambulating = 94%  Please briefly explain why patient needs home oxygen: 

## 2017-10-27 NOTE — Clinical Social Work Note (Signed)
The patient will discharge today to her family care home, [facility], via non-emergent EMS due to o2 needs. The facility and patient are aware and in agreement. The CSW will deliver the packet and sign off. RNCM is arranging home o2. Please consult should additional needs arise.  Argentina Ponder, MSW, Theresia Majors [phone]

## 2017-10-27 NOTE — Care Management Note (Signed)
Case Management Note  Patient Details  Name: Sandy Daniel MRN: [id number] Date of Birth: 07/07/1928  Subjective/Objective:  [facility] from [facility] resumption orders in for [facility] for RN and PT. Notified Cheryl with [employer].                    Action/Plan:   Expected Discharge Date:  10/27/17               Expected Discharge Plan:  Home w Home Health Services  In-House Referral:     Discharge planning Services  CM Consult  Post Acute Care Choice:    Choice offered to:  Patient  DME Arranged:  Oxygen DME Agency:  [facility]  HH Arranged:  RN, PT [facility] Agency:  [facility]  Status of Service:  Completed, signed off  If discussed at Long Length of Stay Meetings, dates discussed:    Additional Comments:  Sandy Memos, RN 10/27/2017, 12:51 PM

## 2017-10-27 NOTE — Progress Notes (Signed)
Chaplain was paged for support of the patient. Chaplain was with coded patient at the time(10:16). Once Chaplin could get to Patient he found her sitting up. She is tearful saying no one cares for her. She mentioned her sister Leonie Douglas and Kaylyn Lim. Chaplain has attempted to reach family but with no success, Chaplain practiced pastoral presence and active listening. Chaplain will continue to try to reach family and give follow up.    10/27/17 1200  Clinical Encounter Type  Visited With Patient  Visit Type Follow-up;Spiritual support  Referral From Nurse  Spiritual Encounters  Spiritual Needs Prayer

## 2017-10-28 ENCOUNTER — Telehealth: Payer: Self-pay

## 2017-10-28 NOTE — Telephone Encounter (Signed)
Transition Care Management Follow-up Telephone Call  Date of discharge and from where: 10/27/2017 at [facility]  How have you been since you were released from the hospital? "im pretty weak but im feeling a little bit better"  Any questions or concerns? Yes "I have a question about my seizure medication, I am only supposed to be on dilantin 2 at night but they gave me three big white pills this morning and said they were for seizures too"  Items Reviewed:  Did the pt receive and understand the discharge instructions provided? Yes   Medications obtained and verified? Yes , patient unsure of what new  Medications she is on from the hospital  Any new allergies since your discharge? No   Dietary orders reviewed? Yes  Do you have support at home? Yes   Functional Questionnaire: (I = Independent and D = Dependent) ADLs:   Bathing/Dressing- D  Meal Prep- D  Eating- I  Maintaining continence- I  Transferring/Ambulation- D  Managing Meds- D  Follow up appointments reviewed:   PCP Hospital f/u appt confirmed? Yes  Scheduled to see Elly Modena on 11/08/2017 @ 2:20pm .  Specialist Hospital f/u appt confirmed? NA  Are transportation arrangements needed? Yes   If their condition worsens, is the pt aware to call PCP or go to the Emergency Dept.? Yes  Was the patient provided with contact information for the PCP's office or ED? Yes  Was to pt encouraged to call back with questions or concerns? Yes

## 2017-10-28 NOTE — Telephone Encounter (Signed)
I have made the 1st attempt to contact the patient or family member in charge, in order to follow up from recently being discharged from the hospital. I spoke with front desk at group home, patient was doing her oxygen treatment and requested call back at another time.

## 2017-11-06 ENCOUNTER — Ambulatory Visit: Payer: Medicare HMO | Admitting: Cardiovascular Disease

## 2017-11-08 ENCOUNTER — Encounter: Payer: Self-pay | Admitting: Nurse Practitioner

## 2017-11-08 ENCOUNTER — Other Ambulatory Visit: Payer: Self-pay

## 2017-11-08 ENCOUNTER — Ambulatory Visit (INDEPENDENT_AMBULATORY_CARE_PROVIDER_SITE_OTHER): Payer: Medicare HMO | Admitting: Nurse Practitioner

## 2017-11-08 VITALS — BP 115/48 | HR 64 | Temp 98.5°F | Resp 17 | Ht 65.0 in | Wt 140.8 lb

## 2017-11-08 DIAGNOSIS — J418 Mixed simple and mucopurulent chronic bronchitis: Secondary | ICD-10-CM

## 2017-11-08 DIAGNOSIS — Z23 Encounter for immunization: Secondary | ICD-10-CM | POA: Diagnosis not present

## 2017-11-08 DIAGNOSIS — Z09 Encounter for follow-up examination after completed treatment for conditions other than malignant neoplasm: Secondary | ICD-10-CM | POA: Diagnosis not present

## 2017-11-08 MED ORDER — ASPIRIN 81 MG PO TABS: 81.0000 mg | ORAL_TABLET | Freq: Every day | ORAL | 1 refills | Status: AC

## 2017-11-08 MED ORDER — ASPIRIN 81 MG PO TABS: 81.0000 mg | ORAL_TABLET | Freq: Every day | ORAL | 1 refills | Status: DC

## 2017-11-08 NOTE — Patient Instructions (Addendum)
Sandy Daniel,   Thank you for coming in to clinic today.  1. Continue  Medications as on your list at the back page today.  2. Call clinic if you start having any shortness of breath, coughing, or worsening swelling.  We want to treat you at home and prevent you from going to the hospital if possible.  3. Cancel your 11/15/17 appointment and reschedule for 6 weeks from today.  Please schedule a follow-up appointment with [name], AGNP. Return in about 6 weeks (around 12/20/2017) for COPD, CHF.  If you have any other questions or concerns, please feel free to call the clinic or send a message through MyChart. You may also schedule an earlier appointment if necessary.  You will receive a survey after today's visit either digitally by e-mail or paper by [employer]. Your experiences and feedback matter to [place].  Please respond so we know how we are doing as we provide care for you.   [name], DNP, AGNP-BC Adult Gerontology Nurse Practitioner [facility], [facility]   Chronic Obstructive Pulmonary Disease Chronic obstructive pulmonary disease (COPD) is a long-term (chronic) condition that affects the lungs. COPD is a general term that can be used to describe many different lung problems that cause lung swelling (inflammation) and limit airflow, including chronic bronchitis and emphysema. If you have COPD, your lung function will probably never return to normal. In most cases, it gets worse over time. However, there are steps you can take to slow the progression of the disease and improve your quality of life. What are the causes? This condition may be caused by:  Smoking. This is the most common cause.  Certain genes passed down through families.  What increases the risk? The following factors may make you more likely to develop this condition:  Secondhand smoke from cigarettes, pipes, or cigars.  Exposure to chemicals and other irritants such as fumes and dust  in the work environment.  Chronic lung conditions or infections.  What are the signs or symptoms? Symptoms of this condition include:  Shortness of breath, especially during physical activity.  Chronic cough with a large amount of thick mucus. Sometimes the cough may not have any mucus (dry cough).  Wheezing.  Rapid breaths.  Gray or bluish discoloration (cyanosis) of the skin, especially in your fingers, toes, or lips.  Feeling tired (fatigue).  Weight loss.  Chest tightness.  Frequent infections.  Episodes when breathing symptoms become much worse (exacerbations).  Swelling in the ankles, feet, or legs. This may occur in later stages of the disease.  How is this diagnosed? This condition is diagnosed based on:  Your medical history.  A physical exam.  You may also have tests, including:  Lung (pulmonary) function tests. This may include a spirometry test, which measures your ability to exhale properly.  Chest X-ray.  CT scan.  Blood tests.  How is this treated? This condition may be treated with:  Medicines. These may include inhaled rescue medicines to treat acute exacerbations as well as long-term, or maintenance, medicines to prevent flare-ups of COPD. ? Bronchodilators help treat COPD by dilating the airways to allow increased airflow and make your breathing more comfortable. ? Steroids can reduce airway inflammation and help prevent exacerbations.  Smoking cessation. If you smoke, your health care provider may ask you to quit, and may also recommend therapy or replacement products to help you quit.  Pulmonary rehabilitation. This may involve working with a team of health care providers  and specialists, such as respiratory, occupational, and physical therapists.  Exercise and physical activity. These are beneficial for nearly all people with COPD.  Nutrition therapy to gain weight, if you are underweight.  Oxygen. Supplemental oxygen therapy is only  helpful if you have a low oxygen level in your blood (hypoxemia).  Lung surgery or transplant.  Palliative care. This is to help people with COPD feel comfortable when treatment is no longer working.  Follow these instructions at home: Medicines  Take over-the-counter and prescription medicines (inhaled or pills) only as told by your health care provider.  Talk to your health care provider before taking any cough or allergy medicines. You may need to avoid certain medicines that dry out your airways. Lifestyle  If you are a smoker, the most important thing that you can do is to stop smoking. Do not use any products that contain nicotine or tobacco, such as cigarettes and e-cigarettes. If you need help quitting, ask your health care provider. Continuing to smoke will cause the disease to progress faster.  Avoid exposure to things that irritate your lungs, such as smoke, chemicals, and fumes.  Stay active, but balance activity with periods of rest. Exercise and physical activity will help you maintain your ability to do things you want to do.  Learn and use relaxation techniques to manage stress and to control your breathing.  Get the right amount of sleep and get quality sleep. Most adults need 7 or more hours per night.  Eat healthy foods. Eating smaller, more frequent meals and resting before meals may help you maintain your strength. Controlled breathing Learn and use controlled breathing techniques as directed by your health care provider. Controlled breathing techniques include:  Pursed lip breathing. Start by breathing in (inhaling) through your nose for 1 second. Then, purse your lips as if you were going to whistle and breathe out (exhale) through the pursed lips for 2 seconds.  Diaphragmatic breathing. Start by putting one hand on your abdomen just above your waist. Inhale slowly through your nose. The hand on your abdomen should move out. Then purse your lips and exhale slowly.  You should be able to feel the hand on your abdomen moving in as you exhale.  Controlled coughing Learn and use controlled coughing to clear mucus from your lungs. Controlled coughing is a series of short, progressive coughs. The steps of controlled coughing are: 1. Lean your head slightly forward. 2. Breathe in deeply using diaphragmatic breathing. 3. Try to hold your breath for 3 seconds. 4. Keep your mouth slightly open while coughing twice. 5. Spit any mucus out into a tissue. 6. Rest and repeat the steps once or twice as needed.  General instructions  Make sure you receive all the vaccines that your health care provider recommends, especially the pneumococcal and influenza vaccines. Preventing infection and hospitalization is very important when you have COPD.  Use oxygen therapy and pulmonary rehabilitation if directed to by your health care provider. If you require home oxygen therapy, ask your health care provider whether you should purchase a pulse oximeter to measure your oxygen level at home.  Work with your health care provider to develop a COPD action plan. This will help you know what steps to take if your condition gets worse.  Keep other chronic health conditions under control as told by your health care provider.  Avoid extreme temperature and humidity changes.  Avoid contact with people who have an illness that spreads from person to person (  is contagious), such as viral infections or pneumonia.  Keep all follow-up visits as told by your health care provider. This is important. Contact a health care provider if:  You are coughing up more mucus than usual.  There is a change in the color or thickness of your mucus.  Your breathing is more labored than usual.  Your breathing is faster than usual.  You have difficulty sleeping.  You need to use your rescue medicines or inhalers more often than expected.  You have trouble doing routine activities such as getting  dressed or walking around the house. Get help right away if:  You have shortness of breath while you are resting.  You have shortness of breath that prevents you from: ? Being able to talk. ? Performing your usual physical activities.  You have chest pain lasting longer than 5 minutes.  Your skin color is more blue (cyanotic) than usual.  You measure low oxygen saturations for longer than 5 minutes with a pulse oximeter.  You have a fever.  You feel too tired to breathe normally. Summary  Chronic obstructive pulmonary disease (COPD) is a long-term (chronic) condition that affects the lungs.  Your lung function will probably never return to normal. In most cases, it gets worse over time. However, there are steps you can take to slow the progression of the disease and improve your quality of life.  Treatment for COPD may include taking medicines, quitting smoking, pulmonary rehabilitation, and changes to diet and exercise. As the disease progresses, you may need oxygen therapy, a lung transplant, or palliative care.  To help manage your condition, do not smoke, avoid exposure to things that irritate your lungs, stay up to date on all vaccines, and follow your health care provider's instructions for taking medicines. This information is not intended to replace advice given to you by your health care provider. Make sure you discuss any questions you have with your health care provider. Document Released: 11/08/2004 Document Revised: 03/05/2016 Document Reviewed: 03/05/2016 Elsevier Interactive Patient Education  [employer].

## 2017-11-08 NOTE — Progress Notes (Signed)
Subjective:    Patient ID: Sandy Daniel, female    DOB: [birth year], 84 y.o.   MRN: [id number]  Sandy Daniel is a 84 y.o. female presenting on 11/08/2017 for Hospitalization Follow-up (congestive heart failure)  HPI HOSPITAL FOLLOW-UP VISIT  Hospital/Location: [facility] Date of Admission: 10/21/2017 Date of Discharge: 10/27/2017 Transitions of care telephone call: 10/28/2017  Reason for Admission: COPD exacerbatioh Primary (+Secondary) Diagnosis: COPD exacerbation (Acure respiratory failure with hypoxia, CHF acute on chronic)  FOLLOW-UP  - Hospital H&P and Discharge Summary have been reviewed - Patient presents today 12 days after recent hospitalization. Brief summary of recent course, patient had symptoms of shortness of breath, hospitalized, had hypoxia, treated with respiratory support and antibiotics. - New medications on discharge: home oxygen, duonebs, albuterol - Changes to current meds on discharge: none  - Today reports overall has done well after discharge. Symptoms of shortness of breath are much improved.  She notes continued weakness of lower extremities and would like a LEFT knee brace because her knee buckles.  Social History   Tobacco Use  . Smoking status: Current Every Day Smoker    Packs/day: 1.00    Years: 50.00    Pack years: 50.00    Types: Cigarettes  . Smokeless tobacco: Never Used  . Tobacco comment: 10 a day   Substance Use Topics  . Alcohol use: No  . Drug use: No    Review of Systems Per HPI unless specifically indicated above  Outpatient Encounter Medications as of 11/08/2017  Medication Sig  . albuterol (PROVENTIL HFA;VENTOLIN HFA) 108 (90 Base) MCG/ACT inhaler Inhale 2 puffs into the lungs every 6 (six) hours as needed for wheezing or shortness of breath.  Marland Kitchen albuterol (PROVENTIL) (2.5 MG/3ML) 0.083% nebulizer solution Take 3 mLs (2.5 mg total) by nebulization every 6 (six) hours as needed for Wheezing.  Marland Kitchen aspirin 81 MG tablet Take 1  tablet (81 mg total) by mouth daily.  Marland Kitchen atorvastatin (LIPITOR) 10 MG tablet Take 1 tablet (10 mg total) by mouth daily.  . busPIRone (BUSPAR) 10 MG tablet Take 1 tablet (10 mg total) by mouth 2 (two) times daily.  . cefdinir (OMNICEF) 300 MG capsule Take 1 capsule (300 mg total) by mouth every 12 (twelve) hours.  . Fluticasone-Salmeterol (ADVAIR) 100-50 MCG/DOSE AEPB Inhale 1 puff into the lungs 2 (two) times daily.  . furosemide (LASIX) 20 MG tablet Take 0.5 tablets (10 mg total) by mouth daily.  Marland Kitchen gabapentin (NEURONTIN) 400 MG capsule Take 2 capsules (800 mg total) by mouth 3 (three) times daily.  Marland Kitchen guaiFENesin-dextromethorphan (ROBITUSSIN DM) 100-10 MG/5ML syrup Take 15 mLs by mouth every 6 (six) hours as needed for cough.  Marland Kitchen HYDROcodone-acetaminophen (NORCO) 7.5-325 MG tablet Take 1 tablet by mouth 2 (two) times daily as needed for moderate pain. For chronic pain. To fill on or after 09/18/2017, 10/18/2017, 11/16/2017  . nitroGLYCERIN (NITROSTAT) 0.4 MG SL tablet Place 1 tablet (0.4 mg total) under the tongue every 5 (five) minutes as needed for chest pain.  . pantoprazole (PROTONIX) 40 MG tablet Take 1 tablet (40 mg total) by mouth daily.  . phenytoin (DILANTIN) 100 MG ER capsule TAKE 2 CAPSULES (200MG ) BY MOUTH AT BEDTIME  . predniSONE (DELTASONE) 10 MG tablet Take 50 mg daily---taper by 10 mg daily then stop  . QUEtiapine (SEROQUEL) 50 MG tablet Take 1 tablet (50 mg total) by mouth at bedtime.  . sertraline (ZOLOFT) 100 MG tablet Take 1 tablet (100 mg total) by mouth daily.  No facility-administered encounter medications on file as of 11/08/2017.        Objective:    BP (!) 115/48 (BP Location: Left Arm, Patient Position: Sitting, Cuff Size: Normal)   Pulse 64   Temp 98.5 F (36.9 C) (Oral)   Resp 17   Ht 5\' 5"  (1.651 m)   Wt 140 lb 12.8 oz (63.9 kg)   SpO2 94%   BMI 23.43 kg/m   Wt Readings from Last 3 Encounters:  11/08/17 140 lb 12.8 oz (63.9 kg)  10/27/17 140 lb 14 oz (63.9  kg)  09/17/17 143 lb (64.9 kg)    Physical Exam  Constitutional: She is oriented to person, place, and time. She appears well-developed and well-nourished. No distress.  HENT:  Head: Normocephalic and atraumatic.  Cardiovascular: Normal rate, regular rhythm, S1 normal, S2 normal, normal heart sounds and intact distal pulses.  Pulmonary/Chest: Effort normal and breath sounds normal. No respiratory distress. She has no wheezes. She has no rales.  Neurological: She is alert and oriented to person, place, and time.  Skin: Skin is warm and dry.  Psychiatric: She has a normal mood and affect. Her behavior is normal.  Vitals reviewed.  Results for orders placed or performed during the hospital encounter of 10/21/17  MRSA PCR Screening  Result Value Ref Range   MRSA by PCR NEGATIVE NEGATIVE  CBC with Differential/Platelet  Result Value Ref Range   WBC 4.5 3.6 - 11.0 K/uL   RBC 4.50 3.80 - 5.20 MIL/uL   Hemoglobin 13.9 12.0 - 16.0 g/dL   HCT 13.0 86.5 - 78.4 %   MCV 90.9 80.0 - 100.0 fL   MCH 30.8 26.0 - 34.0 pg   MCHC 33.9 32.0 - 36.0 g/dL   RDW 69.6 (H) 29.5 - 28.4 %   Platelets 220 150 - 440 K/uL   Neutrophils Relative % 42 %   Neutro Abs 1.9 1.4 - 6.5 K/uL   Lymphocytes Relative 44 %   Lymphs Abs 2.0 1.0 - 3.6 K/uL   Monocytes Relative 13 %   Monocytes Absolute 0.6 0.2 - 0.9 K/uL   Eosinophils Relative 1 %   Eosinophils Absolute 0.0 0 - 0.7 K/uL   Basophils Relative 0 %   Basophils Absolute 0.0 0 - 0.1 K/uL  Basic metabolic panel  Result Value Ref Range   Sodium 142 135 - 145 mmol/L   Potassium 3.6 3.5 - 5.1 mmol/L   Chloride 104 98 - 111 mmol/L   CO2 29 22 - 32 mmol/L   Glucose, Bld 124 (H) 70 - 99 mg/dL   BUN 15 8 - 23 mg/dL   Creatinine, Ser 1.32 0.44 - 1.00 mg/dL   Calcium 8.4 (L) 8.9 - 10.3 mg/dL   GFR calc non Af Amer >60 >60 mL/min   GFR calc Af Amer >60 >60 mL/min   Anion gap 9 5 - 15  Blood gas, venous  Result Value Ref Range   pH, Ven 7.34 7.250 - 7.430    pCO2, Ven 59 44.0 - 60.0 mmHg   pO2, Ven 35.0 32.0 - 45.0 mmHg   Bicarbonate 31.8 (H) 20.0 - 28.0 mmol/L   Acid-Base Excess 4.3 (H) 0.0 - 2.0 mmol/L   O2 Saturation 63.0 %   Patient temperature 37.0    Collection site LINE    Sample type ARTERIAL DRAW   Lactic acid, plasma  Result Value Ref Range   Lactic Acid, Venous 1.1 0.5 - 1.9 mmol/L  Lactic acid, plasma  Result Value Ref Range   Lactic Acid, Venous 1.0 0.5 - 1.9 mmol/L  Brain natriuretic peptide  Result Value Ref Range   B Natriuretic Peptide 281.0 (H) 0.0 - 100.0 pg/mL  Basic metabolic panel  Result Value Ref Range   Sodium 141 135 - 145 mmol/L   Potassium 3.5 3.5 - 5.1 mmol/L   Chloride 105 98 - 111 mmol/L   CO2 30 22 - 32 mmol/L   Glucose, Bld 140 (H) 70 - 99 mg/dL   BUN 14 8 - 23 mg/dL   Creatinine, Ser 3.76 0.44 - 1.00 mg/dL   Calcium 8.5 (L) 8.9 - 10.3 mg/dL   GFR calc non Af Amer >60 >60 mL/min   GFR calc Af Amer >60 >60 mL/min   Anion gap 6 5 - 15  CBC  Result Value Ref Range   WBC 3.5 (L) 3.6 - 11.0 K/uL   RBC 4.33 3.80 - 5.20 MIL/uL   Hemoglobin 13.3 12.0 - 16.0 g/dL   HCT 28.3 15.1 - 76.1 %   MCV 89.7 80.0 - 100.0 fL   MCH 30.8 26.0 - 34.0 pg   MCHC 34.3 32.0 - 36.0 g/dL   RDW 60.7 (H) 37.1 - 06.2 %   Platelets 211 150 - 440 K/uL  CBC  Result Value Ref Range   WBC 4.9 3.6 - 11.0 K/uL   RBC 4.20 3.80 - 5.20 MIL/uL   Hemoglobin 12.3 12.0 - 16.0 g/dL   HCT 69.4 85.4 - 62.7 %   MCV 89.0 80.0 - 100.0 fL   MCH 29.4 26.0 - 34.0 pg   MCHC 33.1 32.0 - 36.0 g/dL   RDW 03.5 (H) 00.9 - 38.1 %   Platelets 212 150 - 440 K/uL  Basic metabolic panel  Result Value Ref Range   Sodium 141 135 - 145 mmol/L   Potassium 4.9 3.5 - 5.1 mmol/L   Chloride 101 98 - 111 mmol/L   CO2 32 22 - 32 mmol/L   Glucose, Bld 106 (H) 70 - 99 mg/dL   BUN 41 (H) 8 - 23 mg/dL   Creatinine, Ser 8.29 0.44 - 1.00 mg/dL   Calcium 8.0 (L) 8.9 - 10.3 mg/dL   GFR calc non Af Amer >60 >60 mL/min   GFR calc Af Amer >60 >60 mL/min    Anion gap 8 5 - 15  ECHOCARDIOGRAM COMPLETE  Result Value Ref Range   Weight 2,243.2 oz   Height 65 in   BP 155/63 mmHg      Assessment & Plan:   Problem List Items Addressed This Visit      Respiratory   COPD (chronic obstructive pulmonary disease) (HCC)    Other Visit Diagnoses    Hospital discharge follow-up    -  Primary   Need for immunization against influenza       Relevant Orders   Flu vaccine HIGH DOSE PF (Fluzone High dose) (Completed)    # hospital discharge followup for COPD Patient now stable at home on 2L O2 and regaining strength slowly.  No acute needs today.   Plan: 1. Reviewed emergency signs and symptoms of breathing difficulty and when to call office vs go to ER. 2. Continue home O2, inhalers, and nebs today. 3. Provided info about COPD exacerbation 4. Follow-up 6 weeks.  # Pt > age 20.  Needs annual influenza vaccine.  Plan: 1. Administer high dose fluzone today.    Meds ordered this encounter  Medications  . DISCONTD: aspirin 81  MG tablet    Sig: Take 1 tablet (81 mg total) by mouth daily.    Dispense:  30 tablet    Refill:  1    Order Specific Question:   Supervising Provider    Answer:   Smitty Cords [[id number]]  . aspirin 81 MG tablet    Sig: Take 1 tablet (81 mg total) by mouth daily.    Dispense:  30 tablet    Refill:  1    Order Specific Question:   Supervising Provider    Answer:   Smitty Cords [[id number]]   I have reviewed the admission H&P, hospital notes, discharge summary, discharge medication list, and have reconciled the current and discharge medications today.  Follow up plan: Return in about 6 weeks (around 12/20/2017) for COPD, CHF.  A total of 27 minutes was spent face-to-face with this patient. Greater than 50% of this time was spent in counseling and coordination of care with the patient regarding COPD disease, rehabilitation from decompensation after hospitalization.    [name], DNP,  AGPCNP-BC Adult Gerontology Primary Care Nurse Practitioner [facility] 11/08/2017, 2:42 PM

## 2017-11-12 ENCOUNTER — Encounter: Payer: Self-pay | Admitting: Nurse Practitioner

## 2017-11-15 ENCOUNTER — Ambulatory Visit: Payer: Self-pay | Admitting: Nurse Practitioner

## 2017-11-17 NOTE — Progress Notes (Signed)
Cardiology Office Note  Date:  11/18/2017   ID:  Sandy Daniel, DOB [birth year], MRN [id number]  PCP:  Sandy Manila, NP   Chief Complaint  Patient presents with  . other    Follow up from [facility]; CHF & Chest pain. Pt. saw Dr. Alvino Chapel 04/19/2016. Meds reviewed by the pt.'s bottles. "doing well."     HPI:  Sandy Daniel is a 84 y.o. female  with past medical history of  coronary artery disease Stent to the proximal LAD  April 2015  drug-eluting stent for a 95% stenosis in the left circumflex.   prior stent in the proximal LAD was widely patent.  She had mild disease of the RCA.  seizures Chronic shortness of breath Who presents for follow-up of her coronary artery disease  Recent hospital admission October 27, 2017 acute hypoxic respiratory failure secondary to COPD, pneumonia Hospital records reviewed with the patient in detail Treated with antibiotics and steroids, neb lasers Was given 1 dose of Lasix.  No significant pulmonary edema reported Small to moderate sized pericardial effusion was noted Small effusion was noted May 2018  In follow-up today she is complaining of chronic neck pain Several years ago heard a pop in her neck, severe pain " Nobody doing anything about it" Hurts her at times now but not as bad as before  Denies any significant chest pain with exertion Discussed her smoking with her, that she stopped for long period of time and restarted when her husband was ill  Walks with a cane and a walker Mild chronic shortness of breath with exertion Medication reviewed with caretaker today On aspirin Lipitor 10 daily Lasix 10 daily  EKG personally reviewed by myself on todays visit Shows normal sinus rhythm rate 72 bpm APCs consider old anterior MI, old inferior MI  Other past medical records reviewed Previously hospitalized for seizures, reported having chest pain diagnostic cardiac catheterization revealing wide patency of previously placed  stents, no new lesions.   cath 04/05/2015: SUMMARY:  --HEMODYNAMICS: --Hemodynamic assessment demonstrates normal hemodynamics.  --CARDIAC STRUCTURES: --EF calculated by contrast ventriculography was 55 %.  IMPRESSIONS: 1- minimal nonobstructive atherosclerotic coronary artery disease with prior stents in the LAD & circumflex territory, widely patent.  2- normal left ventricular systolic function with ejection fraction greater than 55%  3- left ventricular end-diastolic pressure is around 18 mmHg with no gradient across the aortic valve   Echo 06/12/2014: A complete two-dimensional transthoracic echocardiogram was performed (2D, M-mode, Doppler and color flow Doppler). The study was technically difficult. Normal global left ventriuclar systolic function. Grade I diastolic dysfunction. Mild mitral and aortic regugitation.    PMH:   has a past medical history of Arthritis, Asthma, B12 deficiency, CAD (coronary artery disease), COPD (chronic obstructive pulmonary disease) (HCC), Depression, Epilepsy (HCC), GERD (gastroesophageal reflux disease), Hyperlipidemia, Hypertension, Hypoxemia, Macular degeneration (senile) of retina, Macular degeneration of both eyes, MI (myocardial infarction) (HCC) (09/2003, 05/2013), Physiological tremor, Presence of stent of CABG, and Seizures (HCC).  PSH:    Past Surgical History:  Procedure Laterality Date  . ABDOMINAL HYSTERECTOMY    . APPENDECTOMY    . CARDIAC CATHETERIZATION  05/2013  . CARDIAC SURGERY    . CATARACT EXTRACTION  03/2016  . CHOLECYSTECTOMY  1962  . CORONARY ANGIOPLASTY WITH STENT PLACEMENT  Aug. 2005 and March 2015  . ROTATOR CUFF REPAIR Right   . VENTRAL HERNIA REPAIR  2002    Current Outpatient Medications  Medication Sig Dispense Refill  . albuterol (PROVENTIL HFA;VENTOLIN  HFA) 108 (90 Base) MCG/ACT inhaler Inhale 2 puffs into the lungs every 6 (six) hours as needed for wheezing or shortness of breath. 1  Inhaler 1  . albuterol (PROVENTIL) (2.5 MG/3ML) 0.083% nebulizer solution Take 3 mLs (2.5 mg total) by nebulization every 6 (six) hours as needed for Wheezing. 75 mL 1  . aspirin 81 MG tablet Take 1 tablet (81 mg total) by mouth daily. 30 tablet 1  . atorvastatin (LIPITOR) 10 MG tablet Take 1 tablet (10 mg total) by mouth daily. 90 tablet 1  . busPIRone (BUSPAR) 10 MG tablet Take 1 tablet (10 mg total) by mouth 2 (two) times daily. 180 tablet 1  . Fluticasone-Salmeterol (ADVAIR) 100-50 MCG/DOSE AEPB Inhale 1 puff into the lungs 2 (two) times daily. 1 each 1  . furosemide (LASIX) 20 MG tablet Take 0.5 tablets (10 mg total) by mouth daily. 45 tablet 1  . gabapentin (NEURONTIN) 400 MG capsule Take 2 capsules (800 mg total) by mouth 3 (three) times daily. 540 capsule 1  . HYDROcodone-acetaminophen (NORCO) 7.5-325 MG tablet Take 1 tablet by mouth 2 (two) times daily as needed for moderate pain. For chronic pain. To fill on or after 09/18/2017, 10/18/2017, 11/16/2017 60 tablet 0  . nitroGLYCERIN (NITROSTAT) 0.4 MG SL tablet Place 1 tablet (0.4 mg total) under the tongue every 5 (five) minutes as needed for chest pain. 20 tablet 1  . pantoprazole (PROTONIX) 40 MG tablet Take 1 tablet (40 mg total) by mouth daily. 90 tablet 1  . phenytoin (DILANTIN) 100 MG ER capsule TAKE 2 CAPSULES (200MG ) BY MOUTH AT BEDTIME 30 capsule 5  . QUEtiapine (SEROQUEL) 50 MG tablet Take 1 tablet (50 mg total) by mouth at bedtime. 90 tablet 1  . sertraline (ZOLOFT) 100 MG tablet Take 1 tablet (100 mg total) by mouth daily. 90 tablet 1   No current facility-administered medications for this visit.      Allergies:   Motrin [ibuprofen]; Tolmetin; Alendronate; Other; and Tramadol hcl   Social History:  The patient  reports that she has been smoking cigarettes. She has a 50.00 pack-year smoking history. She has never used smokeless tobacco. She reports that she does not drink alcohol or use drugs.   Family History:   family history  includes Cancer in her brother and sister; Diabetes in her brother, brother, brother, mother, and sister; Heart attack in her brother and sister; Heart disease in her brother and sister; Hyperlipidemia in her brother and sister; Hypertension in her brother and sister; Kidney failure in her brother.    Review of Systems: Review of Systems  Constitutional: Negative.   Respiratory: Positive for shortness of breath.   Cardiovascular: Negative.   Gastrointestinal: Negative.   Musculoskeletal: Positive for neck pain.  Neurological: Negative.   Psychiatric/Behavioral: Negative.   All other systems reviewed and are negative.   PHYSICAL EXAM: VS:  BP 126/70 (BP Location: Left Arm, Patient Position: Sitting, Cuff Size: Normal)   Pulse 72   Ht 5' (1.524 m)   Wt 142 lb (64.4 kg)   BMI 27.73 kg/m  , BMI Body mass index is 27.73 kg/m. GEN: Well nourished, well developed, in no acute distress , ambulating with a cane, mild gait instability HEENT: normal  Neck: no JVD, carotid bruits, or masses Cardiac: RRR; no murmurs, rubs, or gallops,no edema  Respiratory: Moderately decreased breath sounds throughout, normal work of breathing GI: soft, nontender, nondistended, + BS MS: no deformity or atrophy  Skin: warm and dry, no  rash Neuro:  Strength and sensation are intact Psych: euthymic mood, full affect   Recent Labs: 08/13/2017: ALT 26 10/21/2017: B Natriuretic Peptide 281.0 10/25/2017: BUN 41; Creatinine, Ser 0.78; Hemoglobin 12.3; Platelets 212; Potassium 4.9; Sodium 141    Lipid Panel Lab Results  Component Value Date   CHOL 126 09/08/2017   HDL 45 09/08/2017   LDLCALC 68 09/08/2017   TRIG 63 09/08/2017      Wt Readings from Last 3 Encounters:  11/18/17 142 lb (64.4 kg)  11/08/17 140 lb 12.8 oz (63.9 kg)  10/27/17 140 lb 14 oz (63.9 kg)       ASSESSMENT AND PLAN:  Coronary artery disease of native artery of native heart with stable angina pectoris (HCC) - Plan: EKG  12-Lead Currently with no symptoms of angina. No further workup at this time. Continue current medication regimen. Recommended smoking cessation  Chronic heart failure with preserved ejection fraction (HCC) - Plan: EKG 12-Lead Appears relatively euvolemic Recent hospital admission for COPD exacerbation  Chronic obstructive pulmonary disease, unspecified COPD type (HCC) Finished antibiotics and steroids Uses inhalers in the morning nebulizer at night Sleeps with oxygen  Essential hypertension Blood pressure is well controlled on today's visit. No changes made to the medications.  Mixed hyperlipidemia Lipids at goal on current medication  Disposition:   F/U  12 months   Total encounter time more than 25 minutes  Greater than 50% was spent in counseling and coordination of care with the patient    Orders Placed This Encounter  Procedures  . EKG 12-Lead     Signed, Dossie Arbour, M.D., Ph.D. 11/18/2017  [facility] [place], [place] [phone]

## 2017-11-18 ENCOUNTER — Ambulatory Visit (INDEPENDENT_AMBULATORY_CARE_PROVIDER_SITE_OTHER): Payer: Medicare HMO | Admitting: Cardiovascular Disease

## 2017-11-18 ENCOUNTER — Encounter: Payer: Self-pay | Admitting: Cardiovascular Disease

## 2017-11-18 VITALS — BP 126/70 | HR 72 | Ht 60.0 in | Wt 142.0 lb

## 2017-11-18 DIAGNOSIS — I5032 Chronic diastolic (congestive) heart failure: Secondary | ICD-10-CM | POA: Diagnosis not present

## 2017-11-18 DIAGNOSIS — J441 Chronic obstructive pulmonary disease with (acute) exacerbation: Secondary | ICD-10-CM

## 2017-11-18 DIAGNOSIS — I25118 Atherosclerotic heart disease of native coronary artery with other forms of angina pectoris: Secondary | ICD-10-CM | POA: Diagnosis not present

## 2017-11-18 DIAGNOSIS — E782 Mixed hyperlipidemia: Secondary | ICD-10-CM

## 2017-11-18 DIAGNOSIS — I10 Essential (primary) hypertension: Secondary | ICD-10-CM | POA: Diagnosis not present

## 2017-11-18 DIAGNOSIS — J449 Chronic obstructive pulmonary disease, unspecified: Secondary | ICD-10-CM

## 2017-11-18 NOTE — Patient Instructions (Signed)
Try to quit smoking  Medication Instructions:   No medication changes made  Labwork:  No new labs needed  Testing/Procedures:  No further testing at this time   Follow-Up: It was a pleasure seeing you in the office today. Please call us if you have new issues that need to be addressed before your next appt.  [phone]  Your physician wants you to follow-up in: 12 months.  You will receive a reminder letter in the mail two months in advance. If you don't receive a letter, please call our office to schedule the follow-up appointment.  If you need a refill on your cardiac medications before your next appointment, please call your pharmacy.  For educational health videos Log in to : [email or web address] Or : [email or web address], password : [email or web address]

## 2017-11-22 ENCOUNTER — Telehealth: Payer: Self-pay | Admitting: Nurse Practitioner

## 2017-11-22 NOTE — Telephone Encounter (Signed)
Received a call from [facility] [phone] requesting a

## 2017-11-28 ENCOUNTER — Telehealth: Payer: Self-pay | Admitting: Nurse Practitioner

## 2017-11-28 DIAGNOSIS — R2681 Unsteadiness on feet: Secondary | ICD-10-CM

## 2017-11-28 NOTE — Telephone Encounter (Signed)
Order written.  Dr. Kirtland Bouchard may need to sign/certify need.

## 2017-11-28 NOTE — Telephone Encounter (Signed)
Sandy Daniel with [facility] needs a prescription faxed to [facility] for a 2 wheel walker [phone].  Her call back number is [phone]

## 2017-12-01 ENCOUNTER — Encounter: Payer: Self-pay | Admitting: Emergency Medicine

## 2017-12-01 ENCOUNTER — Other Ambulatory Visit: Payer: Self-pay

## 2017-12-01 ENCOUNTER — Emergency Department: Payer: Medicare HMO

## 2017-12-01 ENCOUNTER — Inpatient Hospital Stay
Admission: EM | Admit: 2017-12-01 | Discharge: 2017-12-06 | DRG: 482 | Disposition: A | Discharge status: Skilled Nursing Facility | Payer: Medicare HMO | Attending: Internal Medicine | Admitting: Internal Medicine

## 2017-12-01 DIAGNOSIS — Z66 Do not resuscitate: Secondary | ICD-10-CM | POA: Diagnosis present

## 2017-12-01 DIAGNOSIS — I252 Old myocardial infarction: Secondary | ICD-10-CM

## 2017-12-01 DIAGNOSIS — E785 Hyperlipidemia, unspecified: Secondary | ICD-10-CM | POA: Diagnosis present

## 2017-12-01 DIAGNOSIS — R52 Pain, unspecified: Secondary | ICD-10-CM

## 2017-12-01 DIAGNOSIS — G40909 Epilepsy, unspecified, not intractable, without status epilepticus: Secondary | ICD-10-CM | POA: Diagnosis present

## 2017-12-01 DIAGNOSIS — H3530 Unspecified macular degeneration: Secondary | ICD-10-CM | POA: Diagnosis present

## 2017-12-01 DIAGNOSIS — Z955 Presence of coronary angioplasty implant and graft: Secondary | ICD-10-CM | POA: Diagnosis not present

## 2017-12-01 DIAGNOSIS — M25552 Pain in left hip: Secondary | ICD-10-CM | POA: Diagnosis present

## 2017-12-01 DIAGNOSIS — K5900 Constipation, unspecified: Secondary | ICD-10-CM | POA: Diagnosis not present

## 2017-12-01 DIAGNOSIS — I2510 Atherosclerotic heart disease of native coronary artery without angina pectoris: Secondary | ICD-10-CM | POA: Diagnosis present

## 2017-12-01 DIAGNOSIS — M1712 Unilateral primary osteoarthritis, left knee: Secondary | ICD-10-CM | POA: Diagnosis present

## 2017-12-01 DIAGNOSIS — Z90710 Acquired absence of both cervix and uterus: Secondary | ICD-10-CM | POA: Diagnosis not present

## 2017-12-01 DIAGNOSIS — Z7951 Long term (current) use of inhaled steroids: Secondary | ICD-10-CM

## 2017-12-01 DIAGNOSIS — W1830XA Fall on same level, unspecified, initial encounter: Secondary | ICD-10-CM | POA: Diagnosis present

## 2017-12-01 DIAGNOSIS — F329 Major depressive disorder, single episode, unspecified: Secondary | ICD-10-CM | POA: Diagnosis present

## 2017-12-01 DIAGNOSIS — I119 Hypertensive heart disease without heart failure: Secondary | ICD-10-CM | POA: Diagnosis present

## 2017-12-01 DIAGNOSIS — Z7982 Long term (current) use of aspirin: Secondary | ICD-10-CM

## 2017-12-01 DIAGNOSIS — Z8349 Family history of other endocrine, nutritional and metabolic diseases: Secondary | ICD-10-CM

## 2017-12-01 DIAGNOSIS — Z9849 Cataract extraction status, unspecified eye: Secondary | ICD-10-CM

## 2017-12-01 DIAGNOSIS — S72142A Displaced intertrochanteric fracture of left femur, initial encounter for closed fracture: Secondary | ICD-10-CM | POA: Diagnosis present

## 2017-12-01 DIAGNOSIS — F17210 Nicotine dependence, cigarettes, uncomplicated: Secondary | ICD-10-CM | POA: Diagnosis present

## 2017-12-01 DIAGNOSIS — Z8249 Family history of ischemic heart disease and other diseases of the circulatory system: Secondary | ICD-10-CM

## 2017-12-01 DIAGNOSIS — S72009A Fracture of unspecified part of neck of unspecified femur, initial encounter for closed fracture: Secondary | ICD-10-CM | POA: Diagnosis present

## 2017-12-01 DIAGNOSIS — Z841 Family history of disorders of kidney and ureter: Secondary | ICD-10-CM

## 2017-12-01 DIAGNOSIS — Z79899 Other long term (current) drug therapy: Secondary | ICD-10-CM

## 2017-12-01 DIAGNOSIS — Z9049 Acquired absence of other specified parts of digestive tract: Secondary | ICD-10-CM | POA: Diagnosis not present

## 2017-12-01 DIAGNOSIS — F419 Anxiety disorder, unspecified: Secondary | ICD-10-CM | POA: Diagnosis present

## 2017-12-01 DIAGNOSIS — Z886 Allergy status to analgesic agent status: Secondary | ICD-10-CM

## 2017-12-01 DIAGNOSIS — Z888 Allergy status to other drugs, medicaments and biological substances status: Secondary | ICD-10-CM

## 2017-12-01 DIAGNOSIS — Z833 Family history of diabetes mellitus: Secondary | ICD-10-CM

## 2017-12-01 DIAGNOSIS — Z419 Encounter for procedure for purposes other than remedying health state, unspecified: Secondary | ICD-10-CM

## 2017-12-01 DIAGNOSIS — K219 Gastro-esophageal reflux disease without esophagitis: Secondary | ICD-10-CM | POA: Diagnosis present

## 2017-12-01 DIAGNOSIS — Z9981 Dependence on supplemental oxygen: Secondary | ICD-10-CM

## 2017-12-01 DIAGNOSIS — Z951 Presence of aortocoronary bypass graft: Secondary | ICD-10-CM

## 2017-12-01 DIAGNOSIS — Z885 Allergy status to narcotic agent status: Secondary | ICD-10-CM

## 2017-12-01 DIAGNOSIS — J439 Emphysema, unspecified: Secondary | ICD-10-CM | POA: Diagnosis present

## 2017-12-01 DIAGNOSIS — Z716 Tobacco abuse counseling: Secondary | ICD-10-CM | POA: Diagnosis not present

## 2017-12-01 DIAGNOSIS — W19XXXA Unspecified fall, initial encounter: Secondary | ICD-10-CM

## 2017-12-01 DIAGNOSIS — S72002A Fracture of unspecified part of neck of left femur, initial encounter for closed fracture: Secondary | ICD-10-CM

## 2017-12-01 LAB — CBC WITH DIFFERENTIAL/PLATELET
Abs Immature Granulocytes: 0.03 10*3/uL (ref 0.00–0.07)
BASOS ABS: 0 10*3/uL (ref 0.0–0.1)
BASOS PCT: 1 %
EOS ABS: 0.1 10*3/uL (ref 0.0–0.5)
Eosinophils Relative: 1 %
HEMATOCRIT: 45.3 % (ref 36.0–46.0)
Hemoglobin: 14 g/dL (ref 12.0–15.0)
IMMATURE GRANULOCYTES: 0 %
Lymphocytes Relative: 24 %
Lymphs Abs: 1.8 10*3/uL (ref 0.7–4.0)
MCH: 28.7 pg (ref 26.0–34.0)
MCHC: 30.9 g/dL (ref 30.0–36.0)
MCV: 92.8 fL (ref 80.0–100.0)
Monocytes Absolute: 0.6 10*3/uL (ref 0.1–1.0)
Monocytes Relative: 8 %
NEUTROS PCT: 66 %
NRBC: 0 % (ref 0.0–0.2)
Neutro Abs: 5.1 10*3/uL (ref 1.7–7.7)
PLATELETS: 252 10*3/uL (ref 150–400)
RBC: 4.88 MIL/uL (ref 3.87–5.11)
RDW: 17.1 % — AB (ref 11.5–15.5)
WBC: 7.7 10*3/uL (ref 4.0–10.5)

## 2017-12-01 LAB — COMPREHENSIVE METABOLIC PANEL
ALT: 16 U/L (ref 0–44)
ANION GAP: 9 (ref 5–15)
AST: 25 U/L (ref 15–41)
Albumin: 3.8 g/dL (ref 3.5–5.0)
Alkaline Phosphatase: 175 U/L — ABNORMAL HIGH (ref 38–126)
BILIRUBIN TOTAL: 0.5 mg/dL (ref 0.3–1.2)
BUN: 18 mg/dL (ref 8–23)
CHLORIDE: 102 mmol/L (ref 98–111)
CO2: 28 mmol/L (ref 22–32)
Calcium: 8.2 mg/dL — ABNORMAL LOW (ref 8.9–10.3)
Creatinine, Ser: 0.71 mg/dL (ref 0.44–1.00)
GFR calc Af Amer: >60 mL/min (ref >60)
Glucose, Bld: 95 mg/dL (ref 70–99)
POTASSIUM: 3.6 mmol/L (ref 3.5–5.1)
Sodium: 139 mmol/L (ref 135–145)
TOTAL PROTEIN: 7.7 g/dL (ref 6.5–8.1)

## 2017-12-01 LAB — TROPONIN I

## 2017-12-01 LAB — PHENYTOIN LEVEL, TOTAL: PHENYTOIN LVL: 14 ug/mL (ref 10.0–20.0)

## 2017-12-01 MED ORDER — SERTRALINE HCL 50 MG PO TABS
100.0000 mg | ORAL_TABLET | Freq: Every day | ORAL | Status: DC
  Administered 2017-12-03 – 2017-12-06 (×4): 100 mg via ORAL
  Filled 2017-12-01 (×4): qty 2

## 2017-12-01 MED ORDER — NITROGLYCERIN 0.4 MG SL SUBL: 0.4000 mg | SUBLINGUAL_TABLET | SUBLINGUAL | Status: DC | PRN

## 2017-12-01 MED ORDER — ALBUTEROL SULFATE HFA 108 (90 BASE) MCG/ACT IN AERS: 2.0000 | INHALATION_SPRAY | Freq: Four times a day (QID) | RESPIRATORY_TRACT | Status: DC | PRN

## 2017-12-01 MED ORDER — CEFAZOLIN SODIUM-DEXTROSE 2-4 GM/100ML-% IV SOLN
2.0000 g | INTRAVENOUS | Status: AC
  Administered 2017-12-02: 2 g via INTRAVENOUS
  Filled 2017-12-01: qty 100

## 2017-12-01 MED ORDER — PANTOPRAZOLE SODIUM 40 MG PO TBEC
40.0000 mg | DELAYED_RELEASE_TABLET | Freq: Every day | ORAL | Status: DC
  Administered 2017-12-03 – 2017-12-06 (×4): 40 mg via ORAL
  Filled 2017-12-01 (×4): qty 1

## 2017-12-01 MED ORDER — GABAPENTIN 400 MG PO CAPS
800.0000 mg | ORAL_CAPSULE | Freq: Three times a day (TID) | ORAL | Status: DC
  Administered 2017-12-01 – 2017-12-06 (×12): 800 mg via ORAL
  Filled 2017-12-01 (×13): qty 2

## 2017-12-01 MED ORDER — ONDANSETRON HCL 4 MG PO TABS: 4.0000 mg | ORAL_TABLET | Freq: Four times a day (QID) | ORAL | Status: DC | PRN

## 2017-12-01 MED ORDER — QUETIAPINE FUMARATE 25 MG PO TABS
50.0000 mg | ORAL_TABLET | Freq: Every day | ORAL | Status: DC
  Administered 2017-12-01 – 2017-12-05 (×5): 50 mg via ORAL
  Filled 2017-12-01 (×5): qty 2

## 2017-12-01 MED ORDER — ONDANSETRON HCL 4 MG/2ML IJ SOLN
4.0000 mg | Freq: Once | INTRAMUSCULAR | Status: AC
  Administered 2017-12-01: 4 mg via INTRAVENOUS
  Filled 2017-12-01: qty 2

## 2017-12-01 MED ORDER — HYDROMORPHONE HCL 1 MG/ML IJ SOLN
0.5000 mg | Freq: Once | INTRAMUSCULAR | Status: AC
  Administered 2017-12-01: 0.5 mg via INTRAVENOUS
  Filled 2017-12-01: qty 1

## 2017-12-01 MED ORDER — ONDANSETRON HCL 4 MG/2ML IJ SOLN: 4.0000 mg | Freq: Four times a day (QID) | INTRAMUSCULAR | Status: DC | PRN

## 2017-12-01 MED ORDER — MORPHINE SULFATE (PF) 2 MG/ML IV SOLN
2.0000 mg | INTRAVENOUS | Status: DC | PRN
  Administered 2017-12-02 (×2): 2 mg via INTRAVENOUS
  Filled 2017-12-01 (×2): qty 1

## 2017-12-01 MED ORDER — PHENYTOIN SODIUM EXTENDED 100 MG PO CAPS
200.0000 mg | ORAL_CAPSULE | Freq: Every day | ORAL | Status: DC
  Administered 2017-12-01 – 2017-12-05 (×5): 200 mg via ORAL
  Filled 2017-12-01 (×6): qty 2

## 2017-12-01 MED ORDER — MOMETASONE FURO-FORMOTEROL FUM 100-5 MCG/ACT IN AERO
2.0000 | INHALATION_SPRAY | Freq: Two times a day (BID) | RESPIRATORY_TRACT | Status: DC
  Administered 2017-12-01 – 2017-12-06 (×10): 2 via RESPIRATORY_TRACT
  Filled 2017-12-01: qty 8.8

## 2017-12-01 MED ORDER — BUSPIRONE HCL 10 MG PO TABS
10.0000 mg | ORAL_TABLET | Freq: Two times a day (BID) | ORAL | Status: DC
  Administered 2017-12-01 – 2017-12-06 (×9): 10 mg via ORAL
  Filled 2017-12-01 (×9): qty 1

## 2017-12-01 MED ORDER — ATORVASTATIN CALCIUM 10 MG PO TABS
10.0000 mg | ORAL_TABLET | Freq: Every day | ORAL | Status: DC
  Administered 2017-12-02 – 2017-12-05 (×4): 10 mg via ORAL
  Filled 2017-12-01 (×4): qty 1

## 2017-12-01 MED ORDER — SENNOSIDES-DOCUSATE SODIUM 8.6-50 MG PO TABS: 1.0000 | ORAL_TABLET | Freq: Every evening | ORAL | Status: DC | PRN

## 2017-12-01 MED ORDER — HYDROCODONE-ACETAMINOPHEN 5-325 MG PO TABS
1.0000 | ORAL_TABLET | ORAL | Status: DC | PRN
  Administered 2017-12-01: 1 via ORAL
  Administered 2017-12-02: 2 via ORAL
  Administered 2017-12-04 – 2017-12-06 (×4): 1 via ORAL
  Filled 2017-12-01 (×4): qty 1
  Filled 2017-12-01: qty 2
  Filled 2017-12-01: qty 1

## 2017-12-01 MED ORDER — ALBUTEROL SULFATE (2.5 MG/3ML) 0.083% IN NEBU
2.5000 mg | INHALATION_SOLUTION | Freq: Four times a day (QID) | RESPIRATORY_TRACT | Status: DC | PRN
  Administered 2017-12-02: 2.5 mg via RESPIRATORY_TRACT
  Filled 2017-12-01: qty 3

## 2017-12-01 MED ORDER — ACETAMINOPHEN 650 MG RE SUPP: 650.0000 mg | Freq: Four times a day (QID) | RECTAL | Status: DC | PRN

## 2017-12-01 MED ORDER — NICOTINE 21 MG/24HR TD PT24
21.0000 mg | MEDICATED_PATCH | Freq: Every day | TRANSDERMAL | Status: DC
  Administered 2017-12-01 – 2017-12-05 (×5): 21 mg via TRANSDERMAL
  Filled 2017-12-01 (×5): qty 1

## 2017-12-01 MED ORDER — ACETAMINOPHEN 325 MG PO TABS
650.0000 mg | ORAL_TABLET | Freq: Four times a day (QID) | ORAL | Status: DC | PRN
  Administered 2017-12-02 – 2017-12-06 (×6): 650 mg via ORAL
  Filled 2017-12-01 (×6): qty 2

## 2017-12-01 NOTE — ED Notes (Signed)
MD Mayford Knife notified pt 89% on room air Pt placed on 2L nasal cannula

## 2017-12-01 NOTE — ED Provider Notes (Signed)
[facility] Emergency Department Provider Note       Time seen: ----------------------------------------- 4:21 PM on 12/01/2017 -----------------------------------------   I have reviewed the triage vital signs and the nursing notes.  HISTORY   Chief Complaint Fall    HPI Sandy Daniel is a 84 y.o. female with a history of asthma, coronary disease, COPD, depression, hyperlipidemia, hypertension who presents to the ED for left hip pain after a fall.  [facility]  Had a witnessed mechanical fall and fell on her left hip.  She is been unable to ambulate since the fall.  Denies any loss of consciousness or head injury.  Past Medical History:  Diagnosis Date  . Arthritis   . Asthma   . B12 deficiency   . CAD (coronary artery disease)   . COPD (chronic obstructive pulmonary disease) (HCC)   . Depression   . Epilepsy (HCC)   . GERD (gastroesophageal reflux disease)   . Hyperlipidemia   . Hypertension   . Hypoxemia   . Macular degeneration (senile) of retina   . Macular degeneration of both eyes   . MI (myocardial infarction) (HCC) 09/2003, 05/2013  . Physiological tremor   . Presence of stent of CABG    x2   . Seizures [facility])     Patient Active Problem List   Diagnosis Date Noted  . COPD with acute exacerbation (HCC) 10/21/2017  . Cervical spondylosis without myelopathy 09/17/2017  . Disability of walking 09/17/2017  . Chronic pain syndrome 09/17/2017  . DDD (degenerative disc disease), cervical 09/17/2017  . Chest pain 09/07/2017  . 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 Major depressive disorder with single episode, in partial remission (HCC) 05/15/2017  . Neck pain 01/31/2017  . Chronic right shoulder pain 01/31/2017  . Chronic bilateral thoracic back pain 12/04/2016  . Muscle spasm of back 10/22/2016  . (HFpEF) heart failure with preserved ejection fraction (HCC) 09/11/2016  . Anxiety  07/16/2016  . Chronic bilateral low back pain 07/16/2016  . Unstable gait 07/16/2016  . Controlled substance agreement signed 03/27/2016  . Dysphagia 03/26/2016  . Panic disorder 03/26/2016  . CAD (coronary artery disease), native coronary artery   . Seizure disorder (HCC)   . Macular degeneration of both eyes   . B12 deficiency   . COPD (chronic obstructive pulmonary disease) (HCC)   . HTN (hypertension)   . Hyperlipidemia   . Arthritis   . Depression   . GERD (gastroesophageal reflux disease)   . Physiological tremor 02/18/2014    Past Surgical History:  Procedure Laterality Date  . ABDOMINAL HYSTERECTOMY    . APPENDECTOMY    . CARDIAC CATHETERIZATION  05/2013  . CARDIAC SURGERY    . CATARACT EXTRACTION  03/2016  . CHOLECYSTECTOMY  1962  . CORONARY ANGIOPLASTY WITH STENT PLACEMENT  Aug. 2005 and March 2015  . ROTATOR CUFF REPAIR Right   . VENTRAL HERNIA REPAIR  2002    Allergies Motrin [ibuprofen]; Tolmetin; Alendronate; Other; and Tramadol hcl  Social History Social History   Tobacco Use  . Smoking status: Current Every Day Smoker    Packs/day: 1.00    Years: 50.00    Pack years: 50.00    Types: Cigarettes  . Smokeless tobacco: Never Used  . Tobacco comment: 10 a day   Substance Use Topics  . Alcohol use: No  . Drug use: No   Review of Systems Constitutional: Negative for fever. Cardiovascular: Negative for  chest pain. Respiratory: Negative for shortness of breath. Gastrointestinal: Negative for abdominal pain, vomiting and diarrhea. Musculoskeletal: Positive for left hip pain Skin: Negative for rash. Neurological: Negative for headaches, focal weakness or numbness.  All systems negative/normal/unremarkable except as stated in the HPI  ____________________________________________   PHYSICAL EXAM:  VITAL SIGNS: ED Triage Vitals  Enc Vitals Group     BP 12/01/17 1616 (!) 161/63     Pulse Rate 12/01/17 1616 68     Resp 12/01/17 1616 17     Temp  12/01/17 1616 98.4 F (36.9 C)     Temp Source 12/01/17 1616 Oral     SpO2 12/01/17 1616 95 %     Weight --      Height --      Head Circumference --      Peak Flow --      Pain Score 12/01/17 1612 10     Pain Loc --      Pain Edu? --      Excl. in GC? --    Constitutional: Alert and oriented.  Mild distress due to pain Eyes: Conjunctivae are normal. Normal extraocular movements. ENT   Head: Normocephalic and atraumatic.   Nose: No congestion/rhinnorhea.   Mouth/Throat: Mucous membranes are moist.   Neck: No stridor. Cardiovascular: Normal rate, regular rhythm. No murmurs, rubs, or gallops. Respiratory: Normal respiratory effort without tachypnea nor retractions. Breath sounds are clear and equal bilaterally. No wheezes/rales/rhonchi. Gastrointestinal: Soft and nontender. Normal bowel sounds Musculoskeletal: Pain with range of motion of the left hip, left lower extremity shortened compared to right Neurologic:  Normal speech and language. No gross focal neurologic deficits are appreciated.  Skin:  Skin is warm, dry and intact. No rash noted. Psychiatric: Mood and affect are normal. Speech and behavior are normal.  ____________________________________________  ED COURSE:  As part of my medical decision making, I reviewed the following data within the electronic MEDICAL RECORD NUMBER History obtained from family if available, nursing notes, old chart and ekg, as well as notes from prior ED visits. Patient presented for mechanical fall with left hip pain, we will assess with labs and imaging as indicated at this time.   Procedures ____________________________________________   LABS (pertinent positives/negatives)  Labs Reviewed  CBC WITH DIFFERENTIAL/PLATELET - Abnormal; Notable for the following components:      Result Value   RDW 17.1 (*)    All other components within normal limits  COMPREHENSIVE METABOLIC PANEL  TROPONIN I  URINALYSIS, COMPLETE (UACMP) WITH  MICROSCOPIC  PHENYTOIN LEVEL, TOTAL    RADIOLOGY Images were viewed by me  Left hip x-ray, chest x-ray IMPRESSION: No acute disease.  Emphysema.  Cardiomegaly.  Atherosclerosis. IMPRESSION: Comminuted impacted intertrochanteric fracture of the left femur with superior displacement of the distal fracture fragment. ____________________________________________  DIFFERENTIAL DIAGNOSIS   Contusion, fracture, dislocation  FINAL ASSESSMENT AND PLAN  Intertrochanteric left hip fracture   Plan: The patient had presented for a fall. Patient's labs are grossly unremarkable at this time. Patient's imaging did reveal a intertrochanteric left hip fracture.  I discussed with Dr. Odis Luster from orthopedics who will plan for ORIF.  I will discuss with the hospitalist for admission.   Ulice Dash, MD   Note: This note was generated in part or whole with voice recognition software. Voice recognition is usually quite accurate but there are transcription errors that can and very often do occur. I apologize for any typographical errors that were not detected and corrected.  Emily Filbert, MD 12/01/17 [id number]
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
## 2017-12-01 NOTE — Progress Notes (Signed)
Advanced care plan.  Purpose of the Encounter: CODE STATUS  Parties in Attendance: Patient  Patient's Decision Capacity: Good  Subjective/Patient's story: Presented to emergency room for fall   Objective/Medical story [facility] She fell down and imaging studies showed left hip fracture Needs orthopedic surgery evaluation  Goals of care determination:  Advance care directives and goals of care discussed Patient does not want any CPR, intubation or ventilator if the need arises   CODE STATUS: DNR   Time spent discussing advanced care planning: 16 minutes

## 2017-12-01 NOTE — ED Triage Notes (Addendum)
Pt to ED via EMS from [facility]. PT had witnessed mechanical fall , c/o LFT hip pain. of fent given IM in route.  Denies any LOC or head injury. A&OX4, VSS

## 2017-12-01 NOTE — H&P (Signed)
[facility] - [place] at [facility]   PATIENT NAME: Sandy Daniel    MR#:  [id number]  DATE OF BIRTH:  01/25/1929  DATE OF ADMISSION:  12/01/2017  PRIMARY CARE PHYSICIAN: Galen Manila, NP   REQUESTING/REFERRING PHYSICIAN:   CHIEF COMPLAINT:   Chief Complaint  Patient presents with  . Fall    HISTORY OF PRESENT ILLNESS: Deaja Rizo  is a 84 y.o. female with a known history of coronary artery disease, COPD on oxygen at bedtime, seizure disorder, hyperlipidemia, hypertension, GERD, myocardial infarction presented to the emergency room for fall.  [facility]  She fell down and landed on her left hip and has tenderness and pain in the left hip.  Pain is aching in nature 6 out of 10 on a scale of 1-10.  Imaging studies in the emergency room showed intertrochanteric fracture of left femur.  Hospitalist service was consulted for further care.  PAST MEDICAL HISTORY:   Past Medical History:  Diagnosis Date  . Arthritis   . Asthma   . B12 deficiency   . CAD (coronary artery disease)   . COPD (chronic obstructive pulmonary disease) (HCC)   . Depression   . Epilepsy (HCC)   . GERD (gastroesophageal reflux disease)   . Hyperlipidemia   . Hypertension   . Hypoxemia   . Macular degeneration (senile) of retina   . Macular degeneration of both eyes   . MI (myocardial infarction) (HCC) 09/2003, 05/2013  . Physiological tremor   . Presence of stent of CABG    x2   . Seizures (HCC)     PAST SURGICAL HISTORY:  Past Surgical History:  Procedure Laterality Date  . ABDOMINAL HYSTERECTOMY    . APPENDECTOMY    . CARDIAC CATHETERIZATION  05/2013  . CARDIAC SURGERY    . CATARACT EXTRACTION  03/2016  . CHOLECYSTECTOMY  1962  . CORONARY ANGIOPLASTY WITH STENT PLACEMENT  Aug. 2005 and March 2015  . ROTATOR CUFF REPAIR Right   . VENTRAL HERNIA REPAIR  2002    SOCIAL HISTORY:  Social History   Tobacco Use  .  Smoking status: Current Every Day Smoker    Packs/day: 1.00    Years: 50.00    Pack years: 50.00    Types: Cigarettes  . Smokeless tobacco: Never Used  . Tobacco comment: 10 a day   Substance Use Topics  . Alcohol use: No    FAMILY HISTORY:  Family History  Problem Relation Age of Onset  . Diabetes Mother   . Cancer Sister   . Diabetes Sister   . Heart disease Sister   . Heart attack Sister   . Hyperlipidemia Sister   . Hypertension Sister   . Cancer Brother   . Diabetes Brother   . Heart disease Brother   . Heart attack Brother   . Hyperlipidemia Brother   . Hypertension Brother   . Diabetes Brother   . Diabetes Brother   . Kidney failure Brother     DRUG ALLERGIES:  Allergies  Allergen Reactions  . Motrin [Ibuprofen] Shortness Of Breath and Swelling    Swelling of lip/tongue  . Tolmetin Rash  . Alendronate     Other reaction(s): Nausea And Vomiting Other reaction(s): Nausea And Vomiting  . Other Rash    Any Steriods. Causes mouth sores  . Tramadol Hcl Nausea And Vomiting    REVIEW OF SYSTEMS:   CONSTITUTIONAL: No fever, fatigue or weakness.  EYES: No blurred or double vision.  EARS, NOSE, AND THROAT: No tinnitus or ear pain.  RESPIRATORY: No cough, shortness of breath, wheezing or hemoptysis.  CARDIOVASCULAR: No chest pain, orthopnea, edema.  GASTROINTESTINAL: No nausea, vomiting, diarrhea or abdominal pain.  GENITOURINARY: No dysuria, hematuria.  ENDOCRINE: No polyuria, nocturia,  HEMATOLOGY: No anemia, easy bruising or bleeding SKIN: No rash or lesion. MUSCULOSKELETAL: Left hip pain NEUROLOGIC: No tingling, numbness, weakness.  PSYCHIATRY: No anxiety or depression.   MEDICATIONS AT HOME:  Prior to Admission medications   Medication Sig Start Date End Date Taking? Authorizing Provider  albuterol (PROVENTIL HFA;VENTOLIN HFA) 108 (90 Base) MCG/ACT inhaler Inhale 2 puffs into the lungs every 6 (six) hours as needed for wheezing or shortness of breath.  08/13/17  Yes Galen Manila, NP  aspirin 81 MG tablet Take 1 tablet (81 mg total) by mouth daily. 11/08/17  Yes Galen Manila, NP  atorvastatin (LIPITOR) 10 MG tablet Take 1 tablet (10 mg total) by mouth daily. 08/13/17  Yes Galen Manila, NP  busPIRone (BUSPAR) 10 MG tablet Take 1 tablet (10 mg total) by mouth 2 (two) times daily. 08/13/17  Yes Galen Manila, NP  Fluticasone-Salmeterol (ADVAIR) 100-50 MCG/DOSE AEPB Inhale 1 puff into the lungs 2 (two) times daily. 08/13/17  Yes Galen Manila, NP  albuterol (PROVENTIL) (2.5 MG/3ML) 0.083% nebulizer solution Take 3 mLs (2.5 mg total) by nebulization every 6 (six) hours as needed for Wheezing. 08/13/17   Galen Manila, NP  furosemide (LASIX) 20 MG tablet Take 0.5 tablets (10 mg total) by mouth daily. 08/13/17   Galen Manila, NP  gabapentin (NEURONTIN) 400 MG capsule Take 2 capsules (800 mg total) by mouth 3 (three) times daily. 08/13/17   Galen Manila, NP  HYDROcodone-acetaminophen (NORCO) 7.5-325 MG tablet Take 1 tablet by mouth 2 (two) times daily as needed for moderate pain. For chronic pain. To fill on or after 09/18/2017, 10/18/2017, 11/16/2017 09/17/17   Edward Jolly, MD  nitroGLYCERIN (NITROSTAT) 0.4 MG SL tablet Place 1 tablet (0.4 mg total) under the tongue every 5 (five) minutes as needed for chest pain. 06/12/17   Galen Manila, NP  pantoprazole (PROTONIX) 40 MG tablet Take 1 tablet (40 mg total) by mouth daily. 08/13/17   Galen Manila, NP  phenytoin (DILANTIN) 100 MG ER capsule TAKE 2 CAPSULES (200MG ) BY MOUTH AT BEDTIME 09/20/17   Galen Manila, NP  QUEtiapine (SEROQUEL) 50 MG tablet Take 1 tablet (50 mg total) by mouth at bedtime. 08/13/17   Galen Manila, NP  sertraline (ZOLOFT) 100 MG tablet Take 1 tablet (100 mg total) by mouth daily. 08/13/17   Galen Manila, NP      PHYSICAL EXAMINATION:   VITAL SIGNS: Blood pressure (!) 161/63, pulse 68, temperature 98.4  F (36.9 C), temperature source Oral, resp. rate 17, SpO2 95 %.  GENERAL:  84 y.o.-year-old patient lying in the bed with no acute distress.  EYES: Pupils equal, round, reactive to light and accommodation. No scleral icterus. Extraocular muscles intact.  HEENT: Head atraumatic, normocephalic. Oropharynx and nasopharynx clear.  NECK:  Supple, no jugular venous distention. No thyroid enlargement, no tenderness.  LUNGS: Normal breath sounds bilaterally, no wheezing, rales,rhonchi or crepitation. No use of accessory muscles of respiration.  CARDIOVASCULAR: S1, S2 normal. No murmurs, rubs, or gallops.  ABDOMEN: Soft, nontender, nondistended. Bowel sounds present. No organomegaly or mass.  EXTREMITIES: No pedal edema, cyanosis, or clubbing.  Tenderness in the  left hip NEUROLOGIC: Cranial nerves II through XII are intact. Muscle strength 5/5 in all extremities. Sensation intact. Gait not checked.  PSYCHIATRIC: The patient is alert and oriented x 3.  SKIN: No obvious rash, lesion, or ulcer.   LABORATORY PANEL:   CBC Recent Labs  Lab 12/01/17 1701  WBC 7.7  HGB 14.0  HCT 45.3  PLT 252  MCV 92.8  MCH 28.7  MCHC 30.9  RDW 17.1*  LYMPHSABS 1.8  MONOABS 0.6  EOSABS 0.1  BASOSABS 0.0   ------------------------------------------------------------------------------------------------------------------  Chemistries  Recent Labs  Lab 12/01/17 1701  NA 139  K 3.6  CL 102  CO2 28  GLUCOSE 95  BUN 18  CREATININE 0.71  CALCIUM 8.2*  AST 25  ALT 16  ALKPHOS 175*  BILITOT 0.5   ------------------------------------------------------------------------------------------------------------------ estimated creatinine clearance is 41.5 mL/min (by C-G formula based on SCr of 0.71 mg/dL). ------------------------------------------------------------------------------------------------------------------ No results for input(s): TSH, T4TOTAL, T3FREE, THYROIDAB in the last 72 hours.  Invalid  input(s): FREET3   Coagulation profile No results for input(s): INR, PROTIME in the last 168 hours. ------------------------------------------------------------------------------------------------------------------- No results for input(s): DDIMER in the last 72 hours. -------------------------------------------------------------------------------------------------------------------  Cardiac Enzymes Recent Labs  Lab 12/01/17 1701  TROPONINI <0.03   ------------------------------------------------------------------------------------------------------------------ Invalid input(s): POCBNP  ---------------------------------------------------------------------------------------------------------------  Urinalysis    Component Value Date/Time   COLORURINE YELLOW (A) 06/03/2017 2106   APPEARANCEUR CLEAR (A) 06/03/2017 2106   LABSPEC 1.014 06/03/2017 2106   PHURINE 6.0 06/03/2017 2106   GLUCOSEU NEGATIVE 06/03/2017 2106   HGBUR NEGATIVE 06/03/2017 2106   BILIRUBINUR NEGATIVE 06/03/2017 2106   KETONESUR NEGATIVE 06/03/2017 2106   PROTEINUR NEGATIVE 06/03/2017 2106   NITRITE NEGATIVE 06/03/2017 2106   LEUKOCYTESUR SMALL (A) 06/03/2017 2106     RADIOLOGY: Dg Chest 1 View  Result Date: 12/01/2017 CLINICAL DATA:  Left hip pain due to a fall today. EXAM: CHEST  1 VIEW COMPARISON:  PA and lateral chest 10/25/2017 and 07/11/2016. CT chest 07/11/2016. FINDINGS: The lungs are emphysematous. Small area of linear scar in the left upper lobe is unchanged. There is cardiomegaly. Atherosclerosis is noted. No pneumothorax or pleural effusion. No acute bony abnormality. Thoracolumbar scoliosis is again seen. IMPRESSION: No acute disease. Emphysema. Cardiomegaly. Atherosclerosis. Electronically Signed   By: Drusilla Kanner M.D.   On: 12/01/2017 17:22   Dg Hip Unilat W Or W/o Pelvis 2-3 Views Left  Result Date: 12/01/2017 CLINICAL DATA:  Left hip pain post fall. EXAM: DG HIP (WITH OR WITHOUT  PELVIS) 2-3V LEFT COMPARISON:  12/15/2016 FINDINGS: There is a comminuted impacted intertrochanteric fracture of the left femur with superior displacement of the distal fracture fragment. The left femoral head is normally located. Osteopenia. Vascular calcifications and postsurgical changes in the abdomen noted. IMPRESSION: Comminuted impacted intertrochanteric fracture of the left femur with superior displacement of the distal fracture fragment. Electronically Signed   By: Ted Mcalpine M.D.   On: 12/01/2017 17:21    EKG: Orders placed or performed in visit on 11/18/17  . EKG 12-Lead    IMPRESSION AND PLAN: 84 year old female patient with a known history of coronary artery disease, COPD on oxygen at bedtime, seizure disorder, hyperlipidemia, hypertension, GERD, myocardial infarction presented to the emergency room for fall.    -Left intertrochanteric femur fracture Admit patient to medical floor N.p.o. After midnight Orthopedic surgery evaluation Hold blood thinner medication  -Mechanical fall  -COPD Continue oxygen via nasal cannula  -Seizure disorder Resume Dilantin  -Left hip pain Oral Norco, along with IV morphine  as needed for pain management  -Tobacco abuse Tobacco cessation counseled to the patient for 6 minutes Nicotine patch offered  -DVT prophylaxis sequential compression device to lower extremities  All the records are reviewed and case discussed with ED provider. Management plans discussed with the patient, family and they are in agreement.  CODE STATUS: DNR Code Status History    Date Active Date Inactive Code Status Order ID Comments User Context   10/22/2017 0954 10/27/2017 2328 DNR [id number]  Ramonita Lab, MD Inpatient   10/22/2017 0107 10/22/2017 0953 Full Code [id number]  Oralia Manis, MD Inpatient   09/07/2017 1354 09/08/2017 1933 Full Code [id number]  Shaune Pollack, MD Inpatient   07/11/2016 1416 07/13/2016 1824 Full Code [id number]  Milagros Loll, MD ED     Questions for Most Recent Historical Code Status (Order [id number])    Question Answer Comment   In the event of cardiac or respiratory ARREST Do not call a "code blue"    In the event of cardiac or respiratory ARREST Do not perform Intubation, CPR, defibrillation or ACLS    In the event of cardiac or respiratory ARREST Use medication by any route, position, wound care, and other measures to relive pain and suffering. May use oxygen, suction and manual treatment of airway obstruction as needed for comfort.    Comments RN may pronounce        TOTAL TIME TAKING CARE OF THIS PATIENT: 52 minutes.    Ihor Austin M.D on 12/01/2017 at 5:52 PM  Between 7am to 6pm - Pager - [phone]  After 6pm go to [email or web address] - password [email or web address] [facility]  [facility]  Office  [phone]  CC: Primary care physician; Galen Manila, NP

## 2017-12-02 ENCOUNTER — Inpatient Hospital Stay: Payer: Medicare HMO | Admitting: Anesthesiology

## 2017-12-02 ENCOUNTER — Inpatient Hospital Stay: Payer: Medicare HMO

## 2017-12-02 ENCOUNTER — Encounter: Payer: Self-pay | Admitting: *Deleted

## 2017-12-02 ENCOUNTER — Encounter
Admission: EM | Disposition: A | Discharge status: Skilled Nursing Facility | Payer: Self-pay | Source: Home / Self Care | Attending: Internal Medicine

## 2017-12-02 HISTORY — PX: INTRAMEDULLARY (IM) NAIL INTERTROCHANTERIC: SHX5875

## 2017-12-02 LAB — CBC
HCT: 42.5 % (ref 36.0–46.0)
Hemoglobin: 12.7 g/dL (ref 12.0–15.0)
MCH: 28.5 pg (ref 26.0–34.0)
MCHC: 29.9 g/dL — ABNORMAL LOW (ref 30.0–36.0)
MCV: 95.3 fL (ref 80.0–100.0)
PLATELETS: 228 10*3/uL (ref 150–400)
RBC: 4.46 MIL/uL (ref 3.87–5.11)
RDW: 17 % — AB (ref 11.5–15.5)
WBC: 5.3 10*3/uL (ref 4.0–10.5)
nRBC: 0 % (ref 0.0–0.2)

## 2017-12-02 LAB — PROTIME-INR
INR: 0.96
PROTHROMBIN TIME: 12.7 s (ref 11.4–15.2)

## 2017-12-02 LAB — BASIC METABOLIC PANEL
ANION GAP: 6 (ref 5–15)
BUN: 18 mg/dL (ref 8–23)
CALCIUM: 8.2 mg/dL — AB (ref 8.9–10.3)
CO2: 30 mmol/L (ref 22–32)
CREATININE: 0.67 mg/dL (ref 0.44–1.00)
Chloride: 106 mmol/L (ref 98–111)
GFR calc Af Amer: >60 mL/min (ref >60)
Glucose, Bld: 83 mg/dL (ref 70–99)
Potassium: 3.8 mmol/L (ref 3.5–5.1)
Sodium: 142 mmol/L (ref 135–145)

## 2017-12-02 LAB — MRSA PCR SCREENING: MRSA by PCR: NEGATIVE

## 2017-12-02 SURGERY — FIXATION, FRACTURE, INTERTROCHANTERIC, WITH INTRAMEDULLARY ROD
Anesthesia: Spinal | Site: Hip | Laterality: Left

## 2017-12-02 MED ORDER — MIDAZOLAM HCL 2 MG/2ML IJ SOLN
INTRAMUSCULAR | Status: AC
  Filled 2017-12-02: qty 2

## 2017-12-02 MED ORDER — PROPOFOL 10 MG/ML IV BOLUS
INTRAVENOUS | Status: DC | PRN
  Administered 2017-12-02 (×3): 20 mg via INTRAVENOUS

## 2017-12-02 MED ORDER — PROMETHAZINE HCL 25 MG/ML IJ SOLN: 6.2500 mg | INTRAMUSCULAR | Status: DC | PRN

## 2017-12-02 MED ORDER — BUPIVACAINE HCL (PF) 0.5 % IJ SOLN
INTRAMUSCULAR | Status: DC | PRN
  Administered 2017-12-02: 3 mL

## 2017-12-02 MED ORDER — PROPOFOL 500 MG/50ML IV EMUL: INTRAVENOUS | Status: DC | PRN

## 2017-12-02 MED ORDER — PROPOFOL 500 MG/50ML IV EMUL
INTRAVENOUS | Status: DC | PRN
  Administered 2017-12-02: 25 ug/kg/min via INTRAVENOUS

## 2017-12-02 MED ORDER — KETAMINE HCL 50 MG/ML IJ SOLN
INTRAMUSCULAR | Status: AC
  Filled 2017-12-02: qty 10

## 2017-12-02 MED ORDER — SODIUM CHLORIDE 0.9 % IV SOLN
INTRAVENOUS | Status: DC | PRN
  Administered 2017-12-02: 25 ug/min via INTRAVENOUS

## 2017-12-02 MED ORDER — FENTANYL CITRATE (PF) 100 MCG/2ML IJ SOLN
INTRAMUSCULAR | Status: AC
  Filled 2017-12-02: qty 2

## 2017-12-02 MED ORDER — ACETAMINOPHEN 10 MG/ML IV SOLN
INTRAVENOUS | Status: DC | PRN
  Administered 2017-12-02: 1000 mg via INTRAVENOUS

## 2017-12-02 MED ORDER — LACTATED RINGERS IV SOLN
INTRAVENOUS | Status: DC
  Administered 2017-12-02 (×3): via INTRAVENOUS

## 2017-12-02 MED ORDER — DOCUSATE SODIUM 100 MG PO CAPS
100.0000 mg | ORAL_CAPSULE | Freq: Two times a day (BID) | ORAL | Status: DC
  Administered 2017-12-02 – 2017-12-06 (×6): 100 mg via ORAL
  Filled 2017-12-02 (×8): qty 1

## 2017-12-02 MED ORDER — ONDANSETRON HCL 4 MG/2ML IJ SOLN: 4.0000 mg | Freq: Four times a day (QID) | INTRAMUSCULAR | Status: DC | PRN

## 2017-12-02 MED ORDER — BUPIVACAINE HCL (PF) 0.5 % IJ SOLN
INTRAMUSCULAR | Status: AC
  Filled 2017-12-02: qty 10

## 2017-12-02 MED ORDER — BACITRACIN 50000 UNITS IM SOLR
INTRAMUSCULAR | Status: AC
  Filled 2017-12-02: qty 1

## 2017-12-02 MED ORDER — HYDROMORPHONE HCL 1 MG/ML IJ SOLN
1.0000 mg | INTRAMUSCULAR | Status: AC
  Administered 2017-12-02: 1 mg via INTRAVENOUS
  Filled 2017-12-02: qty 1

## 2017-12-02 MED ORDER — METOCLOPRAMIDE HCL 5 MG/ML IJ SOLN: 5.0000 mg | Freq: Three times a day (TID) | INTRAMUSCULAR | Status: DC | PRN

## 2017-12-02 MED ORDER — CEFAZOLIN SODIUM-DEXTROSE 1-4 GM/50ML-% IV SOLN
1.0000 g | Freq: Four times a day (QID) | INTRAVENOUS | Status: AC
  Administered 2017-12-02 – 2017-12-03 (×3): 1 g via INTRAVENOUS
  Filled 2017-12-02 (×3): qty 50

## 2017-12-02 MED ORDER — FENTANYL CITRATE (PF) 100 MCG/2ML IJ SOLN
INTRAMUSCULAR | Status: DC | PRN
  Administered 2017-12-02: 25 ug via INTRAVENOUS

## 2017-12-02 MED ORDER — CEFAZOLIN SODIUM-DEXTROSE 2-4 GM/100ML-% IV SOLN
INTRAVENOUS | Status: AC
  Filled 2017-12-02: qty 100

## 2017-12-02 MED ORDER — ONDANSETRON HCL 4 MG PO TABS: 4.0000 mg | ORAL_TABLET | Freq: Four times a day (QID) | ORAL | Status: DC | PRN

## 2017-12-02 MED ORDER — EPHEDRINE SULFATE 50 MG/ML IJ SOLN
INTRAMUSCULAR | Status: AC
  Filled 2017-12-02: qty 1

## 2017-12-02 MED ORDER — FENTANYL CITRATE (PF) 100 MCG/2ML IJ SOLN: 25.0000 ug | INTRAMUSCULAR | Status: DC | PRN

## 2017-12-02 MED ORDER — ACETAMINOPHEN 10 MG/ML IV SOLN
INTRAVENOUS | Status: AC
  Filled 2017-12-02: qty 100

## 2017-12-02 MED ORDER — SODIUM CHLORIDE 0.9 % IR SOLN
Status: DC | PRN
  Administered 2017-12-02: 180 mL

## 2017-12-02 MED ORDER — SODIUM CHLORIDE FLUSH 0.9 % IV SOLN
INTRAVENOUS | Status: AC
  Filled 2017-12-02: qty 10

## 2017-12-02 MED ORDER — PROPOFOL 500 MG/50ML IV EMUL
INTRAVENOUS | Status: AC
  Filled 2017-12-02: qty 50

## 2017-12-02 MED ORDER — METOCLOPRAMIDE HCL 10 MG PO TABS: 5.0000 mg | ORAL_TABLET | Freq: Three times a day (TID) | ORAL | Status: DC | PRN

## 2017-12-02 SURGICAL SUPPLY — 37 items
BLADE TFNA HELICAL 105 STRL (Anchor) ×3 IMPLANT
BNDG COHESIVE 4X5 TAN STRL (GAUZE/BANDAGES/DRESSINGS) ×6 IMPLANT
BRUSH SCRUB EZ  4% CHG (MISCELLANEOUS) ×4
BRUSH SCRUB EZ 4% CHG (MISCELLANEOUS) ×2 IMPLANT
CANISTER SUCT 1200ML W/VALVE (MISCELLANEOUS) ×3 IMPLANT
CHLORAPREP W/TINT 26ML (MISCELLANEOUS) ×3 IMPLANT
COVER WAND RF STERILE (DRAPES) ×3 IMPLANT
DRAPE SHEET LG 3/4 BI-LAMINATE (DRAPES) ×3 IMPLANT
DRAPE U-SHAPE 47X51 STRL (DRAPES) ×3 IMPLANT
DRSG OPSITE POSTOP 3X4 (GAUZE/BANDAGES/DRESSINGS) ×3 IMPLANT
DRSG OPSITE POSTOP 4X6 (GAUZE/BANDAGES/DRESSINGS) ×3 IMPLANT
DRSG OPSITE POSTOP 4X8 (GAUZE/BANDAGES/DRESSINGS) IMPLANT
ELECT REM PT RETURN 9FT ADLT (ELECTROSURGICAL) ×3
ELECTRODE REM PT RTRN 9FT ADLT (ELECTROSURGICAL) ×1 IMPLANT
GAUZE PETRO XEROFOAM 1X8 (MISCELLANEOUS) ×3 IMPLANT
GLOVE BIOGEL PI IND STRL 7.5 (GLOVE) ×2 IMPLANT
GLOVE BIOGEL PI INDICATOR 7.5 (GLOVE) ×4
GLOVE INDICATOR 8.0 STRL GRN (GLOVE) ×3 IMPLANT
GLOVE SURG ORTHO 8.0 STRL STRW (GLOVE) ×3 IMPLANT
GOWN STRL REUS W/ TWL LRG LVL3 (GOWN DISPOSABLE) ×1 IMPLANT
GOWN STRL REUS W/ TWL XL LVL3 (GOWN DISPOSABLE) ×1 IMPLANT
GOWN STRL REUS W/TWL LRG LVL3 (GOWN DISPOSABLE) ×2
GOWN STRL REUS W/TWL XL LVL3 (GOWN DISPOSABLE) ×2
GUIDEWIRE 3.2X400 (WIRE) ×6 IMPLANT
KIT PATIENT CARE HANA TABLE (KITS) ×3 IMPLANT
KIT TURNOVER CYSTO (KITS) ×3 IMPLANT
MAT ABSORB  FLUID 56X50 GRAY (MISCELLANEOUS) ×2
MAT ABSORB FLUID 56X50 GRAY (MISCELLANEOUS) ×1 IMPLANT
NAIL CANN TFNA 9MM/130 360MM (Nail) ×3 IMPLANT
NS IRRIG 1000ML POUR BTL (IV SOLUTION) ×3 IMPLANT
PACK HIP COMPR (MISCELLANEOUS) ×3 IMPLANT
REAMER ROD DEEP FLUTE 2.5X950 (INSTRUMENTS) ×3 IMPLANT
STAPLER SKIN PROX 35W (STAPLE) ×3 IMPLANT
SUT VIC AB 0 CT1 36 (SUTURE) ×3 IMPLANT
SUT VIC AB 2-0 CT1 27 (SUTURE) ×2
SUT VIC AB 2-0 CT1 TAPERPNT 27 (SUTURE) ×1 IMPLANT
TOWEL OR 17X26 4PK STRL BLUE (TOWEL DISPOSABLE) ×3 IMPLANT

## 2017-12-02 NOTE — Clinical Social Work Placement (Signed)
   CLINICAL SOCIAL WORK PLACEMENT  NOTE  Date:  12/02/2017  Patient Details  Name: Sandy Daniel MRN: [id number] Date of Birth: 03-31-1928  Clinical Social Work is seeking post-discharge placement for this patient at the Skilled  Nursing Facility level of care (*CSW will initial, date and re-position this form in  chart as items are completed):  Yes   Patient/family provided with [place] Clinical Social Work Department's list of facilities offering this level of care within the geographic area requested by the patient (or if unable, by the patient's family).  Yes   Patient/family informed of their freedom to choose among providers that offer the needed level of care, that participate in Medicare, Medicaid or managed care program needed by the patient, have an available bed and are willing to accept the patient.  Yes   Patient/family informed of [facility]'s ownership interest in [facility] and [facility], as well as of the fact that they are under no obligation to receive care at these facilities.  PASRR submitted to EDS on       PASRR number received on       Existing PASRR number confirmed on 12/02/17     FL2 transmitted to all facilities in geographic area requested by pt/family on 12/02/17     FL2 transmitted to all facilities within larger geographic area on       Patient informed that his/her managed care company has contracts with or will negotiate with certain facilities, including the following:            Patient/family informed of bed offers received.  Patient chooses bed at       Physician recommends and patient chooses bed at      Patient to be transferred to   on  .  Patient to be transferred to facility by       Patient family notified on   of transfer.  Name of family member notified:        PHYSICIAN       Additional Comment:    _______________________________________________ Ruba Outen, [name], LCSW 12/02/2017, 5:10 PM

## 2017-12-02 NOTE — Progress Notes (Signed)
Attempted to encourage patient to void several times during the shift.  Pt. Bladder scanned & noted to have greater than 800 per reading.  Will continue to monitor the need for further intervention.

## 2017-12-02 NOTE — Op Note (Signed)
DATE OF SURGERY:  12/02/2017  TIME: 1:57 PM  PATIENT NAME:  Sandy Daniel  AGE: 84 y.o.  PRE-OPERATIVE DIAGNOSIS:  LEFT HIP FRACTURE  POST-OPERATIVE DIAGNOSIS:  SAME  PROCEDURE:  INTRAMEDULLARY (IM) NAIL INTERTROCHANTRIC- LEFT TFNA  SURGEON:  Lyndle Herrlich  EBL:  100 cc  COMPLICATIONS:  None apparent  OPERATIVE IMPLANTS: Synthes trochanteric femoral nail 360 mm by 9 mm  with interlocking helical blade  105 mm  PREOPERATIVE INDICATIONS:  Sandy Daniel is a 84 y.o. year old who fell and suffered a hip fracture. She was brought into the ER and then admitted and optimized and then elected for surgical intervention.    The risks benefits and alternatives were discussed with the patient including but not limited to the risks of nonoperative treatment, versus surgical intervention including infection, bleeding, nerve injury, malunion, nonunion, hardware prominence, hardware failure, need for hardware removal, blood clots, cardiopulmonary complications, morbidity, mortality, among others, and they were willing to proceed.    OPERATIVE PROCEDURE:  The patient was brought to the operating room and placed in the supine position.  Spinal anesthesia was administered, with a foley. She was placed on the fracture table.  Closed reduction was performed under C-arm guidance. The length of the femur was also measured using fluoroscopy. Time out was then performed after sterile prep and drape. She received preoperative antibiotics.  Incision was made proximal to the greater trochanter. A guidewire was placed in the appropriate position. Confirmation was made on AP and lateral views. The above-named nail was opened. I opened the proximal femur with a reamer. I then placed the nail by hand easily down. I did not need to ream the femur.  Once the nail was completely seated, I placed a guidepin into the femoral head into the center center position through a second incision.  I measured the length,  and then reamed the lateral cortex and up into the head. I then placed the helical blade. Slight compression was applied. Anatomic fixation achieved. Bone quality was poor.  I then secured the proximal interlock.  I then removed the instruments, and took final C-arm pictures AP and lateral the entire length of the leg. Anatomic reconstruction was achieved, and the wounds were irrigated copiously and closed with Vicryl  followed by staples and dry sterile dressing. Sponge and needle count were correct.   The patient was awakened and returned to PACU in stable and satisfactory condition. There no complications and the patient tolerated the procedure well.  She will be weightbearing as tolerated.    Lyndle Herrlich

## 2017-12-02 NOTE — Progress Notes (Signed)
Night MD notified about pt. having issues voiding & the result of bladder scan.  New order received. Attempted to encourage pt. to void prior to insertion attempt, and patient began voiding with success.  Will continue to monitor output.

## 2017-12-02 NOTE — Progress Notes (Signed)
Pt readmitted to unit from PACU. Pt is drowsy, but arousable, VSS. Honeycomb dressings and ace wrap CDI to left hip. Pt and sisters oriented to plan of care. Bed in lowest position, call bell within reach.

## 2017-12-02 NOTE — Anesthesia Procedure Notes (Signed)
Spinal  Patient location during procedure: OR Start time: 12/02/2017 12:18 PM End time: 12/02/2017 12:29 PM Staffing Anesthesiologist: Emmie Niemann, MD Resident/CRNA: Allean Found, CRNA Performed: resident/CRNA and anesthesiologist  Preanesthetic Checklist Completed: patient identified, site marked, surgical consent, pre-op evaluation, timeout performed, IV checked, risks and benefits discussed and monitors and equipment checked Spinal Block Patient position: sitting Prep: ChloraPrep Patient monitoring: heart rate, continuous pulse ox and blood pressure Approach: midline Location: L4-5 Injection technique: single-shot Needle Needle type: Introducer and Pencil-Tip  Needle gauge: 24 G Needle length: 9 cm Additional Notes Negative paresthesia. Negative blood return. Positive free-flowing CSF. Expiration date of kit checked and confirmed. Patient tolerated procedure well, without complications.

## 2017-12-02 NOTE — Anesthesia Post-op Follow-up Note (Signed)
Anesthesia QCDR form completed.        
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
## 2017-12-02 NOTE — Progress Notes (Signed)
[facility] [place] [facility]   PATIENT NAME: Sandy Daniel    MR#:  [id number]  DATE OF BIRTH:  August 11, 1928  SUBJECTIVE:  CHIEF COMPLAINT:   Chief Complaint  Patient presents with  . Fall   -Fall and left hip fracture.  Going to or today.  Other than pain, no other complaints at this time  REVIEW OF SYSTEMS:  Review of Systems  Constitutional: Positive for malaise/fatigue. Negative for chills and fever.  Respiratory: Negative for cough, shortness of breath and wheezing.   Cardiovascular: Negative for chest pain and palpitations.  Gastrointestinal: Negative for abdominal pain, constipation, diarrhea, nausea and vomiting.  Genitourinary: Negative for dysuria.  Musculoskeletal: Positive for joint pain and myalgias.  Neurological: Negative for dizziness, focal weakness, seizures, weakness and headaches.  Psychiatric/Behavioral: Negative for depression.    DRUG ALLERGIES:   Allergies  Allergen Reactions  . Motrin [Ibuprofen] Shortness Of Breath and Swelling    Swelling of lip/tongue  . Tolmetin Rash  . Alendronate     Other reaction(s): Nausea And Vomiting Other reaction(s): Nausea And Vomiting  . Other Rash    Any Steriods. Causes mouth sores  . Tramadol Hcl Nausea And Vomiting    VITALS:  Blood pressure 127/71, pulse 65, temperature (!) 97.3 F (36.3 C), resp. rate 17, height 5' (1.524 m), weight 64.4 kg, SpO2 97 %.  PHYSICAL EXAMINATION:  Physical Exam  GENERAL:  84 y.o.-year-old elderly patient lying in the bed with no acute distress.  EYES: Pupils equal, round, reactive to light and accommodation. No scleral icterus. Extraocular muscles intact.  HEENT: Head atraumatic, normocephalic. Oropharynx and nasopharynx clear.  NECK:  Supple, no jugular venous distention. No thyroid enlargement, no tenderness.  LUNGS: Normal breath sounds bilaterally, no wheezing, rales,rhonchi or crepitation. No use of accessory muscles of respiration.  decreased bibasilar breath sounds CARDIOVASCULAR: S1, S2 normal. No   rubs, or gallops. 3/6 systolic murmur present ABDOMEN: Soft, nontender, nondistended. Bowel sounds present. No organomegaly or mass.  EXTREMITIES: No pedal edema, cyanosis, or clubbing. Left leg is abducted and externally rotated NEUROLOGIC: Cranial nerves II through XII are intact. Muscle strength 5/5 in all extremities except left leg due to pain. Sensation intact. Gait not checked.  PSYCHIATRIC: The patient is alert and oriented x 3.  SKIN: No obvious rash, lesion, or ulcer.    LABORATORY PANEL:   CBC Recent Labs  Lab 12/02/17 0647  WBC 5.3  HGB 12.7  HCT 42.5  PLT 228   ------------------------------------------------------------------------------------------------------------------  Chemistries  Recent Labs  Lab 12/01/17 1701 12/02/17 0647  NA 139 142  K 3.6 3.8  CL 102 106  CO2 28 30  GLUCOSE 95 83  BUN 18 18  CREATININE 0.71 0.67  CALCIUM 8.2* 8.2*  AST 25  --   ALT 16  --   ALKPHOS 175*  --   BILITOT 0.5  --    ------------------------------------------------------------------------------------------------------------------  Cardiac Enzymes Recent Labs  Lab 12/01/17 1701  TROPONINI <0.03   ------------------------------------------------------------------------------------------------------------------  RADIOLOGY:  Dg Chest 1 View  Result Date: 12/01/2017 CLINICAL DATA:  Left hip pain due to a fall today. EXAM: CHEST  1 VIEW COMPARISON:  PA and lateral chest 10/25/2017 and 07/11/2016. CT chest 07/11/2016. FINDINGS: The lungs are emphysematous. Small area of linear scar in the left upper lobe is unchanged. There is cardiomegaly. Atherosclerosis is noted. No pneumothorax or pleural effusion. No acute bony abnormality. Thoracolumbar scoliosis is again seen. IMPRESSION: No acute disease. Emphysema. Cardiomegaly. Atherosclerosis. Electronically  Signed   By: Drusilla Kanner M.D.   On:  12/01/2017 17:22   Dg Hip Operative Unilat W Or W/o Pelvis Left  Result Date: 12/02/2017 CLINICAL DATA:  84 year old female with left intertrochanteric fracture. Subsequent encounter. EXAM: OPERATIVE left HIP (WITH PELVIS IF PERFORMED) 3 VIEWS TECHNIQUE: Fluoroscopic spot image(s) were submitted for interpretation post-operatively. Fluoroscopic time: 31 seconds. COMPARISON:  12/01/2017. FINDINGS: Three intraoperative C-arm views submitted for review after surgery. Left femoral intramedullary rod with proximal sliding type screw placed. Femoral head component contained within the femoral head. Separation of fracture fragments. IMPRESSION: Open reduction and internal fixation left intertrochanteric fracture. Electronically Signed   By: Lacy Duverney M.D.   On: 12/02/2017 14:00   Dg Hip Unilat W Or W/o Pelvis 2-3 Views Left  Result Date: 12/01/2017 CLINICAL DATA:  Left hip pain post fall. EXAM: DG HIP (WITH OR WITHOUT PELVIS) 2-3V LEFT COMPARISON:  12/15/2016 FINDINGS: There is a comminuted impacted intertrochanteric fracture of the left femur with superior displacement of the distal fracture fragment. The left femoral head is normally located. Osteopenia. Vascular calcifications and postsurgical changes in the abdomen noted. IMPRESSION: Comminuted impacted intertrochanteric fracture of the left femur with superior displacement of the distal fracture fragment. Electronically Signed   By: Ted Mcalpine M.D.   On: 12/01/2017 17:21    EKG:   Orders placed or performed in visit on 11/18/17  . EKG 12-Lead    ASSESSMENT AND PLAN:   84 year old female with past medical history significant for CAD, COPD on home oxygen at bedtime, seizures, hypertension presents to hospital secondary to fall  1.  Fall and left hip fracture-x-ray showing left intertrochanteric fracture -Orthopedics consult.  Go to go to OR today -Postop pain control, physical therapy -Monitor hemoglobin after surgery.  DVT  prophylaxis will be started after surgery.  2.  Depression and anxiety-on BuSpar and Zoloft  3.  Seizure disorder-stable.  Continue Dilantin  4.  Ongoing smoking-counseled, nicotine patch  5.  DVT prophylaxis-will be started after surgery  Physical therapy consulted.  Patient ambulates with a Rollator at baseline   All the records are reviewed and case discussed with Care Management/Social Workerr. Management plans discussed with the patient, family and they are in agreement.  CODE STATUS: DNR  TOTAL TIME TAKING CARE OF THIS PATIENT: 38 minutes.   POSSIBLE D/C IN 2-3 DAYS, DEPENDING ON CLINICAL CONDITION.   Enid Baas M.D on 12/02/2017 at 3:02 PM  Between 7am to 6pm - Pager - [phone]  After 6pm go to [email or web address] - Social research officer, government  [facility]  Office  [phone]  CC: Primary care physician; Galen Manila, NP

## 2017-12-02 NOTE — Consult Note (Signed)
ORTHOPAEDIC CONSULTATION  REQUESTING PHYSICIAN: Enid Baas, MD  Chief Complaint: left hip pain  HPI: Sandy Daniel is a 84 y.o. female who complains of left hip pain after a fall. Please see H&P and ED notes for details. Denies any numbness, tingling or constitutional symptoms.  Past Medical History:  Diagnosis Date  . Arthritis   . Asthma   . B12 deficiency   . CAD (coronary artery disease)   . COPD (chronic obstructive pulmonary disease) (HCC)   . Depression   . Epilepsy (HCC)   . GERD (gastroesophageal reflux disease)   . Hyperlipidemia   . Hypertension   . Hypoxemia   . Macular degeneration (senile) of retina   . Macular degeneration of both eyes   . MI (myocardial infarction) (HCC) 09/2003, 05/2013  . Physiological tremor   . Presence of stent of CABG    x2   . Seizures (HCC)    Past Surgical History:  Procedure Laterality Date  . ABDOMINAL HYSTERECTOMY    . APPENDECTOMY    . CARDIAC CATHETERIZATION  05/2013  . CARDIAC SURGERY    . CATARACT EXTRACTION  03/2016  . CHOLECYSTECTOMY  1962  . CORONARY ANGIOPLASTY WITH STENT PLACEMENT  Aug. 2005 and March 2015  . ROTATOR CUFF REPAIR Right   . VENTRAL HERNIA REPAIR  2002   Social History   Socioeconomic History  . Marital status: Widowed    Spouse name: Not on file  . Number of children: Not on file  . Years of education: 6  . Highest education level: Not on file  Occupational History  . Not on file  Social Needs  . Financial resource strain: Not hard at all  . Food insecurity:    Worry: Never true    Inability: Never true  . Transportation needs:    Medical: No    Non-medical: No  Tobacco Use  . Smoking status: Current Every Day Smoker    Packs/day: 1.00    Years: 50.00    Pack years: 50.00    Types: Cigarettes  . Smokeless tobacco: Never Used  . Tobacco comment: 10 a day   Substance and Sexual Activity  . Alcohol use: No  . Drug use: No  . Sexual activity: Never  Lifestyle  .  Physical activity:    Days per week: 0 days    Minutes per session: 0 min  . Stress: Not at all  Relationships  . Social connections:    Talks on phone: Once a week    Gets together: Never    Attends religious service: Never    Active member of club or organization: No    Attends meetings of clubs or organizations: Never    Relationship status: Widowed  Other Topics Concern  . Not on file  Social History Narrative  . Not on file   Family History  Problem Relation Age of Onset  . Diabetes Mother   . Cancer Sister   . Diabetes Sister   . Heart disease Sister   . Heart attack Sister   . Hyperlipidemia Sister   . Hypertension Sister   . Cancer Brother   . Diabetes Brother   . Heart disease Brother   . Heart attack Brother   . Hyperlipidemia Brother   . Hypertension Brother   . Diabetes Brother   . Diabetes Brother   . Kidney failure Brother    Allergies  Allergen Reactions  . Motrin [Ibuprofen] Shortness Of Breath and Swelling  Swelling of lip/tongue  . Tolmetin Rash  . Alendronate     Other reaction(s): Nausea And Vomiting Other reaction(s): Nausea And Vomiting  . Other Rash    Any Steriods. Causes mouth sores  . Tramadol Hcl Nausea And Vomiting   Prior to Admission medications   Medication Sig Start Date End Date Taking? Authorizing Provider  albuterol (PROVENTIL HFA;VENTOLIN HFA) 108 (90 Base) MCG/ACT inhaler Inhale 2 puffs into the lungs every 6 (six) hours as needed for wheezing or shortness of breath. 08/13/17  Yes Galen Manila, NP  albuterol (PROVENTIL) (2.5 MG/3ML) 0.083% nebulizer solution Take 3 mLs (2.5 mg total) by nebulization every 6 (six) hours as needed for Wheezing. 08/13/17  Yes Galen Manila, NP  aspirin 81 MG tablet Take 1 tablet (81 mg total) by mouth daily. 11/08/17  Yes Galen Manila, NP  atorvastatin (LIPITOR) 10 MG tablet Take 1 tablet (10 mg total) by mouth daily. 08/13/17  Yes Galen Manila, NP  busPIRone  (BUSPAR) 10 MG tablet Take 1 tablet (10 mg total) by mouth 2 (two) times daily. 08/13/17  Yes Galen Manila, NP  carvedilol (COREG) 3.125 MG tablet Take 3.125 mg by mouth 2 (two) times daily with a meal.   Yes [provider]  Fluticasone-Salmeterol (ADVAIR) 100-50 MCG/DOSE AEPB Inhale 1 puff into the lungs 2 (two) times daily. 08/13/17  Yes Galen Manila, NP  furosemide (LASIX) 20 MG tablet Take 0.5 tablets (10 mg total) by mouth daily. 08/13/17  Yes Galen Manila, NP  gabapentin (NEURONTIN) 400 MG capsule Take 2 capsules (800 mg total) by mouth 3 (three) times daily. 08/13/17  Yes Galen Manila, NP  HYDROcodone-acetaminophen (NORCO) 7.5-325 MG tablet Take 1 tablet by mouth 2 (two) times daily as needed for moderate pain. For chronic pain. To fill on or after 09/18/2017, 10/18/2017, 11/16/2017 09/17/17  Yes Edward Jolly, MD  nitroGLYCERIN (NITROSTAT) 0.4 MG SL tablet Place 1 tablet (0.4 mg total) under the tongue every 5 (five) minutes as needed for chest pain. 06/12/17  Yes Galen Manila, NP  pantoprazole (PROTONIX) 40 MG tablet Take 1 tablet (40 mg total) by mouth daily. 08/13/17  Yes Galen Manila, NP  phenytoin (DILANTIN) 100 MG ER capsule TAKE 2 CAPSULES (200MG ) BY MOUTH AT BEDTIME Patient taking differently: Take 200 mg by mouth at bedtime.  09/20/17  Yes Galen Manila, NP  QUEtiapine (SEROQUEL) 50 MG tablet Take 1 tablet (50 mg total) by mouth at bedtime. 08/13/17  Yes Galen Manila, NP  sertraline (ZOLOFT) 100 MG tablet Take 1 tablet (100 mg total) by mouth daily. 08/13/17  Yes Galen Manila, NP   Dg Chest 1 View  Result Date: 12/01/2017 CLINICAL DATA:  Left hip pain due to a fall today. EXAM: CHEST  1 VIEW COMPARISON:  PA and lateral chest 10/25/2017 and 07/11/2016. CT chest 07/11/2016. FINDINGS: The lungs are emphysematous. Small area of linear scar in the left upper lobe is unchanged. There is cardiomegaly. Atherosclerosis is  noted. No pneumothorax or pleural effusion. No acute bony abnormality. Thoracolumbar scoliosis is again seen. IMPRESSION: No acute disease. Emphysema. Cardiomegaly. Atherosclerosis. Electronically Signed   By: Drusilla Kanner M.D.   On: 12/01/2017 17:22   Dg Hip Unilat W Or W/o Pelvis 2-3 Views Left  Result Date: 12/01/2017 CLINICAL DATA:  Left hip pain post fall. EXAM: DG HIP (WITH OR WITHOUT PELVIS) 2-3V LEFT COMPARISON:  12/15/2016 FINDINGS: There is a comminuted impacted intertrochanteric fracture of  the left femur with superior displacement of the distal fracture fragment. The left femoral head is normally located. Osteopenia. Vascular calcifications and postsurgical changes in the abdomen noted. IMPRESSION: Comminuted impacted intertrochanteric fracture of the left femur with superior displacement of the distal fracture fragment. Electronically Signed   By: Ted Mcalpine M.D.   On: 12/01/2017 17:21    Positive ROS: All other systems have been reviewed and were otherwise negative with the exception of those mentioned in the HPI and as above.  Physical Exam: General: Alert, no acute distress Cardiovascular: No pedal edema Respiratory: No cyanosis, no use of accessory musculature GI: No organomegaly, abdomen is soft and non-tender Skin: No lesions in the area of chief complaint Neurologic: Sensation intact distally Psychiatric: Patient is competent for consent with normal mood and affect Lymphatic: No axillary or cervical lymphadenopathy  MUSCULOSKELETAL: left hip pain with any movement. Compartments soft. Good cap refill. Motor and sensory intact distally. RLE with FROM and non-tender  Assessment: Left hip intertrochanteric fracture, closed, displaced  Plan: Plan trochanteric femoral nail.  The diagnosis, risks, benefits and alternatives to treatment are all discussed in detail with the patient and family. Risks include but are not limited to bleeding, infection, deep vein  thrombosis, pulmonary embolism, nerve or vascular injury, non-union, repeat operation, persistent pain, weakness, stiffness and death. She understands and is eager to proceed.     Lyndle Herrlich, MD    12/02/2017 11:11 AM

## 2017-12-02 NOTE — NC FL2 (Signed)
[place] MEDICAID FL2 LEVEL OF CARE SCREENING TOOL     IDENTIFICATION  Patient Name: Sandy Daniel Birthdate: Mar 29, 1928 Sex: female Admission Date (Current Location): 12/01/2017  [place] and [place] Number:  Chiropodist and Address:  [facility], [address], [place], [place] [address]      Provider Number: [id number]  Attending Physician Name and Address:  Enid Baas, MD  Relative Name and Phone Number:       Current Level of Care: Hospital Recommended Level of Care: Skilled Nursing Facility Prior Approval Number:    Date Approved/Denied:   PASRR Number: [id number])  Discharge Plan: SNF    Current Diagnoses: Patient Active Problem List   Diagnosis Date Noted  . Hip fracture (HCC) 12/01/2017  . COPD with acute exacerbation (HCC) 10/21/2017  . Cervical spondylosis without myelopathy 09/17/2017  . Disability of walking 09/17/2017  . Chronic pain syndrome 09/17/2017  . DDD (degenerative disc disease), cervical 09/17/2017  . Chest pain 09/07/2017  . 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 Major depressive disorder with single episode, in partial remission (HCC) 05/15/2017  . Neck pain 01/31/2017  . Chronic right shoulder pain 01/31/2017  . Chronic bilateral thoracic back pain 12/04/2016  . Muscle spasm of back 10/22/2016  . (HFpEF) heart failure with preserved ejection fraction (HCC) 09/11/2016  . Anxiety 07/16/2016  . Chronic bilateral low back pain 07/16/2016  . Unstable gait 07/16/2016  . Controlled substance agreement signed 03/27/2016  . Dysphagia 03/26/2016  . Panic disorder 03/26/2016  . CAD (coronary artery disease), native coronary artery   . Seizure disorder (HCC)   . Macular degeneration of both eyes   . B12 deficiency   . COPD (chronic obstructive pulmonary disease) (HCC)   . HTN (hypertension)   . Hyperlipidemia   . Arthritis   . Depression   . GERD (gastroesophageal  reflux disease)   . Physiological tremor 02/18/2014    Orientation RESPIRATION BLADDER Height & Weight     Self, Time, Situation, Place  O2(2 Liters Oxygen. ) Continent Weight: 142 lb (64.4 kg) Height:  5' (152.4 cm)  BEHAVIORAL SYMPTOMS/MOOD NEUROLOGICAL BOWEL NUTRITION STATUS      Continent Diet(Diet: NPO for surgery to be advanced. )  AMBULATORY STATUS COMMUNICATION OF NEEDS Skin   Extensive Assist Verbally Surgical wounds                       Personal Care Assistance Level of Assistance  Bathing, Feeding, Dressing Bathing Assistance: Limited assistance Feeding assistance: Independent Dressing Assistance: Limited assistance     Functional Limitations Info  Sight, Hearing, Speech Sight Info: Adequate Hearing Info: Adequate Speech Info: Adequate    SPECIAL CARE FACTORS FREQUENCY  PT (By licensed PT), OT (By licensed OT)     PT Frequency: (5) OT Frequency: (5)            Contractures      Additional Factors Info  Code Status, Allergies Code Status Info: (DNR ) Allergies Info: (Motrin Ibuprofen, Tolmetin, Alendronate, Other, Tramadol Hcl)           Current Medications (12/02/2017):  This is the current hospital active medication list Current Facility-Administered Medications  Medication Dose Route Frequency Provider Last Rate Last Dose  . [MAR Hold] acetaminophen (TYLENOL) tablet 650 mg  650 mg Oral Q6H PRN Ihor Austin, MD       Or  . Mitzi Hansen Hold] acetaminophen (TYLENOL) suppository 650 mg  650 mg  Rectal Q6H PRN Ihor Austin, MD      . Mitzi Hansen Hold] albuterol (PROVENTIL) (2.5 MG/3ML) 0.083% nebulizer solution 2.5 mg  2.5 mg Nebulization Q6H PRN Ihor Austin, MD   2.5 mg at 12/02/17 0906  . [MAR Hold] atorvastatin (LIPITOR) tablet 10 mg  10 mg Oral Daily Pyreddy, Vivien Rota, MD      . Mitzi Hansen Hold] busPIRone (BUSPAR) tablet 10 mg  10 mg Oral BID Ihor Austin, MD   10 mg at 12/01/17 2145  . ceFAZolin (ANCEF) 2-4 GM/100ML-% IVPB           . [MAR Hold]  ceFAZolin (ANCEF) IVPB 2g/100 mL premix  2 g Intravenous 30 min Pre-Op Lyndle Herrlich, MD      . Mitzi Hansen Hold] gabapentin (NEURONTIN) capsule 800 mg  800 mg Oral TID Ihor Austin, MD   800 mg at 12/01/17 2144  . [MAR Hold] HYDROcodone-acetaminophen (NORCO/VICODIN) 5-325 MG per tablet 1-2 tablet  1-2 tablet Oral Q4H PRN Ihor Austin, MD   1 tablet at 12/01/17 1957  . lactated ringers infusion   Intravenous Continuous Penwarden, Amy, MD 50 mL/hr at 12/02/17 1150    . [MAR Hold] mometasone-formoterol (DULERA) 100-5 MCG/ACT inhaler 2 puff  2 puff Inhalation BID Ihor Austin, MD   2 puff at 12/02/17 0804  . [MAR Hold] morphine 2 MG/ML injection 2 mg  2 mg Intravenous Q4H PRN Ihor Austin, MD   2 mg at 12/02/17 0758  . [MAR Hold] nicotine (NICODERM CQ - dosed in mg/24 hours) patch 21 mg  21 mg Transdermal Daily Pyreddy, Vivien Rota, MD   21 mg at 12/01/17 2323  . [MAR Hold] nitroGLYCERIN (NITROSTAT) SL tablet 0.4 mg  0.4 mg Sublingual Q5 min PRN Ihor Austin, MD      . Mitzi Hansen Hold] ondansetron (ZOFRAN) tablet 4 mg  4 mg Oral Q6H PRN Ihor Austin, MD       Or  . Mitzi Hansen Hold] ondansetron (ZOFRAN) injection 4 mg  4 mg Intravenous Q6H PRN Pyreddy, Vivien Rota, MD      . Mitzi Hansen Hold] pantoprazole (PROTONIX) EC tablet 40 mg  40 mg Oral Daily Pyreddy, Vivien Rota, MD      . Mitzi Hansen Hold] phenytoin (DILANTIN) ER capsule 200 mg  200 mg Oral QHS Pyreddy, Vivien Rota, MD   200 mg at 12/01/17 2144  . [MAR Hold] QUEtiapine (SEROQUEL) tablet 50 mg  50 mg Oral QHS Ihor Austin, MD   50 mg at 12/01/17 2145  . [MAR Hold] senna-docusate (Senokot-S) tablet 1 tablet  1 tablet Oral QHS PRN Ihor Austin, MD      . Mitzi Hansen Hold] sertraline (ZOLOFT) tablet 100 mg  100 mg Oral Daily Pyreddy, Vivien Rota, MD         Discharge Medications: Please see discharge summary for a list of discharge medications.  Relevant Imaging Results:  Relevant Lab Results:   Additional Information (SSN: [id number])  Sandy Daniel, [name], LCSW

## 2017-12-02 NOTE — Progress Notes (Signed)
Anesthesia notified about oxygen saturations 90-92% on 2L [place]. Patient seems to be breathing mostly through her mouth and is sleeping.

## 2017-12-02 NOTE — Anesthesia Preprocedure Evaluation (Signed)
Anesthesia Evaluation  Patient identified by MRN, date of birth, ID band Patient awake    Reviewed: Allergy & Precautions, NPO status , Patient's Chart, lab work & pertinent test results  History of Anesthesia Complications Negative for: history of anesthetic complications  Airway Mallampati: II  TM Distance: >3 FB Neck ROM: Full    Dental  (+) Edentulous Upper, Edentulous Lower   Pulmonary asthma , COPD,  oxygen dependent, Current Smoker,    breath sounds clear to auscultation- rhonchi (-) wheezing      Cardiovascular hypertension, + CAD, + Past MI and + Cardiac Stents   Rhythm:Regular Rate:Normal - Systolic murmurs and - Diastolic murmurs    Neuro/Psych Seizures -, Well Controlled,  PSYCHIATRIC DISORDERS Anxiety Depression    GI/Hepatic Neg liver ROS, GERD  ,  Endo/Other  negative endocrine ROSneg diabetes  Renal/GU negative Renal ROS     Musculoskeletal  (+) Arthritis ,   Abdominal (+) - obese,   Peds  Hematology negative hematology ROS (+)   Anesthesia Other Findings Past Medical History: No date: Arthritis No date: Asthma No date: B12 deficiency No date: CAD (coronary artery disease) No date: COPD (chronic obstructive pulmonary disease) (HCC) No date: Depression No date: Epilepsy (HCC) No date: GERD (gastroesophageal reflux disease) No date: Hyperlipidemia No date: Hypertension No date: Hypoxemia No date: Macular degeneration (senile) of retina No date: Macular degeneration of both eyes 09/2003, 05/2013: MI (myocardial infarction) (HCC) No date: Physiological tremor No date: Presence of stent of CABG     Comment:  x2  No date: Seizures (HCC)   Reproductive/Obstetrics                             Lab Results  Component Value Date   WBC 5.3 12/02/2017   HGB 12.7 12/02/2017   HCT 42.5 12/02/2017   MCV 95.3 12/02/2017   PLT 228 12/02/2017    Anesthesia  Physical Anesthesia Plan  ASA: IV  Anesthesia Plan: Spinal   Post-op Pain Management:    Induction:   PONV Risk Score and Plan: 1 and Propofol infusion  Airway Management Planned: Natural Airway and Simple Face Mask  Additional Equipment:   Intra-op Plan:   Post-operative Plan:   Informed Consent: I have reviewed the patients History and Physical, chart, labs and discussed the procedure including the risks, benefits and alternatives for the proposed anesthesia with the patient or authorized representative who has indicated his/her understanding and acceptance.   Dental advisory given  Plan Discussed with: CRNA and Anesthesiologist  Anesthesia Plan Comments:         Anesthesia Quick Evaluation

## 2017-12-02 NOTE — Progress Notes (Signed)
   12/02/17 1835  Clinical Encounter Type  Visited With Patient  Visit Type Initial  Referral From Nurse  Consult/Referral To Chaplain  Recommendations follow up Tuesday, October 22   Chaplain followed up on order for patient support.  Patient reported being in pain and declined visit.  Chaplain will ask unit chaplain to follow up in Tuesday morning report.

## 2017-12-02 NOTE — Transfer of Care (Signed)
Immediate Anesthesia Transfer of Care Note  Patient: Sandy Daniel  Procedure(s) Performed: INTRAMEDULLARY (IM) NAIL INTERTROCHANTRIC- LEFT TFNA (Left Hip)  Patient Location: PACU  Anesthesia Type:Spinal  Level of Consciousness: awake and sedated  Airway & Oxygen Therapy: Patient Spontanous Breathing and Patient connected to face mask oxygen  Post-op Assessment: Report given to RN and Post -op Vital signs reviewed and stable  Post vital signs: Reviewed and stable  Last Vitals:  Vitals Value Taken Time  BP 109/51 12/02/2017  1:54 PM  Temp    Pulse 68 12/02/2017  1:54 PM  Resp 16 12/02/2017  1:54 PM  SpO2 97 % 12/02/2017  1:54 PM  Vitals shown include unvalidated device data.  Last Pain:  Vitals:   12/02/17 1137  TempSrc: Temporal  PainSc:       Patients Stated Pain Goal: 2 (12/02/17 0406)  Complications: No apparent anesthesia complications

## 2017-12-03 ENCOUNTER — Inpatient Hospital Stay: Payer: Medicare HMO

## 2017-12-03 ENCOUNTER — Encounter: Payer: Self-pay | Admitting: Orthopedic Surgery

## 2017-12-03 LAB — CBC
HEMATOCRIT: 34.7 % — AB (ref 36.0–46.0)
Hemoglobin: 10.7 g/dL — ABNORMAL LOW (ref 12.0–15.0)
MCH: 28.9 pg (ref 26.0–34.0)
MCHC: 30.8 g/dL (ref 30.0–36.0)
MCV: 93.8 fL (ref 80.0–100.0)
Platelets: 167 10*3/uL (ref 150–400)
RBC: 3.7 MIL/uL — AB (ref 3.87–5.11)
RDW: 16.3 % — ABNORMAL HIGH (ref 11.5–15.5)
WBC: 6.1 10*3/uL (ref 4.0–10.5)
nRBC: 0 % (ref 0.0–0.2)

## 2017-12-03 LAB — BASIC METABOLIC PANEL
Anion gap: 6 (ref 5–15)
BUN: 15 mg/dL (ref 8–23)
CO2: 29 mmol/L (ref 22–32)
CREATININE: 0.69 mg/dL (ref 0.44–1.00)
Calcium: 7.9 mg/dL — ABNORMAL LOW (ref 8.9–10.3)
Chloride: 102 mmol/L (ref 98–111)
GFR calc Af Amer: >60 mL/min (ref >60)
GFR calc non Af Amer: >60 mL/min (ref >60)
GLUCOSE: 90 mg/dL (ref 70–99)
POTASSIUM: 3.7 mmol/L (ref 3.5–5.1)
Sodium: 137 mmol/L (ref 135–145)

## 2017-12-03 LAB — URINALYSIS, COMPLETE (UACMP) WITH MICROSCOPIC
BACTERIA UA: NONE SEEN
BILIRUBIN URINE: NEGATIVE
Glucose, UA: NEGATIVE mg/dL
Hgb urine dipstick: NEGATIVE
KETONES UR: NEGATIVE mg/dL
Nitrite: NEGATIVE
PH: 6 (ref 5.0–8.0)
Protein, ur: NEGATIVE mg/dL
Specific Gravity, Urine: 1.021 (ref 1.005–1.030)

## 2017-12-03 MED ORDER — ENOXAPARIN SODIUM 40 MG/0.4ML ~~LOC~~ SOLN
40.0000 mg | SUBCUTANEOUS | Status: DC
  Administered 2017-12-03 – 2017-12-06 (×4): 40 mg via SUBCUTANEOUS
  Filled 2017-12-03 (×4): qty 0.4

## 2017-12-03 MED ORDER — LIDOCAINE 5 % EX PTCH
1.0000 | MEDICATED_PATCH | CUTANEOUS | Status: DC
  Administered 2017-12-03 – 2017-12-06 (×4): 1 via TRANSDERMAL
  Filled 2017-12-03 (×4): qty 1

## 2017-12-03 NOTE — Anesthesia Postprocedure Evaluation (Signed)
Anesthesia Post Note  Patient: Sandy Daniel  Procedure(s) Performed: INTRAMEDULLARY (IM) NAIL INTERTROCHANTRIC- LEFT TFNA (Left Hip)  Patient location during evaluation: Nursing Unit Anesthesia Type: Spinal Level of consciousness: awake, awake and alert and oriented Pain management: pain level controlled Vital Signs Assessment: post-procedure vital signs reviewed and stable Respiratory status: spontaneous breathing, nonlabored ventilation and respiratory function stable Cardiovascular status: blood pressure returned to baseline and stable Postop Assessment: no headache and no backache Anesthetic complications: no     Last Vitals:  Vitals:   12/03/17 0316 12/03/17 0833  BP: 136/60 135/77  Pulse: 91 68  Resp: 20 16  Temp: (!) 38.2 C 36.8 C  SpO2: 96% 97%    Last Pain:  Vitals:   12/03/17 0833  TempSrc: Oral  PainSc:                  Ginger Carne

## 2017-12-03 NOTE — Progress Notes (Signed)
Physical Therapy Treatment Patient Details Name: Sandy Daniel MRN: [id number] DOB: 1928/07/23 Today's Date: 12/03/2017    History of Present Illness Pt was admitted for hip fx on 12/01/2017. Imaging showed comminuted impacted intertrochanteric fx of the L femur with wupreior dispacement of the distal fx fragment. Following day she had procedure of ORIF L intertrochanteric fx without complication. Pt's PMH includes CAD, COPD, seizure disorder, HLD, HTN, GERD, MI, and per pt RA, uses O2 chronically at bedtime.    PT Comments    Pt is very gradually progressing toward her goals. Pt had reduced pain this afternoon. Pt educated on HEP including frequency and duration, visual aid provided. Pt demonstrated increased tolerance for ther-ex performing more exercises. Heavy VC for encouragement used t/o session for participation. Pt in too much pain to move to sit EOB, but tolerated more bed mobility scooting toward HOB, require Mod A with +2 physical assist. Will continue to progress pt as able with mobility/strength/balance.  Follow Up Recommendations  SNF     Equipment Recommendations  Rolling walker with 5" wheels    Recommendations for Other Services       Precautions / Restrictions Precautions Precautions: Fall Restrictions Weight Bearing Restrictions: Yes LLE Weight Bearing: Weight bearing as tolerated    Mobility  Bed Mobility Overal bed mobility: Needs Assistance Bed Mobility: Rolling Rolling: Mod assist;+2 for physical assistance         General bed mobility comments: Pt demonstrates ability to disassociate trunk reaching with UE toward handle. Required physical assist at hips to rotate trunk. Pt required +2 to scoot toward HOB. Pt had greatly increased pain with movement.  Transfers                 General transfer comment: Unable to assess this date d/t increased pain.  Ambulation/Gait             General Gait Details: Unable to assess this date d/t  increased pain.   Stairs             Wheelchair Mobility    Modified Rankin (Stroke Patients Only)       Balance                                            Cognition Arousal/Alertness: Awake/alert Behavior During Therapy: WFL for tasks assessed/performed Overall Cognitive Status: Within Functional Limits for tasks assessed                                 General Comments: Pt follows commands consistantly      Exercises Other Exercises Other Exercises: Supine: ankle pumps, glute squeezes, quad sets, isometric hip ADD, and isometric hip ABD. All ther-ex performed 10 reps with VC for technique Other Exercises: Pt educated on HEP, visual aid provided    General Comments        Pertinent Vitals/Pain Pain Assessment: 0-10 Pain Score: 8  Pain Location: R knee Pain Descriptors / Indicators: Constant;Aching;Grimacing;Guarding;Sore Pain Intervention(s): Limited activity within patient's tolerance;Monitored during session;Repositioned    Home Living Family/patient expects to be discharged to:: Skilled nursing facility                    Prior Function Level of Independence: Independent with assistive device(s)      Comments: Per pt uses rollater  for ambulation in the home and community. Does not go out in the community except for doctors appointments. Does not drive. States she has been independent with all ADL and IADL.   PT Goals (current goals can now be found in the care plan section) Acute Rehab PT Goals Patient Stated Goal: To not have pain anymore and be able to walk again. PT Goal Formulation: With patient Time For Goal Achievement: 12/17/17 Potential to Achieve Goals: Fair    Frequency    BID      PT Plan Current plan remains appropriate    Co-evaluation              AM-PAC PT "6 Clicks" Daily Activity  Outcome Measure  Difficulty turning over in bed (including adjusting bedclothes, sheets and  blankets)?: Unable Difficulty moving from lying on back to sitting on the side of the bed? : Unable Difficulty sitting down on and standing up from a chair with arms (e.g., wheelchair, bedside commode, etc,.)?: Unable Help needed moving to and from a bed to chair (including a wheelchair)?: Total Help needed walking in hospital room?: Total Help needed climbing 3-5 steps with a railing? : Total 6 Click Score: 6    End of Session   Activity Tolerance: Patient limited by pain Patient left: in bed;with call bell/phone within reach;with bed alarm set;with SCD's reapplied Nurse Communication: Mobility status PT Visit Diagnosis: Pain;Muscle weakness (generalized) (M62.81);Other abnormalities of gait and mobility (R26.89) Pain - Right/Left: Right Pain - part of body: Knee;Hip     Time: 1610-9604 PT Time Calculation (min) (ACUTE ONLY): 23 min  Charges:  $Therapeutic Exercise: 8-22 mins                     Arvilla Meres, SPT  12/03/2017, 2:12 PM

## 2017-12-03 NOTE — Progress Notes (Signed)
Chaplain responded to OR for spiritual support. Pt is not alert and seem to be in pain. Chaplain checked to see it Pt wanted to receive visit. She affirmed she did. Chaplain asked about family and she discussed her children. She named each and started to talk about her oldest. Chaplain practiced active listening and initiated family story telling. Pt received a visitor and Chaplain asked if she qwanted me to come back. She affired she did. Chaplain will return.    12/03/17 1300  Clinical Encounter Type  Visited With Patient  Visit Type Follow-up  Referral From Nurse  Spiritual Encounters  Spiritual Needs Prayer

## 2017-12-03 NOTE — Progress Notes (Signed)
PT is recommending SNF. Clinical Education officer, museum (CSW) met with patient alone at bedside and provided bed offers. Patient chose [facility]. Per Otila Kluver [facility] liaison she will start [facility] authorization today.   [employer], LCSW [phone]

## 2017-12-03 NOTE — Progress Notes (Signed)
Physical Therapy Evaluation Patient Details Name: Sandy Daniel MRN: [id number] DOB: [birth year] Today's Date: 12/03/2017   History of Present Illness  Pt was admitted for hip fx on 12/01/2017. Imaging showed comminuted impacted intertrochanteric fx of the L femur with wupreior dispacement of the distal fx fragment. Following day she had procedure of ORIF L intertrochanteric fx without complication. Pt's PMH includes CAD, COPD, seizure disorder, HLD, HTN, GERD, MI, and per pt RA, uses O2 chronically at bedtime.  Clinical Impression  Pt is a pleasant 84 year old female who was admitted for hip fx and is s/p L ORIF of intertrochanteric fx. Pt performs bed mobility with Max A . Pt is very limited by pain in her R knee and some pain in R hip, per pt pain is d/t RA flair up. On palpation knee is warm and has visible erythema and inflammation. Pt tolerated minimal participation in supine ther-ex, again d/t pain. Pt educated on continuing to practice gentle ther-ex prescribed t/o the day as pain allows. Pt demonstrates deficits with strength, balance, mobility, and activity tolerance. Pt is not at her baseline. Would benefit from skilled PT to address above deficits and promote optimal return to PLOF.     Follow Up Recommendations SNF    Equipment Recommendations  Rolling walker with 5" wheels    Recommendations for Other Services       Precautions / Restrictions Precautions Precautions: Fall Restrictions Weight Bearing Restrictions: Yes LLE Weight Bearing: Weight bearing as tolerated      Mobility  Bed Mobility Overal bed mobility: Needs Assistance Bed Mobility: Rolling Rolling: Max assist         General bed mobility comments: Pt demonstrates ability to disassociate trunk reaching with UE toward handle. Required physical assist at hips to rotate trunk. Pt had greatly increased pain with movement.  Transfers                 General transfer comment: Unable to assess this  date d/t increased pain.  Ambulation/Gait             General Gait Details: Unable to assess this date d/t increased pain.  Stairs            Wheelchair Mobility    Modified Rankin (Stroke Patients Only)       Balance                                             Pertinent Vitals/Pain Pain Assessment: 0-10 Pain Score: 10-Worst pain ever Pain Location: R knee Pain Descriptors / Indicators: Constant;Aching;Grimacing;Guarding;Sore Pain Intervention(s): Limited activity within patient's tolerance;Monitored during session;Repositioned    Home Living Family/patient expects to be discharged to:: Skilled nursing facility                      Prior Function Level of Independence: Independent with assistive device(s)         Comments: Per pt uses rollater for ambulation in the home and community. Does not go out in the community except for doctors appointments. Does not drive. States she has been independent with all ADL and IADL.     Hand Dominance   Dominant Hand: Right    Extremity/Trunk Assessment   Upper Extremity Assessment Upper Extremity Assessment: Generalized weakness(BUE: 4/5)    Lower Extremity Assessment Lower Extremity Assessment: Generalized weakness;Difficult to assess due to  impaired cognition(RLE: 3/5, LLE: 3-/5)       Communication   Communication: HOH  Cognition Arousal/Alertness: Awake/alert Behavior During Therapy: WFL for tasks assessed/performed Overall Cognitive Status: Within Functional Limits for tasks assessed                                 General Comments: Pt follows commands consistantly      General Comments      Exercises Other Exercises Other Exercises: Supine: ankle pumps and glute squeezes. All ther-ex performed 15 reps with VC for technique. Attempted heel slides and SAQ but pt's pain increased too much.   Assessment/Plan    PT Assessment Patient needs continued PT  services  PT Problem List Decreased strength;Decreased activity tolerance;Decreased balance;Decreased mobility;Pain       PT Treatment Interventions DME instruction;Gait training;Functional mobility training;Therapeutic activities;Therapeutic exercise;Balance training;Neuromuscular re-education;Patient/family education    PT Goals (Current goals can be found in the Care Plan section)  Acute Rehab PT Goals Patient Stated Goal: To not have pain anymore and be able to walk again. PT Goal Formulation: With patient Time For Goal Achievement: 12/17/17 Potential to Achieve Goals: Fair    Frequency BID   Barriers to discharge        Co-evaluation               AM-PAC PT "6 Clicks" Daily Activity  Outcome Measure Difficulty turning over in bed (including adjusting bedclothes, sheets and blankets)?: Unable Difficulty moving from lying on back to sitting on the side of the bed? : Unable Difficulty sitting down on and standing up from a chair with arms (e.g., wheelchair, bedside commode, etc,.)?: Unable Help needed moving to and from a bed to chair (including a wheelchair)?: Total Help needed walking in hospital room?: Total Help needed climbing 3-5 steps with a railing? : Total 6 Click Score: 6    End of Session   Activity Tolerance: Patient limited by pain Patient left: in bed;with call bell/phone within reach;with bed alarm set;with SCD's reapplied Nurse Communication: Mobility status PT Visit Diagnosis: Pain;Muscle weakness (generalized) (M62.81);Other abnormalities of gait and mobility (R26.89) Pain - Right/Left: Right Pain - part of body: Knee;Hip    Time: 4098-1191 PT Time Calculation (min) (ACUTE ONLY): 24 min   Charges:              Arvilla Meres, SPT  12/03/2017, 11:38 AM

## 2017-12-03 NOTE — Progress Notes (Signed)
[facility] - [facility]   PATIENT NAME: Sandy Daniel    MR#:  [id number]  DATE OF BIRTH:  May 30, 1928  SUBJECTIVE:  CHIEF COMPLAINT:   Chief Complaint  Patient presents with  . Fall   -Left hip fracture surgery done, postop day 1 today.  Complaining of severe left knee pain which is acute on chronic  REVIEW OF SYSTEMS:  Review of Systems  Constitutional: Positive for malaise/fatigue. Negative for chills and fever.  Respiratory: Negative for cough, shortness of breath and wheezing.   Cardiovascular: Negative for chest pain and palpitations.  Gastrointestinal: Negative for abdominal pain, constipation, diarrhea, nausea and vomiting.  Genitourinary: Negative for dysuria.  Musculoskeletal: Positive for joint pain and myalgias.  Neurological: Negative for dizziness, focal weakness, seizures, weakness and headaches.  Psychiatric/Behavioral: Negative for depression.    DRUG ALLERGIES:   Allergies  Allergen Reactions  . Motrin [Ibuprofen] Shortness Of Breath and Swelling    Swelling of lip/tongue  . Tolmetin Rash  . Alendronate     Other reaction(s): Nausea And Vomiting Other reaction(s): Nausea And Vomiting  . Other Rash    Any Steriods. Causes mouth sores  . Tramadol Hcl Nausea And Vomiting    VITALS:  Blood pressure 135/77, pulse 68, temperature 98.3 F (36.8 C), temperature source Oral, resp. rate 16, height 5' (1.524 m), weight 64.4 kg, SpO2 97 %.  PHYSICAL EXAMINATION:  Physical Exam  GENERAL:  84 y.o.-year-old elderly patient lying in the bed with no acute distress.  EYES: Pupils equal, round, reactive to light and accommodation. No scleral icterus. Extraocular muscles intact.  HEENT: Head atraumatic, normocephalic. Oropharynx and nasopharynx clear.  NECK:  Supple, no jugular venous distention. No thyroid enlargement, no tenderness.  LUNGS: Normal breath sounds bilaterally, no wheezing, rales,rhonchi or crepitation. No use of  accessory muscles of respiration. decreased bibasilar breath sounds CARDIOVASCULAR: S1, S2 normal. No   rubs, or gallops. 3/6 systolic murmur present ABDOMEN: Soft, nontender, nondistended. Bowel sounds present. No organomegaly or mass.  EXTREMITIES: No pedal edema, cyanosis, or clubbing.  -Left hip dressing in place, left knee is swollen and tender to touch  NEUROLOGIC: Cranial nerves II through XII are intact. Muscle strength 5/5 in all extremities except left leg due to pain. Sensation intact. Gait not checked.  PSYCHIATRIC: The patient is alert and oriented x 3.  SKIN: No obvious rash, lesion, or ulcer.    LABORATORY PANEL:   CBC Recent Labs  Lab 12/03/17 0426  WBC 6.1  HGB 10.7*  HCT 34.7*  PLT 167   ------------------------------------------------------------------------------------------------------------------  Chemistries  Recent Labs  Lab 12/01/17 1701  12/03/17 0426  NA 139   < > 137  K 3.6   < > 3.7  CL 102   < > 102  CO2 28   < > 29  GLUCOSE 95   < > 90  BUN 18   < > 15  CREATININE 0.71   < > 0.69  CALCIUM 8.2*   < > 7.9*  AST 25  --   --   ALT 16  --   --   ALKPHOS 175*  --   --   BILITOT 0.5  --   --    < > = values in this interval not displayed.   ------------------------------------------------------------------------------------------------------------------  Cardiac Enzymes Recent Labs  Lab 12/01/17 1701  TROPONINI <0.03   ------------------------------------------------------------------------------------------------------------------  RADIOLOGY:  Dg Chest 1 View  Result Date: 12/01/2017 CLINICAL DATA:  Left hip pain  due to a fall today. EXAM: CHEST  1 VIEW COMPARISON:  PA and lateral chest 10/25/2017 and 07/11/2016. CT chest 07/11/2016. FINDINGS: The lungs are emphysematous. Small area of linear scar in the left upper lobe is unchanged. There is cardiomegaly. Atherosclerosis is noted. No pneumothorax or pleural effusion. No acute bony  abnormality. Thoracolumbar scoliosis is again seen. IMPRESSION: No acute disease. Emphysema. Cardiomegaly. Atherosclerosis. Electronically Signed   By: Drusilla Kanner M.D.   On: 12/01/2017 17:22   Dg Knee Complete 4 Views Left  Result Date: 12/03/2017 CLINICAL DATA:  Larey Seat on 12/01/2017 with surgery for left femoral fracture yesterday, now with left knee pain EXAM: LEFT KNEE - COMPLETE 4+ VIEW COMPARISON:  Pelvis and left femur films of 12/01/2016, left knee films 12/15/2016 FINDINGS: The bones are somewhat osteopenic. Intramedullary nail is present for fixation of the left femoral neck fracture with no complicating features from that repair. There are tricompartmental degenerative changes of the left knee with primary loss medially. There is a moderate size left knee joint effusion present. Also changes of chondrocalcinosis are noted which may indicate CPPD arthropathy. IMPRESSION: 1. No acute fracture. 2. Tricompartmental degenerative joint disease with left knee joint effusion and chondrocalcinosis. 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 Electronically Signed   By: Dwyane Dee M.D.   On: 12/03/2017 11:38   Dg Hip Operative Unilat W Or W/o Pelvis Left  Result Date: 12/02/2017 CLINICAL DATA:  83 year old female with left intertrochanteric fracture. Subsequent encounter. EXAM: OPERATIVE left HIP (WITH PELVIS IF PERFORMED) 3 VIEWS TECHNIQUE: Fluoroscopic spot image(s) were submitted for interpretation post-operatively. Fluoroscopic time: 31 seconds. COMPARISON:  12/01/2017. FINDINGS: Three intraoperative C-arm views submitted for review after surgery. Left femoral intramedullary rod with proximal sliding type screw placed. Femoral head component contained within the femoral head. Separation of fracture fragments. IMPRESSION: Open reduction and internal fixation left intertrochanteric fracture. Electronically Signed   By: Lacy Duverney M.D.   On: 12/02/2017 14:00   Dg Hip Unilat W Or W/o Pelvis 2-3 Views  Left  Result Date: 12/01/2017 CLINICAL DATA:  Left hip pain post fall. EXAM: DG HIP (WITH OR WITHOUT PELVIS) 2-3V LEFT COMPARISON:  12/15/2016 FINDINGS: There is a comminuted impacted intertrochanteric fracture of the left femur with superior displacement of the distal fracture fragment. The left femoral head is normally located. Osteopenia. Vascular calcifications and postsurgical changes in the abdomen noted. IMPRESSION: Comminuted impacted intertrochanteric fracture of the left femur with superior displacement of the distal fracture fragment. Electronically Signed   By: Ted Mcalpine M.D.   On: 12/01/2017 17:21    EKG:   Orders placed or performed in visit on 11/18/17  . EKG 12-Lead    ASSESSMENT AND PLAN:   84 year old female with past medical history significant for CAD, COPD on home oxygen at bedtime, seizures, hypertension presents to hospital secondary to fall  1.  Fall and left hip fracture-x-ray showing left intertrochanteric fracture -Orthopedics consult appreciated.  POD #1 today - pain control, physical therapy -stable hemoglobin after surgery.    2. Left knee pain- likely secondary to severe osteoarthritis, acute on chronic - diclofenac gel or lidoderm patch -If does not improve, consider intra-articular steroid injection by orthopedics  3.  Depression and anxiety-on BuSpar and Zoloft  4.  Seizure disorder-stable.  Continue Dilantin  5.  Ongoing smoking-counseled, nicotine patch  6.  DVT prophylaxis- on Lovenox  Physical therapy consulted.  Patient ambulates with a Rollator at baseline  Will need rehab at discharge Awaiting insurance auth   All the  records are reviewed and case discussed with Care Management/Social Workerr. Management plans discussed with the patient, family and they are in agreement.  CODE STATUS: DNR  TOTAL TIME TAKING CARE OF THIS PATIENT: 36 minutes.   POSSIBLE D/C IN 1-2 DAYS, DEPENDING ON CLINICAL  CONDITION.   Enid Baas M.D on 12/03/2017 at 2:10 PM  Between 7am to 6pm - Pager - [phone]  After 6pm go to [email or web address] - Social research officer, government  [facility]  Office  [phone]  CC: Primary care physician; Galen Manila, NP

## 2017-12-03 NOTE — Progress Notes (Signed)
Subjective:  Patient reports pain as moderate.  Resting comfortably.  Objective:   VITALS:   Vitals:   12/03/17 0029 12/03/17 0142 12/03/17 0316 12/03/17 0833  BP: 112/62 (!) 119/49 136/60 135/77  Pulse: 93  91 68  Resp: 20  20 16   Temp:   (!) 100.7 F (38.2 C) 98.3 F (36.8 C)  TempSrc:   Axillary Oral  SpO2: 95%  96% 97%  Weight:      Height:        PHYSICAL EXAM:  Sensation intact distally Dorsiflexion/Plantar flexion intact Incision: dressing C/D/I  LABS  Results for orders placed or performed during the hospital encounter of 12/01/17 (from the past 24 hour(s))  CBC     Status: Abnormal   Collection Time: 12/03/17  4:26 AM  Result Value Ref Range   WBC 6.1 4.0 - 10.5 K/uL   RBC 3.70 (L) 3.87 - 5.11 MIL/uL   Hemoglobin 10.7 (L) 12.0 - 15.0 g/dL   HCT 50.9 (L) 32.6 - 71.2 %   MCV 93.8 80.0 - 100.0 fL   MCH 28.9 26.0 - 34.0 pg   MCHC 30.8 30.0 - 36.0 g/dL   RDW 45.8 (H) 09.9 - 83.3 %   Platelets 167 150 - 400 K/uL   nRBC 0.0 0.0 - 0.2 %  Basic metabolic panel     Status: Abnormal   Collection Time: 12/03/17  4:26 AM  Result Value Ref Range   Sodium 137 135 - 145 mmol/L   Potassium 3.7 3.5 - 5.1 mmol/L   Chloride 102 98 - 111 mmol/L   CO2 29 22 - 32 mmol/L   Glucose, Bld 90 70 - 99 mg/dL   BUN 15 8 - 23 mg/dL   Creatinine, Ser 8.25 0.44 - 1.00 mg/dL   Calcium 7.9 (L) 8.9 - 10.3 mg/dL   GFR calc non Af Amer >60 >60 mL/min   GFR calc Af Amer >60 >60 mL/min   Anion gap 6 5 - 15    Dg Chest 1 View  Result Date: 12/01/2017 CLINICAL DATA:  Left hip pain due to a fall today. EXAM: CHEST  1 VIEW COMPARISON:  PA and lateral chest 10/25/2017 and 07/11/2016. CT chest 07/11/2016. FINDINGS: The lungs are emphysematous. Small area of linear scar in the left upper lobe is unchanged. There is cardiomegaly. Atherosclerosis is noted. No pneumothorax or pleural effusion. No acute bony abnormality. Thoracolumbar scoliosis is again seen. IMPRESSION: No acute disease.  Emphysema. Cardiomegaly. Atherosclerosis. Electronically Signed   By: Drusilla Kanner M.D.   On: 12/01/2017 17:22   Dg Hip Operative Unilat W Or W/o Pelvis Left  Result Date: 12/02/2017 CLINICAL DATA:  84 year old female with left intertrochanteric fracture. Subsequent encounter. EXAM: OPERATIVE left HIP (WITH PELVIS IF PERFORMED) 3 VIEWS TECHNIQUE: Fluoroscopic spot image(s) were submitted for interpretation post-operatively. Fluoroscopic time: 31 seconds. COMPARISON:  12/01/2017. FINDINGS: Three intraoperative C-arm views submitted for review after surgery. Left femoral intramedullary rod with proximal sliding type screw placed. Femoral head component contained within the femoral head. Separation of fracture fragments. IMPRESSION: Open reduction and internal fixation left intertrochanteric fracture. Electronically Signed   By: Lacy Duverney M.D.   On: 12/02/2017 14:00   Dg Hip Unilat W Or W/o Pelvis 2-3 Views Left  Result Date: 12/01/2017 CLINICAL DATA:  Left hip pain post fall. EXAM: DG HIP (WITH OR WITHOUT PELVIS) 2-3V LEFT COMPARISON:  12/15/2016 FINDINGS: There is a comminuted impacted intertrochanteric fracture of the left femur with superior displacement of the distal  fracture fragment. The left femoral head is normally located. Osteopenia. Vascular calcifications and postsurgical changes in the abdomen noted. IMPRESSION: Comminuted impacted intertrochanteric fracture of the left femur with superior displacement of the distal fracture fragment. Electronically Signed   By: Ted Mcalpine M.D.   On: 12/01/2017 17:21    Assessment/Plan: 1 Day Post-Op   Active Problems:   Hip fracture (HCC)   Advance diet Up with therapy Discharge to SNF when medically stable Lovenox for DVT prophylaxis   Lyndle Herrlich , MD 12/03/2017, 8:38 AM

## 2017-12-04 LAB — CBC
HCT: 30.5 % — ABNORMAL LOW (ref 36.0–46.0)
Hemoglobin: 9.4 g/dL — ABNORMAL LOW (ref 12.0–15.0)
MCH: 28.8 pg (ref 26.0–34.0)
MCHC: 30.8 g/dL (ref 30.0–36.0)
MCV: 93.6 fL (ref 80.0–100.0)
NRBC: 0 % (ref 0.0–0.2)
PLATELETS: 172 10*3/uL (ref 150–400)
RBC: 3.26 MIL/uL — ABNORMAL LOW (ref 3.87–5.11)
RDW: 16.6 % — AB (ref 11.5–15.5)
WBC: 5.8 10*3/uL (ref 4.0–10.5)

## 2017-12-04 MED ORDER — MAGNESIUM HYDROXIDE 400 MG/5ML PO SUSP: 30.0000 mL | Freq: Two times a day (BID) | ORAL | 0 refills | Status: DC

## 2017-12-04 MED ORDER — ENOXAPARIN SODIUM 40 MG/0.4ML ~~LOC~~ SOLN: 40.0000 mg | SUBCUTANEOUS | 0 refills | Status: DC

## 2017-12-04 MED ORDER — ACETAMINOPHEN 325 MG PO TABS: 650.0000 mg | ORAL_TABLET | Freq: Four times a day (QID) | ORAL | Status: AC | PRN

## 2017-12-04 MED ORDER — LIDOCAINE 5 % EX PTCH: 1.0000 | MEDICATED_PATCH | CUTANEOUS | 0 refills | Status: DC

## 2017-12-04 MED ORDER — HYDROCODONE-ACETAMINOPHEN 5-325 MG PO TABS: 1.0000 | ORAL_TABLET | Freq: Four times a day (QID) | ORAL | 0 refills | Status: DC | PRN

## 2017-12-04 MED ORDER — MAGNESIUM HYDROXIDE 400 MG/5ML PO SUSP
30.0000 mL | Freq: Two times a day (BID) | ORAL | Status: AC
  Administered 2017-12-04: 30 mL via ORAL
  Filled 2017-12-04 (×2): qty 30

## 2017-12-04 MED ORDER — BISACODYL 10 MG RE SUPP
10.0000 mg | Freq: Every day | RECTAL | Status: DC | PRN
  Filled 2017-12-04: qty 1

## 2017-12-04 MED ORDER — POLYETHYLENE GLYCOL 3350 17 G PO PACK: 17.0000 g | PACK | Freq: Every day | ORAL | 0 refills | Status: DC

## 2017-12-04 MED ORDER — POLYETHYLENE GLYCOL 3350 17 G PO PACK
17.0000 g | PACK | Freq: Every day | ORAL | Status: DC
  Administered 2017-12-04 – 2017-12-06 (×3): 17 g via ORAL
  Filled 2017-12-04 (×3): qty 1

## 2017-12-04 MED ORDER — NICOTINE 21 MG/24HR TD PT24: 21.0000 mg | MEDICATED_PATCH | Freq: Every day | TRANSDERMAL | 0 refills | Status: DC

## 2017-12-04 NOTE — Progress Notes (Signed)
[facility] -  at [facility]   PATIENT NAME: Sandy Daniel    MR#:  [id number]  DATE OF BIRTH:  Sep 21, 1928  SUBJECTIVE:  CHIEF COMPLAINT:   Chief Complaint  Patient presents with  . Fall   -Left hip fracture surgery done, postop day 2 today.  Complaining of severe left knee pain movements which is acute on chronic  REVIEW OF SYSTEMS:  Review of Systems  Constitutional: Positive for malaise/fatigue. Negative for chills and fever.  Respiratory: Negative for cough, shortness of breath and wheezing.   Cardiovascular: Negative for chest pain and palpitations.  Gastrointestinal: Negative for abdominal pain, constipation, diarrhea, nausea and vomiting.  Genitourinary: Negative for dysuria.  Musculoskeletal: Positive for joint pain and myalgias.  Neurological: Negative for dizziness, focal weakness, seizures, weakness and headaches.  Psychiatric/Behavioral: Negative for depression.    DRUG ALLERGIES:   Allergies  Allergen Reactions  . Motrin [Ibuprofen] Shortness Of Breath and Swelling    Swelling of lip/tongue  . Tolmetin Rash  . Alendronate     Other reaction(s): Nausea And Vomiting Other reaction(s): Nausea And Vomiting  . Other Rash    Any Steriods. Causes mouth sores  . Tramadol Hcl Nausea And Vomiting    VITALS:  Blood pressure (!) 102/49, pulse 67, temperature 98.1 F (36.7 C), temperature source Oral, resp. rate 18, height 5' (1.524 m), weight 64.4 kg, SpO2 93 %.  PHYSICAL EXAMINATION:  Physical Exam  GENERAL:  84 y.o.-year-old elderly patient lying in the bed with no acute distress.  EYES: Pupils equal, round, reactive to light and accommodation. No scleral icterus. Extraocular muscles intact.  HEENT: Head atraumatic, normocephalic. Oropharynx and nasopharynx clear.  NECK:  Supple, no jugular venous distention. No thyroid enlargement, no tenderness.  LUNGS: Normal breath sounds bilaterally, no wheezing, rales,rhonchi or crepitation.  No use of accessory muscles of respiration. decreased bibasilar breath sounds CARDIOVASCULAR: S1, S2 normal. No   rubs, or gallops. 3/6 systolic murmur present ABDOMEN: Soft, nontender, nondistended. Bowel sounds present. No organomegaly or mass.  EXTREMITIES: No pedal edema, cyanosis, or clubbing.  -Left hip dressing in place, left knee is swollen and tender to touch  NEUROLOGIC: Cranial nerves II through XII are intact . left leg due to pain. Sensation intact. Gait not checked.  PSYCHIATRIC: The patient is alert and oriented x 3.  SKIN: No obvious rash, lesion, or ulcer.    LABORATORY PANEL:   CBC Recent Labs  Lab 12/04/17 0605  WBC 5.8  HGB 9.4*  HCT 30.5*  PLT 172   ------------------------------------------------------------------------------------------------------------------  Chemistries  Recent Labs  Lab 12/01/17 1701  12/03/17 0426  NA 139   < > 137  K 3.6   < > 3.7  CL 102   < > 102  CO2 28   < > 29  GLUCOSE 95   < > 90  BUN 18   < > 15  CREATININE 0.71   < > 0.69  CALCIUM 8.2*   < > 7.9*  AST 25  --   --   ALT 16  --   --   ALKPHOS 175*  --   --   BILITOT 0.5  --   --    < > = values in this interval not displayed.   ------------------------------------------------------------------------------------------------------------------  Cardiac Enzymes Recent Labs  Lab 12/01/17 1701  TROPONINI <0.03   ------------------------------------------------------------------------------------------------------------------  RADIOLOGY:  Dg Knee Complete 4 Views Left  Result Date: 12/03/2017 CLINICAL DATA:  Larey Seat on 12/01/2017 with surgery  for left femoral fracture yesterday, now with left knee pain EXAM: LEFT KNEE - COMPLETE 4+ VIEW COMPARISON:  Pelvis and left femur films of 12/01/2016, left knee films 12/15/2016 FINDINGS: The bones are somewhat osteopenic. Intramedullary nail is present for fixation of the left femoral neck fracture with no complicating features  from that repair. There are tricompartmental degenerative changes of the left knee with primary loss medially. There is a moderate size left knee joint effusion present. Also changes of chondrocalcinosis are noted which may indicate CPPD arthropathy. IMPRESSION: 1. No acute fracture. 2. Tricompartmental degenerative joint disease with left knee joint effusion and chondrocalcinosis. 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 Electronically Signed   By: Dwyane Dee M.D.   On: 12/03/2017 11:38    EKG:   Orders placed or performed in visit on 11/18/17  . EKG 12-Lead    ASSESSMENT AND PLAN:   84 year old female with past medical history significant for CAD, COPD on home oxygen at bedtime, seizures, hypertension presents to hospital secondary to fall  1.  Fall and left hip fracture-x-ray showing left intertrochanteric fracture -Orthopedics consult appreciated.  POD #2 today  okay to discharge patient from Ortho standpoint - pain control, physical therapy recommending skilled nursing facility -stable hemoglobin after surgery 10.7-9.4 -Plan is to continue Lovenox 40 mg subcu once daily for 2 weeks after discharge DVT prophylaxis  2. Left knee pain- likely secondary to severe osteoarthritis, acute on chronic - diclofenac gel or lidoderm patch -If does not improve, consider intra-articular steroid injection by orthopedics  3.  Depression and anxiety-on BuSpar and Zoloft  4.  Seizure disorder-stable.  Continue Dilantin  5.  Ongoing smoking-counseled, nicotine patch  6.  DVT prophylaxis- on Lovenox 40 mg subcu once daily for 2 weeks  Physical therapy consulted.  Patient ambulates with a Rollator at baseline  Will need rehab at discharge Awaiting insurance auth which is pending.  Social worker is following   All the records are reviewed and case discussed with Care Management/Social Workerr. Management plans discussed with the patient, family and they are in agreement.  CODE STATUS: DNR  TOTAL TIME  TAKING CARE OF THIS PATIENT: 36 minutes.   POSSIBLE D/C IN 1-2 DAYS, DEPENDING ON CLINICAL CONDITION.   Ramonita Lab M.D on 12/04/2017 at 2:26 PM  Between 7am to 6pm - Pager - [phone]  After 6pm go to [email or web address] - Social research officer, government  [facility]  Office  [phone]  CC: Primary care physician; Galen Manila, NP

## 2017-12-04 NOTE — Discharge Summary (Addendum)
[facility] [place] at [facility]   PATIENT NAME: Sandy Daniel    MR#:  [id number]  DATE OF BIRTH:  [birth year]  DATE OF ADMISSION:  12/01/2017 ADMITTING PHYSICIAN: Ihor Austin, MD  DATE OF DISCHARGE: 12/06/17  PRIMARY CARE PHYSICIAN: Galen Manila, NP    ADMISSION DIAGNOSIS:  Fall, initial encounter [phone] Closed left hip fracture, initial encounter (HCC) [S72.002A]  DISCHARGE DIAGNOSIS:  Active Problems:   Hip fracture (HCC)   SECONDARY DIAGNOSIS:   Past Medical History:  Diagnosis Date  . Arthritis   . Asthma   . B12 deficiency   . CAD (coronary artery disease)   . COPD (chronic obstructive pulmonary disease) (HCC)   . Depression   . Epilepsy (HCC)   . GERD (gastroesophageal reflux disease)   . Hyperlipidemia   . Hypertension   . Hypoxemia   . Macular degeneration (senile) of retina   . Macular degeneration of both eyes   . MI (myocardial infarction) (HCC) 09/2003, 05/2013  . Physiological tremor   . Presence of stent of CABG    x2   . Seizures [facility])     HOSPITAL COURSE:   84 year old female with past medical history significant for CAD, COPD on home oxygen at bedtime, seizures, hypertension presents to hospital secondary to fall  1.  Fall and left hip fracture-x-ray showing left intertrochanteric fracture -Orthopedics consult appreciated.    okay to discharge patient from Ortho standpoint - pain control, physical therapy recommending skilled nursing facility -stable hemoglobin after surgery 10.7-9.4 (hemodilution hemoconcentration and blood loss during surgery) continue close monitoring started on iron supplements with stool softeners -Plan is to continue Lovenox 40 mg subcu once daily for 2 weeks after discharge DVT prophylaxis -Please repeat CBC in 3 days  2. Left knee pain- likely secondary to severe osteoarthritis, acute on chronic - diclofenac gel or lidoderm patch -If does not improve, consider  intra-articular steroid injection by orthopedics Holding Coreg home medication in view of soft blood pressure  3.  Depression and anxiety-on BuSpar and Zoloft  4.  Seizure disorder-stable.  Continue Dilantin  5.  Ongoing smoking-counseled, nicotine patch  6.  DVT prophylaxis- on Lovenox 40 mg subcu once daily for 2 weeks  Physical therapy consulted.  Patient ambulates with a Rollator at baseline   DISCHARGE CONDITIONS:   Stable  CONSULTS OBTAINED:  Treatment Team:  Lyndle Herrlich, MD   PROCEDURES hip surgery  DRUG ALLERGIES:   Allergies  Allergen Reactions  . Motrin [Ibuprofen] Shortness Of Breath and Swelling    Swelling of lip/tongue  . Tolmetin Rash  . Alendronate     Other reaction(s): Nausea And Vomiting Other reaction(s): Nausea And Vomiting  . Other Rash    Any Steriods. Causes mouth sores  . Tramadol Hcl Nausea And Vomiting    DISCHARGE MEDICATIONS:   Allergies as of 12/06/2017      Reactions   Motrin [ibuprofen] Shortness Of Breath, Swelling   Swelling of lip/tongue   Tolmetin Rash   Alendronate    Other reaction(s): Nausea And Vomiting Other reaction(s): Nausea And Vomiting   Other Rash   Any Steriods. Causes mouth sores   Tramadol Hcl Nausea And Vomiting      Medication List    STOP taking these medications   carvedilol 3.125 MG tablet Commonly known as:  COREG   HYDROcodone-acetaminophen 7.5-325 MG tablet Commonly known as:  NORCO Replaced by:  HYDROcodone-acetaminophen 5-325 MG tablet     TAKE these medications  acetaminophen 325 MG tablet Commonly known as:  TYLENOL Take 2 tablets (650 mg total) by mouth every 6 (six) hours as needed for mild pain (or Fever >/= 101).   albuterol 108 (90 Base) MCG/ACT inhaler Commonly known as:  PROVENTIL HFA;VENTOLIN HFA Inhale 2 puffs into the lungs every 6 (six) hours as needed for wheezing or shortness of breath.   albuterol (2.5 MG/3ML) 0.083% nebulizer solution Commonly known as:   PROVENTIL Take 3 mLs (2.5 mg total) by nebulization every 6 (six) hours as needed for Wheezing.   aspirin 81 MG tablet Take 1 tablet (81 mg total) by mouth daily.   atorvastatin 10 MG tablet Commonly known as:  LIPITOR Take 1 tablet (10 mg total) by mouth daily.   busPIRone 10 MG tablet Commonly known as:  BUSPAR Take 1 tablet (10 mg total) by mouth 2 (two) times daily.   enoxaparin 40 MG/0.4ML injection Commonly known as:  LOVENOX Inject 0.4 mLs (40 mg total) into the skin daily.   ferrous sulfate 325 (65 FE) MG tablet Take 1 tablet (325 mg total) by mouth 2 (two) times daily with a meal.   Fluticasone-Salmeterol 100-50 MCG/DOSE Aepb Commonly known as:  ADVAIR Inhale 1 puff into the lungs 2 (two) times daily.   furosemide 20 MG tablet Commonly known as:  LASIX Take 0.5 tablets (10 mg total) by mouth daily.   gabapentin 400 MG capsule Commonly known as:  NEURONTIN Take 2 capsules (800 mg total) by mouth 3 (three) times daily.   HYDROcodone-acetaminophen 5-325 MG tablet Commonly known as:  NORCO/VICODIN Take 1-2 tablets by mouth every 6 (six) hours as needed for moderate pain. Replaces:  HYDROcodone-acetaminophen 7.5-325 MG tablet   lidocaine 5 % Commonly known as:  LIDODERM Place 1 patch onto the skin daily. Remove & Discard patch within 12 hours or as directed by MD   magnesium hydroxide 400 MG/5ML suspension Commonly known as:  MILK OF MAGNESIA Take 30 mLs by mouth 2 (two) times daily.   nicotine 21 mg/24hr patch Commonly known as:  NICODERM CQ - dosed in mg/24 hours Place 1 patch (21 mg total) onto the skin daily.   nitroGLYCERIN 0.4 MG SL tablet Commonly known as:  NITROSTAT Place 1 tablet (0.4 mg total) under the tongue every 5 (five) minutes as needed for chest pain.   pantoprazole 40 MG tablet Commonly known as:  PROTONIX Take 1 tablet (40 mg total) by mouth daily.   phenytoin 100 MG ER capsule Commonly known as:  DILANTIN TAKE 2 CAPSULES (200MG ) BY  MOUTH AT BEDTIME What changed:  See the new instructions.   polyethylene glycol packet Commonly known as:  MIRALAX / GLYCOLAX Take 17 g by mouth daily.   QUEtiapine 50 MG tablet Commonly known as:  SEROQUEL Take 1 tablet (50 mg total) by mouth at bedtime.   sertraline 100 MG tablet Commonly known as:  ZOLOFT Take 1 tablet (100 mg total) by mouth daily.        DISCHARGE INSTRUCTIONS:   Follow-up with primary care physician at the facility in 2 to 3 days Continue physical therapy as recommended by orthopedics Follow-up with orthopedics Dr. Odis Luster in 1 week   DIET:  Cardiac diet  DISCHARGE CONDITION:  Fair  ACTIVITY:  Activity as tolerated per PT   OXYGEN:  Home Oxygen: No.   Oxygen Delivery: room air  DISCHARGE LOCATION:  nursing home   If you experience worsening of your admission symptoms, develop shortness of breath, life threatening  emergency, suicidal or homicidal thoughts you must seek medical attention immediately by calling 911 or calling your MD immediately  if symptoms less severe.  You Must read complete instructions/literature along with all the possible adverse reactions/side effects for all the Medicines you take and that have been prescribed to you. Take any new Medicines after you have completely understood and accpet all the possible adverse reactions/side effects.   Please note  You were cared for by a hospitalist during your hospital stay. If you have any questions about your discharge medications or the care you received while you were in the hospital after you are discharged, you can call the unit and asked to speak with the hospitalist on call if the hospitalist that took care of you is not available. Once you are discharged, your primary care physician will handle any further medical issues. Please note that NO REFILLS for any discharge medications will be authorized once you are discharged, as it is imperative that you return to your primary care  physician (or establish a relationship with a primary care physician if you do not have one) for your aftercare needs so that they can reassess your need for medications and monitor your lab values.     Today  Chief Complaint  Patient presents with  . Fall   Patient is resting comfortably.  Denies any complaints.  Had a big bowel movement today morning  ROS:  CONSTITUTIONAL: Denies fevers, chills. Denies any fatigue, weakness.  EYES: Denies blurry vision, double vision, eye pain. EARS, NOSE, THROAT: Denies tinnitus, ear pain, hearing loss. RESPIRATORY: Denies cough, wheeze, shortness of breath.  CARDIOVASCULAR: Denies chest pain, palpitations, edema.  GASTROINTESTINAL: Denies nausea, vomiting, diarrhea, abdominal pain. Denies bright red blood per rectum. GENITOURINARY: Denies dysuria, hematuria. ENDOCRINE: Denies nocturia or thyroid problems. HEMATOLOGIC AND LYMPHATIC: Denies easy bruising or bleeding. SKIN: Denies rash or lesion. MUSCULOSKELETAL: Denies pain in neck, back, shoulder, knees.  Hip pain is manageable NEUROLOGIC: Denies paralysis, paresthesias.  PSYCHIATRIC: Denies anxiety or depressive symptoms.   VITAL SIGNS:  Blood pressure (!) 117/59, pulse 77, temperature 97.9 F (36.6 C), temperature source Oral, resp. rate 18, height 5' (1.524 m), weight 64.4 kg, SpO2 95 %.  I/O:    Intake/Output Summary (Last 24 hours) at 12/06/2017 1421 Last data filed at 12/06/2017 1414 Gross per 24 hour  Intake 600 ml  Output -  Net 600 ml    PHYSICAL EXAMINATION:  GENERAL:  84 y.o.-year-old patient lying in the bed with no acute distress.  EYES: Pupils equal, round, reactive to light and accommodation. No scleral icterus. Extraocular muscles intact.  HEENT: Head atraumatic, normocephalic. Oropharynx and nasopharynx clear.  NECK:  Supple, no jugular venous distention. No thyroid enlargement, no tenderness.  LUNGS: Normal breath sounds bilaterally, no wheezing, rales,rhonchi or  crepitation. No use of accessory muscles of respiration.  CARDIOVASCULAR: S1, S2 normal. No murmurs, rubs, or gallops.  ABDOMEN: Soft, non-tender, non-distended. Bowel sounds present. EXTREMITIES: Left hip is tender no pedal edema, cyanosis, or clubbing.  Honeycomb dressing NEUROLOGIC: Cranial nerves II through XII are intact. Sensation intact. Gait not checked.  PSYCHIATRIC: The patient is alert and oriented x 3.  SKIN: No obvious rash, lesion, or ulcer.   DATA REVIEW:   CBC Recent Labs  Lab 12/04/17 0605  WBC 5.8  HGB 9.4*  HCT 30.5*  PLT 172    Chemistries  Recent Labs  Lab 12/01/17 1701  12/03/17 0426  NA 139   < > 137  K  3.6   < > 3.7  CL 102   < > 102  CO2 28   < > 29  GLUCOSE 95   < > 90  BUN 18   < > 15  CREATININE 0.71   < > 0.69  CALCIUM 8.2*   < > 7.9*  AST 25  --   --   ALT 16  --   --   ALKPHOS 175*  --   --   BILITOT 0.5  --   --    < > = values in this interval not displayed.    Cardiac Enzymes Recent Labs  Lab 12/01/17 1701  TROPONINI <0.03    Microbiology Results  Results for orders placed or performed during the hospital encounter of 12/01/17  MRSA PCR Screening     Status: None   Collection Time: 12/02/17  3:51 AM  Result Value Ref Range Status   MRSA by PCR NEGATIVE NEGATIVE Final    Comment:        The GeneXpert MRSA Assay (FDA approved for NASAL specimens only), is one component of a comprehensive MRSA colonization surveillance program. It is not intended to diagnose MRSA infection nor to guide or monitor treatment for MRSA infections. Performed at [facility], [address] [place], [place] [address]     RADIOLOGY:  Dg Knee Complete 4 Views Left  Result Date: 12/03/2017 CLINICAL DATA:  Larey Seat on 12/01/2017 with surgery for left femoral fracture yesterday, now with left knee pain EXAM: LEFT KNEE - COMPLETE 4+ VIEW COMPARISON:  Pelvis and left femur films of 12/01/2016, left knee films 12/15/2016 FINDINGS: The  bones are somewhat osteopenic. Intramedullary nail is present for fixation of the left femoral neck fracture with no complicating features from that repair. There are tricompartmental degenerative changes of the left knee with primary loss medially. There is a moderate size left knee joint effusion present. Also changes of chondrocalcinosis are noted which may indicate CPPD arthropathy. IMPRESSION: 1. No acute fracture. 2. Tricompartmental degenerative joint disease with left knee joint effusion and chondrocalcinosis. 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 Electronically Signed   By: Dwyane Dee M.D.   On: 12/03/2017 11:38    EKG:   Orders placed or performed in visit on 11/18/17  . EKG 12-Lead      Management plans discussed with the patient, family and they are in agreement.  CODE STATUS:     Code Status Orders  (From admission, onward)         Start     Ordered   12/01/17 1839  Do not attempt resuscitation (DNR)  Continuous    Question Answer Comment  In the event of cardiac or respiratory ARREST Do not call a "code blue"   In the event of cardiac or respiratory ARREST Do not perform Intubation, CPR, defibrillation or ACLS   In the event of cardiac or respiratory ARREST Use medication by any route, position, wound care, and other measures to relive pain and suffering. May use oxygen, suction and manual treatment of airway obstruction as needed for comfort.   Comments RN may pronounce      12/01/17 1838        Code Status History    Date Active Date Inactive Code Status Order ID Comments User Context   10/22/2017 0954 10/27/2017 2328 DNR [id number]  Ramonita Lab, MD Inpatient   10/22/2017 0107 10/22/2017 0953 Full Code [id number]  Oralia Manis, MD Inpatient   09/07/2017 1354 09/08/2017 1933 Full Code [id number]  Shaune Pollack, MD Inpatient   07/11/2016 1416 07/13/2016 1824 Full Code [id number]  Milagros Loll, MD ED      TOTAL TIME TAKING CARE OF THIS PATIENT: 43  minutes.   Note: This dictation  was prepared with Dragon dictation along with smaller phrase technology. Any transcriptional errors that result from this process are unintentional.   @[facility] @  on 12/06/2017 at 2:21 PM  Between 7am to 6pm - Pager - [phone]  After 6pm go to [email or web address] - password [email or web address] [facility]  [place]  Office  [phone]  CC: Primary care physician; Galen Manila, NP

## 2017-12-04 NOTE — Discharge Instructions (Signed)
Follow-up with primary care physician at the facility in 2 to 3 days Continue physical therapy as recommended by orthopedics Follow-up with orthopedics Dr. Odis Luster in 1 week

## 2017-12-04 NOTE — Progress Notes (Signed)
Physical Therapy Treatment Patient Details Name: Sandy Daniel MRN: [id number] DOB: Mar 30, 1928 Today's Date: 12/04/2017    History of Present Illness Pt was admitted for hip fx on 12/01/2017. Imaging showed comminuted impacted intertrochanteric fx of the L femur with wupreior dispacement of the distal fx fragment. Following day she had procedure of ORIF L intertrochanteric fx without complication. Pt's PMH includes CAD, COPD, seizure disorder, HLD, HTN, GERD, MI, and per pt RA, uses O2 chronically at bedtime.    PT Comments    Pt continues to gradually progress toward goals. Pt performs increased ther-ex with minimal increase in pain, with short rest break pain back down to baseline. Pt sat on EOB today, noted posterolateral lean toward R, per pt WBing on L hip is painful. With VC to "sit tall and look out window" pt able to shift weight more toward L and maintain for short period (~45 sec). Also, progressed pt to stand today with RW and Max A, +2 physical to transfer (stand pivot) to chair. Pt on O2 1.5 L/min t/o session, stats monitored measured: baseline 90%, after there-ex 94%, after standing 79%, at end of session 95%. Pt educated on breathing techniques for relaxation and to improve O2 saturation. Will continue to progress strength/mobility/balance as able.  Follow Up Recommendations  SNF     Equipment Recommendations  Rolling walker with 5" wheels    Recommendations for Other Services       Precautions / Restrictions Precautions Precautions: Fall Restrictions Weight Bearing Restrictions: Yes LLE Weight Bearing: Weight bearing as tolerated    Mobility  Bed Mobility Overal bed mobility: Needs Assistance Bed Mobility: Supine to Sit     Supine to sit: Max assist;+2 for physical assistance;HOB elevated     General bed mobility comments: Pt continues to demonstrate ability to disassociate trunk reaching for contralateral handle on bed. Requires max A, +2 physical to manage  trunk and LLE to EOB. Has greatly inc pain in L hip and knee with movement.  Transfers Overall transfer level: Needs assistance Equipment used: Rolling walker (2 wheeled) Transfers: Stand Pivot Transfers;Sit to/from Stand Sit to Stand: Max assist;+2 physical assistance Stand pivot transfers: Max assist;+2 physical assistance       General transfer comment: Pt has poor anterior translation, but has some improvement with VC. Pt has greatly reduced WBing on LLE, and relies heavily on RUE>LUE for WB using RW.  Ambulation/Gait             General Gait Details: Unable to assess this date d/t increased pain.   Stairs             Wheelchair Mobility    Modified Rankin (Stroke Patients Only)       Balance Overall balance assessment: Needs assistance Sitting-balance support: Bilateral upper extremity supported;Feet supported Sitting balance-Leahy Scale: Poor Sitting balance - Comments: Pt has posterolateral lean toward R, could correct with VC to inc WBing on L hip, but could not tolerate > 1 minute. Relies heavy on RUE for support.                                     Cognition Arousal/Alertness: Awake/alert Behavior During Therapy: WFL for tasks assessed/performed Overall Cognitive Status: Within Functional Limits for tasks assessed  General Comments: Pt follows commands consistantly      Exercises Other Exercises Other Exercises: Supine: ankle pumps, glute sets, quad sets, SAQs, and isometric hip ADD. All ther-ex performed 10 reps with VC for technique Other Exercises: Pt educated on HEP, visual aid provided Other Exercises: Pt educated on and preformed breathing exercises for relaxation and pursed lip breathing for improved O2 sat.    General Comments        Pertinent Vitals/Pain Pain Assessment: 0-10 Pain Score: 6  Pain Location: R knee Pain Descriptors / Indicators:  Constant;Aching;Grimacing;Guarding;Sore Pain Intervention(s): Limited activity within patient's tolerance;Monitored during session;Repositioned;Relaxation    Home Living                      Prior Function            PT Goals (current goals can now be found in the care plan section) Acute Rehab PT Goals Patient Stated Goal: To not have pain anymore and be able to walk again. PT Goal Formulation: With patient Time For Goal Achievement: 12/17/17 Potential to Achieve Goals: Fair Progress towards PT goals: Progressing toward goals    Frequency    BID      PT Plan Current plan remains appropriate    Co-evaluation              AM-PAC PT "6 Clicks" Daily Activity  Outcome Measure  Difficulty turning over in bed (including adjusting bedclothes, sheets and blankets)?: Unable Difficulty moving from lying on back to sitting on the side of the bed? : Unable Difficulty sitting down on and standing up from a chair with arms (e.g., wheelchair, bedside commode, etc,.)?: Unable Help needed moving to and from a bed to chair (including a wheelchair)?: A Lot Help needed walking in hospital room?: Total Help needed climbing 3-5 steps with a railing? : Total 6 Click Score: 7    End of Session Equipment Utilized During Treatment: Gait belt Activity Tolerance: Patient limited by pain Patient left: in chair;with call bell/phone within reach;with chair alarm set Nurse Communication: Mobility status PT Visit Diagnosis: Pain;Muscle weakness (generalized) (M62.81);Other abnormalities of gait and mobility (R26.89) Pain - Right/Left: Right Pain - part of body: Knee;Hip     Time: 1610-9604 PT Time Calculation (min) (ACUTE ONLY): 53 min  Charges:                        Arvilla Meres, SPT  12/04/2017, 12:21 PM

## 2017-12-04 NOTE — Care Management Important Message (Signed)
Important Message  Patient Details  Name: Sandy Daniel MRN: [id number] Date of Birth: [birth year]   Medicare Important Message Given:  Yes    Olegario Messier A Karla Vines 12/04/2017, 12:44 PM

## 2017-12-04 NOTE — Progress Notes (Signed)
Per [facility] requested additional clinicals and [facility] sent them. [facility] authorization is still pending.   [employer], LCSW [phone]

## 2017-12-04 NOTE — Progress Notes (Signed)
Physical Therapy Treatment Patient Details Name: Sandy Daniel MRN: [id number] DOB: 1928-05-28 Today's Date: 12/04/2017    History of Present Illness Pt was admitted for hip fx on 12/01/2017. Imaging showed comminuted impacted intertrochanteric fx of the L femur with wupreior dispacement of the distal fx fragment. Following day she had procedure of ORIF L intertrochanteric fx without complication. Pt's PMH includes CAD, COPD, seizure disorder, HLD, HTN, GERD, MI, and per pt RA, uses O2 chronically at bedtime.    PT Comments    Pt is pleasant and agreeable to PT this afternoon. Per pt she is in more pain again after recent transfer from the chair to the bed. She performs ther-ex with VC for technique and tolerates increased reps, demonstrates good effort with all ther-ex. Pt refused coming to sit at EOB d/t increased pain with recent transfer. Will continue to progress pt's strength/balance/mobility as able.   Follow Up Recommendations  SNF     Equipment Recommendations  Rolling walker with 5" wheels    Recommendations for Other Services       Precautions / Restrictions Precautions Precautions: Fall Restrictions Weight Bearing Restrictions: Yes LLE Weight Bearing: Weight bearing as tolerated    Mobility  Bed Mobility               General bed mobility comments: Pt refused participating in bed mobility d/t increased pain this afternoon after recent transfer from chair to bed.  Transfers                 General transfer comment: Unable to assess this date  Ambulation/Gait             General Gait Details: Unable to assess this date   Stairs             Wheelchair Mobility    Modified Rankin (Stroke Patients Only)       Balance                                            Cognition Arousal/Alertness: Awake/alert Behavior During Therapy: WFL for tasks assessed/performed Overall Cognitive Status: Within Functional  Limits for tasks assessed                                 General Comments: Pt follows commands consistantly      Exercises Other Exercises Other Exercises: Supine: ankle pumps, glute sets, quad sets, SAQs, isometric hip ADD, and isometric hip ABD. All ther-ex performed 12 reps with VC for technique    General Comments        Pertinent Vitals/Pain Pain Assessment: 0-10 Pain Score: 8  Pain Location: R knee Pain Descriptors / Indicators: Aching;Guarding;Sore Pain Intervention(s): Limited activity within patient's tolerance;Monitored during session;Repositioned    Home Living                      Prior Function            PT Goals (current goals can now be found in the care plan section) Acute Rehab PT Goals Patient Stated Goal: To not have pain anymore and be able to walk again. PT Goal Formulation: With patient Time For Goal Achievement: 12/17/17 Potential to Achieve Goals: Fair Progress towards PT goals: Progressing toward goals    Frequency    BID  PT Plan Current plan remains appropriate    Co-evaluation              AM-PAC PT "6 Clicks" Daily Activity  Outcome Measure  Difficulty turning over in bed (including adjusting bedclothes, sheets and blankets)?: Unable Difficulty moving from lying on back to sitting on the side of the bed? : Unable Difficulty sitting down on and standing up from a chair with arms (e.g., wheelchair, bedside commode, etc,.)?: Unable Help needed moving to and from a bed to chair (including a wheelchair)?: A Lot Help needed walking in hospital room?: Total Help needed climbing 3-5 steps with a railing? : Total 6 Click Score: 7    End of Session Equipment Utilized During Treatment: Gait belt Activity Tolerance: Patient limited by pain Patient left: in chair;with call bell/phone within reach;with chair alarm set Nurse Communication: Mobility status PT Visit Diagnosis: Pain;Muscle weakness  (generalized) (M62.81);Other abnormalities of gait and mobility (R26.89) Pain - Right/Left: Right Pain - part of body: Knee;Hip     Time: 7829-5621 PT Time Calculation (min) (ACUTE ONLY): 17 min  Charges:                        Arvilla Meres, SPT  12/04/2017, 4:15 PM

## 2017-12-05 MED ORDER — MAGNESIUM HYDROXIDE 400 MG/5ML PO SUSP
30.0000 mL | Freq: Two times a day (BID) | ORAL | Status: DC
  Administered 2017-12-05 – 2017-12-06 (×3): 30 mL via ORAL
  Filled 2017-12-05 (×3): qty 30

## 2017-12-05 MED ORDER — BISACODYL 10 MG RE SUPP: 10.0000 mg | Freq: Once | RECTAL | Status: DC

## 2017-12-05 NOTE — Progress Notes (Signed)
Physical Therapy Treatment Patient Details Name: Sandy Daniel MRN: [id number] DOB: January 02, 1929 Today's Date: 12/05/2017    History of Present Illness Pt was admitted for hip fx on 12/01/2017. Imaging showed comminuted impacted intertrochanteric fx of the L femur with wupreior dispacement of the distal fx fragment. Following day she had procedure of ORIF L intertrochanteric fx without complication. Pt's PMH includes CAD, COPD, seizure disorder, HLD, HTN, GERD, MI, and per pt RA, uses O2 chronically at bedtime.    PT Comments    Pt is gradually progressing. Pt able to perform ther-ex with less VC of encouragement. She performed 3xSTS with Max A, +2 physical, requiring blocking at R knee to maintain position. Needs VC for hand placement and sequencing. Pt's stand pivot transfer improved, able to pivot on feet with minimal VC, no tactile cues required this session. She has poor eccentric control on descent to chair. Pt requires increased time for all activity. Still have difficulty controlling pain in R knee, no pain in R hip this session. Will continue to progress pt's mobility, balance, and strength as able.   Follow Up Recommendations  SNF     Equipment Recommendations  Rolling walker with 5" wheels    Recommendations for Other Services       Precautions / Restrictions Precautions Precautions: Fall Restrictions Weight Bearing Restrictions: Yes LLE Weight Bearing: Weight bearing as tolerated    Mobility  Bed Mobility Overal bed mobility: Needs Assistance Bed Mobility: Supine to Sit Rolling: Mod assist;+2 for physical assistance   Supine to sit: Max assist;+2 for physical assistance;HOB elevated     General bed mobility comments: Pt continues to demonstrate ability to disassociate trunk for rolling onto side. Still requires Max +2 to manage trunk and BLEs off edge of bed, but with Min A can scoot LEs close to EOB before requiring more assist.  Transfers Overall transfer  level: Needs assistance Equipment used: Rolling walker (2 wheeled) Transfers: Sit to/from [employer] Sit to Stand: Max assist;+2 physical assistance Stand pivot transfers: Max assist;+2 physical assistance       General transfer comment: Pt continue to have poor anterior translation, fear of falling contributes to dec ability. In standing pt's R knee buckles, PT blocked at R knee to help increase pt's tolerance to standing. Pt also continue to WB much greater in RLE not willing to accept weight in LLE.  Ambulation/Gait             General Gait Details: Unable to assess this date   Stairs             Wheelchair Mobility    Modified Rankin (Stroke Patients Only)       Balance Overall balance assessment: Needs assistance Sitting-balance support: Bilateral upper extremity supported;Feet supported Sitting balance-Leahy Scale: Poor Sitting balance - Comments: Pt has posterolateral lean toward R, could correct with VC to inc WBing on L hip, tolerated > 1 minute this session. Relies heavy on RUE for support.    Standing balance support: Bilateral upper extremity supported Standing balance-Leahy Scale: Zero Standing balance comment: Unable to stand without assist.                            Cognition Arousal/Alertness: Awake/alert Behavior During Therapy: WFL for tasks assessed/performed Overall Cognitive Status: Within Functional Limits for tasks assessed  General Comments: Pt follows commands consistantly      Exercises Other Exercises Other Exercises: Supine AROM: ankle pumps, glute sets, quad sets. AAROM: heel slides, hip ABD/ADD. All ther-ex performed 15 reps each LE, with VC for sequencing and technique.    General Comments        Pertinent Vitals/Pain Pain Assessment: 0-10 Pain Score: 4  Pain Location: R knee Pain Descriptors / Indicators: Aching;Guarding;Sore Pain  Intervention(s): Limited activity within patient's tolerance;Monitored during session;Repositioned    Home Living                      Prior Function            PT Goals (current goals can now be found in the care plan section) Acute Rehab PT Goals Patient Stated Goal: To not have pain anymore and be able to walk again. PT Goal Formulation: With patient Time For Goal Achievement: 12/17/17 Potential to Achieve Goals: Fair Progress towards PT goals: Progressing toward goals    Frequency    BID      PT Plan Current plan remains appropriate    Co-evaluation              AM-PAC PT "6 Clicks" Daily Activity  Outcome Measure  Difficulty turning over in bed (including adjusting bedclothes, sheets and blankets)?: Unable Difficulty moving from lying on back to sitting on the side of the bed? : Unable Difficulty sitting down on and standing up from a chair with arms (e.g., wheelchair, bedside commode, etc,.)?: Unable Help needed moving to and from a bed to chair (including a wheelchair)?: A Lot Help needed walking in hospital room?: Total Help needed climbing 3-5 steps with a railing? : Total 6 Click Score: 7    End of Session Equipment Utilized During Treatment: Gait belt Activity Tolerance: Patient limited by pain Patient left: in chair;with call bell/phone within reach;with chair alarm set Nurse Communication: Mobility status PT Visit Diagnosis: Pain;Muscle weakness (generalized) (M62.81);Other abnormalities of gait and mobility (R26.89) Pain - Right/Left: Right Pain - part of body: Knee;Hip     Time: 3716-9678 PT Time Calculation (min) (ACUTE ONLY): 41 min  Charges:                        Arvilla Meres, SPT 12/05/2017, 2:55 PM

## 2017-12-05 NOTE — Progress Notes (Signed)
[facility] [place] [facility]   PATIENT NAME: Sandy Daniel    MR#:  [id number]  DATE OF BIRTH:  01/15/1929  SUBJECTIVE:  CHIEF COMPLAINT:   Chief Complaint  Patient presents with  . Fall   -Left hip fracture surgery done, postop day 3 today.  Complaining of severe left knee pain movements which is acute on chronic No bowel movement yet  REVIEW OF SYSTEMS:  Review of Systems  Constitutional: Positive for malaise/fatigue. Negative for chills and fever.  Respiratory: Negative for cough, shortness of breath and wheezing.   Cardiovascular: Negative for chest pain and palpitations.  Gastrointestinal: Negative for abdominal pain, constipation, diarrhea, nausea and vomiting.  Genitourinary: Negative for dysuria.  Musculoskeletal: Positive for joint pain and myalgias.  Neurological: Negative for dizziness, focal weakness, seizures, weakness and headaches.  Psychiatric/Behavioral: Negative for depression.    DRUG ALLERGIES:   Allergies  Allergen Reactions  . Motrin [Ibuprofen] Shortness Of Breath and Swelling    Swelling of lip/tongue  . Tolmetin Rash  . Alendronate     Other reaction(s): Nausea And Vomiting Other reaction(s): Nausea And Vomiting  . Other Rash    Any Steriods. Causes mouth sores  . Tramadol Hcl Nausea And Vomiting    VITALS:  Blood pressure (!) 109/51, pulse 72, temperature 98.2 F (36.8 C), temperature source Oral, resp. rate 18, height 5' (1.524 m), weight 64.4 kg, SpO2 95 %.  PHYSICAL EXAMINATION:  Physical Exam  GENERAL:  84 y.o.-year-old elderly patient lying in the bed with no acute distress.  EYES: Pupils equal, round, reactive to light and accommodation. No scleral icterus. Extraocular muscles intact.  HEENT: Head atraumatic, normocephalic. Oropharynx and nasopharynx clear.  NECK:  Supple, no jugular venous distention. No thyroid enlargement, no tenderness.  LUNGS: Normal breath sounds bilaterally, no wheezing,  rales,rhonchi or crepitation. No use of accessory muscles of respiration. decreased bibasilar breath sounds CARDIOVASCULAR: S1, S2 normal. No   rubs, or gallops. 3/6 systolic murmur present ABDOMEN: Soft, nontender, nondistended. Bowel sounds present. No organomegaly or mass.  EXTREMITIES: No pedal edema, cyanosis, or clubbing.  -Left hip dressing in place, left knee is swollen and tender to touch  NEUROLOGIC: Cranial nerves II through XII are intact . left leg due to pain. Sensation intact. Gait not checked.  PSYCHIATRIC: The patient is alert and oriented x 3.  SKIN: No obvious rash, lesion, or ulcer.    LABORATORY PANEL:   CBC Recent Labs  Lab 12/04/17 0605  WBC 5.8  HGB 9.4*  HCT 30.5*  PLT 172   ------------------------------------------------------------------------------------------------------------------  Chemistries  Recent Labs  Lab 12/01/17 1701  12/03/17 0426  NA 139   < > 137  K 3.6   < > 3.7  CL 102   < > 102  CO2 28   < > 29  GLUCOSE 95   < > 90  BUN 18   < > 15  CREATININE 0.71   < > 0.69  CALCIUM 8.2*   < > 7.9*  AST 25  --   --   ALT 16  --   --   ALKPHOS 175*  --   --   BILITOT 0.5  --   --    < > = values in this interval not displayed.   ------------------------------------------------------------------------------------------------------------------  Cardiac Enzymes Recent Labs  Lab 12/01/17 1701  TROPONINI <0.03   ------------------------------------------------------------------------------------------------------------------  RADIOLOGY:  No results found.  EKG:   Orders placed or performed in visit on  11/18/17  . EKG 12-Lead    ASSESSMENT AND PLAN:   84 year old female with past medical history significant for CAD, COPD on home oxygen at bedtime, seizures, hypertension presents to hospital secondary to fall  1.  Fall and left hip fracture-x-ray showing left intertrochanteric fracture -Orthopedics consult appreciated.  POD #3  today  okay to discharge patient from Ortho standpoint - pain control with pain medications as needed - physical therapy recommending skilled nursing facility -stable hemoglobin after surgery 10.7-9.4 -Plan is to continue Lovenox 40 mg subcu once daily for 2 weeks after discharge DVT prophylaxis  2. Left knee pain- likely secondary to severe osteoarthritis, acute on chronic - diclofenac gel or lidoderm patch -If does not improve, consider intra-articular steroid injection by orthopedics  3.  Depression and anxiety-on BuSpar and Zoloft  4.  Seizure disorder-stable.  Continue Dilantin  5.  Ongoing smoking-counseled, nicotine patch  6.  Constipation-stool softeners, rectal suppository and enema as needed  DVT prophylaxis- on Lovenox 40 mg subcu once daily for 2 weeks  Physical therapy consulted.  Patient ambulates with a Rollator at baseline  Will need rehab at discharge Awaiting insurance auth which is pending.  Social worker is following   All the records are reviewed and case discussed with Care Management/Social Workerr. Management plans discussed with the patient, family and they are in agreement.  CODE STATUS: DNR  TOTAL TIME TAKING CARE OF THIS PATIENT: 36 minutes.   POSSIBLE D/C IN 1-2 DAYS, DEPENDING ON CLINICAL CONDITION.   Ramonita Lab M.D on 12/05/2017 at 11:47 AM  Between 7am to 6pm - Pager - [phone]  After 6pm go to [email or web address] - Social research officer, government  [facility]  Office  [phone]  CC: Primary care physician; Galen Manila, NP

## 2017-12-05 NOTE — Telephone Encounter (Signed)
Order signed by Dr. Althea Charon and faxed over.

## 2017-12-05 NOTE — Progress Notes (Signed)
Physical Therapy Treatment Patient Details Name: Sandy Daniel MRN: [id number] DOB: 1928-10-05 Today's Date: 12/05/2017    History of Present Illness Pt was admitted for hip fx on 12/01/2017. Imaging showed comminuted impacted intertrochanteric fx of the L femur with wupreior dispacement of the distal fx fragment. Following day she had procedure of ORIF L intertrochanteric fx without complication. Pt's PMH includes CAD, COPD, seizure disorder, HLD, HTN, GERD, MI, and per pt RA, uses O2 chronically at bedtime.    PT Comments    Pt continues to gradually progress toward goals. This afternoon has improved standing tolerating standing with RW for UE support and Max A, +2 physical with R knee blocked for > 2 minutes. She is very motivated and excited to be standing with less pain this afternoon. During the stand-pivot transfer pt did not require VC to pivot feet. Pt continues to have great pain during bed mobility. Will continue to progress pt's mobility and strength as able.  Follow Up Recommendations  SNF     Equipment Recommendations  Rolling walker with 5" wheels    Recommendations for Other Services       Precautions / Restrictions Precautions Precautions: Fall Restrictions Weight Bearing Restrictions: Yes LLE Weight Bearing: Weight bearing as tolerated    Mobility  Bed Mobility Overal bed mobility: Needs Assistance Bed Mobility: Sit to Supine Rolling: Mod assist;+2 for physical assistance   Supine to sit: Max assist;+2 for physical assistance;HOB elevated Sit to supine: Mod assist;+2 for physical assistance   General bed mobility comments: Pt able to manage RLE onto bed indep, but requires Mod A to manage trunk and LLE onto bed.  Transfers Overall transfer level: Needs assistance Equipment used: Rolling walker (2 wheeled) Transfers: Sit to/from [employer] Sit to Stand: Max assist;+2 physical assistance Stand pivot transfers: Max assist;+2 physical  assistance       General transfer comment: Pt has improved anterior translation with VC, continues to be fearful of putting weight through LLE. During stand pivot transfer pt able to pivot feet without VC's.  Ambulation/Gait             General Gait Details: Unable to assess this date   Stairs             Wheelchair Mobility    Modified Rankin (Stroke Patients Only)       Balance Overall balance assessment: Needs assistance Sitting-balance support: Bilateral upper extremity supported;Feet supported Sitting balance-Leahy Scale: Fair Sitting balance - Comments: Pt continues to have posterolateral lean toward R, trying to reduce WBing in R hip. With VC can correct and maintain for longer period this session > 2 minutes.   Standing balance support: Bilateral upper extremity supported Standing balance-Leahy Scale: Zero Standing balance comment: Unable to stand without assist.                            Cognition Arousal/Alertness: Awake/alert Behavior During Therapy: WFL for tasks assessed/performed Overall Cognitive Status: Within Functional Limits for tasks assessed                                 General Comments: Pt follows commands consistantly      Exercises Other Exercises Other Exercises: Seated AROM BLE: ankle pumps, glutes sets. Supine AAROM LLE: SLR, hip ABD/ADD. All ther-ex performed 15 reps with VC for technique and sequencing Other Exercises: Pt educated on continuing HEP  for increasing strength. Other Exercises: Pt performed pursed lip breathing to improve O2 saturation.    General Comments        Pertinent Vitals/Pain Pain Assessment: 0-10 Pain Score: 5  Pain Location: R knee Pain Descriptors / Indicators: Aching;Guarding;Sore Pain Intervention(s): Limited activity within patient's tolerance;Monitored during session;Repositioned    Home Living                      Prior Function            PT Goals  (current goals can now be found in the care plan section) Acute Rehab PT Goals Patient Stated Goal: To not have pain anymore and be able to walk again. PT Goal Formulation: With patient Time For Goal Achievement: 12/17/17 Potential to Achieve Goals: Fair Progress towards PT goals: Progressing toward goals    Frequency    BID      PT Plan Current plan remains appropriate    Co-evaluation              AM-PAC PT "6 Clicks" Daily Activity  Outcome Measure  Difficulty turning over in bed (including adjusting bedclothes, sheets and blankets)?: Unable Difficulty moving from lying on back to sitting on the side of the bed? : Unable Difficulty sitting down on and standing up from a chair with arms (e.g., wheelchair, bedside commode, etc,.)?: Unable Help needed moving to and from a bed to chair (including a wheelchair)?: A Lot Help needed walking in hospital room?: Total Help needed climbing 3-5 steps with a railing? : Total 6 Click Score: 7    End of Session Equipment Utilized During Treatment: Gait belt Activity Tolerance: Patient limited by pain Patient left: in bed;with call bell/phone within reach;with bed alarm set Nurse Communication: Mobility status PT Visit Diagnosis: Pain;Muscle weakness (generalized) (M62.81);Other abnormalities of gait and mobility (R26.89) Pain - Right/Left: Right Pain - part of body: Knee;Hip     Time: 1325-1403 PT Time Calculation (min) (ACUTE ONLY): 38 min  Charges:                        Arvilla Meres, SPT  12/05/2017, 5:06 PM

## 2017-12-05 NOTE — Progress Notes (Signed)
Per [facility] liaison [employer] [facility] authorization is still pending.   Matthieu Loftus, LCSW [phone]   

## 2017-12-05 NOTE — Progress Notes (Signed)
Patient refusing enema and suppository- states that Milk of mag makes her have a bowel movement. Dr. Amado Coe paged and made aware.

## 2017-12-06 MED ORDER — FERROUS SULFATE 325 (65 FE) MG PO TABS: 325.0000 mg | ORAL_TABLET | Freq: Two times a day (BID) | ORAL | 3 refills | Status: DC

## 2017-12-06 MED ORDER — FERROUS SULFATE 325 (65 FE) MG PO TABS: 325.0000 mg | ORAL_TABLET | Freq: Two times a day (BID) | ORAL | Status: DC

## 2017-12-06 MED ORDER — MAGNESIUM HYDROXIDE 400 MG/5ML PO SUSP: 30.0000 mL | Freq: Two times a day (BID) | ORAL | 0 refills | Status: DC

## 2017-12-06 NOTE — Progress Notes (Signed)
Per MD order Milk of Mag. Given at 2145 per pt request.

## 2017-12-06 NOTE — Progress Notes (Signed)
Physical Therapy Treatment Patient Details Name: Sandy Daniel MRN: [id number] DOB: [birth year] Today's Date: 12/06/2017    History of Present Illness Pt was admitted for hip fx on 12/01/2017. Imaging showed comminuted impacted intertrochanteric fx of the L femur with wupreior dispacement of the distal fx fragment. Following day she had procedure of ORIF L intertrochanteric fx without complication. Pt's PMH includes CAD, COPD, seizure disorder, HLD, HTN, GERD, MI, and per pt RA, uses O2 chronically at bedtime.    PT Comments    Pt is continuing to progress toward her goals. She is now able to manage RLE indepentently and requires Min A with LLE during bed mobility, and physical assist at trunk to come to EOB. Once at EOB can sit with supervision at EOB for extended duration. Pt performs sit-to-stand with Max A +2, demonstrating improved WBing in LLE this afternoon (slight dec in R lateral shift). She continues to perform ther-ex with supervision-Min A. Pt is still painful with LLE movement, but demonstrates improved tolerance to pain. Will continue to progress pt's strength and mobility as able.   Follow Up Recommendations  SNF     Equipment Recommendations  Rolling walker with 5" wheels    Recommendations for Other Services       Precautions / Restrictions Precautions Precautions: Fall Restrictions Weight Bearing Restrictions: Yes LLE Weight Bearing: Weight bearing as tolerated    Mobility  Bed Mobility Overal bed mobility: Needs Assistance Bed Mobility: Supine to Sit;Sit to Supine     Supine to sit: Mod assist;+2 for physical assistance Sit to supine: Min assist   General bed mobility comments: Pt manages RLE independently with all bed mobility. Requires Mod A, +2 for trunk mgt to EOB, and LLE off EOB. Returning to supine only requires Min A for LLE mgt. Needs VC for sequencing.  Transfers Overall transfer level: Needs assistance Equipment used: Rolling walker (2  wheeled) Transfers: Sit to/from Stand Sit to Stand: Max assist;+2 physical assistance Stand pivot transfers: Max assist;+2 physical assistance       General transfer comment: Fair anterior translation. Did not require R knee blocking to maintain standing this date. Able to increase WBing in LLE this session.  Ambulation/Gait             General Gait Details: Unable to assess this date   Stairs             Wheelchair Mobility    Modified Rankin (Stroke Patients Only)       Balance Overall balance assessment: Needs assistance Sitting-balance support: Bilateral upper extremity supported;Feet supported Sitting balance-Leahy Scale: Fair Sitting balance - Comments: Once at EOB requires no assist to maintain upright posturing, uses BUEs for support at EOB, noted kyphotic posturing.   Standing balance support: Bilateral upper extremity supported Standing balance-Leahy Scale: Poor Standing balance comment: Pt improved ability to stand with Mod A and RW for UE support. Stands ~2 minutes                            Cognition Arousal/Alertness: Awake/alert Behavior During Therapy: WFL for tasks assessed/performed Overall Cognitive Status: Within Functional Limits for tasks assessed                                 General Comments: Pt follows commands consistantly      Exercises Other Exercises Other Exercises: Supine AROM BLE: ankle pumps, glute  sets. Barbaraann Boys (Min A): SLRs, hip ABD/ADD. Other Exercises: Performed breathing technique for relaxation. Other Exercises: Toileting activities at [facility] performed. Requires Max A +2 physical assist.     General Comments        Pertinent Vitals/Pain Pain Assessment: 0-10 Pain Score: 3  Pain Location: R knee, with movement R hip Pain Descriptors / Indicators: Aching;Guarding;Sore Pain Intervention(s): Limited activity within patient's tolerance    Home Living                      Prior  Function            PT Goals (current goals can now be found in the care plan section) Acute Rehab PT Goals Patient Stated Goal: To not have pain anymore and be able to walk again. PT Goal Formulation: With patient Time For Goal Achievement: 12/17/17 Potential to Achieve Goals: Fair Progress towards PT goals: Progressing toward goals    Frequency    BID      PT Plan Current plan remains appropriate    Co-evaluation              AM-PAC PT "6 Clicks" Daily Activity  Outcome Measure  Difficulty turning over in bed (including adjusting bedclothes, sheets and blankets)?: Unable Difficulty moving from lying on back to sitting on the side of the bed? : Unable Difficulty sitting down on and standing up from a chair with arms (e.g., wheelchair, bedside commode, etc,.)?: Unable Help needed moving to and from a bed to chair (including a wheelchair)?: A Lot Help needed walking in hospital room?: Total Help needed climbing 3-5 steps with a railing? : Total 6 Click Score: 7    End of Session Equipment Utilized During Treatment: Gait belt Activity Tolerance: Patient limited by pain Patient left: in bed;with call bell/phone within reach;with bed alarm set Nurse Communication: Mobility status PT Visit Diagnosis: Pain;Muscle weakness (generalized) (M62.81);Other abnormalities of gait and mobility (R26.89) Pain - Right/Left: Right Pain - part of body: Knee;Hip     Time: 1311-1340 PT Time Calculation (min) (ACUTE ONLY): 29 min  Charges:  $Therapeutic Exercise: 8-22 mins $Therapeutic Activity: 8-22 mins                     Arvilla Meres, SPT  12/06/2017, 1:58 PM

## 2017-12-06 NOTE — Progress Notes (Signed)
Per Dr. Amado Coe she completed the peer to peer with the [facility] medical director and SNF was approved. Patient is medically stable for D/C to [facility] today. Per [facility] liaison patient can come to [facility] today to room 501. RN will call report and arrange EMS for transport. Clinical Child psychotherapist (CSW) sent D/C orders to [facility] via [facility]. Patient is aware of above. CSW left J[facility] owner a voicemail making her aware of and requested for her to bring clothes to [facility] for the patient. Please reconsult if future social work needs arise. CSW signing off.   [employer], LCSW [phone]

## 2017-12-06 NOTE — Progress Notes (Signed)
Pt ready for discharge to [facility]  Resources, report called to Casimiro Needle, Charity fundraiser.  EMS contacted for transport.

## 2017-12-06 NOTE — Care Management Important Message (Signed)
Important Message  Patient Details  Name: Sandy Daniel MRN: [id number] Date of Birth: 08/25/1928   Medicare Important Message Given:  Yes    Olegario Messier A Ramal Eckhardt 12/06/2017, 10:47 AM

## 2017-12-06 NOTE — Clinical Social Work Placement (Signed)
   CLINICAL SOCIAL WORK PLACEMENT  NOTE  Date:  12/06/2017  Patient Details  Name: Sandy Daniel MRN: [id number] Date of Birth: [birth year]  Clinical Social Work is seeking post-discharge placement for this patient at the Skilled  Nursing Facility level of care (*CSW will initial, date and re-position this form in  chart as items are completed):  Yes   Patient/family provided with [facility] Clinical Social Work Department's list of facilities offering this level of care within the geographic area requested by the patient (or if unable, by the patient's family).  Yes   Patient/family informed of their freedom to choose among providers that offer the needed level of care, that participate in Medicare, Medicaid or managed care program needed by the patient, have an available bed and are willing to accept the patient.  Yes   Patient/family informed of [facility]'s ownership interest in [facility] and [facility], as well as of the fact that they are under no obligation to receive care at these facilities.  PASRR submitted to EDS on       PASRR number received on       Existing PASRR number confirmed on 12/02/17     FL2 transmitted to all facilities in geographic area requested by pt/family on 12/02/17     FL2 transmitted to all facilities within larger geographic area on       Patient informed that his/her managed care company has contracts with or will negotiate with certain facilities, including the following:        Yes   Patient/family informed of bed offers received.  Patient chooses bed at ([facility] )     Physician recommends and patient chooses bed at      Patient to be transferred to ([facility] ) on 12/06/17.  Patient to be transferred to facility by [facility] )     Patient family notified on 12/06/17 of transfer.  Name of family member notified:  (CSW left [facility] care [facility] owner a voicemail making her aware of D/C today. )      PHYSICIAN       Additional Comment:    _______________________________________________ Kymberly Blomberg, [name], LCSW 12/06/2017, 2:28 PM

## 2017-12-06 NOTE — Progress Notes (Signed)
Physical Therapy Treatment Patient Details Name: Sandy Daniel MRN: [id number] DOB: 1928/04/03 Today's Date: 12/06/2017    History of Present Illness Pt was admitted for hip fx on 12/01/2017. Imaging showed comminuted impacted intertrochanteric fx of the L femur with wupreior dispacement of the distal fx fragment. Following day she had procedure of ORIF L intertrochanteric fx without complication. Pt's PMH includes CAD, COPD, seizure disorder, HLD, HTN, GERD, MI, and per pt RA, uses O2 chronically at bedtime.    PT Comments    Pt continues to gradually progress toward goals. Today's treatment focused on functional mobility activities d/t need to use BSC. Pt demonstrated improved ability to stand no longer requiring blocking at R knee to perform stand-pivot transfer. After BM pt showed improved tolerance standing a longer duration with Max A, +2 physical, standing for >2 minutes for hygiene activities. Pt then transferred to chair. Will continue to progress mobility and strength as able.   Follow Up Recommendations  SNF     Equipment Recommendations  Rolling walker with 5" wheels    Recommendations for Other Services       Precautions / Restrictions Precautions Precautions: Fall Restrictions Weight Bearing Restrictions: Yes LLE Weight Bearing: Weight bearing as tolerated    Mobility  Bed Mobility Overal bed mobility: Needs Assistance Bed Mobility: Supine to Sit     Supine to sit: Max assist;+2 for physical assistance;HOB elevated     General bed mobility comments: Pt continues to improve ability to manage LEs on top of bed, still requires assist to mange LEs off EOB and trunk to come to sit.  Transfers Overall transfer level: Needs assistance Equipment used: Rolling walker (2 wheeled) Transfers: Sit to/from [employer] Sit to Stand: Max assist;+2 physical assistance Stand pivot transfers: Max assist;+2 physical assistance       General transfer  comment: Fair anterior translation. Did not require R knee blocking to maintain standing this date.   Ambulation/Gait             General Gait Details: Unable to assess this date   Stairs             Wheelchair Mobility    Modified Rankin (Stroke Patients Only)       Balance Overall balance assessment: Needs assistance Sitting-balance support: Bilateral upper extremity supported;Feet supported Sitting balance-Leahy Scale: Fair Sitting balance - Comments: Pt continues to have posterolateral lean toward R, trying to reduce WBing in R hip. With VC can correct and maintain for longer period this session > 2 minutes.   Standing balance support: Bilateral upper extremity supported Standing balance-Leahy Scale: Zero Standing balance comment: Unable to stand without assist.                            Cognition Arousal/Alertness: Awake/alert Behavior During Therapy: WFL for tasks assessed/performed Overall Cognitive Status: Within Functional Limits for tasks assessed                                 General Comments: Pt follows commands consistantly      Exercises Other Exercises Other Exercises: Toileting activities at [facility] performed. Requires Max A +2 physical assist.     General Comments        Pertinent Vitals/Pain Pain Assessment: 0-10 Pain Score: 4  Pain Location: R knee Pain Descriptors / Indicators: Aching;Guarding;Sore Pain Intervention(s): Limited activity within patient's tolerance  Home Living                      Prior Function            PT Goals (current goals can now be found in the care plan section) Acute Rehab PT Goals Patient Stated Goal: To not have pain anymore and be able to walk again. PT Goal Formulation: With patient Time For Goal Achievement: 12/17/17 Potential to Achieve Goals: Fair Progress towards PT goals: Progressing toward goals    Frequency    BID      PT Plan Current plan  remains appropriate    Co-evaluation              AM-PAC PT "6 Clicks" Daily Activity  Outcome Measure  Difficulty turning over in bed (including adjusting bedclothes, sheets and blankets)?: Unable Difficulty moving from lying on back to sitting on the side of the bed? : Unable Difficulty sitting down on and standing up from a chair with arms (e.g., wheelchair, bedside commode, etc,.)?: Unable Help needed moving to and from a bed to chair (including a wheelchair)?: A Lot Help needed walking in hospital room?: Total Help needed climbing 3-5 steps with a railing? : Total 6 Click Score: 7    End of Session Equipment Utilized During Treatment: Gait belt Activity Tolerance: Patient limited by pain Patient left: with call bell/phone within reach;in chair;with chair alarm set Nurse Communication: Mobility status PT Visit Diagnosis: Pain;Muscle weakness (generalized) (M62.81);Other abnormalities of gait and mobility (R26.89) Pain - Right/Left: Right Pain - part of body: Knee;Hip     Time: 1610-9604 PT Time Calculation (min) (ACUTE ONLY): 31 min  Charges:                        Arvilla Meres, SPT  12/06/2017, 12:06 PM

## 2017-12-06 NOTE — Progress Notes (Addendum)
Per Inetta Fermo [facility] liaison Monia Pouch has denied SNF because 1) patient is unstable for transfer, 2) patient's pain is not managed, 3) patient is not participating in PT. Dr. Amado Coe is aware of above and will complete a peer to peer by 4 pm today. Case reference # [id number], phone # [phone]. Patient is aware of above.   [employer], LCSW [phone]

## 2017-12-06 NOTE — Plan of Care (Signed)

## 2017-12-16 ENCOUNTER — Telehealth: Payer: Self-pay | Admitting: Nurse Practitioner

## 2017-12-16 NOTE — Telephone Encounter (Signed)
Called to schedule Medicare Annual Wellness Visit with the Nurse Health Advisor.   If patient returns call, please note: their last AWV was on 01/08/17 please schedule AWV with [facility] any date AFTER 01/08/2018  Thank you! For any questions please contact: Manuela Schwartz [phone] or Skype at: [email or web address]

## 2017-12-17 ENCOUNTER — Encounter: Payer: Medicare HMO | Admitting: Student in an Organized Health Care Education/Training Program

## 2017-12-20 ENCOUNTER — Ambulatory Visit: Payer: Self-pay | Admitting: Nurse Practitioner

## 2018-01-03 ENCOUNTER — Encounter: Payer: Self-pay | Admitting: Nurse Practitioner

## 2018-01-20 ENCOUNTER — Other Ambulatory Visit: Payer: Self-pay

## 2018-01-20 ENCOUNTER — Emergency Department
Admission: EM | Admit: 2018-01-20 | Discharge: 2018-01-21 | Disposition: A | Discharge status: Other Acute Inpatient Hospital | Payer: Medicare HMO | Attending: Emergency Medicine | Admitting: Emergency Medicine

## 2018-01-20 ENCOUNTER — Ambulatory Visit: Payer: Self-pay | Admitting: Nurse Practitioner

## 2018-01-20 DIAGNOSIS — F331 Major depressive disorder, recurrent, moderate: Secondary | ICD-10-CM | POA: Diagnosis not present

## 2018-01-20 DIAGNOSIS — Z79899 Other long term (current) drug therapy: Secondary | ICD-10-CM | POA: Diagnosis not present

## 2018-01-20 DIAGNOSIS — Z7901 Long term (current) use of anticoagulants: Secondary | ICD-10-CM | POA: Insufficient documentation

## 2018-01-20 DIAGNOSIS — I259 Chronic ischemic heart disease, unspecified: Secondary | ICD-10-CM | POA: Insufficient documentation

## 2018-01-20 DIAGNOSIS — Z7982 Long term (current) use of aspirin: Secondary | ICD-10-CM | POA: Diagnosis not present

## 2018-01-20 DIAGNOSIS — I10 Essential (primary) hypertension: Secondary | ICD-10-CM | POA: Insufficient documentation

## 2018-01-20 DIAGNOSIS — F17210 Nicotine dependence, cigarettes, uncomplicated: Secondary | ICD-10-CM | POA: Insufficient documentation

## 2018-01-20 DIAGNOSIS — R45851 Suicidal ideations: Secondary | ICD-10-CM | POA: Insufficient documentation

## 2018-01-20 DIAGNOSIS — N390 Urinary tract infection, site not specified: Secondary | ICD-10-CM | POA: Insufficient documentation

## 2018-01-20 DIAGNOSIS — J449 Chronic obstructive pulmonary disease, unspecified: Secondary | ICD-10-CM | POA: Diagnosis not present

## 2018-01-20 DIAGNOSIS — Z046 Encounter for general psychiatric examination, requested by authority: Secondary | ICD-10-CM | POA: Diagnosis present

## 2018-01-20 LAB — CBC
HCT: 41.7 % (ref 36.0–46.0)
HEMOGLOBIN: 12.7 g/dL (ref 12.0–15.0)
MCH: 30.5 pg (ref 26.0–34.0)
MCHC: 30.5 g/dL (ref 30.0–36.0)
MCV: 100 fL (ref 80.0–100.0)
PLATELETS: 308 10*3/uL (ref 150–400)
RBC: 4.17 MIL/uL (ref 3.87–5.11)
RDW: 17.2 % — ABNORMAL HIGH (ref 11.5–15.5)
WBC: 6.1 10*3/uL (ref 4.0–10.5)
nRBC: 0 % (ref 0.0–0.2)

## 2018-01-20 LAB — COMPREHENSIVE METABOLIC PANEL
ALBUMIN: 3.5 g/dL (ref 3.5–5.0)
ALT: 11 U/L (ref 0–44)
ANION GAP: 6 (ref 5–15)
AST: 17 U/L (ref 15–41)
Alkaline Phosphatase: 157 U/L — ABNORMAL HIGH (ref 38–126)
BUN: 19 mg/dL (ref 8–23)
CHLORIDE: 105 mmol/L (ref 98–111)
CO2: 28 mmol/L (ref 22–32)
Calcium: 8.9 mg/dL (ref 8.9–10.3)
Creatinine, Ser: 0.81 mg/dL (ref 0.44–1.00)
GFR calc Af Amer: >60 mL/min (ref >60)
GFR calc non Af Amer: >60 mL/min (ref >60)
GLUCOSE: 101 mg/dL — AB (ref 70–99)
POTASSIUM: 4.1 mmol/L (ref 3.5–5.1)
SODIUM: 139 mmol/L (ref 135–145)
Total Bilirubin: 0.5 mg/dL (ref 0.3–1.2)
Total Protein: 7.2 g/dL (ref 6.5–8.1)

## 2018-01-20 LAB — SALICYLATE LEVEL

## 2018-01-20 LAB — ACETAMINOPHEN LEVEL

## 2018-01-20 LAB — ETHANOL: Alcohol, Ethyl (B): <10 mg/dL (ref <10)

## 2018-01-20 LAB — TROPONIN I

## 2018-01-20 MED ORDER — PANTOPRAZOLE SODIUM 40 MG PO TBEC
40.0000 mg | DELAYED_RELEASE_TABLET | Freq: Every day | ORAL | Status: DC
  Administered 2018-01-21: 40 mg via ORAL
  Filled 2018-01-20: qty 1

## 2018-01-20 MED ORDER — ALBUTEROL SULFATE HFA 108 (90 BASE) MCG/ACT IN AERS
2.0000 | INHALATION_SPRAY | Freq: Four times a day (QID) | RESPIRATORY_TRACT | Status: DC | PRN
  Filled 2018-01-20: qty 6.7

## 2018-01-20 MED ORDER — FERROUS SULFATE 325 (65 FE) MG PO TABS
325.0000 mg | ORAL_TABLET | Freq: Two times a day (BID) | ORAL | Status: DC
  Administered 2018-01-21: 325 mg via ORAL
  Filled 2018-01-20 (×2): qty 1

## 2018-01-20 MED ORDER — SERTRALINE HCL 50 MG PO TABS
100.0000 mg | ORAL_TABLET | Freq: Every day | ORAL | Status: DC
  Administered 2018-01-21: 100 mg via ORAL
  Filled 2018-01-20: qty 2

## 2018-01-20 MED ORDER — MOMETASONE FURO-FORMOTEROL FUM 100-5 MCG/ACT IN AERO
2.0000 | INHALATION_SPRAY | Freq: Two times a day (BID) | RESPIRATORY_TRACT | Status: DC
  Administered 2018-01-21: 2 via RESPIRATORY_TRACT
  Filled 2018-01-20 (×2): qty 8.8

## 2018-01-20 MED ORDER — ALBUTEROL SULFATE (2.5 MG/3ML) 0.083% IN NEBU: 2.5000 mg | INHALATION_SOLUTION | RESPIRATORY_TRACT | Status: DC | PRN

## 2018-01-20 MED ORDER — BUSPIRONE HCL 5 MG PO TABS
10.0000 mg | ORAL_TABLET | Freq: Two times a day (BID) | ORAL | Status: DC
  Administered 2018-01-21 (×2): 10 mg via ORAL
  Filled 2018-01-20 (×2): qty 2

## 2018-01-20 MED ORDER — PHENYTOIN SODIUM EXTENDED 100 MG PO CAPS
200.0000 mg | ORAL_CAPSULE | Freq: Every day | ORAL | Status: DC
  Administered 2018-01-21: 200 mg via ORAL
  Filled 2018-01-20: qty 2

## 2018-01-20 MED ORDER — FUROSEMIDE 20 MG PO TABS
10.0000 mg | ORAL_TABLET | Freq: Every day | ORAL | Status: DC
  Administered 2018-01-21: 10 mg via ORAL
  Filled 2018-01-20 (×2): qty 0.5

## 2018-01-20 MED ORDER — ATORVASTATIN CALCIUM 20 MG PO TABS
10.0000 mg | ORAL_TABLET | Freq: Every day | ORAL | Status: DC
  Administered 2018-01-21: 10 mg via ORAL
  Filled 2018-01-20: qty 1

## 2018-01-20 MED ORDER — NICOTINE 21 MG/24HR TD PT24
21.0000 mg | MEDICATED_PATCH | Freq: Every day | TRANSDERMAL | Status: DC
  Administered 2018-01-21: 21 mg via TRANSDERMAL
  Filled 2018-01-20: qty 1

## 2018-01-20 MED ORDER — QUETIAPINE FUMARATE 25 MG PO TABS
50.0000 mg | ORAL_TABLET | Freq: Every day | ORAL | Status: DC
  Administered 2018-01-21: 50 mg via ORAL
  Filled 2018-01-20: qty 2

## 2018-01-20 MED ORDER — ACETAMINOPHEN 325 MG PO TABS
650.0000 mg | ORAL_TABLET | Freq: Once | ORAL | Status: AC
  Administered 2018-01-21: 650 mg via ORAL
  Filled 2018-01-20: qty 2

## 2018-01-20 MED ORDER — GABAPENTIN 400 MG PO CAPS
800.0000 mg | ORAL_CAPSULE | Freq: Three times a day (TID) | ORAL | Status: DC
  Administered 2018-01-21 (×2): 800 mg via ORAL
  Filled 2018-01-20 (×5): qty 2

## 2018-01-20 MED ORDER — ASPIRIN EC 81 MG PO TBEC
81.0000 mg | DELAYED_RELEASE_TABLET | Freq: Every day | ORAL | Status: DC
  Administered 2018-01-21: 81 mg via ORAL
  Filled 2018-01-20: qty 1

## 2018-01-20 NOTE — ED Triage Notes (Addendum)
Pt arrives to ED via [facility] from [facility] under IVC for c/o's "She is depressed. She is in pain. She sits and cries. She has threatened to kill herself". Pt denies any h/x of treatment for psychiatric issues in the past. Pt denies drug or EHOT use h/x.

## 2018-01-20 NOTE — ED Notes (Signed)
Pt walked with the walker to the bathroom

## 2018-01-20 NOTE — ED Provider Notes (Signed)
[facility] Emergency Department Provider Note   ____________________________________________   First MD Initiated Contact with Patient 01/20/18 2316     (approximate)  I have reviewed the triage vital signs and the nursing notes.   HISTORY  Chief Complaint Mental Health Problem    HPI Sandy Daniel is a 84 y.o. female brought to the ED by police under IVC for depression with suicidal ideation.  Patient was seen at [facility] and placed under IVC for her safety.  Complains of left hip, knee and ankle pain.  Vague historian but it does not appear that she has had any recent fall or trauma.  Otherwise denies recent fever, chills, chest pain, shortness of breath, abdominal pain, nausea or vomiting.   Past Medical History:  Diagnosis Date  . Arthritis   . Asthma   . B12 deficiency   . CAD (coronary artery disease)   . COPD (chronic obstructive pulmonary disease) (HCC)   . Depression   . Epilepsy (HCC)   . GERD (gastroesophageal reflux disease)   . Hyperlipidemia   . Hypertension   . Hypoxemia   . Macular degeneration (senile) of retina   . Macular degeneration of both eyes   . MI (myocardial infarction) (HCC) 09/2003, 05/2013  . Physiological tremor   . Presence of stent of CABG    x2   . Seizures [facility])     Patient Active Problem List   Diagnosis Date Noted  . Hip fracture (HCC) 12/01/2017  . COPD with acute exacerbation (HCC) 10/21/2017  . Cervical spondylosis without myelopathy 09/17/2017  . Disability of walking 09/17/2017  . Chronic pain syndrome 09/17/2017  . DDD (degenerative disc disease), cervical 09/17/2017  . Chest pain 09/07/2017  . 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 Major depressive disorder with single episode, in partial remission (HCC) 05/15/2017  . Neck pain 01/31/2017  . Chronic right shoulder pain 01/31/2017  . Chronic bilateral thoracic back pain 12/04/2016  . Muscle spasm of back 10/22/2016    . (HFpEF) heart failure with preserved ejection fraction (HCC) 09/11/2016  . Anxiety 07/16/2016  . Chronic bilateral low back pain 07/16/2016  . Unstable gait 07/16/2016  . Controlled substance agreement signed 03/27/2016  . Dysphagia 03/26/2016  . Panic disorder 03/26/2016  . CAD (coronary artery disease), native coronary artery   . Seizure disorder (HCC)   . Macular degeneration of both eyes   . B12 deficiency   . COPD (chronic obstructive pulmonary disease) (HCC)   . HTN (hypertension)   . Hyperlipidemia   . Arthritis   . Depression   . GERD (gastroesophageal reflux disease)   . Physiological tremor 02/18/2014    Past Surgical History:  Procedure Laterality Date  . ABDOMINAL HYSTERECTOMY    . APPENDECTOMY    . CARDIAC CATHETERIZATION  05/2013  . CARDIAC SURGERY    . CATARACT EXTRACTION  03/2016  . CHOLECYSTECTOMY  1962  . CORONARY ANGIOPLASTY WITH STENT PLACEMENT  Aug. 2005 and March 2015  . INTRAMEDULLARY (IM) NAIL INTERTROCHANTERIC Left 12/02/2017   Procedure: INTRAMEDULLARY (IM) NAIL INTERTROCHANTRIC- LEFT TFNA;  Surgeon: Lyndle Herrlich, MD;  Location: [facility] ORS;  Service: Orthopedics;  Laterality: Left;  . ROTATOR CUFF REPAIR Right   . VENTRAL HERNIA REPAIR  2002    Prior to Admission medications   Medication Sig Start Date End Date Taking? Authorizing Provider  acetaminophen (TYLENOL) 325 MG tablet Take 2 tablets (650 mg total) by mouth every 6 (six) hours as  needed for mild pain (or Fever >/= 101). 12/04/17   Gouru, Deanna Artis, MD  albuterol (PROVENTIL HFA;VENTOLIN HFA) 108 (90 Base) MCG/ACT inhaler Inhale 2 puffs into the lungs every 6 (six) hours as needed for wheezing or shortness of breath. 08/13/17   Galen Manila, NP  albuterol (PROVENTIL) (2.5 MG/3ML) 0.083% nebulizer solution Take 3 mLs (2.5 mg total) by nebulization every 6 (six) hours as needed for Wheezing. 08/13/17   Galen Manila, NP  aspirin 81 MG tablet Take 1 tablet (81 mg total) by mouth  daily. 11/08/17   Galen Manila, NP  atorvastatin (LIPITOR) 10 MG tablet Take 1 tablet (10 mg total) by mouth daily. 08/13/17   Galen Manila, NP  busPIRone (BUSPAR) 10 MG tablet Take 1 tablet (10 mg total) by mouth 2 (two) times daily. 08/13/17   Galen Manila, NP  enoxaparin (LOVENOX) 40 MG/0.4ML injection Inject 0.4 mLs (40 mg total) into the skin daily. 12/05/17   Ramonita Lab, MD  ferrous sulfate 325 (65 FE) MG tablet Take 1 tablet (325 mg total) by mouth 2 (two) times daily with a meal. 12/06/17   Gouru, Aruna, MD  Fluticasone-Salmeterol (ADVAIR) 100-50 MCG/DOSE AEPB Inhale 1 puff into the lungs 2 (two) times daily. 08/13/17   Galen Manila, NP  furosemide (LASIX) 20 MG tablet Take 0.5 tablets (10 mg total) by mouth daily. 08/13/17   Galen Manila, NP  gabapentin (NEURONTIN) 400 MG capsule Take 2 capsules (800 mg total) by mouth 3 (three) times daily. 08/13/17   Galen Manila, NP  HYDROcodone-acetaminophen (NORCO/VICODIN) 5-325 MG tablet Take 1-2 tablets by mouth every 6 (six) hours as needed for moderate pain. 12/04/17   Gouru, Deanna Artis, MD  lidocaine (LIDODERM) 5 % Place 1 patch onto the skin daily. Remove & Discard patch within 12 hours or as directed by MD 12/05/17   Ramonita Lab, MD  magnesium hydroxide (MILK OF MAGNESIA) 400 MG/5ML suspension Take 30 mLs by mouth 2 (two) times daily. 12/06/17   Gouru, Deanna Artis, MD  nicotine (NICODERM CQ - DOSED IN MG/24 HOURS) 21 mg/24hr patch Place 1 patch (21 mg total) onto the skin daily. 12/04/17   Ramonita Lab, MD  nitroGLYCERIN (NITROSTAT) 0.4 MG SL tablet Place 1 tablet (0.4 mg total) under the tongue every 5 (five) minutes as needed for chest pain. 06/12/17   Galen Manila, NP  pantoprazole (PROTONIX) 40 MG tablet Take 1 tablet (40 mg total) by mouth daily. 08/13/17   Galen Manila, NP  phenytoin (DILANTIN) 100 MG ER capsule TAKE 2 CAPSULES (200MG ) BY MOUTH AT BEDTIME Patient taking differently: Take 200  mg by mouth at bedtime.  09/20/17   Galen Manila, NP  polyethylene glycol [facility] Ethelene Hal) packet Take 17 g by mouth daily. 12/04/17   Gouru, Deanna Artis, MD  QUEtiapine (SEROQUEL) 50 MG tablet Take 1 tablet (50 mg total) by mouth at bedtime. 08/13/17   Galen Manila, NP  sertraline (ZOLOFT) 100 MG tablet Take 1 tablet (100 mg total) by mouth daily. 08/13/17   Galen Manila, NP    Allergies Motrin [ibuprofen]; Tolmetin; Alendronate; Other; and Tramadol hcl  Family History  Problem Relation Age of Onset  . Diabetes Mother   . Cancer Sister   . Diabetes Sister   . Heart disease Sister   . Heart attack Sister   . Hyperlipidemia Sister   . Hypertension Sister   . Cancer Brother   . Diabetes Brother   .  Heart disease Brother   . Heart attack Brother   . Hyperlipidemia Brother   . Hypertension Brother   . Diabetes Brother   . Diabetes Brother   . Kidney failure Brother     Social History Social History   Tobacco Use  . Smoking status: Current Every Day Smoker    Packs/day: 1.00    Years: 50.00    Pack years: 50.00    Types: Cigarettes  . Smokeless tobacco: Never Used  . Tobacco comment: 10 a day   Substance Use Topics  . Alcohol use: No  . Drug use: No    Review of Systems  Constitutional: No fever/chills Eyes: No visual changes. ENT: No sore throat. Cardiovascular: Denies chest pain. Respiratory: Denies shortness of breath. Gastrointestinal: No abdominal pain.  No nausea, no vomiting.  No diarrhea.  No constipation. Genitourinary: Negative for dysuria. Musculoskeletal: Positive for left hip, knee and ankle pain.  Negative for back pain. Skin: Negative for rash. Neurological: Negative for headaches, focal weakness or numbness. Psychiatric:Positive for depression with suicidal ideation.  ____________________________________________   PHYSICAL EXAM:  VITAL SIGNS: ED Triage Vitals  Enc Vitals Group     BP 01/20/18 2043 117/66     Pulse Rate  01/20/18 2043 76     Resp 01/20/18 2043 20     Temp 01/20/18 2043 98.1 F (36.7 C)     Temp Source 01/20/18 2043 Oral     SpO2 01/20/18 2043 93 %     Weight 01/20/18 2050 143 lb 4.8 oz (65 kg)     Height 01/20/18 2050 5' (1.524 m)     Head Circumference --      Peak Flow --      Pain Score 01/20/18 2049 10     Pain Loc --      Pain Edu? --      Excl. in GC? --     Constitutional: Alert and oriented.  Elderly appearing and in no acute distress. Eyes: Conjunctivae are normal. PERRL. EOMI. Head: Atraumatic. Nose: No congestion/rhinnorhea. Mouth/Throat: Mucous membranes are moist.  Oropharynx non-erythematous. Neck: No stridor.  No cervical spine tenderness to palpation. Cardiovascular: Normal rate, irregular rhythm. Grossly normal heart sounds.  Good peripheral circulation. Respiratory: Normal respiratory effort.  No retractions. Lungs CTAB. Gastrointestinal: Soft and nontender. No distention. No abdominal bruits. No CVA tenderness. Musculoskeletal: Ambulates with walker.  Pelvis stable.  Left hip nontender to palpation with full range of motion.  Left knee nontender to palpation with full range of motion.  Mild effusion to lateral malleolus on the left.  Full range of motion without pain.  2+ distal pulses.   Neurologic:  Normal speech and language. No gross focal neurologic deficits are appreciated.  Skin:  Skin is warm, dry and intact. No rash noted. Psychiatric: Mood and affect are flat. Speech and behavior are normal.  ____________________________________________   LABS (all labs ordered are listed, but only abnormal results are displayed)  Labs Reviewed  COMPREHENSIVE METABOLIC PANEL - Abnormal; Notable for the following components:      Result Value   Glucose, Bld 101 (*)    Alkaline Phosphatase 157 (*)    All other components within normal limits  ACETAMINOPHEN LEVEL - Abnormal; Notable for the following components:   Acetaminophen (Tylenol), Serum <10 (*)    All other  components within normal limits  CBC - Abnormal; Notable for the following components:   RDW 17.2 (*)    All other components within normal limits  URINE DRUG SCREEN, QUALITATIVE ([facility] ONLY) - Abnormal; Notable for the following components:   Opiate, Ur Screen POSITIVE (*)    All other components within normal limits  URINALYSIS, COMPLETE (UACMP) WITH MICROSCOPIC - Abnormal; Notable for the following components:   Color, Urine YELLOW (*)    APPearance HAZY (*)    Leukocytes, UA LARGE (*)    Bacteria, UA RARE (*)    Non Squamous Epithelial PRESENT (*)    All other components within normal limits  PHENYTOIN LEVEL, TOTAL - Abnormal; Notable for the following components:   Phenytoin Lvl 7.5 (*)    All other components within normal limits  ETHANOL  SALICYLATE LEVEL  TROPONIN I   ____________________________________________  EKG  ED ECG REPORT I, Adelheid Hoggard J, the attending physician, personally viewed and interpreted this ECG.   Date: 01/20/2018  EKG Time: 2342  Rate: 84  Rhythm: atrial fibrillation, rate 84  Axis: Normal  Intervals:none  ST&T Change: Nonspecific  ____________________________________________  RADIOLOGY  ED MD interpretation: No acute osseous injuries  Official radiology report(s): Dg Ankle Complete Left  Result Date: 01/21/2018 CLINICAL DATA:  Left ankle pain following a fall 2 days ago. EXAM: LEFT ANKLE COMPLETE - 3+ VIEW COMPARISON:  None. FINDINGS: Diffuse soft tissue swelling. Diffuse osteopenia. No fracture or dislocation seen. No effusion visualized. Mild posterior and inferior calcaneal spur formation. Moderately large dorsal tarsal/metatarsal spurs. Mild flattening of the normal plantar arch. IMPRESSION: 1. No fracture or dislocation. 2. Degenerative changes, as described above. Electronically Signed   By: Beckie Salts M.D.   On: 01/21/2018 00:55   Dg Knee Complete 4 Views Left  Result Date: 01/21/2018 CLINICAL DATA:  Left knee pain following a  fall 2 days ago. EXAM: LEFT KNEE - COMPLETE 4+ VIEW COMPARISON:  Left hip and femur radiographs obtained at the same time. Left knee dated 12/03/2017. FINDINGS: Stable intramedullary rod in the distal femur. Previously noted degenerative changes involving all 3 joint compartments and medial and lateral meniscal calcifications. No fracture, dislocation or effusion. Atheromatous arterial calcifications. IMPRESSION: 1. No fracture or effusion. 2. Stable tricompartmental degenerative changes. 3. Chondrocalcinosis. Electronically Signed   By: Beckie Salts M.D.   On: 01/21/2018 00:54   Dg Hip Unilat With Pelvis 2-3 Views Left  Result Date: 01/21/2018 CLINICAL DATA:  Left hip and leg pain following a fall 2 days ago. EXAM: DG HIP (WITH OR WITHOUT PELVIS) 2-3V LEFT COMPARISON:  12/01/2017. FINDINGS: Interval intramedullary hardware fixation of the previously demonstrated left intertrochanteric fracture. No acute fracture or dislocation seen. Diffuse osteopenia. Pelvic and left lower abdomen surgical clips and pelvic staples. Lower lumbar spine degenerative changes. Atheromatous arterial calcifications. IMPRESSION: No acute fracture or dislocation. Electronically Signed   By: Beckie Salts M.D.   On: 01/21/2018 00:53    ____________________________________________   PROCEDURES  Procedure(s) performed: None  Procedures  Critical Care performed: No  ____________________________________________   INITIAL IMPRESSION / ASSESSMENT AND PLAN / ED COURSE  As part of my medical decision making, I reviewed the following data within the electronic MEDICAL RECORD NUMBER Nursing notes reviewed and incorporated, Labs reviewed, EKG interpreted, Old chart reviewed and Notes from prior ED visits   84 year old female who presents under IVC from [facility].  TTS counselor is involved and will be referring patient to geriatric psychiatric facility.  Laboratory results unremarkable.  Will order patient's home meds to be taken while  she is in the emergency department.  Otherwise she is medically cleared for psychiatric disposition.  Clinical  Course as of Jan 22 635  Tue Jan 21, 2018  0213 No acute osseous injuries on x-ray.  Will start antibiotic for UTI.  Noted low phenytoin level; will administer her nighttime phenytoin dose.   [JS]    Clinical Course User Index [JS] Irean Hong, MD     ____________________________________________   FINAL CLINICAL IMPRESSION(S) / ED DIAGNOSES  Final diagnoses:  Moderate episode of recurrent major depressive disorder (HCC)  Urinary tract infection without hematuria, site unspecified     ED Discharge Orders    None       Note:  This document was prepared using Dragon voice recognition software and may include unintentional dictation errors.    Irean Hong, MD 01/21/18 [phone]

## 2018-01-21 ENCOUNTER — Emergency Department: Payer: Medicare HMO

## 2018-01-21 ENCOUNTER — Telehealth: Payer: Self-pay | Admitting: Nurse Practitioner

## 2018-01-21 LAB — URINALYSIS, COMPLETE (UACMP) WITH MICROSCOPIC
Bilirubin Urine: NEGATIVE
GLUCOSE, UA: NEGATIVE mg/dL
Hgb urine dipstick: NEGATIVE
Ketones, ur: NEGATIVE mg/dL
Nitrite: NEGATIVE
PH: 7 (ref 5.0–8.0)
PROTEIN: NEGATIVE mg/dL
SPECIFIC GRAVITY, URINE: 1.016 (ref 1.005–1.030)

## 2018-01-21 LAB — URINE DRUG SCREEN, QUALITATIVE (ARMC ONLY)
Amphetamines, Ur Screen: NOT DETECTED
BARBITURATES, UR SCREEN: NOT DETECTED
BENZODIAZEPINE, UR SCRN: NOT DETECTED
CANNABINOID 50 NG, UR ~~LOC~~: NOT DETECTED
Cocaine Metabolite,Ur ~~LOC~~: NOT DETECTED
MDMA (Ecstasy)Ur Screen: NOT DETECTED
Methadone Scn, Ur: NOT DETECTED
Opiate, Ur Screen: POSITIVE — AB
Phencyclidine (PCP) Ur S: NOT DETECTED
Tricyclic, Ur Screen: NOT DETECTED

## 2018-01-21 LAB — PHENYTOIN LEVEL, TOTAL: Phenytoin Lvl: 7.5 ug/mL — ABNORMAL LOW (ref 10.0–20.0)

## 2018-01-21 MED ORDER — CEPHALEXIN 250 MG PO CAPS
250.0000 mg | ORAL_CAPSULE | Freq: Three times a day (TID) | ORAL | Status: DC
  Administered 2018-01-21: 250 mg via ORAL
  Filled 2018-01-21: qty 1

## 2018-01-21 NOTE — ED Provider Notes (Signed)
TTS letter new patient was accepted to [facility], I did complete his Impella instructions   Governor Rooks, MD 01/21/18 1156

## 2018-01-21 NOTE — ED Provider Notes (Signed)
ED EKG.  Reviewed and interpreted by myself, Dr. Shaune Pollack MD  Normal sinus rhythm.  68 bpm.  Narrow QRS.  Left axis deviation.  Normal ST and T wave   Governor Rooks, MD 01/21/18 [phone]

## 2018-01-21 NOTE — BH Assessment (Signed)
Referral information for Psychiatric Hospitalization re-faxed to;   [facility] [place] [place] [phone]),   . [place] ([phone]),  . [facility] [phone], [phone], [phone] or [phone]),   . [facility] [phone]),   . [facility] [phone]),   . [place] [phone])  . [place] [phone]),   . [address]Franky [place] [phone]),   . [facility] [phone] or [phone])  . [place] [phone] or [phone]),   . [facility]

## 2018-01-21 NOTE — ED Notes (Signed)
Writer called Baldo Ash, RN to inform him that patient is in route to [facility].

## 2018-01-21 NOTE — Telephone Encounter (Signed)
Sandy Daniel with [facility] called to report a missed visit [phone]

## 2018-01-21 NOTE — ED Notes (Signed)
Referral information for Psychiatric Hospitalization faxed to;   [facility] [phone]),   . [place] [facility] [phone]   . [place] ([phone]),  . [facility] [phone], [phone], [phone] or [phone]),   . [place] [phone] or [phone]),

## 2018-01-21 NOTE — BH Assessment (Signed)
Patient has been accepted to [facility].  Patient assigned to room 318. Accepting physician is Dr. Lars Mage.  Call report to [phone].  Representative was [employer]   ER Staff is aware of it:  Rivka Barbara, ER Secretary  Dr. Shaune Pollack, ER MD  Everardo Pacific, Patient's Nurse   Address: [facility] [address] [place], [place] [address] [phone]

## 2018-01-21 NOTE — ED Notes (Signed)
EMTALA reviewed by charge RN 

## 2018-01-22 NOTE — Telephone Encounter (Signed)
Missed visit acknowledged.

## 2018-01-27 ENCOUNTER — Ambulatory Visit: Payer: Self-pay | Admitting: Nurse Practitioner

## 2018-02-05 ENCOUNTER — Encounter: Payer: Self-pay | Admitting: Emergency Medicine

## 2018-02-05 ENCOUNTER — Emergency Department: Payer: Medicare HMO

## 2018-02-05 ENCOUNTER — Observation Stay
Admission: EM | Admit: 2018-02-05 | Discharge: 2018-02-06 | Disposition: A | Discharge status: Home Health | Payer: Medicare HMO | Attending: Internal Medicine | Admitting: Internal Medicine

## 2018-02-05 ENCOUNTER — Other Ambulatory Visit: Payer: Self-pay

## 2018-02-05 DIAGNOSIS — R1010 Upper abdominal pain, unspecified: Secondary | ICD-10-CM | POA: Diagnosis present

## 2018-02-05 DIAGNOSIS — F419 Anxiety disorder, unspecified: Secondary | ICD-10-CM | POA: Diagnosis not present

## 2018-02-05 DIAGNOSIS — K859 Acute pancreatitis without necrosis or infection, unspecified: Secondary | ICD-10-CM | POA: Insufficient documentation

## 2018-02-05 DIAGNOSIS — I10 Essential (primary) hypertension: Secondary | ICD-10-CM | POA: Diagnosis present

## 2018-02-05 DIAGNOSIS — R079 Chest pain, unspecified: Secondary | ICD-10-CM | POA: Diagnosis not present

## 2018-02-05 DIAGNOSIS — Z79899 Other long term (current) drug therapy: Secondary | ICD-10-CM | POA: Diagnosis not present

## 2018-02-05 DIAGNOSIS — I110 Hypertensive heart disease with heart failure: Secondary | ICD-10-CM | POA: Insufficient documentation

## 2018-02-05 DIAGNOSIS — G40909 Epilepsy, unspecified, not intractable, without status epilepticus: Secondary | ICD-10-CM | POA: Diagnosis not present

## 2018-02-05 DIAGNOSIS — K219 Gastro-esophageal reflux disease without esophagitis: Secondary | ICD-10-CM | POA: Diagnosis not present

## 2018-02-05 DIAGNOSIS — Z7982 Long term (current) use of aspirin: Secondary | ICD-10-CM | POA: Insufficient documentation

## 2018-02-05 DIAGNOSIS — I5030 Unspecified diastolic (congestive) heart failure: Secondary | ICD-10-CM | POA: Diagnosis present

## 2018-02-05 DIAGNOSIS — Z888 Allergy status to other drugs, medicaments and biological substances status: Secondary | ICD-10-CM | POA: Insufficient documentation

## 2018-02-05 DIAGNOSIS — R51 Headache: Secondary | ICD-10-CM | POA: Insufficient documentation

## 2018-02-05 DIAGNOSIS — E785 Hyperlipidemia, unspecified: Secondary | ICD-10-CM | POA: Diagnosis not present

## 2018-02-05 DIAGNOSIS — R109 Unspecified abdominal pain: Secondary | ICD-10-CM | POA: Diagnosis present

## 2018-02-05 DIAGNOSIS — I2510 Atherosclerotic heart disease of native coronary artery without angina pectoris: Secondary | ICD-10-CM | POA: Diagnosis present

## 2018-02-05 DIAGNOSIS — G894 Chronic pain syndrome: Secondary | ICD-10-CM | POA: Diagnosis present

## 2018-02-05 DIAGNOSIS — F17210 Nicotine dependence, cigarettes, uncomplicated: Secondary | ICD-10-CM | POA: Diagnosis not present

## 2018-02-05 DIAGNOSIS — Z951 Presence of aortocoronary bypass graft: Secondary | ICD-10-CM | POA: Diagnosis not present

## 2018-02-05 DIAGNOSIS — R519 Headache, unspecified: Secondary | ICD-10-CM

## 2018-02-05 DIAGNOSIS — J449 Chronic obstructive pulmonary disease, unspecified: Secondary | ICD-10-CM | POA: Diagnosis present

## 2018-02-05 DIAGNOSIS — R110 Nausea: Secondary | ICD-10-CM | POA: Diagnosis present

## 2018-02-05 LAB — CBC WITH DIFFERENTIAL/PLATELET
Abs Immature Granulocytes: 0.01 10*3/uL (ref 0.00–0.07)
Basophils Absolute: 0 10*3/uL (ref 0.0–0.1)
Basophils Relative: 1 %
Eosinophils Absolute: 0.1 10*3/uL (ref 0.0–0.5)
Eosinophils Relative: 2 %
HCT: 42.9 % (ref 36.0–46.0)
Hemoglobin: 13.9 g/dL (ref 12.0–15.0)
Immature Granulocytes: 0 %
LYMPHS ABS: 2 10*3/uL (ref 0.7–4.0)
Lymphocytes Relative: 39 %
MCH: 31.7 pg (ref 26.0–34.0)
MCHC: 32.4 g/dL (ref 30.0–36.0)
MCV: 97.7 fL (ref 80.0–100.0)
MONO ABS: 0.6 10*3/uL (ref 0.1–1.0)
MONOS PCT: 11 %
Neutro Abs: 2.4 10*3/uL (ref 1.7–7.7)
Neutrophils Relative %: 47 %
Platelets: 300 10*3/uL (ref 150–400)
RBC: 4.39 MIL/uL (ref 3.87–5.11)
RDW: 14.6 % (ref 11.5–15.5)
WBC: 5.1 10*3/uL (ref 4.0–10.5)
nRBC: 0 % (ref 0.0–0.2)

## 2018-02-05 LAB — URINALYSIS, COMPLETE (UACMP) WITH MICROSCOPIC
Bacteria, UA: NONE SEEN
Bilirubin Urine: NEGATIVE
Glucose, UA: NEGATIVE mg/dL
Hgb urine dipstick: NEGATIVE
Ketones, ur: 20 mg/dL — AB
Leukocytes, UA: NEGATIVE
Nitrite: NEGATIVE
Protein, ur: 30 mg/dL — AB
Specific Gravity, Urine: 1.017 (ref 1.005–1.030)
pH: 6 (ref 5.0–8.0)

## 2018-02-05 LAB — PHENYTOIN LEVEL, TOTAL: Phenytoin Lvl: 10.1 ug/mL (ref 10.0–20.0)

## 2018-02-05 LAB — COMPREHENSIVE METABOLIC PANEL
ALK PHOS: 193 U/L — AB (ref 38–126)
ALT: 22 U/L (ref 0–44)
AST: 30 U/L (ref 15–41)
Albumin: 3.7 g/dL (ref 3.5–5.0)
Anion gap: 11 (ref 5–15)
BILIRUBIN TOTAL: 0.6 mg/dL (ref 0.3–1.2)
BUN: 23 mg/dL (ref 8–23)
CO2: 25 mmol/L (ref 22–32)
Calcium: 9.1 mg/dL (ref 8.9–10.3)
Chloride: 103 mmol/L (ref 98–111)
Creatinine, Ser: 0.63 mg/dL (ref 0.44–1.00)
GFR calc Af Amer: >60 mL/min (ref >60)
GFR calc non Af Amer: >60 mL/min (ref >60)
Glucose, Bld: 99 mg/dL (ref 70–99)
Potassium: 4.1 mmol/L (ref 3.5–5.1)
Sodium: 139 mmol/L (ref 135–145)
Total Protein: 7.3 g/dL (ref 6.5–8.1)

## 2018-02-05 LAB — LIPASE, BLOOD: LIPASE: 34 U/L (ref 11–51)

## 2018-02-05 LAB — TROPONIN I
Troponin I: <0.03 ng/mL (ref <0.03)
Troponin I: <0.03 ng/mL (ref <0.03)

## 2018-02-05 LAB — SEDIMENTATION RATE: Sed Rate: 53 mm/hr — ABNORMAL HIGH (ref 0–30)

## 2018-02-05 MED ORDER — MAGNESIUM HYDROXIDE 2400 MG/10ML PO SUSP
10.00 | ORAL | Status: DC
Start: ? — End: 2018-02-05

## 2018-02-05 MED ORDER — OXYCODONE HCL 5 MG PO TABS
5.0000 mg | ORAL_TABLET | ORAL | Status: DC | PRN
  Administered 2018-02-06: 5 mg via ORAL
  Filled 2018-02-05: qty 1

## 2018-02-05 MED ORDER — ACETAMINOPHEN 325 MG PO TABS: 650.0000 mg | ORAL_TABLET | Freq: Four times a day (QID) | ORAL | Status: DC | PRN

## 2018-02-05 MED ORDER — FUROSEMIDE 20 MG PO TABS
10.00 | ORAL_TABLET | ORAL | Status: DC
Start: 2018-02-05 — End: 2018-02-05

## 2018-02-05 MED ORDER — GABAPENTIN 300 MG PO CAPS
800.0000 mg | ORAL_CAPSULE | Freq: Three times a day (TID) | ORAL | Status: DC
  Administered 2018-02-06 (×2): 800 mg via ORAL
  Filled 2018-02-05 (×2): qty 2

## 2018-02-05 MED ORDER — ENOXAPARIN SODIUM 40 MG/0.4ML ~~LOC~~ SOLN: 40.0000 mg | SUBCUTANEOUS | Status: DC

## 2018-02-05 MED ORDER — SODIUM CHLORIDE 0.9 % IV BOLUS
500.0000 mL | Freq: Once | INTRAVENOUS | Status: AC
  Administered 2018-02-05: 500 mL via INTRAVENOUS

## 2018-02-05 MED ORDER — FERROUS GLUCONATE 324 (37.5 FE) MG PO TABS
ORAL_TABLET | ORAL | Status: DC
Start: 2018-02-05 — End: 2018-02-05

## 2018-02-05 MED ORDER — TRAZODONE HCL 50 MG PO TABS: 50.0000 mg | ORAL_TABLET | Freq: Every evening | ORAL | Status: DC | PRN

## 2018-02-05 MED ORDER — GABAPENTIN 300 MG PO CAPS
300.00 | ORAL_CAPSULE | ORAL | Status: DC
Start: 2018-02-04 — End: 2018-02-05

## 2018-02-05 MED ORDER — ATORVASTATIN CALCIUM 20 MG PO TABS
10.0000 mg | ORAL_TABLET | Freq: Every day | ORAL | Status: DC
  Administered 2018-02-06: 10 mg via ORAL
  Filled 2018-02-05: qty 1

## 2018-02-05 MED ORDER — ASPIRIN EC 81 MG PO TBEC
81.00 | DELAYED_RELEASE_TABLET | ORAL | Status: DC
Start: 2018-02-05 — End: 2018-02-05

## 2018-02-05 MED ORDER — ATORVASTATIN CALCIUM 10 MG PO TABS
10.00 | ORAL_TABLET | ORAL | Status: DC
Start: 2018-02-04 — End: 2018-02-05

## 2018-02-05 MED ORDER — ONDANSETRON HCL 4 MG/2ML IJ SOLN
4.0000 mg | Freq: Once | INTRAMUSCULAR | Status: AC
  Administered 2018-02-05: 4 mg via INTRAVENOUS
  Filled 2018-02-05: qty 2

## 2018-02-05 MED ORDER — ALBUTEROL SULFATE (2.5 MG/3ML) 0.083% IN NEBU
2.50 | INHALATION_SOLUTION | RESPIRATORY_TRACT | Status: DC
Start: ? — End: 2018-02-05

## 2018-02-05 MED ORDER — ONDANSETRON 4 MG PO TBDP
4.00 | ORAL_TABLET | ORAL | Status: DC
Start: ? — End: 2018-02-05

## 2018-02-05 MED ORDER — ACETAMINOPHEN 325 MG PO TABS
650.00 | ORAL_TABLET | ORAL | Status: DC
Start: ? — End: 2018-02-05

## 2018-02-05 MED ORDER — QUETIAPINE FUMARATE 25 MG PO TABS
50.0000 mg | ORAL_TABLET | Freq: Every day | ORAL | Status: DC
  Administered 2018-02-06: 50 mg via ORAL
  Filled 2018-02-05: qty 2

## 2018-02-05 MED ORDER — FUROSEMIDE 20 MG PO TABS
10.0000 mg | ORAL_TABLET | Freq: Every day | ORAL | Status: DC
  Administered 2018-02-06: 10 mg via ORAL
  Filled 2018-02-05: qty 1

## 2018-02-05 MED ORDER — POLYETHYLENE GLYCOL 3350 17 G PO PACK
17.00 | PACK | ORAL | Status: DC
Start: ? — End: 2018-02-05

## 2018-02-05 MED ORDER — TRAZODONE HCL 50 MG PO TABS
50.00 | ORAL_TABLET | ORAL | Status: DC
Start: ? — End: 2018-02-05

## 2018-02-05 MED ORDER — MORPHINE SULFATE (PF) 2 MG/ML IV SOLN: 2.0000 mg | INTRAVENOUS | Status: DC | PRN

## 2018-02-05 MED ORDER — HALOPERIDOL LACTATE 5 MG/ML IJ SOLN
INTRAMUSCULAR | Status: AC
  Administered 2018-02-05: 2 mg via INTRAVENOUS
  Filled 2018-02-05: qty 1

## 2018-02-05 MED ORDER — SERTRALINE HCL 50 MG PO TABS
100.00 | ORAL_TABLET | ORAL | Status: DC
Start: 2018-02-05 — End: 2018-02-05

## 2018-02-05 MED ORDER — SODIUM CHLORIDE 0.9 % IV SOLN
INTRAVENOUS | Status: AC
  Administered 2018-02-05: via INTRAVENOUS

## 2018-02-05 MED ORDER — FENTANYL CITRATE (PF) 100 MCG/2ML IJ SOLN
50.0000 ug | Freq: Once | INTRAMUSCULAR | Status: AC
  Administered 2018-02-05: 50 ug via INTRAVENOUS
  Filled 2018-02-05: qty 2

## 2018-02-05 MED ORDER — PHENYTOIN SODIUM EXTENDED 100 MG PO CAPS
200.0000 mg | ORAL_CAPSULE | Freq: Every day | ORAL | Status: DC
  Administered 2018-02-06: 200 mg via ORAL
  Filled 2018-02-05 (×2): qty 2

## 2018-02-05 MED ORDER — IOHEXOL 300 MG/ML  SOLN
75.0000 mL | Freq: Once | INTRAMUSCULAR | Status: AC | PRN
  Administered 2018-02-05: 75 mL via INTRAVENOUS

## 2018-02-05 MED ORDER — IPRATROPIUM-ALBUTEROL 0.5-2.5 (3) MG/3ML IN SOLN
3.00 | RESPIRATORY_TRACT | Status: DC
Start: ? — End: 2018-02-05

## 2018-02-05 MED ORDER — ASPIRIN 81 MG PO CHEW
81.0000 mg | CHEWABLE_TABLET | Freq: Every day | ORAL | Status: DC
  Administered 2018-02-06: 10:00:00 81 mg via ORAL
  Filled 2018-02-05: qty 1

## 2018-02-05 MED ORDER — SERTRALINE HCL 50 MG PO TABS
100.0000 mg | ORAL_TABLET | Freq: Every day | ORAL | Status: DC
  Administered 2018-02-06: 10:00:00 100 mg via ORAL
  Filled 2018-02-05: qty 2

## 2018-02-05 MED ORDER — HALOPERIDOL LACTATE 5 MG/ML IJ SOLN
2.0000 mg | Freq: Once | INTRAMUSCULAR | Status: AC
  Administered 2018-02-05: 2 mg via INTRAVENOUS

## 2018-02-05 MED ORDER — PANTOPRAZOLE SODIUM 40 MG PO TBEC
40.0000 mg | DELAYED_RELEASE_TABLET | Freq: Every day | ORAL | Status: DC
  Administered 2018-02-06: 10:00:00 40 mg via ORAL
  Filled 2018-02-05: qty 1

## 2018-02-05 MED ORDER — HALOPERIDOL LACTATE 5 MG/ML IJ SOLN: 2.0000 mg | Freq: Once | INTRAMUSCULAR | Status: DC

## 2018-02-05 MED ORDER — PHENYTOIN SODIUM EXTENDED 100 MG PO CAPS
200.00 | ORAL_CAPSULE | ORAL | Status: DC
Start: 2018-02-04 — End: 2018-02-05

## 2018-02-05 MED ORDER — QUETIAPINE FUMARATE 50 MG PO TABS
50.00 | ORAL_TABLET | ORAL | Status: DC
Start: 2018-02-04 — End: 2018-02-05

## 2018-02-05 MED ORDER — ONDANSETRON HCL 4 MG PO TABS: 4.0000 mg | ORAL_TABLET | Freq: Four times a day (QID) | ORAL | Status: DC | PRN

## 2018-02-05 MED ORDER — PANTOPRAZOLE SODIUM 40 MG PO TBEC
40.00 | DELAYED_RELEASE_TABLET | ORAL | Status: DC
Start: 2018-02-05 — End: 2018-02-05

## 2018-02-05 MED ORDER — ONDANSETRON HCL 4 MG/2ML IJ SOLN: 4.0000 mg | Freq: Four times a day (QID) | INTRAMUSCULAR | Status: DC | PRN

## 2018-02-05 MED ORDER — BISACODYL 10 MG RE SUPP
10.00 | RECTAL | Status: DC
Start: ? — End: 2018-02-05

## 2018-02-05 MED ORDER — ACETAMINOPHEN 650 MG RE SUPP: 650.0000 mg | Freq: Four times a day (QID) | RECTAL | Status: DC | PRN

## 2018-02-05 NOTE — ED Notes (Signed)
ED TO INPATIENT HANDOFF REPORT  Name/Age/Gender Sandy Daniel 84 y.o. female  Code Status Code Status History    Date Active Date Inactive Code Status Order ID Comments User Context   12/01/2017 1838 12/06/2017 2035 DNR [id number]  Ihor Austin, MD Inpatient   10/22/2017 0954 10/27/2017 2328 DNR [id number]  Ramonita Lab, MD Inpatient   10/22/2017 0107 10/22/2017 0953 Full Code [id number]  Oralia Manis, MD Inpatient   09/07/2017 1354 09/08/2017 1933 Full Code [id number]  Shaune Pollack, MD Inpatient   07/11/2016 1416 07/13/2016 1824 Full Code [id number]  Milagros Loll, MD ED    Questions for Most Recent Historical Code Status (Order [id number])    Question Answer Comment   In the event of cardiac or respiratory ARREST Do not call a "code blue"    In the event of cardiac or respiratory ARREST Do not perform Intubation, CPR, defibrillation or ACLS    In the event of cardiac or respiratory ARREST Use medication by any route, position, wound care, and other measures to relive pain and suffering. May use oxygen, suction and manual treatment of airway obstruction as needed for comfort.    Comments RN may pronounce       Home/SNF/Other Home  Chief Complaint Weakness  Level of Care/Admitting Diagnosis ED Disposition    ED Disposition Condition Comment   Admit  Hospital Area: [facility] [[id number]]  Level of Care: Med-Surg [16]  Diagnosis: Abdominal pain [536644]  Admitting Physician: Oralia Manis [[id number]]  Attending Physician: Oralia Manis [[id number]]  Estimated length of stay: past midnight tomorrow  Certification:: I certify this patient will need inpatient services for at least 2 midnights  PT Class (Do Not Modify): Inpatient [101]  PT Acc Code (Do Not Modify): Private [1]       Medical History Past Medical History:  Diagnosis Date  . Arthritis   . Asthma   . B12 deficiency   . CAD (coronary artery disease)   . COPD (chronic obstructive pulmonary disease)  (HCC)   . Depression   . Epilepsy (HCC)   . GERD (gastroesophageal reflux disease)   . Hyperlipidemia   . Hypertension   . Hypoxemia   . Macular degeneration (senile) of retina   . Macular degeneration of both eyes   . MI (myocardial infarction) (HCC) 09/2003, 05/2013  . Physiological tremor   . Presence of stent of CABG    x2   . Seizures (HCC)     Allergies Allergies  Allergen Reactions  . Motrin [Ibuprofen] Shortness Of Breath and Swelling    Swelling of lip/tongue  . Tolmetin Rash  . Alendronate     Other reaction(s): Nausea And Vomiting Other reaction(s): Nausea And Vomiting  . Other Rash    Any Steriods. Causes mouth sores  . Tramadol Hcl Nausea And Vomiting    IV Location/Drains/Wounds Patient Lines/Drains/Airways Status   Active Line/Drains/Airways    Name:   Placement date:   Placement time:   Site:   Days:   Peripheral IV 02/05/18 Left Antecubital   02/05/18    1808    Antecubital   less than 1   External Urinary Catheter   02/05/18    2124    -   less than 1   Incision (Closed) 12/02/17 Hip Left   12/02/17    1311     65          Labs/Imaging Results for orders placed or performed during the hospital encounter of 02/05/18 (from the past  48 hour(s))  Phenytoin level, total     Status: None   Collection Time: 02/05/18  6:09 PM  Result Value Ref Range   Phenytoin Lvl 10.1 10.0 - 20.0 ug/mL    Comment: Performed at [facility], [address] [place], [place] [address]  CBC with Differential     Status: None   Collection Time: 02/05/18  6:09 PM  Result Value Ref Range   WBC 5.1 4.0 - 10.5 K/uL   RBC 4.39 3.87 - 5.11 MIL/uL   Hemoglobin 13.9 12.0 - 15.0 g/dL   HCT 60.4 54.0 - 98.1 %   MCV 97.7 80.0 - 100.0 fL   MCH 31.7 26.0 - 34.0 pg   MCHC 32.4 30.0 - 36.0 g/dL   RDW 19.1 47.8 - 29.5 %   Platelets 300 150 - 400 K/uL   nRBC 0.0 0.0 - 0.2 %   Neutrophils Relative % 47 %   Neutro Abs 2.4 1.7 - 7.7 K/uL   Lymphocytes Relative 39 %    Lymphs Abs 2.0 0.7 - 4.0 K/uL   Monocytes Relative 11 %   Monocytes Absolute 0.6 0.1 - 1.0 K/uL   Eosinophils Relative 2 %   Eosinophils Absolute 0.1 0.0 - 0.5 K/uL   Basophils Relative 1 %   Basophils Absolute 0.0 0.0 - 0.1 K/uL   Immature Granulocytes 0 %   Abs Immature Granulocytes 0.01 0.00 - 0.07 K/uL    Comment: Performed at [facility], [address] [place], [place] [address]  Comprehensive metabolic panel     Status: Abnormal   Collection Time: 02/05/18  6:09 PM  Result Value Ref Range   Sodium 139 135 - 145 mmol/L   Potassium 4.1 3.5 - 5.1 mmol/L   Chloride 103 98 - 111 mmol/L   CO2 25 22 - 32 mmol/L   Glucose, Bld 99 70 - 99 mg/dL   BUN 23 8 - 23 mg/dL   Creatinine, Ser 8.65 0.44 - 1.00 mg/dL   Calcium 9.1 8.9 - 78.4 mg/dL   Total Protein 7.3 6.5 - 8.1 g/dL   Albumin 3.7 3.5 - 5.0 g/dL   AST 30 15 - 41 U/L   ALT 22 0 - 44 U/L   Alkaline Phosphatase 193 (H) 38 - 126 U/L   Total Bilirubin 0.6 0.3 - 1.2 mg/dL   GFR calc non Af Amer >60 >60 mL/min   GFR calc Af Amer >60 >60 mL/min   Anion gap 11 5 - 15    Comment: Performed at [facility], [address] [place], [place] [address]  Lipase, blood     Status: None   Collection Time: 02/05/18  6:09 PM  Result Value Ref Range   Lipase 34 11 - 51 U/L    Comment: Performed at [facility], [address] [place], [place] [address]  Troponin I - ONCE - STAT     Status: None   Collection Time: 02/05/18  6:09 PM  Result Value Ref Range   Troponin I <0.03 <0.03 ng/mL    Comment: Performed at [facility], [address] [place], [place] [address]  Sedimentation rate     Status: Abnormal   Collection Time: 02/05/18  6:09 PM  Result Value Ref Range   Sed Rate 53 (H) 0 - 30 mm/hr    Comment: Performed at [facility], [address] [place], [place] [address]  Urinalysis, Complete w Microscopic     Status: Abnormal  Collection Time: 02/05/18  7:42 PM  Result Value  Ref Range   Color, Urine YELLOW (A) YELLOW   APPearance CLEAR (A) CLEAR   Specific Gravity, Urine 1.017 1.005 - 1.030   pH 6.0 5.0 - 8.0   Glucose, UA NEGATIVE NEGATIVE mg/dL   Hgb urine dipstick NEGATIVE NEGATIVE   Bilirubin Urine NEGATIVE NEGATIVE   Ketones, ur 20 (A) NEGATIVE mg/dL   Protein, ur 30 (A) NEGATIVE mg/dL   Nitrite NEGATIVE NEGATIVE   Leukocytes, UA NEGATIVE NEGATIVE   RBC / HPF 0-5 0 - 5 RBC/hpf   WBC, UA 0-5 0 - 5 WBC/hpf   Bacteria, UA NONE SEEN NONE SEEN   Squamous Epithelial / LPF 0-5 0 - 5   Mucus PRESENT    Hyaline Casts, UA PRESENT     Comment: Performed at [facility], [address] [place], [place] [address]  Troponin I - ONCE - STAT     Status: None   Collection Time: 02/05/18  8:38 PM  Result Value Ref Range   Troponin I <0.03 <0.03 ng/mL    Comment: Performed at [facility], [address] [place], [place] [address]   Dg Chest 1 View  Result Date: 02/05/2018 CLINICAL DATA:  Epigastric pain. EXAM: CHEST  1 VIEW COMPARISON:  01/21/2018 and 09/07/2017 FINDINGS: Patient is rotated to the left. Lungs are adequately inflated without focal airspace consolidation, effusion or pneumothorax. Mild stable cardiomegaly. Remainder of the exam is unchanged. IMPRESSION: No acute findings. Mild stable cardiomegaly. Electronically Signed   By: Elberta Fortisaniel  Boyle M.D.   On: 02/05/2018 18:10   Ct Head Wo Contrast  Result Date: 02/05/2018 CLINICAL DATA:  Severe headache with emesis and generalized weakness today. Ventricles, cisterns and other CSF spaces are normal. There is no mass, mass effect, shift of midline structures or acute hemorrhage. EXAM: CT HEAD WITHOUT CONTRAST TECHNIQUE: Contiguous axial images were obtained from the base of the skull through the vertex without intravenous contrast. COMPARISON:  08/06/2017 FINDINGS: Brain: No evidence of acute infarction, hemorrhage, hydrocephalus, extra-axial collection or mass lesion/mass effect. Subtle  chronic ischemic microvascular disease. Vascular: No hyperdense vessel or unexpected calcification. Skull: Normal. Negative for fracture or focal lesion. Sinuses/Orbits: No acute finding. Other: None. IMPRESSION: No acute findings. Subtle chronic ischemic microvascular disease. Electronically Signed   By: Elberta Fortisaniel  Boyle M.D.   On: 02/05/2018 18:20   Ct Abdomen Pelvis W Contrast  Result Date: 02/05/2018 CLINICAL DATA:  Acute generalized abdominal pain, severe headache, vomiting, generalized weakness, history GERD, COPD, coronary artery disease, hypertension EXAM: CT ABDOMEN AND PELVIS WITH CONTRAST TECHNIQUE: Multidetector CT imaging of the abdomen and pelvis was performed using the standard protocol following bolus administration of intravenous contrast. Sagittal and coronal MPR images reconstructed from axial data set. CONTRAST:  75mL OMNIPAQUE IOHEXOL 300 MG/ML SOLN IV. No oral contrast. COMPARISON:  03/09/2016 FINDINGS: Lower chest: Bibasilar atelectasis Hepatobiliary: Gallbladder surgically absent. Tiny hepatic cyst. Minimal biliary dilatation, unchanged, likely physiologic post cholecystectomy Pancreas: Ill definition of the pancreatic head and peripancreatic fat planes compared to previous study most likely representing pancreatitis. No definite pancreatic mass or ductal dilatation. Spleen: Normal appearance Adrenals/Urinary Tract: Thickening of adrenal glands without discrete mass. BILATERAL renal cysts, largest lateral mid RIGHT kidney 5.6 x 4.9 cm image 39. No definite solid renal mass intermediate attenuation RIGHT renal nodule 17 x 14 mm image 31 previously 15 x 13 mm, shows no washout of contrast or change in attenuation on delayed images favor hyperdense cyst. No  urinary tract calcification or dilatation. Bladder unremarkable. Stomach/Bowel: Appendix surgically absent by history. Small hiatal hernia. Stomach otherwise normal appearance. Large and small bowel loops unremarkable. Vascular/Lymphatic:  Atherosclerotic calcifications aorta and iliac arteries as well as coronary arteries. Aorta normal caliber. Circumaortic LEFT renal vein. Scattered pelvic phleboliths. No adenopathy. Reproductive: Uterus surgically absent with nonvisualization of ovaries. Other: No free air or free fluid.  Prior ventral hernia repair. Musculoskeletal: LEFT hip prosthesis.  Osseous demineralization. IMPRESSION: Ill-defined peripancreatic fat planes at the pancreatic head suspicious for pancreatitis; correlation with serum lipase/amylase recommended. Renal cysts with an additional intermediate attenuation RIGHT renal lesion 17 x 14 mm in size, favor hyperdense cyst; this can be confirmed by follow-up ultrasound. Extensive atherosclerotic disease. Bibasilar atelectasis. No other definite intra-abdominal abnormalities. Electronically Signed   By: Ulyses Southward M.D.   On: 02/05/2018 20:11    Pending Labs Unresulted Labs (From admission, onward)    Start     Ordered   Signed and Held  CBC  (enoxaparin (LOVENOX)    CrCl >/= 30 ml/min)  Once,   R    Comments:  Baseline for enoxaparin therapy IF NOT ALREADY DRAWN.  Notify MD if PLT < 100 K.    Signed and Held   Signed and Held  Creatinine, serum  (enoxaparin (LOVENOX)    CrCl >/= 30 ml/min)  Once,   R    Comments:  Baseline for enoxaparin therapy IF NOT ALREADY DRAWN.    Signed and Held   Signed and Held  Creatinine, serum  (enoxaparin (LOVENOX)    CrCl >/= 30 ml/min)  Weekly,   R    Comments:  while on enoxaparin therapy    Signed and Held   Signed and Held  Basic metabolic panel  Tomorrow morning,   R     Signed and Held   Signed and Held  CBC with Differential/Platelet  Tomorrow morning,   R     Signed and Held          Vitals/Pain Today's Vitals   02/05/18 2130 02/05/18 2156 02/05/18 2200 02/05/18 2230  BP: (!) 124/56  (!) 118/57 (!) 134/55  Pulse: 71  67 (!) 59  Resp: 18  17 18   Temp:      TempSrc:      SpO2: 96%  94% 93%  Weight:      Height:       PainSc:  Asleep      Isolation Precautions No active isolations  Medications Medications  sodium chloride 0.9 % bolus 500 mL (0 mLs Intravenous Stopped 02/05/18 1901)  fentaNYL (SUBLIMAZE) injection 50 mcg (50 mcg Intravenous Given 02/05/18 1830)  ondansetron (ZOFRAN) injection 4 mg (4 mg Intravenous Given 02/05/18 1831)  haloperidol lactate (HALDOL) injection 2 mg (2 mg Intravenous Given 02/05/18 1941)  iohexol (OMNIPAQUE) 300 MG/ML solution 75 mL (75 mLs Intravenous Contrast Given 02/05/18 1954)  sodium chloride 0.9 % bolus 500 mL (500 mLs Intravenous New Bag/Given 02/05/18 2117)    Mobility Uses walker, independent

## 2018-02-05 NOTE — ED Triage Notes (Signed)
Pt presents to ED c/o severe headache, emesis and generalized weakness starting today. Pt states "it feels like my head is about to bust." pt given 4mg  Zofran PTA IM by EMS.

## 2018-02-05 NOTE — ED Notes (Addendum)
O2 changed to 3L for sat 93% on 2L, sat now 96%. EDP Stafford notified.

## 2018-02-05 NOTE — ED Provider Notes (Signed)
[facility] Emergency Department Provider Note  ____________________________________________   First MD Initiated Contact with Patient 02/05/18 1754     (approximate)  I have reviewed the triage vital signs and the nursing notes.   HISTORY  Chief Complaint Emesis and Headache    HPI Sandy Daniel is a 84 y.o. female with a history of COPD, depression as well as epilepsy who was presented emergency department today with headache as well as upper abdominal pain.  Says that she awoke with all the symptoms including central chest pain which is now resolved.  States that her headache is a 10 out of 10 and feels like a tightness around her entire head.  Denies any vision change.  Says that she is also been dry heaving and has had 8 out of 10 upper, cramping abdominal pain which is nonradiating.  Does not report any burning with urination.  Denies diarrhea.  Says that she has had Tylenol as well as oxycodone earlier today without relief of her symptoms.    Past Medical History:  Diagnosis Date  . Arthritis   . Asthma   . B12 deficiency   . CAD (coronary artery disease)   . COPD (chronic obstructive pulmonary disease) (HCC)   . Depression   . Epilepsy (HCC)   . GERD (gastroesophageal reflux disease)   . Hyperlipidemia   . Hypertension   . Hypoxemia   . Macular degeneration (senile) of retina   . Macular degeneration of both eyes   . MI (myocardial infarction) (HCC) 09/2003, 05/2013  . Physiological tremor   . Presence of stent of CABG    x2   . Seizures [facility])     Patient Active Problem List   Diagnosis Date Noted  . Hip fracture (HCC) 12/01/2017  . COPD with acute exacerbation (HCC) 10/21/2017  . Cervical spondylosis without myelopathy 09/17/2017  . Disability of walking 09/17/2017  . Chronic pain syndrome 09/17/2017  . DDD (degenerative disc disease), cervical 09/17/2017  . Chest pain 09/07/2017  . Mild memory disturbances not amounting to  dementia 06/03/2017  . Major depressive disorder with single episode, in partial remission (HCC) 05/15/2017  . Neck pain 01/31/2017  . Chronic right shoulder pain 01/31/2017  . Chronic bilateral thoracic back pain 12/04/2016  . Muscle spasm of back 10/22/2016  . (HFpEF) heart failure with preserved ejection fraction (HCC) 09/11/2016  . Anxiety 07/16/2016  . Chronic bilateral low back pain 07/16/2016  . Unstable gait 07/16/2016  . Controlled substance agreement signed 03/27/2016  . Dysphagia 03/26/2016  . Panic disorder 03/26/2016  . CAD (coronary artery disease), native coronary artery   . Seizure disorder (HCC)   . Macular degeneration of both eyes   . B12 deficiency   . COPD (chronic obstructive pulmonary disease) (HCC)   . HTN (hypertension)   . Hyperlipidemia   . Arthritis   . Depression   . GERD (gastroesophageal reflux disease)   . Physiological tremor 02/18/2014    Past Surgical History:  Procedure Laterality Date  . ABDOMINAL HYSTERECTOMY    . APPENDECTOMY    . CARDIAC CATHETERIZATION  05/2013  . CARDIAC SURGERY    . CATARACT EXTRACTION  03/2016  . CHOLECYSTECTOMY  1962  . CORONARY ANGIOPLASTY WITH STENT PLACEMENT  Aug. 2005 and March 2015  . INTRAMEDULLARY (IM) NAIL INTERTROCHANTERIC Left 12/02/2017   Procedure: INTRAMEDULLARY (IM) NAIL INTERTROCHANTRIC- LEFT TFNA;  Surgeon: Lyndle Herrlich, MD;  Location: [facility] ORS;  Service: Orthopedics;  Laterality: Left;  .  ROTATOR CUFF REPAIR Right   . VENTRAL HERNIA REPAIR  2002    Prior to Admission medications   Medication Sig Start Date End Date Taking? Authorizing Provider  acetaminophen (TYLENOL) 325 MG tablet Take 2 tablets (650 mg total) by mouth every 6 (six) hours as needed for mild pain (or Fever >/= 101). 12/04/17  Yes Gouru, Deanna Artis, MD  albuterol (PROVENTIL HFA;VENTOLIN HFA) 108 (90 Base) MCG/ACT inhaler Inhale 2 puffs into the lungs every 6 (six) hours as needed for wheezing or shortness of breath. 08/13/17  Yes  Galen Manila, NP  albuterol (PROVENTIL) (2.5 MG/3ML) 0.083% nebulizer solution Take 3 mLs (2.5 mg total) by nebulization every 6 (six) hours as needed for Wheezing. 08/13/17  Yes Galen Manila, NP  aspirin 81 MG tablet Take 1 tablet (81 mg total) by mouth daily. 11/08/17  Yes Galen Manila, NP  atorvastatin (LIPITOR) 10 MG tablet Take 1 tablet (10 mg total) by mouth daily. 08/13/17  Yes Galen Manila, NP  furosemide (LASIX) 20 MG tablet Take 0.5 tablets (10 mg total) by mouth daily. 08/13/17  Yes Galen Manila, NP  gabapentin (NEURONTIN) 400 MG capsule Take 2 capsules (800 mg total) by mouth 3 (three) times daily. 08/13/17  Yes Galen Manila, NP  ipratropium-albuterol (DUONEB) 0.5-2.5 (3) MG/3ML SOLN Inhale 3 mLs into the lungs 4 (four) times daily as needed for wheezing.   Yes [provider]  nitroGLYCERIN (NITROSTAT) 0.4 MG SL tablet Place 1 tablet (0.4 mg total) under the tongue every 5 (five) minutes as needed for chest pain. 06/12/17  Yes Galen Manila, NP  pantoprazole (PROTONIX) 40 MG tablet Take 1 tablet (40 mg total) by mouth daily. 08/13/17  Yes Galen Manila, NP  phenytoin (DILANTIN) 100 MG ER capsule TAKE 2 CAPSULES (200MG ) BY MOUTH AT BEDTIME Patient taking differently: Take 200 mg by mouth at bedtime.  09/20/17  Yes Galen Manila, NP  QUEtiapine (SEROQUEL) 50 MG tablet Take 1 tablet (50 mg total) by mouth at bedtime. 08/13/17  Yes Galen Manila, NP  sertraline (ZOLOFT) 100 MG tablet Take 1 tablet (100 mg total) by mouth daily. 08/13/17  Yes Galen Manila, NP  traZODone (DESYREL) 50 MG tablet Take 50 mg by mouth at bedtime as needed for sleep.   Yes [provider]  busPIRone (BUSPAR) 10 MG tablet Take 1 tablet (10 mg total) by mouth 2 (two) times daily. Patient not taking: Reported on 02/05/2018 08/13/17   Galen Manila, NP  enoxaparin (LOVENOX) 40 MG/0.4ML injection Inject 0.4 mLs (40 mg  total) into the skin daily. Patient not taking: Reported on 02/05/2018 12/05/17   Ramonita Lab, MD  ferrous sulfate 325 (65 FE) MG tablet Take 1 tablet (325 mg total) by mouth 2 (two) times daily with a meal. Patient not taking: Reported on 02/05/2018 12/06/17   Ramonita Lab, MD  Fluticasone-Salmeterol (ADVAIR) 100-50 MCG/DOSE AEPB Inhale 1 puff into the lungs 2 (two) times daily. Patient not taking: Reported on 02/05/2018 08/13/17   Galen Manila, NP  HYDROcodone-acetaminophen (NORCO/VICODIN) 5-325 MG tablet Take 1-2 tablets by mouth every 6 (six) hours as needed for moderate pain. Patient not taking: Reported on 02/05/2018 12/04/17   Ramonita Lab, MD  lidocaine (LIDODERM) 5 % Place 1 patch onto the skin daily. Remove & Discard patch within 12 hours or as directed by MD Patient not taking: Reported on 02/05/2018 12/05/17   Ramonita Lab, MD  magnesium hydroxide (MILK OF MAGNESIA)  400 MG/5ML suspension Take 30 mLs by mouth 2 (two) times daily. Patient not taking: Reported on 02/05/2018 12/06/17   Ramonita LabGouru, Aruna, MD  nicotine (NICODERM CQ - DOSED IN MG/24 HOURS) 21 mg/24hr patch Place 1 patch (21 mg total) onto the skin daily. Patient not taking: Reported on 02/05/2018 12/04/17   Ramonita LabGouru, Aruna, MD  polyethylene glycol (MIRALAX / GLYCOLAX) packet Take 17 g by mouth daily. Patient not taking: Reported on 02/05/2018 12/04/17   Ramonita LabGouru, Aruna, MD    Allergies Motrin [ibuprofen]; Tolmetin; Alendronate; Other; and Tramadol hcl  Family History  Problem Relation Age of Onset  . Diabetes Mother   . Cancer Sister   . Diabetes Sister   . Heart disease Sister   . Heart attack Sister   . Hyperlipidemia Sister   . Hypertension Sister   . Cancer Brother   . Diabetes Brother   . Heart disease Brother   . Heart attack Brother   . Hyperlipidemia Brother   . Hypertension Brother   . Diabetes Brother   . Diabetes Brother   . Kidney failure Brother     Social History Social History   Tobacco  Use  . Smoking status: Current Every Day Smoker    Packs/day: 1.00    Years: 50.00    Pack years: 50.00    Types: Cigarettes  . Smokeless tobacco: Never Used  . Tobacco comment: 10 a day   Substance Use Topics  . Alcohol use: No  . Drug use: No    Review of Systems  Constitutional: No fever/chills Eyes: No visual changes. ENT: No sore throat. Cardiovascular: As above Respiratory: Denies shortness of breath. Gastrointestinal: No diarrhea.  No constipation. Genitourinary: Negative for dysuria. Musculoskeletal: Negative for back pain. Skin: Negative for rash. Neurological: Negative focal weakness or numbness.   ____________________________________________   PHYSICAL EXAM:  VITAL SIGNS: ED Triage Vitals [02/05/18 1806]  Enc Vitals Group     BP      Pulse      Resp      Temp      Temp src      SpO2      Weight 140 lb (63.5 kg)     Height 5' (1.524 m)     Head Circumference      Peak Flow      Pain Score 10     Pain Loc      Pain Edu?      Excl. in GC?     Constitutional: Alert and oriented.  Appears uncomfortable. Eyes: Conjunctivae are normal.  Head: Atraumatic.  No tenderness or nodularity along the distribution of the temporal arteries. Nose: No congestion/rhinnorhea. Mouth/Throat: Mucous membranes are moist.  Neck: No stridor.  No meningismus.  Ranging head neck freely. Cardiovascular: Normal rate, regular rhythm. Grossly normal heart sounds.   Respiratory: Normal respiratory effort.  No retractions. Lungs CTAB. Gastrointestinal: Soft with moderate epigastric tenderness to palpation without any rebound or guarding.  Negative Murphy sign.  Minimal left upper quadrant tenderness. No distention. No CVA tenderness. Musculoskeletal: No lower extremity tenderness nor edema.  No joint effusions. Neurologic:  Normal speech and language. No gross focal neurologic deficits are appreciated. Skin:  Skin is warm, dry and intact. No rash noted. Psychiatric: Mood and  affect are normal. Speech and behavior are normal.  ____________________________________________   LABS (all labs ordered are listed, but only abnormal results are displayed)  Labs Reviewed  COMPREHENSIVE METABOLIC PANEL - Abnormal; Notable for the following components:  Result Value   Alkaline Phosphatase 193 (*)    All other components within normal limits  URINALYSIS, COMPLETE (UACMP) WITH MICROSCOPIC - Abnormal; Notable for the following components:   Color, Urine YELLOW (*)    APPearance CLEAR (*)    Ketones, ur 20 (*)    Protein, ur 30 (*)    All other components within normal limits  SEDIMENTATION RATE - Abnormal; Notable for the following components:   Sed Rate 53 (*)    All other components within normal limits  PHENYTOIN LEVEL, TOTAL  CBC WITH DIFFERENTIAL/PLATELET  LIPASE, BLOOD  TROPONIN I  TROPONIN I   ____________________________________________  EKG  ED ECG REPORT I, Arelia Longest, the attending physician, personally viewed and interpreted this ECG.   Date: 02/05/2018  EKG Time: 1804  Rate: 87  Rhythm: normal sinus rhythm with PACs.  Multiple QRSs after P wave seen in the rhythm strip, lead II at the bottom.  Axis: Normal  Intervals:nonspecific intraventricular conduction delay  ST&T Change: No ST segment elevation or depression.  No abnormal T wave inversion.  ____________________________________________  RADIOLOGY  CT head without acute findings.  Chest x-ray without acute findings.  ____________________________________________   PROCEDURES  Procedure(s) performed: None  Procedures  Critical Care performed: No  ____________________________________________   INITIAL IMPRESSION / ASSESSMENT AND PLAN / ED COURSE  Pertinent labs & imaging results that were available during my care of the patient were reviewed by me and considered in my medical decision making (see chart for details).  Differential diagnosis includes, but is not  limited to, biliary disease (biliary colic, acute cholecystitis, cholangitis, choledocholithiasis, etc), intrathoracic causes for epigastric abdominal pain including ACS, gastritis, duodenitis, pancreatitis, small bowel or large bowel obstruction, abdominal aortic aneurysm, hernia, and ulcer(s). Differential diagnosis includes, but is not limited to, intracranial hemorrhage, meningitis/encephalitis, previous head trauma, cavernous venous thrombosis, tension headache, temporal arteritis, migraine or migraine equivalent, idiopathic intracranial hypertension, and non-specific headache. As part of my medical decision making, I reviewed the following data within the electronic MEDICAL RECORD NUMBER Notes from prior ED visits  ----------------------------------------- 8:12 PM on 02/05/2018 -----------------------------------------  Patient said that she received no relief with morphine.  Persistent headache as well as abdominal pain.  Reassuring head CT.  Reassuring chest x-ray.  Reassuring lab work as well.  Sedimentation rate slightly elevated.  However, no visual changes.  No tenderness along the distribution of the temporal arteries.  Because of the patient's age as well as abdominal pain and persistent symptoms she will have her abdomen scanned.  Patient will receive 2 mg of IV Haldol as well for nausea as well as headache.  Patient to be reevaluated by Dr. Scotty Court.  Signed out to Dr. Scotty Court.  Patient without any meningismus.  Reassuring head CT.  Unlikely to be intracranial hemorrhage. ____________________________________________   FINAL CLINICAL IMPRESSION(S) / ED DIAGNOSES  Abdominal pain.  Chest pain.  Headache.  NEW MEDICATIONS STARTED DURING THIS VISIT:  New Prescriptions   No medications on file     Note:  This document was prepared using Dragon voice recognition software and may include unintentional dictation errors.     Myrna Blazer, MD 02/05/18 2014

## 2018-02-05 NOTE — ED Notes (Signed)
Pt placed on 2L O2 for sat 88% on RA.

## 2018-02-05 NOTE — ED Provider Notes (Signed)
-----------------------------------------   9:26 PM on 02/05/2018 ----------------------------------------- Assumed care from Dr. Clearnce Hasten.  ESR is 53, nonspecific especially for an 84 year old.  CT scan does show changes of pancreatitis despite the normal lipase.  Given her age and comorbidities, warrants in-hospital observation for further control of her abdominal pain and vomiting, n.p.o./bowel rest, and hydration.   Carrie Mew, MD 02/05/18 2126

## 2018-02-05 NOTE — H&P (Signed)
[facility] [place] [facility]   PATIENT NAME: Sandy Daniel    MR#:  [id number]  DATE OF BIRTH:  1928/08/06  DATE OF ADMISSION:  02/05/2018  PRIMARY CARE PHYSICIAN: Galen Manila, NP   REQUESTING/REFERRING PHYSICIAN: Scotty Court, MD  CHIEF COMPLAINT:   Chief Complaint  Patient presents with  . Emesis  . Headache    HISTORY OF PRESENT ILLNESS:  Sandy Daniel  is a 83 y.o. female who presents with chief complaint as above.  Patient presents after 36 hours of abdominal pain with nausea.  She states that she has had decreased appetite for the past 3 days.  She denies any diarrhea or overt vomiting.  Her evaluation here initially is consistent with dehydration.  She has CT imaging done which raises concern for possible pancreatitis, though her lipase is normal.  Differential shows lymphocytosis without an absolute leukocytosis.  Hospitalist were called for admission  PAST MEDICAL HISTORY:   Past Medical History:  Diagnosis Date  . Arthritis   . Asthma   . B12 deficiency   . CAD (coronary artery disease)   . COPD (chronic obstructive pulmonary disease) (HCC)   . Depression   . Epilepsy (HCC)   . GERD (gastroesophageal reflux disease)   . Hyperlipidemia   . Hypertension   . Hypoxemia   . Macular degeneration (senile) of retina   . Macular degeneration of both eyes   . MI (myocardial infarction) (HCC) 09/2003, 05/2013  . Physiological tremor   . Presence of stent of CABG    x2   . Seizures (HCC)      PAST SURGICAL HISTORY:   Past Surgical History:  Procedure Laterality Date  . ABDOMINAL HYSTERECTOMY    . APPENDECTOMY    . CARDIAC CATHETERIZATION  05/2013  . CARDIAC SURGERY    . CATARACT EXTRACTION  03/2016  . CHOLECYSTECTOMY  1962  . CORONARY ANGIOPLASTY WITH STENT PLACEMENT  Aug. 2005 and March 2015  . INTRAMEDULLARY (IM) NAIL INTERTROCHANTERIC Left 12/02/2017   Procedure: INTRAMEDULLARY (IM) NAIL INTERTROCHANTRIC- LEFT  [facility];  Surgeon: Lyndle Herrlich, MD;  Location: [facility] ORS;  Service: Orthopedics;  Laterality: Left;  . ROTATOR CUFF REPAIR Right   . VENTRAL HERNIA REPAIR  2002     SOCIAL HISTORY:   Social History   Tobacco Use  . Smoking status: Current Every Day Smoker    Packs/day: 1.00    Years: 50.00    Pack years: 50.00    Types: Cigarettes  . Smokeless tobacco: Never Used  . Tobacco comment: 10 a day   Substance Use Topics  . Alcohol use: No     FAMILY HISTORY:   Family History  Problem Relation Age of Onset  . Diabetes Mother   . Cancer Sister   . Diabetes Sister   . Heart disease Sister   . Heart attack Sister   . Hyperlipidemia Sister   . Hypertension Sister   . Cancer Brother   . Diabetes Brother   . Heart disease Brother   . Heart attack Brother   . Hyperlipidemia Brother   . Hypertension Brother   . Diabetes Brother   . Diabetes Brother   . Kidney failure Brother      DRUG ALLERGIES:   Allergies  Allergen Reactions  . Motrin [Ibuprofen] Shortness Of Breath and Swelling    Swelling of lip/tongue  . Tolmetin Rash  . Alendronate     Other reaction(s): Nausea And Vomiting Other reaction(s): Nausea And  Vomiting  . Other Rash    Any Steriods. Causes mouth sores  . Tramadol Hcl Nausea And Vomiting    MEDICATIONS AT HOME:   Prior to Admission medications   Medication Sig Start Date End Date Taking? Authorizing Provider  acetaminophen (TYLENOL) 325 MG tablet Take 2 tablets (650 mg total) by mouth every 6 (six) hours as needed for mild pain (or Fever >/= 101). 12/04/17  Yes Gouru, Deanna Artis, MD  albuterol (PROVENTIL HFA;VENTOLIN HFA) 108 (90 Base) MCG/ACT inhaler Inhale 2 puffs into the lungs every 6 (six) hours as needed for wheezing or shortness of breath. 08/13/17  Yes Galen Manila, NP  albuterol (PROVENTIL) (2.5 MG/3ML) 0.083% nebulizer solution Take 3 mLs (2.5 mg total) by nebulization every 6 (six) hours as needed for Wheezing. 08/13/17  Yes Galen Manila, NP  aspirin 81 MG tablet Take 1 tablet (81 mg total) by mouth daily. 11/08/17  Yes Galen Manila, NP  atorvastatin (LIPITOR) 10 MG tablet Take 1 tablet (10 mg total) by mouth daily. 08/13/17  Yes Galen Manila, NP  furosemide (LASIX) 20 MG tablet Take 0.5 tablets (10 mg total) by mouth daily. 08/13/17  Yes Galen Manila, NP  gabapentin (NEURONTIN) 400 MG capsule Take 2 capsules (800 mg total) by mouth 3 (three) times daily. 08/13/17  Yes Galen Manila, NP  ipratropium-albuterol (DUONEB) 0.5-2.5 (3) MG/3ML SOLN Inhale 3 mLs into the lungs 4 (four) times daily as needed for wheezing.   Yes [provider]  nitroGLYCERIN (NITROSTAT) 0.4 MG SL tablet Place 1 tablet (0.4 mg total) under the tongue every 5 (five) minutes as needed for chest pain. 06/12/17  Yes Galen Manila, NP  pantoprazole (PROTONIX) 40 MG tablet Take 1 tablet (40 mg total) by mouth daily. 08/13/17  Yes Galen Manila, NP  phenytoin (DILANTIN) 100 MG ER capsule TAKE 2 CAPSULES (200MG ) BY MOUTH AT BEDTIME Patient taking differently: Take 200 mg by mouth at bedtime.  09/20/17  Yes Galen Manila, NP  QUEtiapine (SEROQUEL) 50 MG tablet Take 1 tablet (50 mg total) by mouth at bedtime. 08/13/17  Yes Galen Manila, NP  sertraline (ZOLOFT) 100 MG tablet Take 1 tablet (100 mg total) by mouth daily. 08/13/17  Yes Galen Manila, NP  traZODone (DESYREL) 50 MG tablet Take 50 mg by mouth at bedtime as needed for sleep.   Yes [provider]  busPIRone (BUSPAR) 10 MG tablet Take 1 tablet (10 mg total) by mouth 2 (two) times daily. Patient not taking: Reported on 02/05/2018 08/13/17   Galen Manila, NP  Fluticasone-Salmeterol (ADVAIR) 100-50 MCG/DOSE AEPB Inhale 1 puff into the lungs 2 (two) times daily. Patient not taking: Reported on 02/05/2018 08/13/17   Galen Manila, NP  HYDROcodone-acetaminophen (NORCO/VICODIN) 5-325 MG tablet Take 1-2 tablets by  mouth every 6 (six) hours as needed for moderate pain. Patient not taking: Reported on 02/05/2018 12/04/17   Ramonita Lab, MD  lidocaine (LIDODERM) 5 % Place 1 patch onto the skin daily. Remove & Discard patch within 12 hours or as directed by MD Patient not taking: Reported on 02/05/2018 12/05/17   Ramonita Lab, MD  nicotine (NICODERM CQ - DOSED IN MG/24 HOURS) 21 mg/24hr patch Place 1 patch (21 mg total) onto the skin daily. Patient not taking: Reported on 02/05/2018 12/04/17   Ramonita Lab, MD    REVIEW OF SYSTEMS:  Review of Systems  Constitutional: Negative for chills, fever, malaise/fatigue and weight loss.  HENT:  Negative for ear pain, hearing loss and tinnitus.   Eyes: Negative for blurred vision, double vision, pain and redness.  Respiratory: Negative for cough, hemoptysis and shortness of breath.   Cardiovascular: Negative for chest pain, palpitations, orthopnea and leg swelling.  Gastrointestinal: Positive for abdominal pain and nausea. Negative for constipation, diarrhea and vomiting.  Genitourinary: Negative for dysuria, frequency and hematuria.  Musculoskeletal: Negative for back pain, joint pain and neck pain.  Skin:       No acne, rash, or lesions  Neurological: Negative for dizziness, tremors, focal weakness and weakness.  Endo/Heme/Allergies: Negative for polydipsia. Does not bruise/bleed easily.  Psychiatric/Behavioral: Negative for depression. The patient is not nervous/anxious and does not have insomnia.      VITAL SIGNS:   Vitals:   02/05/18 2036 02/05/18 2042 02/05/18 2100 02/05/18 2102  BP:   131/68   Pulse: (!) 102 (!) 106 75 77  Resp: (!) 22 19 19 20   Temp:      TempSrc:      SpO2: 93% 96% 92% 95%  Weight:      Height:       Wt Readings from Last 3 Encounters:  02/05/18 63.5 kg  01/20/18 65 kg  12/02/17 64.4 kg    PHYSICAL EXAMINATION:  Physical Exam  Vitals reviewed. Constitutional: She is oriented to person, place, and time. She appears  well-developed and well-nourished. No distress.  HENT:  Head: Normocephalic and atraumatic.  Dry mucous membranes  Eyes: Pupils are equal, round, and reactive to light. Conjunctivae and EOM are normal. No scleral icterus.  Neck: Normal range of motion. Neck supple. No JVD present. No thyromegaly present.  Cardiovascular: Normal rate and intact distal pulses. Exam reveals no gallop and no friction rub.  No murmur heard. Irregular rhythm  Respiratory: Effort normal and breath sounds normal. No respiratory distress. She has no wheezes. She has no rales.  GI: Soft. Bowel sounds are normal. She exhibits no distension. There is abdominal tenderness.  Musculoskeletal: Normal range of motion.        General: No edema.     Comments: No arthritis, no gout  Lymphadenopathy:    She has no cervical adenopathy.  Neurological: She is alert and oriented to person, place, and time. No cranial nerve deficit.  No dysarthria, no aphasia  Skin: Skin is warm and dry. No rash noted. No erythema.  Psychiatric: She has a normal mood and affect. Her behavior is normal. Judgment and thought content normal.    LABORATORY PANEL:   CBC Recent Labs  Lab 02/05/18 1809  WBC 5.1  HGB 13.9  HCT 42.9  PLT 300   ------------------------------------------------------------------------------------------------------------------  Chemistries  Recent Labs  Lab 02/05/18 1809  NA 139  K 4.1  CL 103  CO2 25  GLUCOSE 99  BUN 23  CREATININE 0.63  CALCIUM 9.1  AST 30  ALT 22  ALKPHOS 193*  BILITOT 0.6   ------------------------------------------------------------------------------------------------------------------  Cardiac Enzymes Recent Labs  Lab 02/05/18 2038  TROPONINI <0.03   ------------------------------------------------------------------------------------------------------------------  RADIOLOGY:  Dg Chest 1 View  Result Date: 02/05/2018 CLINICAL DATA:  Epigastric pain. EXAM: CHEST  1  VIEW COMPARISON:  01/21/2018 and 09/07/2017 FINDINGS: Patient is rotated to the left. Lungs are adequately inflated without focal airspace consolidation, effusion or pneumothorax. Mild stable cardiomegaly. Remainder of the exam is unchanged. IMPRESSION: No acute findings. Mild stable cardiomegaly. Electronically Signed   By: Elberta Fortis M.D.   On: 02/05/2018 18:10   Ct Head Wo Contrast  Result Date: 02/05/2018 CLINICAL DATA:  Severe headache with emesis and generalized weakness today. Ventricles, cisterns and other CSF spaces are normal. There is no mass, mass effect, shift of midline structures or acute hemorrhage. EXAM: CT HEAD WITHOUT CONTRAST TECHNIQUE: Contiguous axial images were obtained from the base of the skull through the vertex without intravenous contrast. COMPARISON:  08/06/2017 FINDINGS: Brain: No evidence of acute infarction, hemorrhage, hydrocephalus, extra-axial collection or mass lesion/mass effect. Subtle chronic ischemic microvascular disease. Vascular: No hyperdense vessel or unexpected calcification. Skull: Normal. Negative for fracture or focal lesion. Sinuses/Orbits: No acute finding. Other: None. IMPRESSION: No acute findings. Subtle chronic ischemic microvascular disease. Electronically Signed   By: Elberta Fortis M.D.   On: 02/05/2018 18:20   Ct Abdomen Pelvis W Contrast  Result Date: 02/05/2018 CLINICAL DATA:  Acute generalized abdominal pain, severe headache, vomiting, generalized weakness, history GERD, COPD, coronary artery disease, hypertension EXAM: CT ABDOMEN AND PELVIS WITH CONTRAST TECHNIQUE: Multidetector CT imaging of the abdomen and pelvis was performed using the standard protocol following bolus administration of intravenous contrast. Sagittal and coronal MPR images reconstructed from axial data set. CONTRAST:  75mL OMNIPAQUE IOHEXOL 300 MG/ML SOLN IV. No oral contrast. COMPARISON:  03/09/2016 FINDINGS: Lower chest: Bibasilar atelectasis Hepatobiliary: Gallbladder  surgically absent. Tiny hepatic cyst. Minimal biliary dilatation, unchanged, likely physiologic post cholecystectomy Pancreas: Ill definition of the pancreatic head and peripancreatic fat planes compared to previous study most likely representing pancreatitis. No definite pancreatic mass or ductal dilatation. Spleen: Normal appearance Adrenals/Urinary Tract: Thickening of adrenal glands without discrete mass. BILATERAL renal cysts, largest lateral mid RIGHT kidney 5.6 x 4.9 cm image 39. No definite solid renal mass intermediate attenuation RIGHT renal nodule 17 x 14 mm image 31 previously 15 x 13 mm, shows no washout of contrast or change in attenuation on delayed images favor hyperdense cyst. No urinary tract calcification or dilatation. Bladder unremarkable. Stomach/Bowel: Appendix surgically absent by history. Small hiatal hernia. Stomach otherwise normal appearance. Large and small bowel loops unremarkable. Vascular/Lymphatic: Atherosclerotic calcifications aorta and iliac arteries as well as coronary arteries. Aorta normal caliber. Circumaortic LEFT renal vein. Scattered pelvic phleboliths. No adenopathy. Reproductive: Uterus surgically absent with nonvisualization of ovaries. Other: No free air or free fluid.  Prior ventral hernia repair. Musculoskeletal: LEFT hip prosthesis.  Osseous demineralization. IMPRESSION: Ill-defined peripancreatic fat planes at the pancreatic head suspicious for pancreatitis; correlation with serum lipase/amylase recommended. Renal cysts with an additional intermediate attenuation RIGHT renal lesion 17 x 14 mm in size, favor hyperdense cyst; this can be confirmed by follow-up ultrasound. Extensive atherosclerotic disease. Bibasilar atelectasis. No other definite intra-abdominal abnormalities. Electronically Signed   By: Ulyses Southward M.D.   On: 02/05/2018 20:11    EKG:   Orders placed or performed during the hospital encounter of 01/20/18  . ED EKG  . ED EKG  . EKG 12-Lead  .  EKG 12-Lead  . EKG    IMPRESSION AND PLAN:  Principal Problem:   Abdominal pain -unclear absolute etiology.  CT imaging raises concern for possible pancreatitis, though she has a normal lipase and no prior history of pancreatitis.  Differential on her CBC seems to indicate possible viral process.  We will keep her n.p.o. for now, hydrate her with IV fluids, and monitor for improvement Active Problems:   CAD (coronary artery disease), native coronary artery -continue home medications   COPD (chronic obstructive pulmonary disease) (HCC) -home dose inhalers   HTN (hypertension) -continue home meds   (HFpEF) heart failure with  preserved ejection fraction (HCC) -continue home medications, cautious administration of IV fluids as above   Seizure disorder (HCC) -home dose antiepileptic   Hyperlipidemia -home dose antilipid   GERD (gastroesophageal reflux disease) -home dose PPI   Anxiety -home dose anxiolytic  Chart review performed and case discussed with ED provider. Labs, imaging and/or ECG reviewed by provider and discussed with patient/family. Management plans discussed with the patient and/or family.  DVT PROPHYLAXIS: SubQ lovenox   GI PROPHYLAXIS:  PPI   ADMISSION STATUS: Inpatient     CODE STATUS: DNR Code Status History    Date Active Date Inactive Code Status Order ID Comments User Context   12/01/2017 1838 12/06/2017 2035 DNR [id number]  Ihor AustinPyreddy, Pavan, MD Inpatient   10/22/2017 0954 10/27/2017 2328 DNR [id number]  Ramonita LabGouru, Aruna, MD Inpatient   10/22/2017 0107 10/22/2017 0953 Full Code [id number]  Oralia ManisWillis, Madelin Weseman, MD Inpatient   09/07/2017 1354 09/08/2017 1933 Full Code [id number]  Shaune Pollackhen, Qing, MD Inpatient   07/11/2016 1416 07/13/2016 1824 Full Code [id number]  Milagros LollSudini, Srikar, MD ED    Questions for Most Recent Historical Code Status (Order [id number])    Question Answer Comment   In the event of cardiac or respiratory ARREST Do not call a "code blue"    In the event of cardiac or respiratory  ARREST Do not perform Intubation, CPR, defibrillation or ACLS    In the event of cardiac or respiratory ARREST Use medication by any route, position, wound care, and other measures to relive pain and suffering. May use oxygen, suction and manual treatment of airway obstruction as needed for comfort.    Comments RN may pronounce       TOTAL TIME TAKING CARE OF THIS PATIENT: 45 minutes.   Sandy Daniel 02/05/2018, 9:50 PM  [facility]  Office  [phone]  CC: Primary care physician; Galen ManilaKennedy, Lauren Renee, NP  Note:  This document was prepared using Dragon voice recognition software and may include unintentional dictation errors.

## 2018-02-06 LAB — BASIC METABOLIC PANEL
Anion gap: 8 (ref 5–15)
BUN: 23 mg/dL (ref 8–23)
CO2: 26 mmol/L (ref 22–32)
Calcium: 8.3 mg/dL — ABNORMAL LOW (ref 8.9–10.3)
Chloride: 106 mmol/L (ref 98–111)
Creatinine, Ser: 0.62 mg/dL (ref 0.44–1.00)
GFR calc Af Amer: >60 mL/min (ref >60)
GFR calc non Af Amer: >60 mL/min (ref >60)
Glucose, Bld: 61 mg/dL — ABNORMAL LOW (ref 70–99)
Potassium: 3.7 mmol/L (ref 3.5–5.1)
SODIUM: 140 mmol/L (ref 135–145)

## 2018-02-06 LAB — CBC WITH DIFFERENTIAL/PLATELET
Abs Immature Granulocytes: 0.01 10*3/uL (ref 0.00–0.07)
Basophils Absolute: 0 10*3/uL (ref 0.0–0.1)
Basophils Relative: 1 %
Eosinophils Absolute: 0.1 10*3/uL (ref 0.0–0.5)
Eosinophils Relative: 3 %
HCT: 36.2 % (ref 36.0–46.0)
Hemoglobin: 11.3 g/dL — ABNORMAL LOW (ref 12.0–15.0)
Immature Granulocytes: 0 %
Lymphocytes Relative: 42 %
Lymphs Abs: 1.9 10*3/uL (ref 0.7–4.0)
MCH: 31.1 pg (ref 26.0–34.0)
MCHC: 31.2 g/dL (ref 30.0–36.0)
MCV: 99.7 fL (ref 80.0–100.0)
MONO ABS: 0.6 10*3/uL (ref 0.1–1.0)
Monocytes Relative: 13 %
Neutro Abs: 1.8 10*3/uL (ref 1.7–7.7)
Neutrophils Relative %: 41 %
Platelets: 253 10*3/uL (ref 150–400)
RBC: 3.63 MIL/uL — ABNORMAL LOW (ref 3.87–5.11)
RDW: 14.6 % (ref 11.5–15.5)
WBC: 4.5 10*3/uL (ref 4.0–10.5)
nRBC: 0 % (ref 0.0–0.2)

## 2018-02-06 LAB — MRSA PCR SCREENING: MRSA by PCR: NEGATIVE

## 2018-02-06 MED ORDER — ORAL CARE MOUTH RINSE
15.0000 mL | Freq: Two times a day (BID) | OROMUCOSAL | Status: DC
  Administered 2018-02-06: 10:00:00 15 mL via OROMUCOSAL

## 2018-02-06 MED ORDER — NICOTINE 21 MG/24HR TD PT24: 21.0000 mg | MEDICATED_PATCH | Freq: Every day | TRANSDERMAL | 0 refills | Status: AC

## 2018-02-06 MED ORDER — PHENYTOIN SODIUM EXTENDED 100 MG PO CAPS: 200.0000 mg | ORAL_CAPSULE | Freq: Every day | ORAL | Status: AC

## 2018-02-06 NOTE — Discharge Summary (Signed)
[facility] [place] [facility]   PATIENT NAME: Sandy BushyMargaret Daniel    MR#:  [id number]  DATE OF BIRTH:  May 14, 1928  DATE OF ADMISSION:  02/05/2018 ADMITTING PHYSICIAN: Oralia Manisavid Willis, MD  DATE OF DISCHARGE: 02/06/2018  PRIMARY CARE PHYSICIAN: Galen ManilaKennedy, Lauren Renee, NP    ADMISSION DIAGNOSIS:  Upper abdominal pain [R10.10] Intractable headache, unspecified chronicity pattern, unspecified headache type [R51] Chest pain, unspecified type [R07.9] Acute pancreatitis without infection or necrosis, unspecified pancreatitis type [K85.90]  DISCHARGE DIAGNOSIS:  Principal Problem:   Abdominal pain Active Problems:   CAD (coronary artery disease), native coronary artery   Seizure disorder (HCC)   COPD (chronic obstructive pulmonary disease) (HCC)   HTN (hypertension)   Hyperlipidemia   GERD (gastroesophageal reflux disease)   Anxiety   (HFpEF) heart failure with preserved ejection fraction (HCC)   Chronic pain syndrome   SECONDARY DIAGNOSIS:   Past Medical History:  Diagnosis Date  . Arthritis   . Asthma   . B12 deficiency   . CAD (coronary artery disease)   . COPD (chronic obstructive pulmonary disease) (HCC)   . Depression   . Epilepsy (HCC)   . GERD (gastroesophageal reflux disease)   . Hyperlipidemia   . Hypertension   . Hypoxemia   . Macular degeneration (senile) of retina   . Macular degeneration of both eyes   . MI (myocardial infarction) (HCC) 09/2003, 05/2013  . Physiological tremor   . Presence of stent of CABG    x2   . Seizures [facility](HCC)     HOSPITAL COURSE:  84 year old female with history of tobacco dependence and hyperlipidemia presents with abdominal pain.  1.  Abdominal pain: Patient underwent CT scan which shows possible pancreatitis however lipase level was normal. She was able to tolerate her diet.  She had no abdominal pain upon discharge.  2.Tobacco dependence: Patient is encouraged to quit smoking. Counseling was provided for 4  minutes.   3.  Hyperlipidemia: Continue statin  4.  Seizure disorder: Continue Dilantin  .  COPD without signs of exacerbation: Patient will continue outpatient inhalers DISCHARGE CONDITIONS AND DIET:   Stable for discharge on regular diet  CONSULTS OBTAINED:    DRUG ALLERGIES:   Allergies  Allergen Reactions  . Motrin [Ibuprofen] Shortness Of Breath and Swelling    Swelling of lip/tongue  . Tolmetin Rash  . Alendronate     Other reaction(s): Nausea And Vomiting Other reaction(s): Nausea And Vomiting  . Other Rash    Any Steriods. Causes mouth sores  . Tramadol Hcl Nausea And Vomiting    DISCHARGE MEDICATIONS:   Allergies as of 02/06/2018      Reactions   Motrin [ibuprofen] Shortness Of Breath, Swelling   Swelling of lip/tongue   Tolmetin Rash   Alendronate    Other reaction(s): Nausea And Vomiting Other reaction(s): Nausea And Vomiting   Other Rash   Any Steriods. Causes mouth sores   Tramadol Hcl Nausea And Vomiting      Medication List    STOP taking these medications   busPIRone 10 MG tablet Commonly known as:  BUSPAR   Fluticasone-Salmeterol 100-50 MCG/DOSE Aepb Commonly known as:  ADVAIR   HYDROcodone-acetaminophen 5-325 MG tablet Commonly known as:  NORCO/VICODIN   lidocaine 5 % Commonly known as:  LIDODERM     TAKE these medications   acetaminophen 325 MG tablet Commonly known as:  TYLENOL Take 2 tablets (650 mg total) by mouth every 6 (six) hours as needed for mild pain (  or Fever >/= 101).   albuterol 108 (90 Base) MCG/ACT inhaler Commonly known as:  PROVENTIL HFA;VENTOLIN HFA Inhale 2 puffs into the lungs every 6 (six) hours as needed for wheezing or shortness of breath.   albuterol (2.5 MG/3ML) 0.083% nebulizer solution Commonly known as:  PROVENTIL Take 3 mLs (2.5 mg total) by nebulization every 6 (six) hours as needed for Wheezing.   aspirin 81 MG tablet Take 1 tablet (81 mg total) by mouth daily.   atorvastatin 10 MG  tablet Commonly known as:  LIPITOR Take 1 tablet (10 mg total) by mouth daily.   furosemide 20 MG tablet Commonly known as:  LASIX Take 0.5 tablets (10 mg total) by mouth daily.   gabapentin 400 MG capsule Commonly known as:  NEURONTIN Take 2 capsules (800 mg total) by mouth 3 (three) times daily.   ipratropium-albuterol 0.5-2.5 (3) MG/3ML Soln Commonly known as:  DUONEB Inhale 3 mLs into the lungs 4 (four) times daily as needed for wheezing.   nicotine 21 mg/24hr patch Commonly known as:  NICODERM CQ - dosed in mg/24 hours Place 1 patch (21 mg total) onto the skin daily.   nitroGLYCERIN 0.4 MG SL tablet Commonly known as:  NITROSTAT Place 1 tablet (0.4 mg total) under the tongue every 5 (five) minutes as needed for chest pain.   pantoprazole 40 MG tablet Commonly known as:  PROTONIX Take 1 tablet (40 mg total) by mouth daily.   phenytoin 100 MG ER capsule Commonly known as:  DILANTIN Take 2 capsules (200 mg total) by mouth at bedtime. What changed:  See the new instructions.   QUEtiapine 50 MG tablet Commonly known as:  SEROQUEL Take 1 tablet (50 mg total) by mouth at bedtime.   sertraline 100 MG tablet Commonly known as:  ZOLOFT Take 1 tablet (100 mg total) by mouth daily.   traZODone 50 MG tablet Commonly known as:  DESYREL Take 50 mg by mouth at bedtime as needed for sleep.         Today   CHIEF COMPLAINT:  Patient doing well without abdominal pain this morning.   VITAL SIGNS:  Blood pressure (!) 110/47, pulse 60, temperature 98.3 F (36.8 C), temperature source Axillary, resp. rate 18, height 5' (1.524 m), weight 66.6 kg, SpO2 94 %.   REVIEW OF SYSTEMS:  Review of Systems  Constitutional: Negative.  Negative for chills, fever and malaise/fatigue.  HENT: Negative.  Negative for ear discharge, ear pain, hearing loss, nosebleeds and sore throat.   Eyes: Negative.  Negative for blurred vision and pain.  Respiratory: Negative.  Negative for cough,  hemoptysis, shortness of breath and wheezing.   Cardiovascular: Negative.  Negative for chest pain, palpitations and leg swelling.  Gastrointestinal: Negative.  Negative for abdominal pain, blood in stool, diarrhea, nausea and vomiting.  Genitourinary: Negative.  Negative for dysuria.  Musculoskeletal: Negative.  Negative for back pain.  Skin: Negative.   Neurological: Negative for dizziness, tremors, speech change, focal weakness, seizures and headaches.  Endo/Heme/Allergies: Negative.  Does not bruise/bleed easily.  Psychiatric/Behavioral: Negative.  Negative for depression, hallucinations and suicidal ideas.     PHYSICAL EXAMINATION:  GENERAL:  84 y.o.-year-old patient lying in the bed with no acute distress.  NECK:  Supple, no jugular venous distention. No thyroid enlargement, no tenderness.  LUNGS: Normal breath sounds bilaterally, no wheezing, rales,rhonchi  No use of accessory muscles of respiration.  CARDIOVASCULAR: S1, S2 normal. No murmurs, rubs, or gallops.  ABDOMEN: Soft, non-tender, non-distended. Bowel sounds  present. No organomegaly or mass.  EXTREMITIES: No pedal edema, cyanosis, or clubbing.  PSYCHIATRIC: The patient is alert and oriented x 3.  SKIN: No obvious rash, lesion, or ulcer.   DATA REVIEW:   CBC Recent Labs  Lab 02/06/18 0457  WBC 4.5  HGB 11.3*  HCT 36.2  PLT 253    Chemistries  Recent Labs  Lab 02/05/18 1809 02/06/18 0457  NA 139 140  K 4.1 3.7  CL 103 106  CO2 25 26  GLUCOSE 99 61*  BUN 23 23  CREATININE 0.63 0.62  CALCIUM 9.1 8.3*  AST 30  --   ALT 22  --   ALKPHOS 193*  --   BILITOT 0.6  --     Cardiac Enzymes Recent Labs  Lab 02/05/18 1809 02/05/18 2038  TROPONINI <0.03 <0.03    Microbiology Results  @[email or web address] @  RADIOLOGY:  Dg Chest 1 View  Result Date: 02/05/2018 CLINICAL DATA:  Epigastric pain. EXAM: CHEST  1 VIEW COMPARISON:  01/21/2018 and 09/07/2017 FINDINGS: Patient is rotated to the left. Lungs are  adequately inflated without focal airspace consolidation, effusion or pneumothorax. Mild stable cardiomegaly. Remainder of the exam is unchanged. IMPRESSION: No acute findings. Mild stable cardiomegaly. Electronically Signed   By: Elberta Fortis M.D.   On: 02/05/2018 18:10   Ct Head Wo Contrast  Result Date: 02/05/2018 CLINICAL DATA:  Severe headache with emesis and generalized weakness today. Ventricles, cisterns and other CSF spaces are normal. There is no mass, mass effect, shift of midline structures or acute hemorrhage. EXAM: CT HEAD WITHOUT CONTRAST TECHNIQUE: Contiguous axial images were obtained from the base of the skull through the vertex without intravenous contrast. COMPARISON:  08/06/2017 FINDINGS: Brain: No evidence of acute infarction, hemorrhage, hydrocephalus, extra-axial collection or mass lesion/mass effect. Subtle chronic ischemic microvascular disease. Vascular: No hyperdense vessel or unexpected calcification. Skull: Normal. Negative for fracture or focal lesion. Sinuses/Orbits: No acute finding. Other: None. IMPRESSION: No acute findings. Subtle chronic ischemic microvascular disease. Electronically Signed   By: Elberta Fortis M.D.   On: 02/05/2018 18:20   Ct Abdomen Pelvis W Contrast  Result Date: 02/05/2018 CLINICAL DATA:  Acute generalized abdominal pain, severe headache, vomiting, generalized weakness, history GERD, COPD, coronary artery disease, hypertension EXAM: CT ABDOMEN AND PELVIS WITH CONTRAST TECHNIQUE: Multidetector CT imaging of the abdomen and pelvis was performed using the standard protocol following bolus administration of intravenous contrast. Sagittal and coronal MPR images reconstructed from axial data set. CONTRAST:  75mL OMNIPAQUE IOHEXOL 300 MG/ML SOLN IV. No oral contrast. COMPARISON:  03/09/2016 FINDINGS: Lower chest: Bibasilar atelectasis Hepatobiliary: Gallbladder surgically absent. Tiny hepatic cyst. Minimal biliary dilatation, unchanged, likely physiologic  post cholecystectomy Pancreas: Ill definition of the pancreatic head and peripancreatic fat planes compared to previous study most likely representing pancreatitis. No definite pancreatic mass or ductal dilatation. Spleen: Normal appearance Adrenals/Urinary Tract: Thickening of adrenal glands without discrete mass. BILATERAL renal cysts, largest lateral mid RIGHT kidney 5.6 x 4.9 cm image 39. No definite solid renal mass intermediate attenuation RIGHT renal nodule 17 x 14 mm image 31 previously 15 x 13 mm, shows no washout of contrast or change in attenuation on delayed images favor hyperdense cyst. No urinary tract calcification or dilatation. Bladder unremarkable. Stomach/Bowel: Appendix surgically absent by history. Small hiatal hernia. Stomach otherwise normal appearance. Large and small bowel loops unremarkable. Vascular/Lymphatic: Atherosclerotic calcifications aorta and iliac arteries as well as coronary arteries. Aorta normal caliber. Circumaortic LEFT renal vein. Scattered pelvic phleboliths. No adenopathy. Reproductive:  Uterus surgically absent with nonvisualization of ovaries. Other: No free air or free fluid.  Prior ventral hernia repair. Musculoskeletal: LEFT hip prosthesis.  Osseous demineralization. IMPRESSION: Ill-defined peripancreatic fat planes at the pancreatic head suspicious for pancreatitis; correlation with serum lipase/amylase recommended. Renal cysts with an additional intermediate attenuation RIGHT renal lesion 17 x 14 mm in size, favor hyperdense cyst; this can be confirmed by follow-up ultrasound. Extensive atherosclerotic disease. Bibasilar atelectasis. No other definite intra-abdominal abnormalities. Electronically Signed   By: Ulyses Southward M.D.   On: 02/05/2018 20:11      Allergies as of 02/06/2018      Reactions   Motrin [ibuprofen] Shortness Of Breath, Swelling   Swelling of lip/tongue   Tolmetin Rash   Alendronate    Other reaction(s): Nausea And Vomiting Other  reaction(s): Nausea And Vomiting   Other Rash   Any Steriods. Causes mouth sores   Tramadol Hcl Nausea And Vomiting      Medication List    STOP taking these medications   busPIRone 10 MG tablet Commonly known as:  BUSPAR   Fluticasone-Salmeterol 100-50 MCG/DOSE Aepb Commonly known as:  ADVAIR   HYDROcodone-acetaminophen 5-325 MG tablet Commonly known as:  NORCO/VICODIN   lidocaine 5 % Commonly known as:  LIDODERM     TAKE these medications   acetaminophen 325 MG tablet Commonly known as:  TYLENOL Take 2 tablets (650 mg total) by mouth every 6 (six) hours as needed for mild pain (or Fever >/= 101).   albuterol 108 (90 Base) MCG/ACT inhaler Commonly known as:  PROVENTIL HFA;VENTOLIN HFA Inhale 2 puffs into the lungs every 6 (six) hours as needed for wheezing or shortness of breath.   albuterol (2.5 MG/3ML) 0.083% nebulizer solution Commonly known as:  PROVENTIL Take 3 mLs (2.5 mg total) by nebulization every 6 (six) hours as needed for Wheezing.   aspirin 81 MG tablet Take 1 tablet (81 mg total) by mouth daily.   atorvastatin 10 MG tablet Commonly known as:  LIPITOR Take 1 tablet (10 mg total) by mouth daily.   furosemide 20 MG tablet Commonly known as:  LASIX Take 0.5 tablets (10 mg total) by mouth daily.   gabapentin 400 MG capsule Commonly known as:  NEURONTIN Take 2 capsules (800 mg total) by mouth 3 (three) times daily.   ipratropium-albuterol 0.5-2.5 (3) MG/3ML Soln Commonly known as:  DUONEB Inhale 3 mLs into the lungs 4 (four) times daily as needed for wheezing.   nicotine 21 mg/24hr patch Commonly known as:  NICODERM CQ - dosed in mg/24 hours Place 1 patch (21 mg total) onto the skin daily.   nitroGLYCERIN 0.4 MG SL tablet Commonly known as:  NITROSTAT Place 1 tablet (0.4 mg total) under the tongue every 5 (five) minutes as needed for chest pain.   pantoprazole 40 MG tablet Commonly known as:  PROTONIX Take 1 tablet (40 mg total) by mouth  daily.   phenytoin 100 MG ER capsule Commonly known as:  DILANTIN Take 2 capsules (200 mg total) by mouth at bedtime. What changed:  See the new instructions.   QUEtiapine 50 MG tablet Commonly known as:  SEROQUEL Take 1 tablet (50 mg total) by mouth at bedtime.   sertraline 100 MG tablet Commonly known as:  ZOLOFT Take 1 tablet (100 mg total) by mouth daily.   traZODone 50 MG tablet Commonly known as:  DESYREL Take 50 mg by mouth at bedtime as needed for sleep.  Management plans discussed with the patient and she is in agreement. Stable for discharge   Patient should follow up with pcp  CODE STATUS:     Code Status Orders  (From admission, onward)         Start     Ordered   02/05/18 2311  Do not attempt resuscitation (DNR)  Continuous    Question Answer Comment  In the event of cardiac or respiratory ARREST Do not call a "code blue"   In the event of cardiac or respiratory ARREST Do not perform Intubation, CPR, defibrillation or ACLS   In the event of cardiac or respiratory ARREST Use medication by any route, position, wound care, and other measures to relive pain and suffering. May use oxygen, suction and manual treatment of airway obstruction as needed for comfort.   Comments RN may pronounce      02/05/18 2310        Code Status History    Date Active Date Inactive Code Status Order ID Comments User Context   12/01/2017 1838 12/06/2017 2035 DNR [id number]  Ihor Austin, MD Inpatient   10/22/2017 0954 10/27/2017 2328 DNR [id number]  Ramonita Lab, MD Inpatient   10/22/2017 0107 10/22/2017 0953 Full Code [id number]  Oralia Manis, MD Inpatient   09/07/2017 1354 09/08/2017 1933 Full Code [id number]  Shaune Pollack, MD Inpatient   07/11/2016 1416 07/13/2016 1824 Full Code [id number]  Milagros Loll, MD ED      TOTAL TIME TAKING CARE OF THIS PATIENT: 38 minutes.    Note: This dictation was prepared with Dragon dictation along with smaller phrase technology. Any  transcriptional errors that result from this process are unintentional.  Yailyn Strack M.D on 02/06/2018 at 10:52 AM  Between 7am to 6pm - Pager - [phone] After 6pm go to [email or web address] - Social research officer, government  [facility]  Office  [phone]  CC: Primary care physician; Galen Manila, NP

## 2018-02-06 NOTE — Care Management CC44 (Signed)
Condition Code 44 Documentation Completed  Patient Details  Name: Karlotta Mccafferty MRN: [id number] Date of Birth: [birth year]   Condition Code 44 given:  Yes Patient signature on Condition Code 44 notice:  Yes Documentation of 2 MD's agreement:  Yes Code 44 added to claim:  Yes    Allayne Butcher, RN 02/06/2018, 1:46 PM

## 2018-02-06 NOTE — Care Management Obs Status (Signed)
MEDICARE OBSERVATION STATUS NOTIFICATION   Patient Details  Name: Pavneet Lisboa MRN: [id number] Date of Birth: [birth year]   Medicare Observation Status Notification Given:  Yes    Allayne Butcher, RN 02/06/2018, 1:45 PM

## 2018-02-06 NOTE — Progress Notes (Signed)
   02/06/18 1005  Clinical Encounter Type  Visited With Patient  Visit Type Initial;Spiritual support  Referral From Nurse  Consult/Referral To Chaplain  Spiritual Encounters  Spiritual Needs Prayer   CH received an OR to pray with the patient. I reported to her room and discovered that Sandy Daniel has difficulty hearing. I was able to communicate that I was there to pray for her at her request. I prayed and departed as PT was entering the room. I will follow up as needed.

## 2018-02-06 NOTE — Progress Notes (Signed)
Pt is d/ced back to [facility].  She has had no further c/o abdominal pain.  She tolerated diet.  Pt has been very lethargic today.  Had 1 incontinent episode and then didn't void the rest of the day.  We bladder scanned her and it showed 643 cc urine.  Received order to in/out cath.  Was able to draw out 700 cc urine.  Dr. Juliene Pina didn't not want to hold up the d/c for this.  Communicated incident of urinary retention to the representative from the group home and also pt's lethargy as something to watch.  Removed IV and pt was transported by group home representative.

## 2018-02-06 NOTE — Progress Notes (Signed)
During the completion of the admission profile, it was noted that patient was recently discharged from [facility] in [place], [place] where she had been admitted for suicidal ideation. Patient was discharged 02/04/2018 to the [facility]. Patient returned to the [facility] ED on 02/05/2018 with a chief complaint of abdominal pain. Patient denies any suicidal ideations at this time. On call MD notified of previous psychiatric admission. Will continue to monitor.

## 2018-02-06 NOTE — Clinical Social Work Note (Signed)
Clinical Social Work Assessment  Patient Details  Name: Modelle Vollmer MRN: [id number] Date of Birth: 04-22-1928  Date of referral:  02/06/18               Reason for consult:  Facility Placement                Permission sought to share information with:  Case Manager, Customer service manager, Family Supports Permission granted to share information::  Yes, Verbal Permission Granted  Name::        Agency::     Relationship::     Contact Information:     Housing/Transportation Living arrangements for the past 2 months:  Group Home Source of Information:  Patient Patient Interpreter Needed:  None Criminal Activity/Legal Involvement Pertinent to Current Situation/Hospitalization:  No - Comment as needed Significant Relationships:  Friend Lives with:  Facility Resident Do you feel safe going back to the place where you live?  Yes Need for family participation in patient care:  Yes (Comment)  Care giving concerns:  Patient is from [facility].    Social Worker assessment / plan:  CSW consulted for facility placement. CSW met with patient. She states that she recently moved into a group home and plans to return there. CSW contacted group home owner Ivin Booty [phone] and she confirms that patient is from [facility] and can return. CSW will follow for discharge planning.   Employment status:  Retired Nurse, adult PT Recommendations:  Not assessed at this time Information / Referral to community resources:     Patient/Family's Response to care:  Patient thanked CSW for assistance   Patient/Family's Understanding of and Emotional Response to Diagnosis, Current Treatment, and Prognosis:  Patient in agreement with discharge plan   Emotional Assessment Appearance:  Appears stated age Attitude/Demeanor/Rapport:    Affect (typically observed):  Accepting, Quiet Orientation:  Oriented to Self, Oriented to Place Alcohol / Substance use:   Not Applicable Psych involvement (Current and /or in the community):  No (Comment)  Discharge Needs  Concerns to be addressed:  Discharge Planning Concerns Readmission within the last 30 days:  No Current discharge risk:  None Barriers to Discharge:  Continued Medical Work up   [employer], [place] 02/06/2018, 9:31 AM

## 2018-02-06 NOTE — Clinical Social Work Note (Signed)
Patient is medically ready for discharge today back to group home. CSW notified patient and group home director Flint Melter [phone]. Sandy Daniel states that she will pick up patient this afternoon when she gets off work. CSW prepared discharge packet and left on chart for Sandy Daniel to pick up with patient. CSW signing off. Please re consult if further needs arise.   Ruthe Mannan MSW, [address] [phone]

## 2018-02-06 NOTE — Evaluation (Signed)
Physical Therapy Evaluation Patient Details Name: Sandy Daniel MRN: [id number] DOB: 12-Oct-1928 Today's Date: 02/06/2018   History of Present Illness  84 yo female with onset of abd pain and noted pancreatitis and cardiomegaly was referred to PT for mobility ck.  Has been having HA's, chest pain, continues to smoke.  Her PMHx:  tremor, seizures, COPD, atherosclerosis, L THA, MI, CABG, macular degeneration, CHF, depression, B12 deficiency, asthma, OA, HTN, HLD, hypoxemia, cardiac stents,   Clinical Impression  Pt was seen for evaluation of mobility and used min guard for safety with her gait.  Pt is challenging to direct in that sheis HOH but can maneuver with min guard in her room with confined space and obstacles.  Anticipate ALF will need to offer this initially and HHPT set for follow up.  Discussed her needs with case manager and will continue acutely as her stay in hosp dictates to focus on strengthening of L hip from old surgical changes and to monitor and correct gait/balance.    Follow Up Recommendations Home health PT;Supervision for mobility/OOB    Equipment Recommendations  None recommended by PT(pt should have equipment she needs at home)    Recommendations for Other Services       Precautions / Restrictions Precautions Precautions: Fall Precaution Comments: has used RW at home Restrictions Weight Bearing Restrictions: No      Mobility  Bed Mobility Overal bed mobility: Needs Assistance Bed Mobility: Supine to Sit;Sit to Supine     Supine to sit: Min assist Sit to supine: Min guard   General bed mobility comments: no physical help to return to bed but did use min guard for safety  Transfers Overall transfer level: Needs assistance Equipment used: Rolling walker (2 wheeled);1 person hand held assist Transfers: Sit to/from Stand Sit to Stand: Min guard         General transfer comment: cues for safety with walker incl hand  placemetn  Ambulation/Gait Ambulation/Gait assistance: Min guard Gait Distance (Feet): 35 Feet Assistive device: Rolling walker (2 wheeled);1 person hand held assist Gait Pattern/deviations: Step-to pattern;Step-through pattern;Decreased stride length;Wide base of support Gait velocity: reduced Gait velocity interpretation: <1.31 ft/sec, indicative of household ambulator General Gait Details: pt tends to control pace of walking to make walk safer  Stairs            Wheelchair Mobility    Modified Rankin (Stroke Patients Only)       Balance Overall balance assessment: Needs assistance Sitting-balance support: Feet supported;Bilateral upper extremity supported Sitting balance-Leahy Scale: Fair     Standing balance support: Bilateral upper extremity supported;During functional activity Standing balance-Leahy Scale: Poor                               Pertinent Vitals/Pain Pain Assessment: Faces Faces Pain Scale: Hurts a little bit Pain Location: abdomen Pain Intervention(s): Monitored during session;Repositioned    Home Living Family/patient expects to be discharged to:: Assisted living               Home Equipment: Walker - 2 wheels;Walker - 4 wheels;Cane - single point;Grab bars - toilet Additional Comments: in accessible environment    Prior Function Level of Independence: Independent with assistive device(s)         Comments: Per pt uses rollater for ambulation in the home and community. Does not go out in the community except for doctors appointments. Does not drive. States she has been independent with  all ADL and IADL.     Hand Dominance   Dominant Hand: Right    Extremity/Trunk Assessment   Upper Extremity Assessment Upper Extremity Assessment: Generalized weakness    Lower Extremity Assessment Lower Extremity Assessment: Generalized weakness    Cervical / Trunk Assessment Cervical / Trunk Assessment: Kyphotic   Communication   Communication: HOH  Cognition Arousal/Alertness: Awake/alert Behavior During Therapy: WFL for tasks assessed/performed Overall Cognitive Status: Within Functional Limits for tasks assessed                                 General Comments: pt is aware with her recent hip fracture she should continue to use RW      General Comments General comments (skin integrity, edema, etc.): pt requires some supervised help but should have this in ALF    Exercises     Assessment/Plan    PT Assessment Patient needs continued PT services  PT Problem List Decreased strength;Decreased range of motion;Decreased activity tolerance;Decreased balance;Decreased mobility;Decreased coordination;Decreased knowledge of use of DME;Decreased safety awareness;Pain       PT Treatment Interventions DME instruction;Gait training;Functional mobility training;Therapeutic activities;Therapeutic exercise;Balance training;Neuromuscular re-education;Patient/family education    PT Goals (Current goals can be found in the Care Plan section)  Acute Rehab PT Goals Patient Stated Goal: to walk and be safe PT Goal Formulation: With patient Time For Goal Achievement: 02/20/18 Potential to Achieve Goals: Good    Frequency Min 2X/week   Barriers to discharge Other (comment) home with help and equipment    Co-evaluation               AM-PAC PT "6 Clicks" Mobility  Outcome Measure Help needed turning from your back to your side while in a flat bed without using bedrails?: A Little Help needed moving from lying on your back to sitting on the side of a flat bed without using bedrails?: A Little Help needed moving to and from a bed to a chair (including a wheelchair)?: A Little Help needed standing up from a chair using your arms (e.g., wheelchair or bedside chair)?: A Little Help needed to walk in hospital room?: A Little Help needed climbing 3-5 steps with a railing? : A Lot 6 Click  Score: 17    End of Session   Activity Tolerance: Patient limited by fatigue;Patient limited by pain Patient left: in bed;with call bell/phone within reach;with bed alarm set Nurse Communication: Mobility status PT Visit Diagnosis: Unsteadiness on feet (R26.81);Muscle weakness (generalized) (M62.81);Pain Pain - Right/Left: Left Pain - part of body: Hip    Time: [id number] PT Time Calculation (min) (ACUTE ONLY): 23 min   Charges:   PT Evaluation $PT Eval Moderate Complexity: 1 Mod PT Treatments $Gait Training: 8-22 mins        Ivar DrapeRuth E Coletta Lockner 02/06/2018, 11:37 AM   Samul Dadauth Sophie Tamez, PT MS Acute Rehab Dept. Number: [facility] [phone] and [facility] [phone]

## 2018-02-06 NOTE — [place] FL2 (Signed)
[place] MEDICAID FL2 LEVEL OF CARE SCREENING TOOL     IDENTIFICATION  Patient Name: Sandy Daniel Birthdate: 08-Feb-1929 Sex: female Admission Date (Current Location): 02/05/2018  [place] and [place] Number:  Chiropodist and Address:  [facility], [address], [place], [place] [address]      Provider Number: [id number]  Attending Physician Name and Address:  Adrian Saran, MD  Relative Name and Phone Number:       Current Level of Care: Hospital Recommended Level of Care: [facility] Prior Approval Number:    Date Approved/Denied:   PASRR Number:    Discharge Plan: Other (Comment)(Family Care Home )    Current Diagnoses: Patient Active Problem List   Diagnosis Date Noted  . Abdominal pain 02/05/2018  . Hip fracture (HCC) 12/01/2017  . COPD with acute exacerbation (HCC) 10/21/2017  . Cervical spondylosis without myelopathy 09/17/2017  . Disability of walking 09/17/2017  . Chronic pain syndrome 09/17/2017  . DDD (degenerative disc disease), cervical 09/17/2017  . Chest pain 09/07/2017  . 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 Major depressive disorder with single episode, in partial remission (HCC) 05/15/2017  . Neck pain 01/31/2017  . Chronic right shoulder pain 01/31/2017  . Chronic bilateral thoracic back pain 12/04/2016  . Muscle spasm of back 10/22/2016  . (HFpEF) heart failure with preserved ejection fraction (HCC) 09/11/2016  . Anxiety 07/16/2016  . Chronic bilateral low back pain 07/16/2016  . Unstable gait 07/16/2016  . Controlled substance agreement signed 03/27/2016  . Dysphagia 03/26/2016  . Panic disorder 03/26/2016  . CAD (coronary artery disease), native coronary artery   . Seizure disorder (HCC)   . Macular degeneration of both eyes   . B12 deficiency   . COPD (chronic obstructive pulmonary disease) (HCC)   . HTN (hypertension)   . Hyperlipidemia   . Arthritis   .  Depression   . GERD (gastroesophageal reflux disease)   . Physiological tremor 02/18/2014    Orientation RESPIRATION BLADDER Height & Weight     Self, Place  Normal Incontinent Weight: 146 lb 13.2 oz (66.6 kg) Height:  5' (152.4 cm)  BEHAVIORAL SYMPTOMS/MOOD NEUROLOGICAL BOWEL NUTRITION STATUS  (none) (none) Continent Diet(Soft diet )  AMBULATORY STATUS COMMUNICATION OF NEEDS Skin   Supervision Verbally Normal                       Personal Care Assistance Level of Assistance  Bathing, Feeding, Dressing Bathing Assistance: Limited assistance Feeding assistance: Independent Dressing Assistance: Independent     Functional Limitations Info  Sight, Hearing, Speech Sight Info: Adequate Hearing Info: Adequate Speech Info: Adequate    SPECIAL CARE FACTORS FREQUENCY                       Contractures Contractures Info: Not present    Additional Factors Info  Code Status, Allergies Code Status Info: DNR Allergies Info: Motrin Ibuprofen, Tolmetin, Alendronate, Other, Tramadol Hcl           Current Medications (02/06/2018):  This is the current hospital active medication list Current Facility-Administered Medications  Medication Dose Route Frequency Provider Last Rate Last Dose  . 0.9 %  sodium chloride infusion   Intravenous Continuous Oralia Manis, MD 75 mL/hr at 02/06/18 [phone]    . acetaminophen (TYLENOL) tablet 650 mg  650 mg Oral Q6H PRN Oralia Manis, MD       Or  .  acetaminophen (TYLENOL) suppository 650 mg  650 mg Rectal Q6H PRN Oralia ManisWillis, David, MD      . aspirin chewable tablet 81 mg  81 mg Oral Daily Oralia ManisWillis, David, MD      . atorvastatin (LIPITOR) tablet 10 mg  10 mg Oral Daily Oralia ManisWillis, David, MD      . enoxaparin (LOVENOX) injection 40 mg  40 mg Subcutaneous Q24H Oralia ManisWillis, David, MD      . furosemide (LASIX) tablet 10 mg  10 mg Oral Daily Oralia ManisWillis, David, MD      . gabapentin (NEURONTIN) capsule 800 mg  800 mg Oral TID Oralia ManisWillis, David, MD   800 mg at 02/06/18  0007  . MEDLINE mouth rinse  15 mL Mouth Rinse BID Oralia ManisWillis, David, MD      . morphine 2 MG/ML injection 2 mg  2 mg Intravenous Q4H PRN Oralia ManisWillis, David, MD      . ondansetron [facility](ZOFRAN) tablet 4 mg  4 mg Oral Q6H PRN Oralia ManisWillis, David, MD       Or  . ondansetron [facility](ZOFRAN) injection 4 mg  4 mg Intravenous Q6H PRN Oralia ManisWillis, David, MD      . oxyCODONE (Oxy IR/ROXICODONE) immediate release tablet 5 mg  5 mg Oral Q4H PRN Oralia ManisWillis, David, MD   5 mg at 02/06/18 0006  . pantoprazole (PROTONIX) EC tablet 40 mg  40 mg Oral Daily Oralia ManisWillis, David, MD      . phenytoin (DILANTIN) ER capsule 200 mg  200 mg Oral Lamont SnowballQHS Willis, David, MD   200 mg at 02/06/18 0007  . QUEtiapine (SEROQUEL) tablet 50 mg  50 mg Oral Lamont SnowballQHS Willis, David, MD   50 mg at 02/06/18 0007  . sertraline (ZOLOFT) tablet 100 mg  100 mg Oral Daily Oralia ManisWillis, David, MD      . traZODone (DESYREL) tablet 50 mg  50 mg Oral QHS PRN Oralia ManisWillis, David, MD         Discharge Medications: Please see discharge summary for a list of discharge medications.  Relevant Imaging Results:  Relevant Lab Results:   Additional Information    Ogden Handlin  Rinaldo RatelGarrison, [address]CSWA

## 2018-02-06 NOTE — Progress Notes (Signed)
   02/06/18 0800  Clinical Encounter Type  Visited With Patient not available (Patient was sleeping.)  Recommendations Chaplain will return later today.   OR request for prayer was received. Upon arrival, patient was sleeping. Nurse Cherylynn Ridges asked Chaplain to return later in the day.

## 2018-02-19 ENCOUNTER — Telehealth: Payer: Self-pay | Admitting: Nurse Practitioner

## 2018-02-19 NOTE — Telephone Encounter (Signed)
Sandy Daniel at  [facility] said pt was no longer there.  She moved to [place] with family.  Her call back number is [phone]

## 2018-02-24 ENCOUNTER — Inpatient Hospital Stay: Payer: Self-pay | Admitting: Nurse Practitioner

## 2018-04-18 ENCOUNTER — Telehealth: Payer: Self-pay | Admitting: Nurse Practitioner

## 2018-04-18 NOTE — Telephone Encounter (Signed)
Copied from CRM [id number]. Topic: Medicare AWV >> Apr 18, 2018 11:58 AM Earlyne Iba wrote: Jamal Maes to call Terressa Koyanagi on patient's DPR dated 06/18/2016, to schedule patient's Medicare Annual Wellness Visit with the Nurse Health Advisor.  No answer and no machine to leave a message.     If patient returns call, please note: their last AWV was on 01/08/2017, please schedule AWV with NHA any date AFTER 01/08/2018.  Thank you! For any questions please contact: Trixie Rude at [phone] or Skype [email or web address]

## 2018-04-18 NOTE — Telephone Encounter (Signed)
Copied from CRM [id number]. Topic: Medicare AWV >> Apr 18, 2018 11:37 AM Earlyne Iba wrote: Called to schedule Medicare Annual Wellness Visit with the Nurse Health Advisor. The lady that answered the phone states the patient doesn't live there any longer.  If patient returns a call, please note: their last AWV was on 01/08/2017, please schedule AWV-s with NHA any date AFTER 01/08/2018.  Thank you! For any questions please contact: Trixie Rude at [phone] or Skype [email or web address]

## 2019-12-13 IMAGING — CT CT HEAD W/O CM
3 series · 15 of 47 positions shown, 18 images · non-contrast
Comparison: 08/06/2017

CLINICAL DATA: Severe headache with emesis and generalized weakness
today. Ventricles, cisterns and other CSF spaces are normal. There
is no mass, mass effect, shift of midline structures or acute
hemorrhage.

EXAM:
CT HEAD WITHOUT CONTRAST
TECHNIQUE: Contiguous axial images were obtained from the base of the skull
through the vertex without intravenous contrast.

[Series 2: head wo · axial · 0.42mm/px · z∈[-106,+19]mm · 9 of 30 slices shown, 12 images]
[im 3/30  brain]
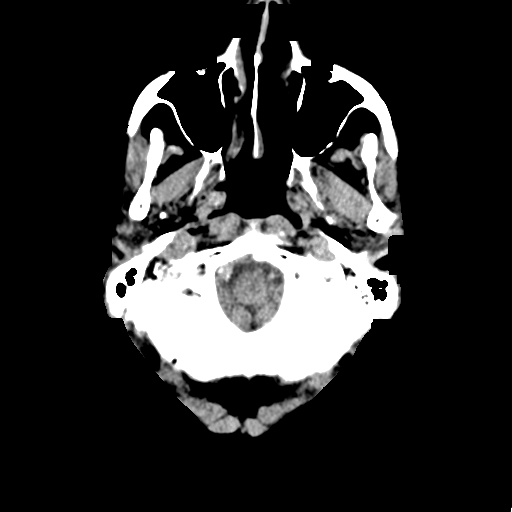
[im 3/30  bone]
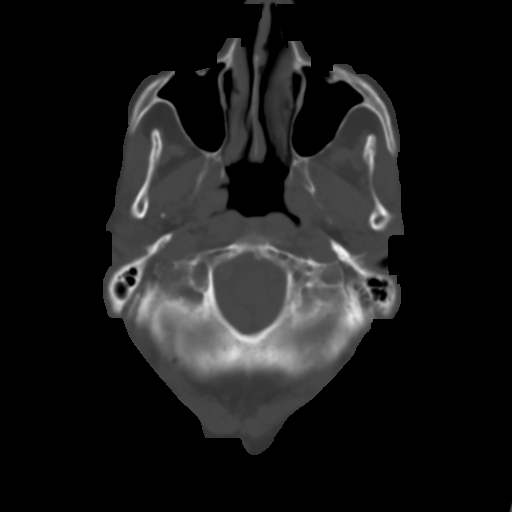
[im 6/30  brain]
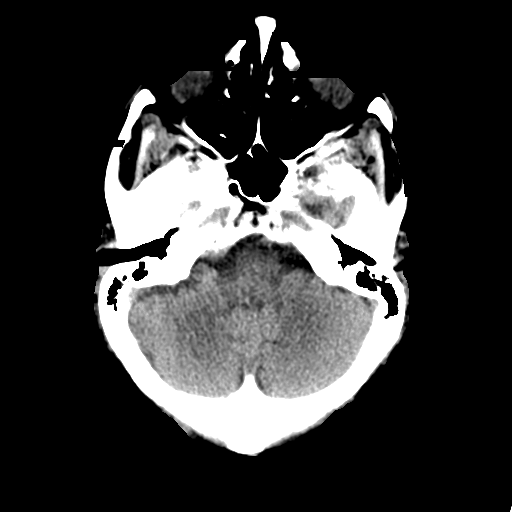
[im 9/30  brain]
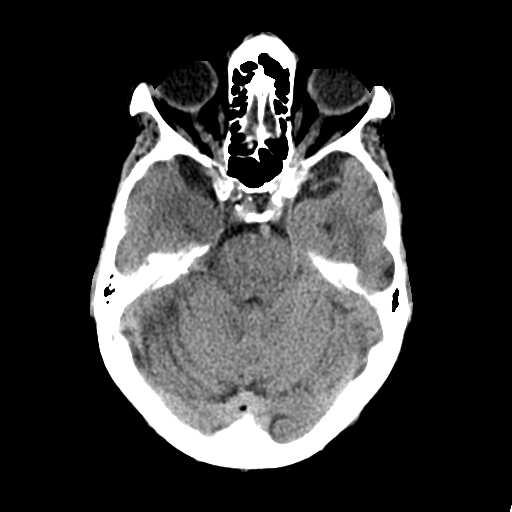
[im 12/30  brain]
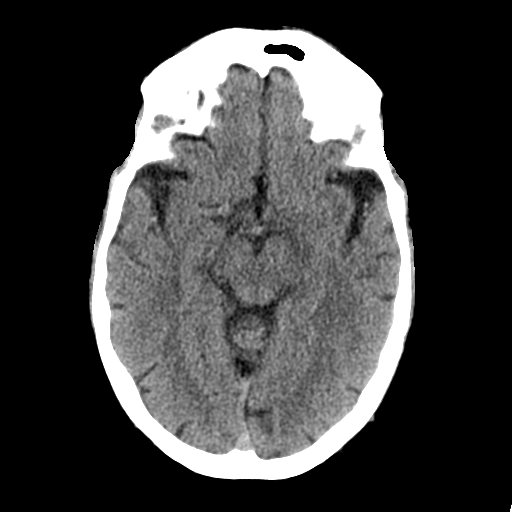
[im 16/30  brain]
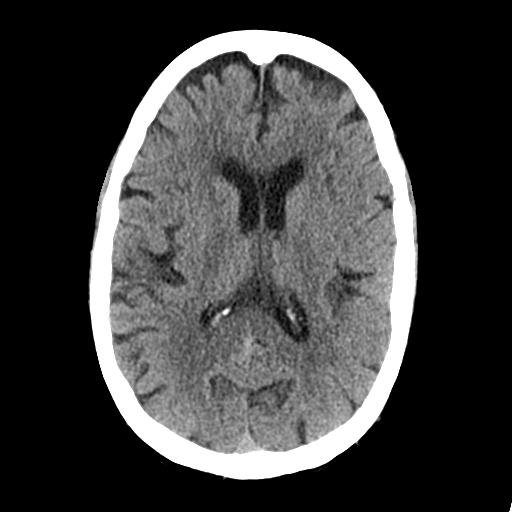
[im 16/30  bone]
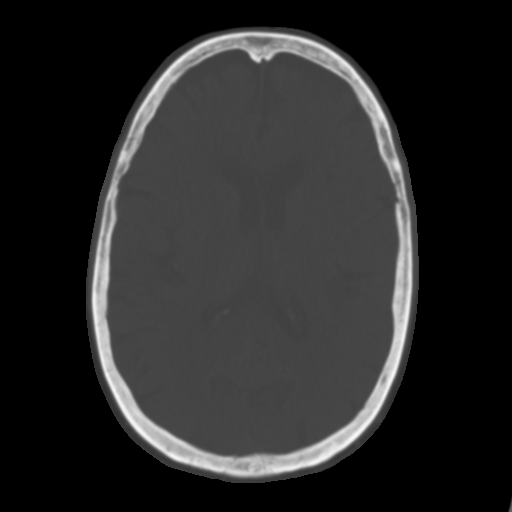
[im 19/30  brain]
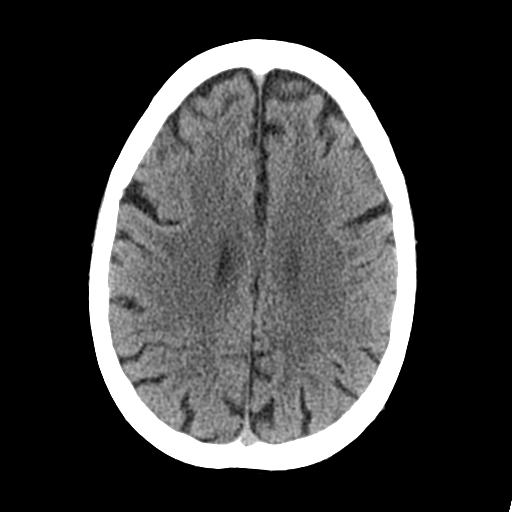
[im 22/30  brain]
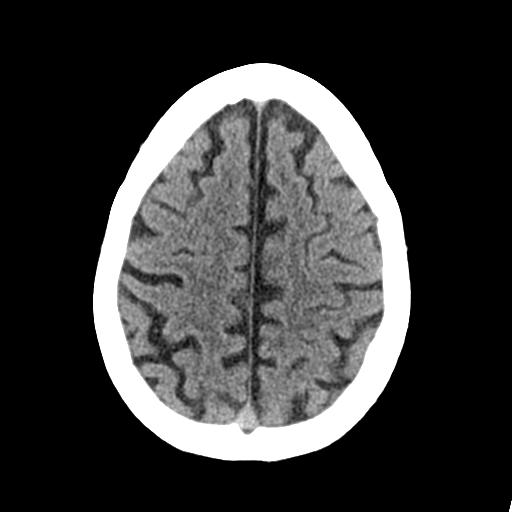
[im 25/30  brain]
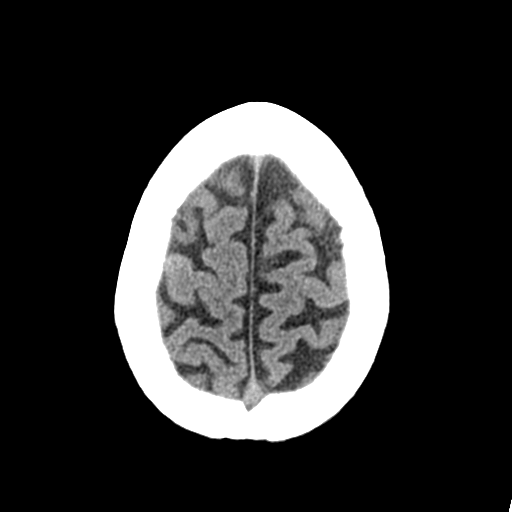
[im 28/30  brain]
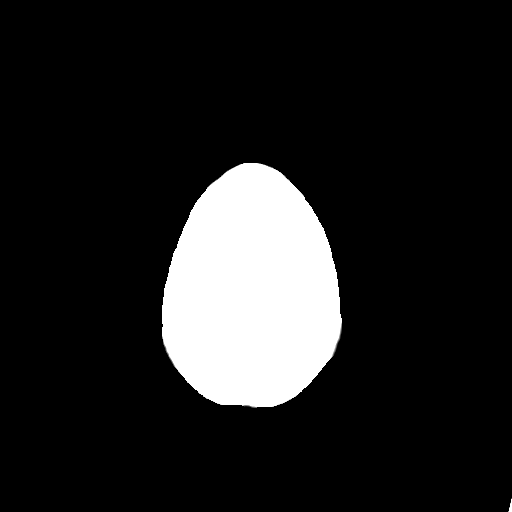
[im 28/30  bone]
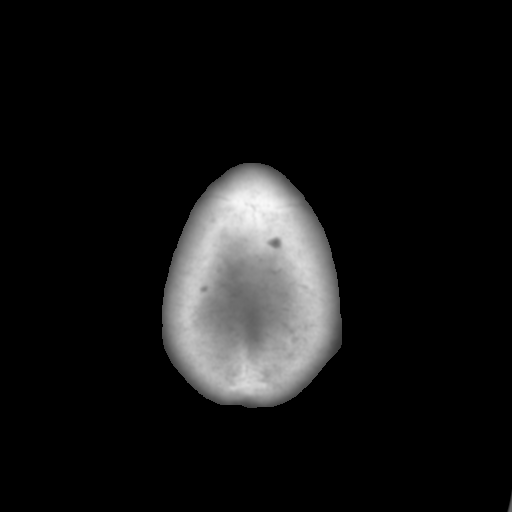

[Series 4: coronal soft tissue · coronal · 0.31mm/px · 3 of 67 slices shown]
[im 23/67  brain]
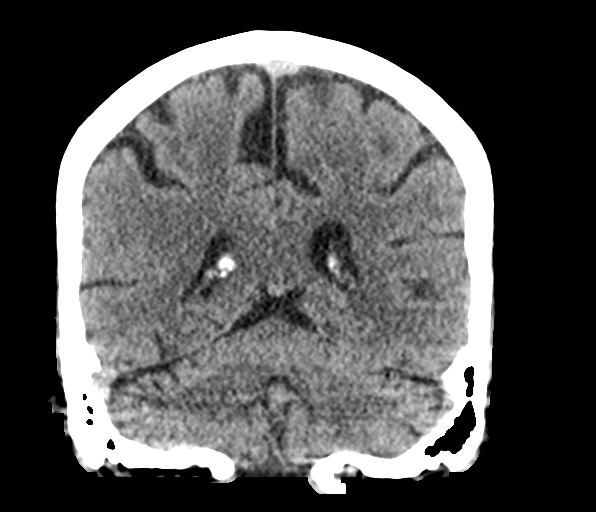
[im 30/67  brain]
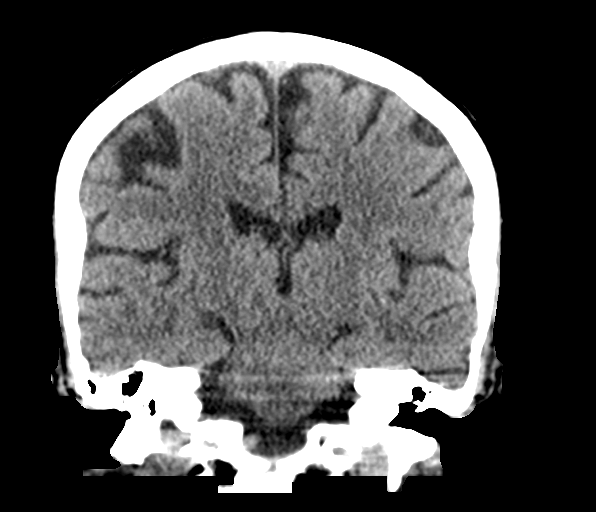
[im 37/67  brain]
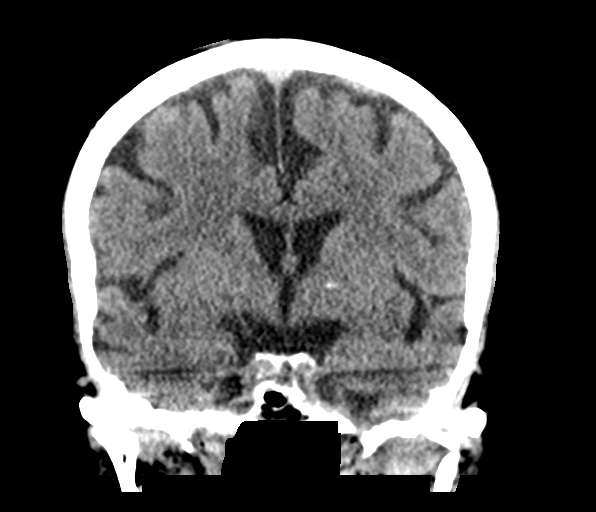

[Series 5: sagittal soft tissue · sagittal · 0.29mm/px · 3 of 49 slices shown]
[im 17/49  brain]
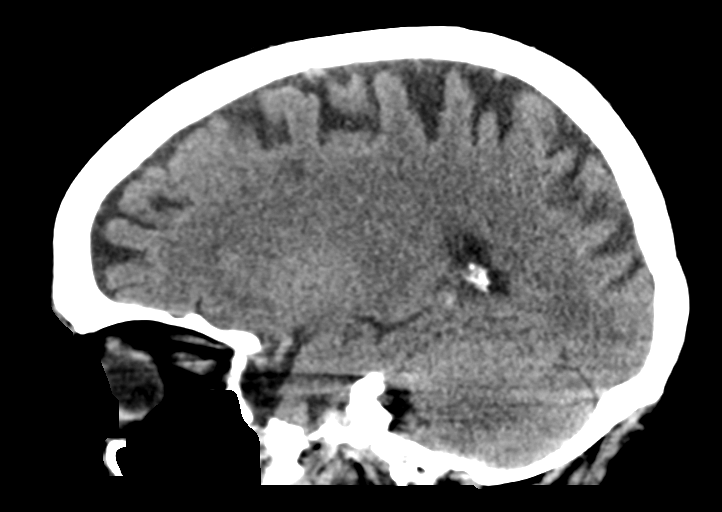
[im 25/49  brain]
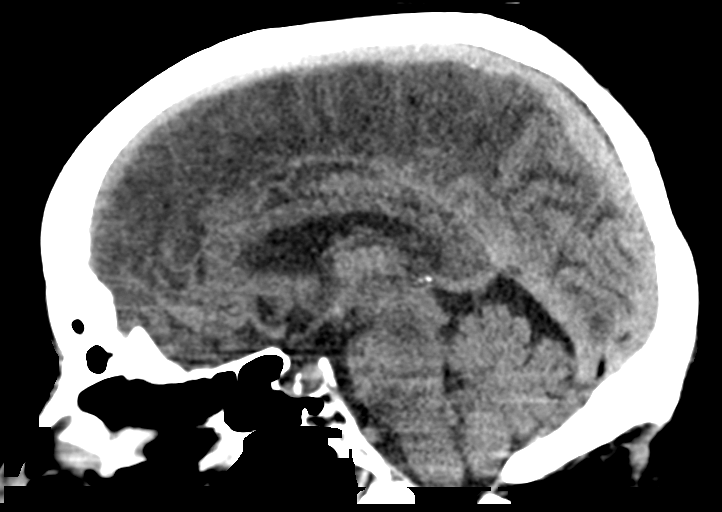
[im 33/49  brain]
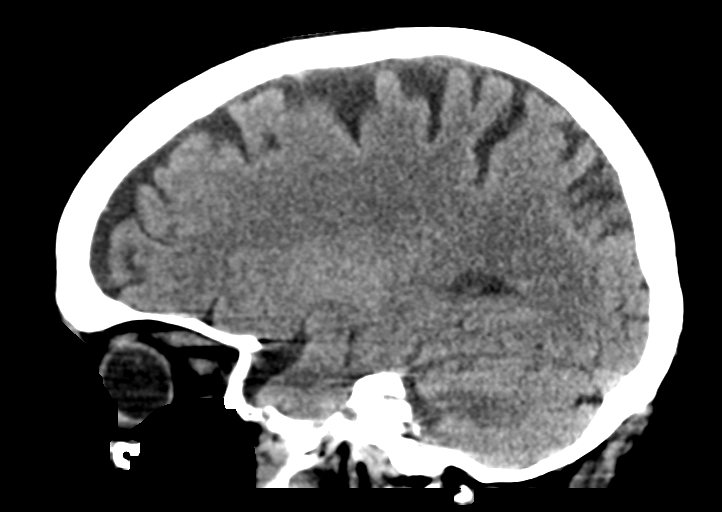

[15 of 47 positions shown; findings below may reference images not displayed]

FINDINGS: Brain: No evidence of acute infarction, hemorrhage, hydrocephalus,
extra-axial collection or mass lesion/mass effect. Subtle chronic
ischemic microvascular disease.

Vascular: No hyperdense vessel or unexpected calcification.

Skull: Normal. Negative for fracture or focal lesion.

Sinuses/Orbits: No acute finding.

Other: None.
IMPRESSION: No acute findings.

Subtle chronic ischemic microvascular disease.

## 2020-04-04 ENCOUNTER — Telehealth: Payer: Self-pay | Admitting: Cardiovascular Disease

## 2020-04-04 NOTE — Telephone Encounter (Signed)
3 attempts to schedule fu appt from recall list.   Deleting recall.   

## 2023-02-13 DEATH — deceased
